# Patient Record
Sex: Male | Born: 1960 | Race: Black or African American | Hispanic: No | Marital: Single | Smoking: Never smoker
Health system: Southern US, Community
[De-identification: ages and names within clinical notes are randomized; demographics above are authoritative.]

## PROBLEM LIST (undated history)

## (undated) DIAGNOSIS — D649 Anemia, unspecified: Secondary | ICD-10-CM

## (undated) DIAGNOSIS — N1831 Chronic kidney disease, stage 3a: Secondary | ICD-10-CM

## (undated) DIAGNOSIS — I6930 Unspecified sequelae of cerebral infarction: Secondary | ICD-10-CM

## (undated) DIAGNOSIS — F1410 Cocaine abuse, uncomplicated: Secondary | ICD-10-CM

---
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
## 2006-07-17 ENCOUNTER — Emergency Department (HOSPITAL_COMMUNITY): Admission: EM | Admit: 2006-07-17 | Discharge: 2006-07-17 | Payer: Self-pay | Admitting: Emergency Medicine

## 2006-07-17 ENCOUNTER — Inpatient Hospital Stay (HOSPITAL_COMMUNITY): Admission: AD | Admit: 2006-07-17 | Discharge: 2006-07-21 | Payer: Self-pay | Admitting: Orthopedic Surgery

## 2009-10-20 ENCOUNTER — Emergency Department (HOSPITAL_COMMUNITY): Admission: EM | Admit: 2009-10-20 | Discharge: 2009-10-20 | Payer: Self-pay | Admitting: Emergency Medicine

## 2010-08-24 NOTE — Op Note (Signed)
NAMEHANSFORD, HIRT NO.:  [id number]   MEDICAL RECORD NO.:  [id number]          PATIENT TYPE:  INP   LOCATION:  3031                         FACILITY:  [facility]   PHYSICIAN:  Lenard Galloway. Mortenson, M.D.DATE OF BIRTH:  1960-05-02   DATE OF PROCEDURE:  07/17/2006  DATE OF DISCHARGE:                               OPERATIVE REPORT   PREOPERATIVE DIAGNOSIS:  Fracture left patella.   POSTOPERATIVE DIAGNOSIS:  Fracture left patella.   OPERATION:  Open induction and internal fixation fracture left patella  using Kirschner wires and figure-of-eight tension band, reduction and  stabilization of fracture.   SURGEON:  Lenard Galloway. Chaney Malling, M.D.   ASSISTANT:  Arlys John D. Petrarca, P.A.-C.   ANESTHESIA:  General.   PROCEDURE:  The patient was placed on the operating table in the supine  position with a pneumatic tourniquet on the left upper thigh.  The  entire left lower extremity was prepped with DuraPrep and draped out in  the usual manner.  The leg was wrapped out in Esmarch and the tourniquet  was elevated.  An incision was started above the patella and carried  down to the tibial tubercle.  The skin edges were retracted.  The fascia  was opened.  The patella was clearly seen.  The patella had two major  fragments and a smaller fragment on the medial side.  The wound was  irrigated and blood clots were removed.  The joint, itself, was  examined.  No loose bodies were seen.  The small fragments on the medial  side were removed as they were too small to stabilize with fixation.  An  anatomic reduction could be done under direct visualization.  Two  Kirschner wires were then passed through the proximal patellar portion  retrograde out the top of the patella.  The patella was then reduced and  pins were passed distally to capture the distal patellar component.  Once this was accomplished, the surface of the posterior aspect of the  patella were smoothed and an anatomic  duction was achieved.  A 20 gauge  wire was then passed in figure-of-eight fashion around the Kirschner  wires and with a traction bow assembly, the figure-of-eight tension band  was tightened and knots were put in the suture.  An excellent reduction  with good stabilization and excellent fixation of the fracture was  achieved.  The Kirschner wires were then cut to about 8 mm in length and  then bent packed on themselves and captured with the figure-of-eight  tension band and these were buried into the soft tissue.  Excellent  stabilization of the fracture was achieved.  The wounds were irrigated  again.  The fascia was closed with 0 Vicryl and 0 Vicryl was used to  close the subcutaneous tissue and stainless steel staples were used to  close the skin.  Sterile dressings were applied and the patient returned  to the recovery room in excellent condition.  A knee immobilizer was  applied.  Technically, this procedure went extremely well.  Drains none.  Complications none.  I was very pleased with the  final outcome.           ______________________________  Lenard Galloway. Chaney Malling, M.D.    RAM/MEDQ  D:  07/19/2006  T:  07/19/2006  Job:  [id number]
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
## 2018-12-19 ENCOUNTER — Inpatient Hospital Stay (HOSPITAL_COMMUNITY): Payer: Medicaid Other

## 2018-12-19 ENCOUNTER — Other Ambulatory Visit: Payer: Self-pay

## 2018-12-19 ENCOUNTER — Emergency Department (HOSPITAL_COMMUNITY): Payer: Medicaid Other

## 2018-12-19 ENCOUNTER — Encounter (HOSPITAL_COMMUNITY): Payer: Self-pay

## 2018-12-19 ENCOUNTER — Inpatient Hospital Stay (HOSPITAL_COMMUNITY)
Admission: EM | Admit: 2018-12-19 | Discharge: 2019-06-14 | DRG: 917 | Disposition: A | Discharge status: Skilled Nursing Facility | Payer: Medicaid Other | Attending: Internal Medicine | Admitting: Internal Medicine

## 2018-12-19 DIAGNOSIS — N3091 Cystitis, unspecified with hematuria: Secondary | ICD-10-CM | POA: Diagnosis not present

## 2018-12-19 DIAGNOSIS — Z66 Do not resuscitate: Secondary | ICD-10-CM

## 2018-12-19 DIAGNOSIS — K921 Melena: Secondary | ICD-10-CM | POA: Diagnosis not present

## 2018-12-19 DIAGNOSIS — E1122 Type 2 diabetes mellitus with diabetic chronic kidney disease: Secondary | ICD-10-CM | POA: Diagnosis present

## 2018-12-19 DIAGNOSIS — R29703 NIHSS score 3: Secondary | ICD-10-CM | POA: Diagnosis present

## 2018-12-19 DIAGNOSIS — Z7189 Other specified counseling: Secondary | ICD-10-CM | POA: Diagnosis not present

## 2018-12-19 DIAGNOSIS — G8191 Hemiplegia, unspecified affecting right dominant side: Secondary | ICD-10-CM | POA: Diagnosis present

## 2018-12-19 DIAGNOSIS — Z515 Encounter for palliative care: Secondary | ICD-10-CM

## 2018-12-19 DIAGNOSIS — I739 Peripheral vascular disease, unspecified: Secondary | ICD-10-CM | POA: Diagnosis present

## 2018-12-19 DIAGNOSIS — R4702 Dysphasia: Secondary | ICD-10-CM | POA: Diagnosis present

## 2018-12-19 DIAGNOSIS — R531 Weakness: Secondary | ICD-10-CM

## 2018-12-19 DIAGNOSIS — F1490 Cocaine use, unspecified, uncomplicated: Secondary | ICD-10-CM

## 2018-12-19 DIAGNOSIS — E872 Acidosis: Secondary | ICD-10-CM | POA: Diagnosis not present

## 2018-12-19 DIAGNOSIS — Z590 Homelessness: Secondary | ICD-10-CM

## 2018-12-19 DIAGNOSIS — F339 Major depressive disorder, recurrent, unspecified: Secondary | ICD-10-CM

## 2018-12-19 DIAGNOSIS — N485 Ulcer of penis: Secondary | ICD-10-CM | POA: Diagnosis not present

## 2018-12-19 DIAGNOSIS — F919 Conduct disorder, unspecified: Secondary | ICD-10-CM | POA: Diagnosis not present

## 2018-12-19 DIAGNOSIS — F1410 Cocaine abuse, uncomplicated: Secondary | ICD-10-CM | POA: Diagnosis present

## 2018-12-19 DIAGNOSIS — R130 Aphagia: Secondary | ICD-10-CM | POA: Diagnosis present

## 2018-12-19 DIAGNOSIS — I63322 Cerebral infarction due to thrombosis of left anterior cerebral artery: Secondary | ICD-10-CM | POA: Diagnosis present

## 2018-12-19 DIAGNOSIS — Z20822 Contact with and (suspected) exposure to covid-19: Secondary | ICD-10-CM | POA: Diagnosis present

## 2018-12-19 DIAGNOSIS — G252 Other specified forms of tremor: Secondary | ICD-10-CM | POA: Diagnosis present

## 2018-12-19 DIAGNOSIS — I452 Bifascicular block: Secondary | ICD-10-CM | POA: Diagnosis present

## 2018-12-19 DIAGNOSIS — I129 Hypertensive chronic kidney disease with stage 1 through stage 4 chronic kidney disease, or unspecified chronic kidney disease: Secondary | ICD-10-CM | POA: Diagnosis present

## 2018-12-19 DIAGNOSIS — Z659 Problem related to unspecified psychosocial circumstances: Secondary | ICD-10-CM

## 2018-12-19 DIAGNOSIS — R109 Unspecified abdominal pain: Secondary | ICD-10-CM

## 2018-12-19 DIAGNOSIS — N1831 Chronic kidney disease, stage 3a: Secondary | ICD-10-CM | POA: Diagnosis present

## 2018-12-19 DIAGNOSIS — Z751 Person awaiting admission to adequate facility elsewhere: Secondary | ICD-10-CM

## 2018-12-19 DIAGNOSIS — D631 Anemia in chronic kidney disease: Secondary | ICD-10-CM | POA: Diagnosis present

## 2018-12-19 DIAGNOSIS — I63312 Cerebral infarction due to thrombosis of left middle cerebral artery: Secondary | ICD-10-CM | POA: Diagnosis present

## 2018-12-19 DIAGNOSIS — R9431 Abnormal electrocardiogram [ECG] [EKG]: Secondary | ICD-10-CM | POA: Diagnosis present

## 2018-12-19 DIAGNOSIS — L89890 Pressure ulcer of other site, unstageable: Secondary | ICD-10-CM | POA: Diagnosis not present

## 2018-12-19 DIAGNOSIS — I63522 Cerebral infarction due to unspecified occlusion or stenosis of left anterior cerebral artery: Secondary | ICD-10-CM

## 2018-12-19 DIAGNOSIS — Y92239 Unspecified place in hospital as the place of occurrence of the external cause: Secondary | ICD-10-CM | POA: Diagnosis not present

## 2018-12-19 DIAGNOSIS — D509 Iron deficiency anemia, unspecified: Secondary | ICD-10-CM

## 2018-12-19 DIAGNOSIS — N179 Acute kidney failure, unspecified: Secondary | ICD-10-CM | POA: Diagnosis present

## 2018-12-19 DIAGNOSIS — R471 Dysarthria and anarthria: Secondary | ICD-10-CM | POA: Diagnosis present

## 2018-12-19 DIAGNOSIS — T8383XA Hemorrhage of genitourinary prosthetic devices, implants and grafts, initial encounter: Secondary | ICD-10-CM | POA: Diagnosis not present

## 2018-12-19 DIAGNOSIS — E441 Mild protein-calorie malnutrition: Secondary | ICD-10-CM | POA: Diagnosis not present

## 2018-12-19 DIAGNOSIS — T405X1A Poisoning by cocaine, accidental (unintentional), initial encounter: Secondary | ICD-10-CM | POA: Diagnosis present

## 2018-12-19 DIAGNOSIS — I6523 Occlusion and stenosis of bilateral carotid arteries: Secondary | ICD-10-CM | POA: Diagnosis present

## 2018-12-19 DIAGNOSIS — E785 Hyperlipidemia, unspecified: Secondary | ICD-10-CM | POA: Diagnosis present

## 2018-12-19 DIAGNOSIS — L309 Dermatitis, unspecified: Secondary | ICD-10-CM | POA: Diagnosis not present

## 2018-12-19 DIAGNOSIS — M6281 Muscle weakness (generalized): Secondary | ICD-10-CM | POA: Diagnosis not present

## 2018-12-19 DIAGNOSIS — R339 Retention of urine, unspecified: Secondary | ICD-10-CM | POA: Clinically undetermined

## 2018-12-19 DIAGNOSIS — Z5320 Procedure and treatment not carried out because of patient's decision for unspecified reasons: Secondary | ICD-10-CM | POA: Diagnosis not present

## 2018-12-19 DIAGNOSIS — R140 Abdominal distension (gaseous): Secondary | ICD-10-CM | POA: Diagnosis present

## 2018-12-19 DIAGNOSIS — R509 Fever, unspecified: Secondary | ICD-10-CM

## 2018-12-19 DIAGNOSIS — D62 Acute posthemorrhagic anemia: Secondary | ICD-10-CM | POA: Diagnosis not present

## 2018-12-19 DIAGNOSIS — R319 Hematuria, unspecified: Secondary | ICD-10-CM

## 2018-12-19 DIAGNOSIS — I10 Essential (primary) hypertension: Secondary | ICD-10-CM

## 2018-12-19 DIAGNOSIS — M62838 Other muscle spasm: Secondary | ICD-10-CM | POA: Diagnosis not present

## 2018-12-19 DIAGNOSIS — Z6841 Body Mass Index (BMI) 40.0 and over, adult: Secondary | ICD-10-CM | POA: Diagnosis not present

## 2018-12-19 DIAGNOSIS — Y846 Urinary catheterization as the cause of abnormal reaction of the patient, or of later complication, without mention of misadventure at the time of the procedure: Secondary | ICD-10-CM | POA: Diagnosis not present

## 2018-12-19 DIAGNOSIS — F329 Major depressive disorder, single episode, unspecified: Secondary | ICD-10-CM | POA: Diagnosis not present

## 2018-12-19 DIAGNOSIS — Z7401 Bed confinement status: Secondary | ICD-10-CM

## 2018-12-19 DIAGNOSIS — E7800 Pure hypercholesterolemia, unspecified: Secondary | ICD-10-CM | POA: Diagnosis present

## 2018-12-19 DIAGNOSIS — K5900 Constipation, unspecified: Secondary | ICD-10-CM | POA: Diagnosis not present

## 2018-12-19 DIAGNOSIS — K429 Umbilical hernia without obstruction or gangrene: Secondary | ICD-10-CM | POA: Diagnosis present

## 2018-12-19 DIAGNOSIS — R4781 Slurred speech: Secondary | ICD-10-CM | POA: Diagnosis present

## 2018-12-19 DIAGNOSIS — D638 Anemia in other chronic diseases classified elsewhere: Secondary | ICD-10-CM | POA: Diagnosis present

## 2018-12-19 DIAGNOSIS — W1830XA Fall on same level, unspecified, initial encounter: Secondary | ICD-10-CM | POA: Diagnosis present

## 2018-12-19 DIAGNOSIS — T83518A Infection and inflammatory reaction due to other urinary catheter, initial encounter: Secondary | ICD-10-CM | POA: Diagnosis not present

## 2018-12-19 DIAGNOSIS — Z7289 Other problems related to lifestyle: Secondary | ICD-10-CM

## 2018-12-19 DIAGNOSIS — T45515A Adverse effect of anticoagulants, initial encounter: Secondary | ICD-10-CM | POA: Diagnosis not present

## 2018-12-19 DIAGNOSIS — R1084 Generalized abdominal pain: Secondary | ICD-10-CM

## 2018-12-19 DIAGNOSIS — I6330 Cerebral infarction due to thrombosis of unspecified cerebral artery: Secondary | ICD-10-CM | POA: Diagnosis not present

## 2018-12-19 DIAGNOSIS — R7989 Other specified abnormal findings of blood chemistry: Secondary | ICD-10-CM

## 2018-12-19 DIAGNOSIS — N390 Urinary tract infection, site not specified: Secondary | ICD-10-CM

## 2018-12-19 DIAGNOSIS — I959 Hypotension, unspecified: Secondary | ICD-10-CM | POA: Diagnosis not present

## 2018-12-19 DIAGNOSIS — I639 Cerebral infarction, unspecified: Secondary | ICD-10-CM

## 2018-12-19 DIAGNOSIS — R3911 Hesitancy of micturition: Secondary | ICD-10-CM

## 2018-12-19 LAB — DIFFERENTIAL
Abs Immature Granulocytes: 0.05 10*3/uL (ref 0.00–0.07)
Basophils Absolute: 0 10*3/uL (ref 0.0–0.1)
Basophils Relative: 0 %
Eosinophils Absolute: 0 10*3/uL (ref 0.0–0.5)
Eosinophils Relative: 0 %
Immature Granulocytes: 1 %
Lymphocytes Relative: 21 %
Lymphs Abs: 1.8 10*3/uL (ref 0.7–4.0)
Monocytes Absolute: 0.5 10*3/uL (ref 0.1–1.0)
Monocytes Relative: 6 %
Neutro Abs: 6.3 10*3/uL (ref 1.7–7.7)
Neutrophils Relative %: 72 %

## 2018-12-19 LAB — CBC
HCT: 43.3 % (ref 39.0–52.0)
Hemoglobin: 14.7 g/dL (ref 13.0–17.0)
MCH: 30.1 pg (ref 26.0–34.0)
MCHC: 33.9 g/dL (ref 30.0–36.0)
MCV: 88.5 fL (ref 80.0–100.0)
Platelets: 228 10*3/uL (ref 150–400)
RBC: 4.89 MIL/uL (ref 4.22–5.81)
RDW: 13 % (ref 11.5–15.5)
WBC: 8.7 10*3/uL (ref 4.0–10.5)
nRBC: 0 % (ref 0.0–0.2)

## 2018-12-19 LAB — ETHANOL: Alcohol, Ethyl (B): <10 mg/dL (ref <10)

## 2018-12-19 LAB — I-STAT CHEM 8, ED
BUN: 20 mg/dL (ref 6–20)
Calcium, Ion: 1.16 mmol/L (ref 1.15–1.40)
Chloride: 113 mmol/L — ABNORMAL HIGH (ref 98–111)
Creatinine, Ser: 1.9 mg/dL — ABNORMAL HIGH (ref 0.61–1.24)
Glucose, Bld: 102 mg/dL — ABNORMAL HIGH (ref 70–99)
HCT: 42 % (ref 39.0–52.0)
Hemoglobin: 14.3 g/dL (ref 13.0–17.0)
Potassium: 4 mmol/L (ref 3.5–5.1)
Sodium: 143 mmol/L (ref 135–145)
TCO2: 21 mmol/L — ABNORMAL LOW (ref 22–32)

## 2018-12-19 LAB — PROTIME-INR
INR: 1 (ref 0.8–1.2)
Prothrombin Time: 12.7 seconds (ref 11.4–15.2)

## 2018-12-19 LAB — URINALYSIS, ROUTINE W REFLEX MICROSCOPIC
Bilirubin Urine: NEGATIVE
Glucose, UA: NEGATIVE mg/dL
Hgb urine dipstick: NEGATIVE
Ketones, ur: NEGATIVE mg/dL
Leukocytes,Ua: NEGATIVE
Nitrite: NEGATIVE
Protein, ur: NEGATIVE mg/dL
Specific Gravity, Urine: 1.014 (ref 1.005–1.030)
pH: 5 (ref 5.0–8.0)

## 2018-12-19 LAB — COMPREHENSIVE METABOLIC PANEL
ALT: 35 U/L (ref 0–44)
AST: 34 U/L (ref 15–41)
Albumin: 4 g/dL (ref 3.5–5.0)
Alkaline Phosphatase: 68 U/L (ref 38–126)
Anion gap: 9 (ref 5–15)
BUN: 17 mg/dL (ref 6–20)
CO2: 21 mmol/L — ABNORMAL LOW (ref 22–32)
Calcium: 9.3 mg/dL (ref 8.9–10.3)
Chloride: 112 mmol/L — ABNORMAL HIGH (ref 98–111)
Creatinine, Ser: 1.84 mg/dL — ABNORMAL HIGH (ref 0.61–1.24)
GFR calc Af Amer: 46 mL/min — ABNORMAL LOW (ref >60)
GFR calc non Af Amer: 40 mL/min — ABNORMAL LOW (ref >60)
Glucose, Bld: 105 mg/dL — ABNORMAL HIGH (ref 70–99)
Potassium: 4 mmol/L (ref 3.5–5.1)
Sodium: 142 mmol/L (ref 135–145)
Total Bilirubin: 0.6 mg/dL (ref 0.3–1.2)
Total Protein: 7.2 g/dL (ref 6.5–8.1)

## 2018-12-19 LAB — APTT: aPTT: 24 seconds (ref 24–36)

## 2018-12-19 LAB — SARS CORONAVIRUS 2 BY RT PCR (HOSPITAL ORDER, PERFORMED IN ~~LOC~~ HOSPITAL LAB): SARS Coronavirus 2: NEGATIVE

## 2018-12-19 LAB — RAPID URINE DRUG SCREEN, HOSP PERFORMED
Amphetamines: NOT DETECTED
Barbiturates: NOT DETECTED
Benzodiazepines: NOT DETECTED
Cocaine: POSITIVE — AB
Opiates: NOT DETECTED
Tetrahydrocannabinol: NOT DETECTED

## 2018-12-19 LAB — LIPID PANEL
Cholesterol: 273 mg/dL — ABNORMAL HIGH (ref 0–200)
HDL: 57 mg/dL (ref >40)
LDL Cholesterol: 199 mg/dL — ABNORMAL HIGH (ref 0–99)
Total CHOL/HDL Ratio: 4.8 RATIO
Triglycerides: 84 mg/dL (ref <150)
VLDL: 17 mg/dL (ref 0–40)

## 2018-12-19 LAB — TROPONIN I (HIGH SENSITIVITY)
Troponin I (High Sensitivity): 45 ng/L — ABNORMAL HIGH (ref <18)
Troponin I (High Sensitivity): 35 ng/L — ABNORMAL HIGH (ref <18)

## 2018-12-19 LAB — CBG MONITORING, ED: Glucose-Capillary: 92 mg/dL (ref 70–99)

## 2018-12-19 MED ORDER — ENOXAPARIN SODIUM 40 MG/0.4ML ~~LOC~~ SOLN
40.0000 mg | SUBCUTANEOUS | Status: DC
  Administered 2018-12-20: 40 mg via SUBCUTANEOUS
  Filled 2018-12-19: qty 0.4

## 2018-12-19 MED ORDER — ROSUVASTATIN CALCIUM 20 MG PO TABS: 20.0000 mg | ORAL_TABLET | Freq: Every day | ORAL | Status: DC

## 2018-12-19 MED ORDER — LACTATED RINGERS IV BOLUS
1000.0000 mL | Freq: Once | INTRAVENOUS | Status: AC
  Administered 2018-12-19: 1000 mL via INTRAVENOUS

## 2018-12-19 MED ORDER — ACETAMINOPHEN 325 MG PO TABS
650.0000 mg | ORAL_TABLET | Freq: Four times a day (QID) | ORAL | Status: DC | PRN
  Administered 2018-12-20 – 2019-04-12 (×20): 650 mg via ORAL
  Filled 2018-12-19 (×21): qty 2

## 2018-12-19 MED ORDER — LACTATED RINGERS IV SOLN
INTRAVENOUS | Status: DC
  Administered 2018-12-19 – 2018-12-20 (×3): via INTRAVENOUS

## 2018-12-19 MED ORDER — ASPIRIN EC 81 MG PO TBEC
81.0000 mg | DELAYED_RELEASE_TABLET | Freq: Every day | ORAL | Status: DC
  Administered 2018-12-20 – 2019-01-05 (×17): 81 mg via ORAL
  Filled 2018-12-19 (×17): qty 1

## 2018-12-19 MED ORDER — ASPIRIN 325 MG PO TABS
325.0000 mg | ORAL_TABLET | Freq: Once | ORAL | Status: AC
  Administered 2018-12-19: 325 mg via ORAL
  Filled 2018-12-19: qty 1

## 2018-12-19 MED ORDER — ACETAMINOPHEN 650 MG RE SUPP: 650.0000 mg | Freq: Four times a day (QID) | RECTAL | Status: DC | PRN

## 2018-12-19 NOTE — ED Notes (Signed)
Patient transported to X-ray 

## 2018-12-19 NOTE — ED Triage Notes (Signed)
Pt from home via ems; found on floor of home by fmaily member ; claims symptom onset 3 am, LSN 12 am; c/o L sided weakness, difficulty standing, difficulty holding left arm steady; minor intemittent aphasia; denies medical hx; pt states he stumbled to floor, denies head, neck back pain; denies hitting head, no loc; LVO 3 with EMS; not on thinners; a and o x 4  CBG 104 197/121 02 95 RA HR 85 97.5 F

## 2018-12-19 NOTE — ED Notes (Signed)
ED TO INPATIENT HANDOFF REPORT  ED Nurse Name and Phone #:   S Name/Age/Gender Iona CoachLeroy Brannigan 58 y.o. male Room/Bed: 013C/013C  Code Status   Code Status: Full Code  Home/SNF/Other Home Patient oriented to: self, place, time and situation Is this baseline? No   Triage Complete: Triage complete  Chief Complaint weakness  Triage Note Pt from home via ems; found on floor of home by fmaily member ; claims symptom onset 3 am, LSN 12 am; c/o L sided weakness, difficulty standing, difficulty holding left arm steady; minor intemittent aphasia; denies medical hx; pt states he stumbled to floor, denies head, neck back pain; denies hitting head, no loc; LVO 3 with EMS; not on thinners; a and o x 4  CBG 104 197/121 02 95 RA HR 85 97.5 F   Allergies No Known Allergies  Level of Care/Admitting Diagnosis ED Disposition    ED Disposition Condition Comment   Admit  [facility] [[id number]]  Level of Care: Telemetry Medical [104]  Covid Evaluation: Asymptomatic Screening Protocol (No Symptoms)  Diagnosis: Acute right-sided weakness [191478][[id number]]  Admitting Physician: Nena PolioMULLEN, EMILY B [[id number]]  Attending Physician: Nena PolioMULLEN, EMILY B [[id number]]  Estimated length of stay: past midnight tomorrow  Certification:: I certify this patient will need inpatient services for at least 2 midnights  PT Class (Do Not Modify): Inpatient [101]  PT Acc Code (Do Not Modify): Private [1]       B Medical/Surgery History History reviewed. No pertinent past medical history. History reviewed. No pertinent surgical history.   A IV Location/Drains/Wounds Patient Lines/Drains/Airways Status   Active Line/Drains/Airways    Name:   Placement date:   Placement time:   Site:   Days:   Peripheral IV 12/19/18 Left Antecubital   12/19/18    -    Antecubital   less than 1          Intake/Output Last 24 hours  Intake/Output Summary (Last 24 hours) at 12/19/2018 1814 Last data filed at  12/19/2018 1518 Gross per 24 hour  Intake 1000 ml  Output -  Net 1000 ml    Labs/Imaging Results for orders placed or performed during the hospital encounter of 12/19/18 (from the past 48 hour(s))  Protime-INR     Status: None   Collection Time: 12/19/18  8:07 AM  Result Value Ref Range   Prothrombin Time 12.7 11.4 - 15.2 seconds   INR 1.0 0.8 - 1.2    Comment: (NOTE) INR goal varies based on device and disease states. Performed at [facility], [address] [place], [place] [address]   APTT     Status: None   Collection Time: 12/19/18  8:07 AM  Result Value Ref Range   aPTT 24 24 - 36 seconds    Comment: Performed at [facility], [address] [place], [place] [address]  CBC     Status: None   Collection Time: 12/19/18  8:07 AM  Result Value Ref Range   WBC 8.7 4.0 - 10.5 K/uL   RBC 4.89 4.22 - 5.81 MIL/uL   Hemoglobin 14.7 13.0 - 17.0 g/dL   HCT 57.843.3 46.939.0 - 62.952.0 %   MCV 88.5 80.0 - 100.0 fL   MCH 30.1 26.0 - 34.0 pg   MCHC 33.9 30.0 - 36.0 g/dL   RDW 52.813.0 41.311.5 - 24.415.5 %   Platelets 228 150 - 400 K/uL   nRBC 0.0 0.0 - 0.2 %    Comment:  Performed at [facility], [address] [place], [place] [address]  Differential     Status: None   Collection Time: 12/19/18  8:07 AM  Result Value Ref Range   Neutrophils Relative % 72 %   Neutro Abs 6.3 1.7 - 7.7 K/uL   Lymphocytes Relative 21 %   Lymphs Abs 1.8 0.7 - 4.0 K/uL   Monocytes Relative 6 %   Monocytes Absolute 0.5 0.1 - 1.0 K/uL   Eosinophils Relative 0 %   Eosinophils Absolute 0.0 0.0 - 0.5 K/uL   Basophils Relative 0 %   Basophils Absolute 0.0 0.0 - 0.1 K/uL   Immature Granulocytes 1 %   Abs Immature Granulocytes 0.05 0.00 - 0.07 K/uL    Comment: Performed at [facility], [address], [place], [place] [address]  Comprehensive metabolic panel     Status: Abnormal   Collection Time: 12/19/18  8:07 AM  Result Value Ref Range   Sodium 142 135 - 145 mmol/L   Potassium 4.0 3.5 -  5.1 mmol/L   Chloride 112 (H) 98 - 111 mmol/L   CO2 21 (L) 22 - 32 mmol/L   Glucose, Bld 105 (H) 70 - 99 mg/dL   BUN 17 6 - 20 mg/dL   Creatinine, Ser 0.63 (H) 0.61 - 1.24 mg/dL   Calcium 9.3 8.9 - 01.6 mg/dL   Total Protein 7.2 6.5 - 8.1 g/dL   Albumin 4.0 3.5 - 5.0 g/dL   AST 34 15 - 41 U/L   ALT 35 0 - 44 U/L   Alkaline Phosphatase 68 38 - 126 U/L   Total Bilirubin 0.6 0.3 - 1.2 mg/dL   GFR calc non Af Amer 40 (L) >60 mL/min   GFR calc Af Amer 46 (L) >60 mL/min   Anion gap 9 5 - 15    Comment: Performed at [facility], [address], [place], [place] [address]  CBG monitoring, ED     Status: None   Collection Time: 12/19/18  8:08 AM  Result Value Ref Range   Glucose-Capillary 92 70 - 99 mg/dL  I-stat chem 8, ED     Status: Abnormal   Collection Time: 12/19/18  8:15 AM  Result Value Ref Range   Sodium 143 135 - 145 mmol/L   Potassium 4.0 3.5 - 5.1 mmol/L   Chloride 113 (H) 98 - 111 mmol/L   BUN 20 6 - 20 mg/dL   Creatinine, Ser 2.35 (H) 0.61 - 1.24 mg/dL   Glucose, Bld 573 (H) 70 - 99 mg/dL   Calcium, Ion 2.20 2.54 - 1.40 mmol/L   TCO2 21 (L) 22 - 32 mmol/L   Hemoglobin 14.3 13.0 - 17.0 g/dL   HCT 27.0 62.3 - 76.2 %  SARS Coronavirus 2 [facility] order, Performed in [facility] hospital lab) Nasopharyngeal Nasopharyngeal Swab     Status: None   Collection Time: 12/19/18 10:25 AM   Specimen: Nasopharyngeal Swab  Result Value Ref Range   SARS Coronavirus 2 NEGATIVE NEGATIVE    Comment: (NOTE) If result is NEGATIVE SARS-CoV-2 target nucleic acids are NOT DETECTED. The SARS-CoV-2 RNA is generally detectable in upper and lower  respiratory specimens during the acute phase of infection. The lowest  concentration of SARS-CoV-2 viral copies this assay can detect is 250  copies / mL. A negative result does not preclude SARS-CoV-2 infection  and should not be used as the sole basis for treatment or other  patient management decisions.  A negative result may occur with   improper specimen collection / handling, submission of specimen other  than nasopharyngeal swab, presence of viral mutation(s) within the  areas targeted by this assay, and inadequate number of viral copies  (<250 copies / mL). A negative result must be combined with clinical  observations, patient history, and epidemiological information. If result is POSITIVE SARS-CoV-2 target nucleic acids are DETECTED. The SARS-CoV-2 RNA is generally detectable in upper and lower  respiratory specimens dur ing the acute phase of infection.  Positive  results are indicative of active infection with SARS-CoV-2.  Clinical  correlation with patient history and other diagnostic information is  necessary to determine patient infection status.  Positive results do  not rule out bacterial infection or co-infection with other viruses. If result is PRESUMPTIVE POSTIVE SARS-CoV-2 nucleic acids MAY BE PRESENT.   A presumptive positive result was obtained on the submitted specimen  and confirmed on repeat testing.  While 2019 novel coronavirus  (SARS-CoV-2) nucleic acids may be present in the submitted sample  additional confirmatory testing may be necessary for epidemiological  and / or clinical management purposes  to differentiate between  SARS-CoV-2 and other Sarbecovirus currently known to infect humans.  If clinically indicated additional testing with an alternate test  methodology [phone]) is advised. The SARS-CoV-2 RNA is generally  detectable in upper and lower respiratory sp ecimens during the acute  phase of infection. The expected result is Negative. Fact Sheet for Patients:  BoilerBrush.com.cy Fact Sheet for Healthcare Providers: https://pope.com/ This test is not yet approved or cleared by the [place] FDA and has been authorized for detection and/or diagnosis of SARS-CoV-2 by FDA under an Emergency Use Authorization (EUA).  This EUA will  remain in effect (meaning this test can be used) for the duration of the COVID-19 declaration under Section 564(b)(1) of the Act, 21 U.S.C. section 360bbb-3(b)(1), unless the authorization is terminated or revoked sooner. Performed at [facility], [address] [place], [place] [address]   Urine rapid drug screen (hosp performed)     Status: Abnormal   Collection Time: 12/19/18 10:42 AM  Result Value Ref Range   Opiates NONE DETECTED NONE DETECTED   Cocaine POSITIVE (A) NONE DETECTED   Benzodiazepines NONE DETECTED NONE DETECTED   Amphetamines NONE DETECTED NONE DETECTED   Tetrahydrocannabinol NONE DETECTED NONE DETECTED   Barbiturates NONE DETECTED NONE DETECTED    Comment: (NOTE) DRUG SCREEN FOR MEDICAL PURPOSES ONLY.  IF CONFIRMATION IS NEEDED FOR ANY PURPOSE, NOTIFY LAB WITHIN 5 DAYS. LOWEST DETECTABLE LIMITS FOR URINE DRUG SCREEN Drug Class                     Cutoff (ng/mL) Amphetamine and metabolites    1000 Barbiturate and metabolites    200 Benzodiazepine                 200 Tricyclics and metabolites     300 Opiates and metabolites        300 Cocaine and metabolites        300 THC                            50 Performed at [facility], [address] [place], [place] [address]   Urinalysis, Routine w reflex microscopic     Status: None   Collection Time: 12/19/18 10:43 AM  Result Value Ref Range   Color,  Urine YELLOW YELLOW   APPearance CLEAR CLEAR   Specific Gravity, Urine 1.014 1.005 - 1.030   pH 5.0 5.0 - 8.0   Glucose, UA NEGATIVE NEGATIVE mg/dL   Hgb urine dipstick NEGATIVE NEGATIVE   Bilirubin Urine NEGATIVE NEGATIVE   Ketones, ur NEGATIVE NEGATIVE mg/dL   Protein, ur NEGATIVE NEGATIVE mg/dL   Nitrite NEGATIVE NEGATIVE   Leukocytes,Ua NEGATIVE NEGATIVE    Comment: Performed at [facility], [address] [place], [place] [address]   Dg Abd 1 View  Result Date: 12/19/2018 CLINICAL DATA:  Acute abdominal pain and distension.  EXAM: ABDOMEN - 1 VIEW COMPARISON:  None. FINDINGS: The bowel gas pattern is unremarkable. No dilated bowel loops are present. No suspicious calcifications are noted. Calcifications in the anatomic pelvis likely represent phleboliths. IMPRESSION: Unremarkable bowel gas pattern.  No acute abnormality. Electronically Signed   By: Harmon PierJeffrey  Hu M.D.   On: 12/19/2018 15:08   Ct Head Wo Contrast  Result Date: 12/19/2018 CLINICAL DATA:  58 year old male with right-sided weakness. Found down on the floor by family member. EXAM: CT HEAD WITHOUT CONTRAST TECHNIQUE: Contiguous axial images were obtained from the base of the skull through the vertex without intravenous contrast. COMPARISON:  None. FINDINGS: Brain: Patchy and confluent areas of decreased attenuation are noted throughout the deep and periventricular white matter of the cerebral hemispheres bilaterally, compatible with chronic microvascular ischemic disease. No evidence of acute infarction, hemorrhage, hydrocephalus, extra-axial collection or mass lesion/mass effect. Vascular: No hyperdense vessel or unexpected calcification. Skull: Normal. Negative for fracture or focal lesion. Sinuses/Orbits: No acute finding. Small mucosal retention cyst or polyp in the posterior aspect of the left maxillary sinus. Other: None. IMPRESSION: 1. No acute intracranial abnormalities. 2. Chronic microvascular ischemic changes in the cerebral white matter, as above. Electronically Signed   By: Trudie Reedaniel  Entrikin M.D.   On: 12/19/2018 09:41    Pending Labs Unresulted Labs (From admission, onward)    Start     Ordered   12/20/18 0500  Basic metabolic panel  Tomorrow morning,   R     12/19/18 1140   12/20/18 0500  CBC  Tomorrow morning,   R     12/19/18 1140   12/19/18 1421  Lipid panel  Once,   STAT     12/19/18 1421   12/19/18 1421  Hemoglobin A1c  Once,   STAT     12/19/18 1421   12/19/18 1139  HIV antibody (Routine Testing)  Once,   STAT     12/19/18 1140   12/19/18  0756  Ethanol  ONCE - STAT,   STAT     12/19/18 0755          Vitals/Pain Today's Vitals   12/19/18 1100 12/19/18 1145 12/19/18 1400 12/19/18 1500  BP: (!) 177/107 (!) 147/115 (!) 152/130 (!) 184/117  Pulse: 78 73 71 73  Resp: 19 (!) 23 12 (!) 21  Temp:      TempSrc:      SpO2: 98% 96% 92% 99%  Weight:      Height:      PainSc:        Isolation Precautions No active isolations  Medications Medications  enoxaparin (LOVENOX) injection 40 mg (has no administration in time range)  acetaminophen (TYLENOL) tablet 650 mg (has no administration in time range)    Or  acetaminophen (TYLENOL) suppository 650 mg (has no administration in time range)  aspirin tablet 325 mg (325 mg Oral Given 12/19/18 1209)  Followed by  aspirin EC tablet 81 mg (has no administration in time range)  lactated ringers bolus 1,000 mL (0 mLs Intravenous Stopped 12/19/18 1518)    Followed by  lactated ringers infusion ( Intravenous New Bag/Given 12/19/18 1519)  rosuvastatin (CRESTOR) tablet 20 mg (has no administration in time range)    Mobility walks Low fall risk   Focused Assessments     R Recommendations: See Admitting Provider Note  Report given to:   Additional Notes:

## 2018-12-19 NOTE — ED Notes (Signed)
Patient transported to CT 

## 2018-12-19 NOTE — Consult Note (Addendum)
NEURO HOSPITALIST  CONSULT   Requesting Physician: Dr. Criselda PeachesMullen    Chief Complaint: left side weakness  History obtained from:  Patient    HPI:                                                                                                                                         Iona CoachLeroy Eveland is an 58 y.o. male with no PMH who presented to [facility] ED via EMS for  left side weakness and numbness.  Patient was in his usual state of health at midnight when he went to bed.  Admits to doing cocaine last night.  About 0300 he woke up and got out of bed, at this time he noted some weakness and fell to the floor. He stayed on the floor for about 2 hours before he could get assistance to get up. EMS helped him up. Per patient he could not get off the floor, but somehow when his mom called ( he lives with his mother), he was able to get to the phone. He told her that he was on the floor and she called EMS.  Per EMS patient had difficulty standing and some speech problems with left sided weakness. Patient denies hitting head, loss of consciousness anticoagulation use, dizziness, room spinning, tingling, vision changes. Patient endorses smoking 3 crack rocks per day, and drinking 3 beers per day. Denies tobacco abuse and no other drug use. No prior stroke history. Denies family history of stroke. Denies DM, HTN, cardiac problems, CP, SOB.   ED course:  CTH: no hemorrhage UDS; + cocaine BG: 102 BP: 147/115 UA: neagative Creatinine: 1.9     Date last known well: 12/19/2018 Time last known well: 0000 tPA Given: {no; outside of window Modified Rankin: Rankin Score=0 NIHSS:3, right leg   History reviewed. No pertinent past medical history.  History reviewed. No pertinent surgical history.  No family history on file.       Social History:  reports that he has never smoked. He has never used smokeless tobacco. He reports current alcohol use. He reports that  he does not use drugs.  Allergies: No Known Allergies  Medications:  Scheduled: . aspirin  325 mg Oral Once   Followed by  . [START ON 12/20/2018] aspirin EC  81 mg Oral Daily  . [START ON 12/20/2018] enoxaparin (LOVENOX) injection  40 mg Subcutaneous Q24H   Continuous: . lactated ringers     Followed by  . lactated ringers     [id number] **OR** acetaminophen  ROS:                                                                                                                                       ROS was performed and is negative except as noted in HPI  General Examination:                                                                                                      Blood pressure (!) 147/115, pulse 73, temperature 99 F (37.2 C), temperature source Oral, resp. rate (!) 23, height 5\' 9"  (1.753 m), weight 102.1 kg, SpO2 96 %.  HEENT-  Normocephalic, no lesions, without obvious abnormality.  Normal external eye and conjunctiva. Cardiovascular- S1-S2 audible, pulses palpable throughout  Lungs-no rhonchi or wheezing noted, no excessive working breathing.  Saturations within normal limits on RA Abdomen- All 4 quadrants palpated and non tender Extremities- Warm, dry and intact Musculoskeletal-no joint tenderness, deformity or swelling Skin-warm and dry, no hyperpigmentation, vitiligo, or suspicious lesions  Neurological Examination Mental Status: drowsy, oriented, thought content appropriate. Naming intact Speech: dysarthria without evidence of aphasia.  Able to follow  commands without difficulty. Cranial Nerves: II: Visual fields grossly normal,  III,IV, VI: ptosis not present, extra-ocular motions intact bilaterally, pupils equal, round, reactive to light and accommodation V,VII: smile symmetric, facial light touch sensation normal  bilaterally VIII: hearing normal bilaterally IX,X: uvula rises midline XI: bilateral shoulder shrug XII: midline tongue extension Motor: Right : Upper extremity   5/5  Left:     Upper extremity   5/5  Lower extremity   2/5   Lower extremity   5/5 Right side is weaker than left. Tone and bulk:normal tone throughout; no atrophy noted Sensory:  light touch intact throughout, bilaterally Deep Tendon Reflexes: 2+ and symmetric biceps and patella Cerebellar: normal finger-to-nose,HTS on left leg non ataxic. Unable to perform with right leg Gait: deferred   Lab Results: Basic Metabolic Panel: Recent Labs  Lab 12/19/18 0807 12/19/18 0815  NA 142 143  K 4.0 4.0  CL 112* 113*  CO2 21*  --   GLUCOSE 105* 102*  BUN  17 20  CREATININE 1.84* 1.90*  CALCIUM 9.3  --     CBC: Recent Labs  Lab 12/19/18 0807 12/19/18 0815  WBC 8.7  --   NEUTROABS 6.3  --   HGB 14.7 14.3  HCT 43.3 42.0  MCV 88.5  --   PLT 228  --     CBG: Recent Labs  Lab 12/19/18 0808  GLUCAP 92    Imaging: Ct Head Wo Contrast  Result Date: 12/19/2018 CLINICAL DATA:  58 year old male with right-sided weakness. Found down on the floor by family member. EXAM: CT HEAD WITHOUT CONTRAST TECHNIQUE: Contiguous axial images were obtained from the base of the skull through the vertex without intravenous contrast. COMPARISON:  None. FINDINGS: Brain: Patchy and confluent areas of decreased attenuation are noted throughout the deep and periventricular white matter of the cerebral hemispheres bilaterally, compatible with chronic microvascular ischemic disease. No evidence of acute infarction, hemorrhage, hydrocephalus, extra-axial collection or mass lesion/mass effect. Vascular: No hyperdense vessel or unexpected calcification. Skull: Normal. Negative for fracture or focal lesion. Sinuses/Orbits: No acute finding. Small mucosal retention cyst or polyp in the posterior aspect of the left maxillary sinus. Other: None. IMPRESSION:  1. No acute intracranial abnormalities. 2. Chronic microvascular ischemic changes in the cerebral white matter, as above. Electronically Signed   By: Vinnie Langton M.D.   On: 12/19/2018 09:41     Laurey Morale, MSN, NP-C [facility] [phone]  12/19/2018, 12:03 PM   Attending physician note to follow with Assessment and plan .   Attending addendum Patient seen and examined Concern for right leg weakness is what brought him into the ER. Positive for cocaine on the urinary toxicology screen Small vessel stroke versus toxic metabolic encephalopathy from drug use Has many risk factors for stroke-needs work-up. CT head with no acute changes.  I have personally reviewed the CT scan.  Assessment: 58 y.o. male with no PMH who presented to [facility] ED via EMS for  left side weakness and numbness. CTH no hemorrhage. On exam patient unable to  Move right leg off the bed, but states weakness and numbness is on the left side. TPA: not given presented outside the window.NIHSS 3, admit for complete stroke work up.  Stroke Risk Factors - none       Recommendations: -- BP goal : Permissive HTN upto 220/110 mmHg (for 24-48 post admission )  --MRI Brain  --CTA  --Echocardiogram -- Prophylactic therapy-Antiplatelet med -- High intensity Statin if LDL > 70 -- HgbA1c, fasting lipid panel -- PT consult, OT consult, Speech consult --Telemetry monitoring --Frequent neuro checks --Stroke swallow screen   --please page stroke NP  Or  PA  Or MD from 8am -4 pm  as this patient from this time will be  followed by the stroke.   You can look them up on [email or web address]  Password [email or web address]  -- Amie Portland, MD [facility] Pager: [phone] If 7pm to 7am, please call on call as listed on AMION.

## 2018-12-19 NOTE — ED Provider Notes (Signed)
[facility] EMERGENCY DEPARTMENT Provider Note   CSN: [id number] Arrival date & time: 12/19/18  0745     History   Chief Complaint No chief complaint on file.   HPI Philip Vasquez is a 58 y.o. male.     58 year old male who presents with weakness.  Patient was in his usual state of health when he went to sleep around midnight.  This morning at 3 AM he woke up and got out of bed, noted weakness and fell to the ground.  He laid there for a while before he was able to get assistance.  He reportedly had difficulty standing and EMS reported left-sided weakness and some intermittent speech problems.  He denied any head injury or loss of consciousness.  He denies any anticoagulant use.  Patient denies any drug or alcohol problems and denies any recent illness.  The history is provided by the patient.    History reviewed. No pertinent past medical history.  There are no active problems to display for this patient.   History reviewed. No pertinent surgical history.      Home Medications    Prior to Admission medications   Not on File    Family History No family history on file.  Social History Social History   Tobacco Use  . Smoking status: Never Smoker  . Smokeless tobacco: Never Used  Substance Use Topics  . Alcohol use: Yes    Comment: 2 cans of beer (natural lite)/day  . Drug use: Never     Allergies   Patient has no known allergies.   Review of Systems Review of Systems All other systems reviewed and are negative except that which was mentioned in HPI   Physical Exam Updated Vital Signs BP (!) 177/107   Pulse 78   Temp 99 F (37.2 C) (Oral)   Resp 19   Ht 5\' 9"  (1.753 m)   Wt 102.1 kg   SpO2 98%   BMI 33.23 kg/m   Physical Exam Vitals signs and nursing note reviewed.  Constitutional:      General: He is not in acute distress.    Appearance: He is well-developed.     Comments: Sleepy but arousable  HENT:     Head:  Normocephalic and atraumatic.  Eyes:     Extraocular Movements: Extraocular movements intact.     Conjunctiva/sclera: Conjunctivae normal.     Pupils: Pupils are equal, round, and reactive to light.  Neck:     Musculoskeletal: Neck supple.  Cardiovascular:     Rate and Rhythm: Normal rate and regular rhythm.     Heart sounds: Normal heart sounds. No murmur.  Pulmonary:     Effort: Pulmonary effort is normal. No respiratory distress.     Breath sounds: Normal breath sounds.  Abdominal:     General: Bowel sounds are normal. There is no distension.     Palpations: Abdomen is soft.     Tenderness: There is no abdominal tenderness.  Skin:    General: Skin is warm and dry.  Neurological:     Mental Status: He is alert and oriented to person, place, and time.     Cranial Nerves: No cranial nerve deficit.     Motor: No abnormal muscle tone.     Deep Tendon Reflexes: Reflexes are normal and symmetric.     Comments: No obvious facial asymmetry and fluent speech, proper naming of objects and no obvious aphasia, normal finger-to-nose testing, negative pronator drift, no clonus  4/5 strength RUE, RLE 5/5 strength LUE, LLE  Psychiatric:        Thought Content: Thought content normal.        Judgment: Judgment normal.      ED Treatments / Results  Labs (all labs ordered are listed, but only abnormal results are displayed) Labs Reviewed  COMPREHENSIVE METABOLIC PANEL - Abnormal; Notable for the following components:      Result Value   Chloride 112 (*)    CO2 21 (*)    Glucose, Bld 105 (*)    Creatinine, Ser 1.84 (*)    GFR calc non Af Amer 40 (*)    GFR calc Af Amer 46 (*)    All other components within normal limits  I-STAT CHEM 8, ED - Abnormal; Notable for the following components:   Chloride 113 (*)    Creatinine, Ser 1.90 (*)    Glucose, Bld 102 (*)    TCO2 21 (*)    All other components within normal limits  SARS CORONAVIRUS 2 (HOSPITAL ORDER, PERFORMED IN [place]  [facility])  PROTIME-INR  APTT  CBC  DIFFERENTIAL  URINALYSIS, ROUTINE W REFLEX MICROSCOPIC  ETHANOL  RAPID URINE DRUG SCREEN, HOSP PERFORMED  CBG MONITORING, ED    EKG EKG Interpretation  Date/Time:  Saturday December 19 2018 07:51:42 EDT Ventricular Rate:  82 PR Interval:    QRS Duration: 137 QT Interval:  429 QTC Calculation: 502 R Axis:   -89 Text Interpretation:  Sinus rhythm LAE, consider biatrial enlargement RBBB and LAFB ST elevation suggests acute pericarditis similar to previous Confirmed by Frederick PeersLittle, [place] [phone]([id number]) on 12/19/2018 8:45:59 AM   Radiology Ct Head Wo Contrast  Result Date: 12/19/2018 CLINICAL DATA:  [age] year old male with right-sided weakness. Found down on the floor by family member. EXAM: CT HEAD WITHOUT CONTRAST TECHNIQUE: Contiguous axial images were obtained from the base of the skull through the vertex without intravenous contrast. COMPARISON:  None. FINDINGS: Brain: Patchy and confluent areas of decreased attenuation are noted throughout the deep and periventricular white matter of the cerebral hemispheres bilaterally, compatible with chronic microvascular ischemic disease. No evidence of acute infarction, hemorrhage, hydrocephalus, extra-axial collection or mass lesion/mass effect. Vascular: No hyperdense vessel or unexpected calcification. Skull: Normal. Negative for fracture or focal lesion. Sinuses/Orbits: No acute finding. Small mucosal retention cyst or polyp in the posterior aspect of the left maxillary sinus. Other: None. IMPRESSION: 1. No acute intracranial abnormalities. 2. Chronic microvascular ischemic changes in the cerebral white matter, as above. Electronically Signed   By: Trudie Reedaniel  Entrikin M.D.   On: 12/19/2018 09:41    Procedures Procedures (including critical care time)  Medications Ordered in ED Medications - No data to display   Initial Impression / Assessment and Plan / ED Course  I have reviewed the triage vital signs and the  nursing notes.  Pertinent labs & imaging results that were available during my care of the patient were reviewed by me and considered in my medical decision making (see chart for details).       Pt comfortable on exam, hypertensive.  I appreciated right-sided weakness, not left despite EMSs report of left weakness.  Last seen normal was midnight therefore outside window for TPA.  His lab work shows creatinine of 1.9, unclear whether this is acute or chronic especially in the setting of elevated blood pressure here.  Although he denied substance use, UDS is positive for cocaine.  Head CT negative acute but does have chronic microvascular ischemic changes.  Discussed with neurologist Dr. Malen Gauze who will see the patient in consultation and agreed with plan to admit for stroke work-up.  Discussed with internal medicine teaching service and patient admitted for further care.  Final Clinical Impressions(s) / ED Diagnoses   Final diagnoses:  Right sided weakness  Hypertension, unspecified type    ED Discharge Orders    None       Amberrose Friebel, Wenda Overland, MD 12/19/18 1126

## 2018-12-19 NOTE — ED Notes (Signed)
Pt returns from ct scan, remains on monitor. No needs from pt at this time.

## 2018-12-19 NOTE — H&P (Addendum)
Date: 12/19/2018               Patient Name:  Philip Vasquez MRN: [id number]  DOB: 04/29/1960 Age / Sex: 58 y.o., male   PCP: Patient, No Pcp Per         Medical Service: Internal Medicine Teaching Service         Attending Physician: Dr. Inez CatalinaMullen, Emily B, MD    First Contact: Dr. Lyn HollingsheadAlexander Pager: [phone]  Second Contact: Dr. Crista ElliotHarbrecht Pager: [phone]       After Hours (After 5p/  First Contact Pager: [phone]  weekends / holidays): Second Contact Pager: [phone]   Chief Complaint: AMS   History of Present Illness: [name] is a 58 year old gentleman with no past medical history who presented with AMS and R sided weakness. The patient says he woke up at 3 AM today and tried to get up from his bed to use the bathroom and noticed he could not stand. He fell on the floor and his mother called [phone] when he could not stand up. His weakness has not improved or worsened since that time. Patient also notes that he is slurring his speech. Nothing like this is ever happened before. He denies hitting his head or losing consciousness when he fell this morning. Patient did admit that he used cocaine last night and uses it on a daily basis. Denies having fevers, visual changes, headache, numbness or tingling, chest pain, shortness of breath, changes in his bowel or bladder habits.  In the ED, the patient underwent a head CT which did not show any acute intracranial abnormalities but was notable for chronic microvascular ischemic changes.  CBC, BMP, urinalysis, and UDS were obtained. All of are unremarkable for the exception of an elevated creatinine to 1.84 (BL unknown) and UDS which was positive for cocaine.  Meds: No outpatient medications have been marked as taking for the 12/19/18 encounter [facility](Hospital Encounter).   Allergies: Allergies as of 12/19/2018  . (No Known Allergies)   Surgical History: - L patellar fracture s/p fixation 6 years ago  Family History:  Denies family history of stroke  or heart attacks  Social History:  - Patient currently lives with his mother at home. - Does not currently work. - Smokes 3 crack rocks a day. - Drinks 2-3 beers a day.  Denies tobacco use  Review of Systems: A complete ROS was negative except as per HPI.   Physical Exam: Blood pressure (!) 147/115, pulse 73, temperature 99 F (37.2 C), temperature source Oral, resp. rate (!) 23, height 5\' 9"  (1.753 m), weight 102.1 kg, SpO2 96 %.  Physical Exam Vitals signs reviewed.  Constitutional:      General: He is not in acute distress.    Appearance: He is diaphoretic.  HENT:     Head: Normocephalic and atraumatic.     Nose: No rhinorrhea.     Mouth/Throat:     Mouth: Mucous membranes are moist.     Pharynx: No oropharyngeal exudate or posterior oropharyngeal erythema.  Eyes:     General:        Right eye: No discharge.        Left eye: No discharge.     Extraocular Movements: Extraocular movements intact.     Pupils: Pupils are equal, round, and reactive to light.     Comments: Bilateral scleral injection  Cardiovascular:     Rate and Rhythm: Normal rate and regular rhythm.     Pulses: Normal  pulses.     Heart sounds: Normal heart sounds. No murmur. No friction rub. No gallop.   Pulmonary:     Effort: Pulmonary effort is normal. No respiratory distress.     Breath sounds: Normal breath sounds. No wheezing or rales.  Abdominal:     General: Bowel sounds are normal. There is distension.     Tenderness: There is no abdominal tenderness. There is no guarding or rebound.     Hernia: A hernia (small reducible periumbilical hernia) is present.  Musculoskeletal:     Right lower leg: No edema.     Left lower leg: No edema.     Comments: RUE: 3/5 strength RLE: 2/5 strength LUE: 5/5 strength LLE: 5/5 strength   Skin:    General: Skin is warm.  Neurological:     Mental Status: He is alert. He is disoriented.     Cranial Nerves: Cranial nerve deficit (R sided cranial nerve XI.  All  other cranial nerves intact) present.     Sensory: No sensory deficit.     Comments: Somnolent, and intermittently responds to questions with one word answers.  Follows commands when awake    CT Head: IMPRESSION: 1. No acute intracranial abnormalities. 2. Chronic microvascular ischemic changes in the cerebral white matter, as above.  EKG:  Sinus rhythm LAE, consider biatrial enlargement RBBB and LAFB Probable anterolateral infarct, acute ST elevation, consider inferior injury >>> Acute MI <<<  Assessment & Plan by Problem: Active Problems:   Acute right-sided weakness  In summary [name] is a 58 year old gentleman who uses cocaine daily with no recorded past medical history who presents today after experiencing right sided weakness, speech abnormalities and somnolence, concerning for acute stroke.  Additionally, the patient's work-up was notable for abnormal EKG changes concerning for MI. This is all in the context of consistent and heavy cocaine use.  #R sided weakness #Stroke?:  Patient's presentation of R sided weakness, speech abnormalities and somnolence is concerning for an acute stroke. Neurology is on board and recommend secondary stroke work-up. - Permissive hypertension upto 220/110 mmHg up to 48hrs post initial admission (9/14) - Start rosuvastatin 20 mg daily and aspirin 81 mg daily - Order Lipid profile, hemoglobin A1c - MRI brain - CT angiogram brain - Echocardiogram - Telemetry - PT/OT/SLP consult  #ST elevations: Patient has ST elevations in multiple leads on EKG.  No prior EKGs to compare.  Concerning for MI but may be due to cocaine use. -  Stat troponins ordered, will trend   #AKI #CKD: Cr on admission 1.84. No baseline available. Received 1L LR bolus initially. GFR is diminished at 40, so this may represent CKD as opposed to an AKI. - LR 100 cc/hr infusion - Daily BMP  #DVT prophylaxis - Lovenox 40 mg subq injections  #Dispo: Admit patient to  Inpatient with expected length of stay greater than 2 midnights.  Signed: [name], MD Internal Medicine, PGY1 Pager: [phone]  12/19/2018,2:10 PM

## 2018-12-20 ENCOUNTER — Other Ambulatory Visit: Payer: Self-pay

## 2018-12-20 ENCOUNTER — Inpatient Hospital Stay (HOSPITAL_COMMUNITY): Payer: Medicaid Other

## 2018-12-20 DIAGNOSIS — I340 Nonrheumatic mitral (valve) insufficiency: Secondary | ICD-10-CM

## 2018-12-20 DIAGNOSIS — I6330 Cerebral infarction due to thrombosis of unspecified cerebral artery: Secondary | ICD-10-CM | POA: Diagnosis present

## 2018-12-20 LAB — BASIC METABOLIC PANEL
Anion gap: 9 (ref 5–15)
BUN: 15 mg/dL (ref 6–20)
CO2: 22 mmol/L (ref 22–32)
Calcium: 8.8 mg/dL — ABNORMAL LOW (ref 8.9–10.3)
Chloride: 109 mmol/L (ref 98–111)
Creatinine, Ser: 1.6 mg/dL — ABNORMAL HIGH (ref 0.61–1.24)
GFR calc Af Amer: 54 mL/min — ABNORMAL LOW (ref >60)
GFR calc non Af Amer: 47 mL/min — ABNORMAL LOW (ref >60)
Glucose, Bld: 109 mg/dL — ABNORMAL HIGH (ref 70–99)
Potassium: 3.6 mmol/L (ref 3.5–5.1)
Sodium: 140 mmol/L (ref 135–145)

## 2018-12-20 LAB — ECHOCARDIOGRAM COMPLETE
Height: 69 in
Weight: 3600 oz

## 2018-12-20 LAB — PHOSPHORUS: Phosphorus: 3.3 mg/dL (ref 2.5–4.6)

## 2018-12-20 LAB — CBC
HCT: 42.2 % (ref 39.0–52.0)
Hemoglobin: 14 g/dL (ref 13.0–17.0)
MCH: 29.6 pg (ref 26.0–34.0)
MCHC: 33.2 g/dL (ref 30.0–36.0)
MCV: 89.2 fL (ref 80.0–100.0)
Platelets: 222 10*3/uL (ref 150–400)
RBC: 4.73 MIL/uL (ref 4.22–5.81)
RDW: 13 % (ref 11.5–15.5)
WBC: 7.2 10*3/uL (ref 4.0–10.5)
nRBC: 0 % (ref 0.0–0.2)

## 2018-12-20 LAB — MAGNESIUM: Magnesium: 2.1 mg/dL (ref 1.7–2.4)

## 2018-12-20 LAB — HIV ANTIBODY (ROUTINE TESTING W REFLEX): HIV Screen 4th Generation wRfx: NONREACTIVE

## 2018-12-20 LAB — TROPONIN I (HIGH SENSITIVITY): Troponin I (High Sensitivity): 39 ng/L — ABNORMAL HIGH (ref <18)

## 2018-12-20 MED ORDER — IOHEXOL 350 MG/ML SOLN
75.0000 mL | Freq: Once | INTRAVENOUS | Status: AC | PRN
  Administered 2018-12-20: 100 mL via INTRAVENOUS

## 2018-12-20 MED ORDER — THIAMINE HCL 100 MG/ML IJ SOLN
100.0000 mg | Freq: Every day | INTRAMUSCULAR | Status: DC
  Administered 2018-12-21 – 2019-01-11 (×4): 100 mg via INTRAVENOUS
  Filled 2018-12-20 (×10): qty 2

## 2018-12-20 MED ORDER — VITAMIN B-1 100 MG PO TABS
100.0000 mg | ORAL_TABLET | Freq: Every day | ORAL | Status: DC
  Administered 2018-12-20 – 2019-06-14 (×173): 100 mg via ORAL
  Filled 2018-12-20 (×172): qty 1

## 2018-12-20 MED ORDER — ADULT MULTIVITAMIN W/MINERALS CH
1.0000 | ORAL_TABLET | Freq: Every day | ORAL | Status: DC
  Administered 2018-12-20 – 2019-06-14 (×177): 1 via ORAL
  Filled 2018-12-20 (×178): qty 1

## 2018-12-20 MED ORDER — LACTATED RINGERS IV BOLUS
1000.0000 mL | Freq: Once | INTRAVENOUS | Status: AC
  Administered 2018-12-20: 1000 mL via INTRAVENOUS

## 2018-12-20 MED ORDER — FOLIC ACID 1 MG PO TABS
1.0000 mg | ORAL_TABLET | Freq: Every day | ORAL | Status: DC
  Administered 2018-12-20 – 2019-06-14 (×177): 1 mg via ORAL
  Filled 2018-12-20 (×177): qty 1

## 2018-12-20 MED ORDER — ROSUVASTATIN CALCIUM 20 MG PO TABS
40.0000 mg | ORAL_TABLET | Freq: Every day | ORAL | Status: DC
  Administered 2018-12-20 – 2019-06-13 (×172): 40 mg via ORAL
  Filled 2018-12-20 (×181): qty 2

## 2018-12-20 NOTE — Plan of Care (Signed)
Patient stable, brother notified via telephone, agreeable with plan, denies question/concerns at this time.

## 2018-12-20 NOTE — Progress Notes (Signed)
Subjective: Pt seen at the bedside on rounds this AM.  Patient has some difficulty speaking today.  Says he has chest pain today.  Weakness on his right side is still present.  The team explained the MRI findings are consistent with with a stroke and is what is causing his speech abnormalities and weakness on the right side of his body. patient voiced understanding.  Objective:  MRI head: IMPRESSION: 1. Widely scattered small acute infarcts in the medial left hemisphere involving left ACA and left ACA/MCA watershed area. No associated hemorrhage or mass effect. 2. 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  Vital signs in last 24 hours: Vitals:   12/20/18 0250 12/20/18 0418 12/20/18 0450 12/20/18 0755  BP: (!) 197/107 (!) 190/107 (!) 183/115 (!) 160/128  Pulse: 82 79 79   Resp: 16 20 11    Temp: 98.2 F (36.8 C) 98.2 F (36.8 C) 98.2 F (36.8 C) 98 F (36.7 C)  TempSrc: Oral Oral Oral Oral  SpO2:  92%  99%  Weight:      Height:       Physical Exam: General: Lying in bed, NAD HEENT: NCAT, poor dentition CV: RRR, normal S1-S2, no murmurs rubs or gallops appreciated PULM: Clear to auscultation bilaterally. Transmitted upper airway sounds present ABD: Distended, small periumbilical hernia present.  NEURO:  Mental Status: Patient is awake, alert. Aphagia present Cranial Nerves: II: Pupils equal, round, and reactive to light.   III,IV, VI: EOMI without ptosis or diploplia.  V: Facial sensation is symmetric to gross touch VII: Facial movement is symmetric.  VIII: hearing is intact to voice X: Uvula elevates symmetrically XI: Shoulder shrug is asymmetric, L>>R XII: tongue is midline without atrophy or fasciculations.  Motor:  3/5 RUE, 5/5 LUE, 3/5 RLE, 5/5 LLE Sensory: Sensation is grossly intact in bilateral UEs & LEs Deep Tendon Reflexes: intact Plantars: Toes are  downgoing bilaterally. No clonus Cerebellar: Finger-Nose intact bilaterally, R is worse than L likely secondary to weakness.  Assessment/Plan:  Active Problems:   Acute right-sided weakness  In summary Philip Vasquez is a 58 year old gentleman who uses cocaine daily with no recorded past medical history who presents today after experiencing right sided weakness, speech abnormalities and somnolence, and MRI head findings that showed widely scattered small acute infarcts in the medial left hemisphere involving left ACA and left ACA/MCA watershed area and chronic small vessel disease. This is all in the context of daily heavy cocaine use.  #R sided weakness #L ACA/MCA  Infarcts:  MRI of the head showed widely scattered small acute infarcts in the medial left hemisphere involving left ACA and left ACA/MCA watershed area in addition to chronic small vessel disease. Neurology is on board and recommend secondary stroke work-up.  - Permissive hypertension upto 220/110 mmHg up to 48hrs post initial admission (9/14) - Rosuvastatin 40 mg & aspirin 81 mg daily - Hemoglobin A1c currently pending - CT angiogram brain pending - Echocardiogram pending - Carotid Dopplers pending - Continue telemetry - PT/OT/SLP consult, appreciate recommendations  #ST elevations: Patient has ST elevations in multiple leads on EKG during admission.  No prior EKGs to compare. Troponins were 45, 35, 39.  The patient is complaining of chest pain today. -  Reorder EKG today   #HLD: Lipid panel significant for elevated cholesterol up to 273 and LDL cholesterol to 199.  Needs high intensity statin therapy. - Rosuvastatin 40 mg daily  #  AKI #CKD?: Cr on admission 1.90, GFR 40. No baseline available. Received 1L LR bolus in the ED.  CR has down trended to 1.60 so far.  The elevated creatinine could suggest presence of CKD as opposed to AKI. - Continue LR 100 cc/hr infusion - Daily BMP  #DVT prophylaxis - Lovenox 40 mg subq  injections  #Dispo: Pending medical course.  [name], MD Internal Medicine, PGY1 Pager: [phone]  12/20/2018,11:12 AM

## 2018-12-20 NOTE — Evaluation (Signed)
Occupational Therapy Evaluation Patient Details Name: Philip Vasquez MRN: [id number] DOB: Nov 27, 1960 Today's Date: 12/20/2018    History of Present Illness Philip Vasquez is a 58 year old gentleman with no past medical history who presented with AMS and right-sided weakness. MRI: Widely scattered small acute infarcts in the medial lefthemisphere involving left ACA and left ACA/MCA watershed area.   Clinical Impression   This 58 yo male admitted with above presents to acute OT with expressive and receptive difficulties, decreased AROM of right side, decreased mobility, and decreased balance, as well as right inattention all affecting his safety and independence with basic ADLs. He will benefit from acute OT with follow up on CIR as long as he has A post D/C.     Follow Up Recommendations  CIR;Supervision/Assistance - 24 hour    Equipment Recommendations  Other (comment)(TBD at next venue)    Recommendations for Other Services Rehab consult     Precautions / Restrictions Precautions Precautions: Fall Precaution Comments: Noted 15 sec periods of apnea during eval with HOB ~35 degrees as well as more labored breathing when he was flat in bed to roll. Montior BP--as high as 190/118 prior to rolling in bed to get cleaned up. In chart-- (Permissive hypertension upto 220/110 mmHg up to 48hrs post initial admission (9/14) Restrictions Weight Bearing Restrictions: No      Mobility Bed Mobility Overal bed mobility: Needs Assistance Bed Mobility: Rolling Rolling: Max assist         General bed mobility comments: Step by step cues and A both directions     Balance                                           ADL either performed or assessed with clinical judgement   ADL                                         General ADL Comments: total A     Vision   Vision Assessment?: Vision impaired- to be further tested in functional context Additional  Comments: Pt would have moments he would "zone out" and not follow my commands with attempts to check vision. He was able to reach with LUE in all directions while supine in bed to touch my pen.     Perception     Praxis      Pertinent Vitals/Pain Pain Assessment: Faces Faces Pain Scale: No hurt     Hand Dominance Right   Extremity/Trunk Assessment Upper Extremity Assessment Upper Extremity Assessment: RUE deficits/detail RUE Deficits / Details: Showed some movement on command elbow and distally but not at shoulder. While supine with HOB up he was able to touch his nose if I supported weight of arm and he was able to grip and release my hand as well as give me a thumbs up RUE Coordination: decreased gross motor;decreased fine motor   Lower Extremity Assessment Lower Extremity Assessment: (increased extensor tone noted in RLE when he was asked to try and move it)       Communication Communication Communication: No difficulties(now with expressive and receptive difficulties)   Cognition Arousal/Alertness: Awake/alert Behavior During Therapy: Flat affect Overall Cognitive Status: Difficult to assess  Home Living Family/patient expects to be discharged to:: Inpatient rehab                                        Prior Functioning/Environment          Comments: Unable to get information from pt due to expressive difficulties        OT Problem List: Decreased strength;Decreased range of motion;Decreased activity tolerance;Impaired balance (sitting and/or standing);Impaired vision/perception;Decreased coordination;Decreased cognition;Decreased safety awareness;Decreased knowledge of use of DME or AE;Cardiopulmonary status limiting activity;Impaired tone;Obesity;Impaired UE functional use      OT Treatment/Interventions: Self-care/ADL training;Neuromuscular education;Therapeutic  activities;Therapeutic exercise;DME and/or AE instruction;Patient/family education;Balance training;Visual/perceptual remediation/compensation    OT Goals(Current goals can be found in the care plan section) Acute Rehab OT Goals Patient Stated Goal: pt unable to state due to expressive difficulties OT Goal Formulation: Patient unable to participate in goal setting Time For Goal Achievement: 01/03/19 Potential to Achieve Goals: Fair  OT Frequency: Min 2X/week              AM-PAC OT "6 Clicks" Daily Activity     Outcome Measure Help from another person eating meals?: Total Help from another person taking care of personal grooming?: Total Help from another person toileting, which includes using toliet, bedpan, or urinal?: Total Help from another person bathing (including washing, rinsing, drying)?: Total Help from another person to put on and taking off regular upper body clothing?: Total Help from another person to put on and taking off regular lower body clothing?: Total 6 Click Score: 6   End of Session Nurse Communication: (pt with 2 episodes of 15 sec apnea (each followed by "catch up breathing") right before I left the room)  Activity Tolerance: Other (comment)(limited by increase in BP and needing +2 to mobilize pt safely up to EOB) Patient left:    OT Visit Diagnosis: Other abnormalities of gait and mobility (R26.89);Muscle weakness (generalized) (M62.81);Low vision, both eyes (H54.2);Other symptoms and signs involving cognitive function;Cognitive communication deficit (R41.841);Hemiplegia and hemiparesis Symptoms and signs involving cognitive functions: Cerebral infarction Hemiplegia - Right/Left: Right Hemiplegia - dominant/non-dominant: Dominant Hemiplegia - caused by: Cerebral infarction                Time: [id number] OT Time Calculation (min): 36 min Charges:  OT General Charges $OT Visit: 1 Visit OT Evaluation $OT Eval Moderate Complexity: 1 Mod OT Treatments $Self  Care/Home Management : 8-22 mins  Ignacia Palmaathy Tiger Spieker, OTR/L Acute Rehab Services Pager [phone] Office [phone]     Evette GeorgesLeonard, Liora Myles Eva 12/20/2018, 4:46 PM

## 2018-12-20 NOTE — Progress Notes (Signed)
Rehab Admissions Coordinator Note:  Per OT recommendation, this patient was screened by Jhonnie Garner for appropriateness for an Inpatient Acute Rehab Consult.  At this time, we will follow for progress with therapy and tolerance prior to possibly requesting consult order. Will need to see evidence of greater activity tolerance and attempts at OOB activity to see if pt able to tolerate the intensity of CIR. Will follow.   Jhonnie Garner 12/20/2018, 4:51 PM  I can be reached at [phone].

## 2018-12-20 NOTE — Progress Notes (Signed)
STROKE TEAM PROGRESS NOTE   HISTORY OF PRESENT ILLNESS (per record) Philip Vasquez is an 58 y.o. male with no PMH who [facility] for  left side weakness and numbness. Patient was in his usual state of health at midnight when he went to bed.  Admits to doing cocaine last night.  About 0300 he woke up and got out of bed, at this time he noted some weakness and fell to the floor. He stayed on the floor for about 2 hours before he could get assistance to get up. EMS helped him up. Per patient he could not get off the floor, but somehow when his mom called ( he lives with his mother), he was able to get to the phone. He told her that he was on the floor and she called EMS.  Per EMS patient had difficulty standing and some speech problems with left sided weakness. Patient denies hitting head, loss of consciousness anticoagulation use, dizziness, room spinning, tingling, vision changes. Patient endorses smoking 3 crack rocks per day, and drinking 3 beers per day. Denies tobacco abuse and no other drug use. No prior stroke history. Denies family history of stroke. Denies DM, HTN, cardiac problems, CP, SOB. ED course:  CTH: no hemorrhage UDS; + cocaine BG: 102 BP: 147/115 UA: neagative Creatinine: 1.9  Date last known well: 12/19/2018 Time last known well: 0000 tPA Given: {no; outside of window Modified Rankin: Rankin Score=0 NIHSS:3, right leg   INTERVAL HISTORY Patient is lying comfortably in bed.  I personally reviewed history of presenting illness as well as electronic medical records and imaging films in PACS.  He continues to have significant right leg weakness.  No other changes.    OBJECTIVE Vitals:   12/20/18 0250 12/20/18 0418 12/20/18 0450 12/20/18 0755  BP: (!) 197/107 (!) 190/107 (!) 183/115 (!) 160/128  Pulse: 82 79 79   Resp: 16 20 11    Temp: 98.2 F (36.8 C) 98.2 F (36.8 C) 98.2 F (36.8 C) 98 F (36.7 C)  TempSrc: Oral Oral Oral Oral  SpO2:  92%  99%   Weight:      Height:        CBC:  Recent Labs  Lab 12/19/18 0807 12/19/18 0815 12/20/18 0112  WBC 8.7  --  7.2  NEUTROABS 6.3  --   --   HGB 14.7 14.3 14.0  HCT 43.3 42.0 42.2  MCV 88.5  --  89.2  PLT 228  --  222    Basic Metabolic Panel:  Recent Labs  Lab 12/19/18 0807 12/19/18 0815 12/20/18 0112  NA 142 143 140  K 4.0 4.0 3.6  CL 112* 113* 109  CO2 21*  --  22  GLUCOSE 105* 102* 109*  BUN 17 20 15   CREATININE 1.84* 1.90* 1.60*  CALCIUM 9.3  --  8.8*    Lipid Panel:     Component Value Date/Time   CHOL 273 (H) 12/19/2018 1918   TRIG 84 12/19/2018 1918   HDL 57 12/19/2018 1918   CHOLHDL 4.8 12/19/2018 1918   VLDL 17 12/19/2018 1918   LDLCALC 199 (H) 12/19/2018 1918   HgbA1c: No results found for: HGBA1C Urine Drug Screen:     Component Value Date/Time   LABOPIA NONE DETECTED 12/19/2018 1042   COCAINSCRNUR POSITIVE (A) 12/19/2018 1042   LABBENZ NONE DETECTED 12/19/2018 1042   AMPHETMU NONE DETECTED 12/19/2018 1042   THCU NONE DETECTED 12/19/2018 1042   LABBARB NONE DETECTED 12/19/2018  1042    Alcohol Level     Component Value Date/Time   ETH <10 12/19/2018 1918    IMAGING Dg Abd 1 View 12/19/2018 IMPRESSION:  Unremarkable bowel gas pattern.  No acute abnormality.   Ct Head Wo Contrast 12/19/2018 IMPRESSION:  1. No acute intracranial abnormalities.  2. Chronic microvascular ischemic changes in the cerebral white matter, as above.   Mr Brain Wo Contrast 12/19/2018 IMPRESSION:  1. Widely scattered small acute infarcts in the medial left hemisphere involving left ACA and left ACA/MCA watershed area. No associated hemorrhage or mass effect.  2. 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    CTA Head and Neck - pending    Transthoracic Echocardiogram  Reduced systolic function with ejection fraction 45 to 50%.  No wall motion  abnormalities.  Moderate thickening of the mitral valve leaflet.    ECG - SR rate 83 BPM. Bifascicular block (See cardiology reading for complete details)     PHYSICAL EXAM Blood pressure (!) 160/128, pulse 79, temperature 98 F (36.7 C), temperature source Oral, resp. rate 11, height 5\' 9"  (1.753 m), weight 102.1 kg, SpO2 99 %. Obese middle-aged African-American male not in distress. . Afebrile. Head is nontraumatic. Neck is supple without bruit.    Cardiac exam no murmur or gallop. Lungs are clear to auscultation. Distal pulses are well felt. Neurological Exam :  Drowsy but awakens easily t oriented to time place and person.  Moderate dysarthria can be understood with some difficulty.  Follows commands well.  Extraocular movements are full range without nystagmus.  Face is symmetric without weakness.  Tongue is midline.  Motor system exam shows mild right upper extremity drift with weakness of right grip and intrinsic hand muscles and orbits left over right upper extremity.  Significant right lower extremity weakness with 1/5 strength with diminished tone.  Reflexes are depressed on the right compared to the left.  Right plantar upgoing left downgoing.  Gait not tested.  Sensation is intact bilaterally.       ASSESSMENT/PLAN [name] is a 58 y.o. male with history of substance abuse presenting with left sided weakness / numbness. He did not receive IV t-PA due to late presentation (>4.5 hours from time of onset).   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    Resultant right leg greater than hand weakness  Code Stroke CT Head - not ordered  CT head - No acute intracranial abnormalities. Chronic microvascular ischemic changes in the cerebral white matter,   MRI head - Widely scattered  small acute infarcts in the medial left hemisphere involving left ACA and left ACA/MCA watershed area.  MRA head - not ordered  CTA H&N -left anterior cerebral artery occlusion.  Mild atherosclerotic changes at carotid bifurcation bilaterally.  CT Perfusion - not ordered  Carotid Doppler - pending - CTA neck pending - carotid dopplers not indicated.  2D Echo -diminished ejection fraction 45 to 50%.  No clot [employer] Virus 2  - negative  Transcranial dopplers -  See CTA  LDL - 199  HgbA1c - pending  UDS - positive for cocaine  VTE prophylaxis - Lovenox Diet  Diet Order            Diet regular Room service appropriate? Yes;  Fluid consistency: Thin  Diet effective now              No antithrombotic prior to admission, now on aspirin 81 mg daily  Patient counseled to be compliant with his antithrombotic medications  Ongoing aggressive stroke risk factor management  Therapy recommendations:  pending  Disposition:  Pending  Hypertension  Home BP meds: none  Current BP meds: none  Blood pressure somewhat high at times but within post stroke/TIA parameters . Permissive hypertension (OK if < 220/120) but gradually normalize in 5-7 days  . Long-term BP goal normotensive  Hyperlipidemia  Home Lipid lowering medication:  none  LDL 199, goal < 70  Current lipid lowering medication: Crestor 40 mg daily  Continue statin at discharge  Diabetes  Home diabetic meds:  Current diabetic meds:  HgbA1c - pending, goal < 7.0 Recent Labs    12/19/18 0808  GLUCAP 92    Other Stroke Risk Factors  ETOH use, advised to drink no more than 1 alcoholic beverage per day.  Obesity, Body mass index is 33.23 kg/m., recommend weight loss, diet and exercise as appropriate   Family hx stroke - not on file  Substance Abuse  Other Active Problems  Elevated creatinine - 1.84->1.90->1.60   Hospital day # 1  I have personally obtained history,examined this patient,  reviewed notes, independently viewed imaging studies, participated in medical decision making and plan of care.ROS completed by me personally and pertinent positives fully documented  I have made any additions or clarifications directly to the above note.  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  Patient is not a good long-term candidate for anticoagulation given his drug abuse and noncompliance with medical treatment hence we will not do TEE or prolonged cardiac monitoring.  Recommend aspirin and Plavix for 3 weeks followed by aspirin.  Patient counseled to quit cocaine.  Aggressive risk factor modification.  Greater than 50% time during this 25-minute visit was spent on counseling and coordination of care about his stroke and answering questions.  Follow-up as an outpatient stroke clinic in 6 weeks.  Stroke team will sign off.  Kindly call for questions.  Discussed with Dr. Kirt Boys. Delia Heady, MD Medical Director [facility] Pager: [phone] 12/20/2018 2:57 PM   To contact Stroke Continuity provider, please refer to [email or web address]. After hours, contact General Neurology

## 2018-12-20 NOTE — Progress Notes (Signed)
  Echocardiogram 2D Echocardiogram has been performed.  Johny Chess 12/20/2018, 2:00 PM

## 2018-12-20 NOTE — Evaluation (Signed)
Physical Therapy Evaluation Patient Details Name: Philip Vasquez MRN: [id number] DOB: 07-05-1960 Today's Date: 12/20/2018   History of Present Illness  Philip Vasquez is a 58 year old gentleman with no past medical history who presented with AMS and right-sided weakness. MRI: Widely scattered small acute infarcts in the medial lefthemisphere involving left ACA and left ACA/MCA watershed area.    Clinical Impression  Pt difficult to assess today. He only spoke one time - it was clear - 'it feels good to sit up'.  Pt had drool in his mouth - I cleaned his mouth out to see if he would talk more but he didn't.  The rest of the session he grunted to my questions - inconsistently - and I couldn't tell if it was yes or no grunt.  Pt with apnea while awake - irregular breathing pattern - seemed labored - but his O2 sats remained >96%.  I sat pt up on side of the bed. He didn't participate much at all to get up or position but then sat 10 minutes with min assist.  I then needed +2 .help to lower him down and reposition him.  I dont know his previous mobilty status or his DC possibilities.  He will need therapy to help get his mobilty back - unsure if he can tolerate CIR level care.  Will see as he progresses.    Follow Up Recommendations SNF;CIR;Supervision/Assistance - 24 hour    Equipment Recommendations  Other (comment)(TBD)    Recommendations for Other Services Rehab consult     Precautions / Restrictions Precautions Precautions: Fall Precaution Comments: Pt still with apnea(supine and sitting - 5-10 second periods - when awake.  pts breathing was labored - he grunted yes that this is normal.  O2 sats stayed 96% and higher Restrictions Weight Bearing Restrictions: No      Mobility  Bed Mobility Overal bed mobility: Needs Assistance Bed Mobility: Supine to Sit;Rolling Rolling: Max assist   Supine to sit: Max assist;+2 for physical assistance     General bed mobility comments: Pt assisted into  sitting with max assist.  I had to give verbal cues and use chuck to help pt get into sitting.  I had pt square pt up and get both feet on the floor - pt didnt assist in positioning.  pt sat EOB for 14 minutes.  First 10 minutes pt sat and held onto the end of bed with left hand and CGA.  He verbalized that it felt good to sit.  Then pt tucked the left foot under him and leaned anterior - needing mod assist to remain sitting - not sure why - he wouldnt say.  no change in vitals.  I then had +2 total assist to help pt lay down and scoot up in bed and get positioned.  Transfers                 General transfer comment: Will need +2 assist -he will be a difficult transfer  Ambulation/Gait                Stairs            Wheelchair Mobility    Modified Rankin (Stroke Patients Only)       Balance Overall balance assessment: Needs assistance Sitting-balance support: Single extremity supported Sitting balance-Leahy Scale: Fair Sitting balance - Comments: see under transfers - pt sat up 14 minutes - assist needed  Pertinent Vitals/Pain Pain Assessment: Faces Faces Pain Scale: Hurts little more Pain Location: pointed to left hip/back in sitting - when i asked if he hurt.  it could have been catheter or towel betwwn his legs wasnt comfortable too Pain Intervention(s): Monitored during session;Repositioned    Home Living Family/patient expects to be discharged to:: Inpatient rehab                      Prior Function           Comments: Unable to get information from pt due to expressive difficulties     Hand Dominance   Dominant Hand: Right    Extremity/Trunk Assessment   Upper Extremity Assessment Upper Extremity Assessment: Defer to OT evaluation RUE Deficits / Details: Showed some movement on command elbow and distally but not at shoulder. While supine with HOB up he was able to touch his nose if  I supported weight of arm and he was able to grip and release my hand as well as give me a thumbs up RUE Coordination: decreased gross motor;decreased fine motor    Lower Extremity Assessment Lower Extremity Assessment: RLE deficits/detail;LLE deficits/detail RLE Deficits / Details: I did PROM - pt did not help move his leg.  no movements seen throughout PT eval of right leg.  pt with no increased tone LLE Deficits / Details: pt did AROM heel slides in bed on my command.  Pt h as a clonic jerking movement when laying in bed - unsure if this is intential or premorbid    Cervical / Trunk Assessment Cervical / Trunk Assessment: Normal  Communication   Communication: (pt grunted answers to me - I couldnt tell if yes or no.  Pt did talk one time - said it felt good to sit up.  when he did - he drooled.  i cleaned out mouth - hoping he would talk more but he didnt)  Cognition Arousal/Alertness: Lethargic Behavior During Therapy: Flat affect Overall Cognitive Status: Difficult to assess                                 General Comments: pt followed half of my commands.  unable to tell if he was answering me yes or no      General Comments      Exercises     Assessment/Plan    PT Assessment Patient needs continued PT services  PT Problem List Decreased strength;Decreased mobility;Decreased safety awareness;Impaired tone;Decreased range of motion;Decreased knowledge of precautions;Obesity;Decreased activity tolerance;Decreased cognition;Cardiopulmonary status limiting activity;Decreased balance;Decreased knowledge of use of DME;Impaired sensation       PT Treatment Interventions DME instruction;Therapeutic activities;Cognitive remediation;Gait training;Therapeutic exercise;Patient/family education;Balance training;Functional mobility training;Neuromuscular re-education    PT Goals (Current goals can be found in the Care Plan section)  Acute Rehab PT Goals Patient Stated  Goal: pt unable to state due to expressive difficulties PT Goal Formulation: Patient unable to participate in goal setting Time For Goal Achievement: 01/03/19 Potential to Achieve Goals: Fair    Frequency Min 3X/week   Barriers to discharge   DC plans unknown    Co-evaluation               AM-PAC PT "6 Clicks" Mobility  Outcome Measure Help needed turning from your back to your side while in a flat bed without using bedrails?: Total Help needed moving from lying on your back to sitting on the  side of a flat bed without using bedrails?: Total Help needed moving to and from a bed to a chair (including a wheelchair)?: Total Help needed standing up from a chair using your arms (e.g., wheelchair or bedside chair)?: Total Help needed to walk in hospital room?: Total Help needed climbing 3-5 steps with a railing? : Total 6 Click Score: 6    End of Session   Activity Tolerance: Patient tolerated treatment well;Patient limited by lethargy Patient left: in bed;with bed alarm set;with call bell/phone within reach Nurse Communication: Mobility status PT Visit Diagnosis: Other abnormalities of gait and mobility (R26.89);Hemiplegia and hemiparesis;Muscle weakness (generalized) (M62.81) Hemiplegia - Right/Left: Right    Time: 1725-1750 PT Time Calculation (min) (ACUTE ONLY): 25 min   Charges:   PT Evaluation $PT Eval Moderate Complexity: 1 Mod PT Treatments $Therapeutic Activity: 8-22 mins        12/20/2018   Ranae PalmsElizabeth Cristian Davitt, PT   Judson RochHildreth, Renley Gutman Gardner 12/20/2018, 6:10 PM

## 2018-12-20 NOTE — Evaluation (Signed)
Clinical/Bedside Swallow Evaluation Patient Details  Name: Philip Vasquez MRN: [id number] Date of Birth: 08/24/1960  Today's Date: 12/20/2018 Time: SLP Start Time (ACUTE ONLY): 0845 SLP Stop Time (ACUTE ONLY): 0904 SLP Time Calculation (min) (ACUTE ONLY): 19 min  Past Medical History: History reviewed. No pertinent past medical history. Past Surgical History: History reviewed. No pertinent surgical history. HPI:  Philip Vasquez is a 58 year old gentleman with no past medical history who presented with AMS and right-sided weakness. The patient says he woke up at 3 AM today and tried to get up from his bed to use the bathroom and noticed he could not stand. He fell on the floor and his mother called 53 when he could not stand up. His weakness has not improved or worsened since that time. Patient also notes that he is slurring his speech. Nothing like this is ever happened before. He denies hitting his head or losing consciousness when he fell this morning. Patient did admit that he used cocaine last night and uses it on a daily basis. Denies having fevers, visual changes, headache, numbness or tingling, chest pain, shortness of breath, changes in his bowel or bladder habits.  In the ED, the patient underwent a head CT which did not show any acute intracranial abnormalities but was notable for chronic microvascular ischemic changes.  MRI of the head was completed and showing widely scattered small acute infarcts in the medial left hemisphere involving left ACA and left ACA/MCA watershed area.  No associated hemorrhage or mass effect.  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 CBC, BMP, urinalysis, and UDS were obtained.  All are unremarkable for the exception of an elevated creatinine to 1.84 (BL unknown) and UDS which was positive for cocaine.     Assessment / Plan / Recommendation Clinical Impression  Clinical swallowing evaluation was completed using thin liquids via spoon, cup and straw, pureed material and dry solids.  Cranial nerve exam was completed and remarkable for right side facial weakness seen at rest.  In addition, lingual deviation to the left was seen with movement and weakness was noted on the right.  Decreased movement of his left forehead was also seen.  The patient did not endorse any differences in sensation but it was unable to be fully assessed due to his difficulty following commands.  He did not present with overt s/s of oropharyngeal dysphagia.  Mastication of solids appeared to be adequate with no oral residue seen.  Swallow trigger was appreciated to palpation and initially overt s/s of aspiration were not seen.  In addition, he passed the [employer].  Very delayed cough was seen after presentation of all PO trials.  Recommend begin a regular diet with thin liquids.  ST will follow up for therapeutic diet tolerance and determine if instrumental exam will be needed.  If he has any issues with his food trays nursing was encouraged to make the patient NPO and contact speech therapy.  The patient noted to have dysarthric speech.  MD please consider ordering cognitive/language evaluation.  Thank you.   SLP Visit Diagnosis: Dysphagia, unspecified (R13.10)    Aspiration Risk  Mild aspiration risk    Diet Recommendation   Regular with thin liquids  Medication Administration: Whole meds with liquid    Other  Recommendations Oral Care Recommendations: Oral care BID   Follow up Recommendations Other (comment)(TBD)      Frequency and Duration min 2x/week  2 weeks           Swallow Study   General Date of Onset: 12/19/18 HPI: Philip Vasquez is a 58 year old gentleman with no past medical history who presented with AMS and right-sided weakness. The patient says he woke up at 3 AM today and tried to get up from his bed to use the bathroom and noticed he could not stand.  He fell on the floor and his mother called [phone] when he could not stand up. His weakness has not improved or worsened since that time. Patient also notes that he is slurring his speech. Nothing like this is ever happened before. He denies hitting his head or losing consciousness when he fell this morning. Patient did admit that he used cocaine last night and uses it on a daily basis. Denies having fevers, visual changes, headache, numbness or tingling, chest pain, shortness of breath, changes in his bowel or bladder habits.  In the ED, the patient underwent a head CT which did not show any acute intracranial abnormalities but was notable for chronic microvascular ischemic changes.  MRI of the head was completed and showing widely scattered small acute infarcts in the medial left hemisphere involving left ACA and left ACA/MCA watershed area.  No associated hemorrhage or mass effect.  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 CBC, BMP, urinalysis, and UDS were obtained.  All are unremarkable for the exception of an elevated creatinine to 1.84 (BL unknown) and UDS which was positive for cocaine.   Type of Study: Bedside Swallow Evaluation [facility] Diet Prior to this Study: NPO Temperature Spikes Noted: No History of Recent Intubation: No Behavior/Cognition: Alert;Cooperative Oral Cavity Assessment: Within Functional Limits Oral Care Completed by SLP: No Oral Cavity - Dentition: Missing dentition Vision: Functional for self-feeding Self-Feeding Abilities: Able to feed self Patient Positioning: Upright in bed Baseline Vocal Quality: Hoarse Volitional Swallow: Able to elicit    Oral/Motor/Sensory Function Overall Oral Motor/Sensory Function: Mild impairment Facial ROM: Reduced right Facial Symmetry: Abnormal symmetry right Facial Strength: Reduced right Facial  Sensation: Other (Comment)(unable to assess) Lingual ROM: Within Functional Limits Lingual Symmetry: Abnormal symmetry right Lingual Strength: Reduced   Ice Chips Ice chips: Not tested   Thin Liquid Thin Liquid: Within functional limits Presentation: Cup;Spoon;Straw;Self Fed    Nectar Thick Nectar Thick Liquid: Not tested   Honey Thick Honey Thick Liquid: Not tested   Puree Puree: Within functional limits Presentation: Spoon   Solid     Solid: Within functional limits Presentation: Self Fed      Philip AguasMelissa Harith Mccadden, MA, CCC-SLP Acute Rehab SLP [phone]  Philip Vasquez 12/20/2018,9:13 AM

## 2018-12-21 DIAGNOSIS — E785 Hyperlipidemia, unspecified: Secondary | ICD-10-CM | POA: Insufficient documentation

## 2018-12-21 DIAGNOSIS — I10 Essential (primary) hypertension: Secondary | ICD-10-CM | POA: Insufficient documentation

## 2018-12-21 DIAGNOSIS — F1410 Cocaine abuse, uncomplicated: Secondary | ICD-10-CM

## 2018-12-21 HISTORY — DX: Cocaine abuse, uncomplicated: F14.10

## 2018-12-21 LAB — BASIC METABOLIC PANEL
Anion gap: 9 (ref 5–15)
BUN: 15 mg/dL (ref 6–20)
CO2: 22 mmol/L (ref 22–32)
Calcium: 8.6 mg/dL — ABNORMAL LOW (ref 8.9–10.3)
Chloride: 107 mmol/L (ref 98–111)
Creatinine, Ser: 1.69 mg/dL — ABNORMAL HIGH (ref 0.61–1.24)
GFR calc Af Amer: 51 mL/min — ABNORMAL LOW (ref >60)
GFR calc non Af Amer: 44 mL/min — ABNORMAL LOW (ref >60)
Glucose, Bld: 98 mg/dL (ref 70–99)
Potassium: 3.7 mmol/L (ref 3.5–5.1)
Sodium: 138 mmol/L (ref 135–145)

## 2018-12-21 LAB — CBC
HCT: 41.1 % (ref 39.0–52.0)
Hemoglobin: 13.7 g/dL (ref 13.0–17.0)
MCH: 29.8 pg (ref 26.0–34.0)
MCHC: 33.3 g/dL (ref 30.0–36.0)
MCV: 89.3 fL (ref 80.0–100.0)
Platelets: 210 10*3/uL (ref 150–400)
RBC: 4.6 MIL/uL (ref 4.22–5.81)
RDW: 12.9 % (ref 11.5–15.5)
WBC: 6.7 10*3/uL (ref 4.0–10.5)
nRBC: 0 % (ref 0.0–0.2)

## 2018-12-21 LAB — HEMOGLOBIN A1C
Hgb A1c MFr Bld: 6.2 % — ABNORMAL HIGH (ref 4.8–5.6)
Mean Plasma Glucose: 131 mg/dL

## 2018-12-21 MED ORDER — AMLODIPINE BESYLATE 5 MG PO TABS
5.0000 mg | ORAL_TABLET | Freq: Every day | ORAL | Status: DC
  Administered 2018-12-21: 5 mg via ORAL
  Filled 2018-12-21: qty 1

## 2018-12-21 MED ORDER — ENSURE ENLIVE PO LIQD
237.0000 mL | Freq: Two times a day (BID) | ORAL | Status: DC
  Administered 2018-12-21 – 2019-03-02 (×77): 237 mL via ORAL
  Filled 2018-12-21 (×2): qty 237

## 2018-12-21 MED ORDER — HYDRALAZINE HCL 20 MG/ML IJ SOLN
5.0000 mg | INTRAMUSCULAR | Status: DC | PRN
  Administered 2018-12-21 – 2018-12-22 (×3): 5 mg via INTRAVENOUS
  Filled 2018-12-21 (×3): qty 1

## 2018-12-21 MED ORDER — CLOPIDOGREL BISULFATE 75 MG PO TABS
75.0000 mg | ORAL_TABLET | Freq: Every day | ORAL | Status: DC
  Administered 2018-12-21 – 2019-01-05 (×16): 75 mg via ORAL
  Filled 2018-12-21 (×16): qty 1

## 2018-12-21 NOTE — Progress Notes (Signed)
CSW alerted by RN that brother asking about Medicaid. CSW sent email to financial counselor Shelby that patient's brother is at the bedside and will be the patient's contact to assist with Medicaid application. Awaiting response.  Laveda Abbe, [place] Clinical Social Worker [phone]

## 2018-12-21 NOTE — Progress Notes (Addendum)
Subjective: Pt seen at the bedside on rounds this AM.  Patient continues to have  difficulty speaking today.  It is very hard to understand what he is trying to say.  Tried having the patient do thumbs up and thumbs down for yes and no questions but the patient did not follow this command.  Denies chest pain or shortness of breath or abdominal pain today.  Objective: MRI Brain: IMPRESSION: 1. Widely scattered small acute infarcts in the medial left hemisphere involving left ACA and left ACA/MCA watershed area. No associated hemorrhage or mass effect. 2. 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  CT Angio Head: IMPRESSION: Mild atherosclerotic disease at the carotid bifurcation regions but no stenosis or pronounced irregularity.   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  Echocardiogram IMPRESSION:  1. The left ventricle has mildly reduced systolic function, with an ejection fraction of 45-50%. The cavity size was normal. There is mild concentric left ventricular hypertrophy. Left ventricular diastolic Doppler parameters are consistent with  impaired relaxation.  2. The right ventricle has normal systolic function. The cavity was normal. There is no increase in right ventricular wall thickness.  3. The pericardial effusion is circumferential.  4. Trivial pericardial effusion is present.  5. The mitral valve is myxomatous. Moderate thickening of the mitral valve leaflet.  6. The tricuspid valve is grossly normal.  7. The aortic valve is tricuspid. Mild thickening of the aortic valve. Moderate calcification of the aortic valve. No stenosis of the aortic valve.  8. The aorta is normal unless otherwise noted.  9. The aortic root is normal in size and structure. 10. There is evidence of plaque in the aortic root. 11.  Aortic root plaque/sclerosis noted. There is focal calcification noted in the aortic root. 12. The inferior vena cava was normal in size with <50% respiratory variability. 13. When compared to the prior study: No comparison.FINDINGS:  Vital signs in last 24 hours: Vitals:   12/21/18 0400 12/21/18 0414 12/21/18 0600 12/21/18 0857  BP: (!) 166/124 (!) 157/109 (!) 155/117 (!) 176/93  Pulse:  73    Resp: 14 20 19 13   Temp:  99.3 F (37.4 C)    TempSrc:  Oral    SpO2: 95% 98% 99% 99%  Weight:      Height:       Physical Exam: General: Lying in bed, NAD HEENT: NCAT, poor dentition CV: RRR, normal S1-S2, no murmurs rubs or gallops appreciated PULM: Clear to auscultation bilaterally. Transmitted upper airway sounds present ABD: Distended, small periumbilical hernia present.  NEURO:  Mental Status: Patient is awake, alert. Aphagia present Cranial Nerves: II: Pupils equal, round, and reactive to light.   III,IV, VI: EOMI without ptosis or diploplia.  V: Facial sensation is symmetric to gross touch VII: Facial movement is symmetric.  VIII: hearing is intact to voice X: Uvula elevates symmetrically XI: Shoulder shrug is asymmetric, right side diminished XII: tongue is midline without atrophy or fasciculations.  Motor:  3/5 RUE, 5/5 LUE, 0/5 RLE, 5/5 LLE Sensory: Sensation is grossly intact in bilateral UEs & LEs Plantars:  No clonus noted   Assessment/Plan:  Principal Problem:   Cerebral thrombosis with cerebral infarction Active Problems:   Cocaine use disorder (HCC)   Essential hypertension   Hyperlipidemia  In summary Philip Vasquez is a 58 year old gentleman who uses cocaine daily with no  recorded past medical history who presents today after experiencing right sided weakness, speech abnormalities and somnolence, and MRI head findings that showed widely scattered small acute infarcts in the medial left hemisphere involving left ACA and left ACA/MCA watershed area and chronic  small vessel disease.     #R sided weakness #L ACA/MCA  Infarcts:  MRI of the head showed widely scattered small acute infarcts in the medial left hemisphere involving left ACA and left ACA/MCA watershed area in addition to chronic small vessel disease. Neurology is on board and recommend secondary stroke work-up.  - Permissive hypertension upto 220/110 mmHg up to 48 hrs post initial admission (9/14) - Rosuvastatin 40 mg daily  - Aspirin 81 mg daily and Plavix 75 mg daily for 3 weeks (stop date October 5) followed by aspirin 81 mg alone - Hemoglobin A1c 6.2%, elevated but no need for metformin - Continue telemetry - PT/OT/SLP consult, appreciate recommendations.  Recommend CIR but patient is uninsured, so disposition will be a challenge - Follow-up with neurology in 6 weeks in the outpatient setting   #HLD: Lipid panel significant for elevated cholesterol up to 273 and LDL cholesterol to 199.  Needs high intensity statin therapy. - Rosuvastatin 40 mg daily   #AKI #Possible CKD: Cr on admission 1.90, GFR 40. No baseline available. Received 1L LR bolus in the ED.  CR has down trended to 1.60 so far.  The elevated creatinine could suggest presence of CKD as opposed to AKI. - Continue LR 100 cc/hr infusion - Daily BMP   #FEN/GI - Diet: Regular with thin liquids - IVF: None  #DVT prophylaxis - Lovenox 40 mg subq injections   #Dispo: Pending medical course.  PT/OT recommend CIR but patient is uninsured.  Patient's brother will visit today to discuss plan.  [name], MD Internal Medicine, PGY1 Pager: [phone]  12/21/2018,9:03 AM

## 2018-12-21 NOTE — Progress Notes (Signed)
Initial Nutrition Assessment   RD working remotely.  DOCUMENTATION CODES:   Obesity unspecified  INTERVENTION:  Provide Ensure Enlive po BID, each supplement provides 350 kcal and 20 grams of protein.  Encourage adequate PO intake.   NUTRITION DIAGNOSIS:   Inadequate oral intake related to lethargy/confusion as evidenced by estimated needs(reported meal completion).  GOAL:   Patient will meet greater than or equal to 90% of their needs  MONITOR:   PO intake, Supplement acceptance, Skin, Weight trends, Labs, I & O's  REASON FOR ASSESSMENT:   Malnutrition Screening Tool    ASSESSMENT:   58 year old man with hyperlipidemia, hypertension, and cocaine use disorder admitted with right upper and lower extremity weakness admitted with scattered acute infarcts in the medial left ACA and MCA watershed distribution.  Pt with difficulty speaking and ongoing wax and wan per MD. RD unable to obtain pt nutrition history at this time. RD to order nutritional supplements to aid in caloric and protein needs. Unable to complete Nutrition-Focused physical exam at this time.   Labs and medications reviewed.   Diet Order:   Diet Order            Diet regular Room service appropriate? Yes; Fluid consistency: Thin  Diet effective now              EDUCATION NEEDS:   Not appropriate for education at this time  Skin:  Skin Assessment: Reviewed RN Assessment  Last BM:  Unknown  Height:   Ht Readings from Last 1 Encounters:  12/19/18 5\' 9"  (1.753 m)    Weight:   Wt Readings from Last 1 Encounters:  12/19/18 102.1 kg    Ideal Body Weight:  72.7 kg  BMI:  Body mass index is 33.23 kg/m.  Estimated Nutritional Needs:   Kcal:  2000-2200  Protein:  100-115 grams  Fluid:  >/= 2 L/day    Corrin Parker, MS, RD, LDN Pager # [phone] After hours/ weekend pager # [phone]

## 2018-12-21 NOTE — Evaluation (Signed)
Speech Language Pathology Evaluation Patient Details Name: Philip Vasquez MRN: [id number] DOB: [birth year] Today's Date: 12/21/2018 Time: 4580-9983 SLP Time Calculation (min) (ACUTE ONLY): 25 min  Problem List:  Patient Active Problem List   Diagnosis Date Noted  . Cocaine use disorder ([place]) 12/21/2018  . Essential hypertension 12/21/2018  . Hyperlipidemia 12/21/2018  . Cerebral thrombosis with cerebral infarction 12/20/2018   Past Medical History: History reviewed. No pertinent past medical history. Past Surgical History: History reviewed. No pertinent surgical history. HPI:  58 year old gentleman with no past medical history who presented with AMS and right-sided weakness. MRI: Widely scattered small acute infarcts in the medial lefthemisphere involving left ACA and left ACA/MCA watershed area. UA positive for cocaine use.    Assessment / Plan / Recommendation Clinical Impression  Pt appears to exhibit novel deficits in cognitive linguistics. Pt lethargic upon SLP entrance, though aroused with multimodal cues. Pt with inconsistent verbal response to open ended questions and commands. Pt with frequent mumbling, reduced articulation of speech, and appeared to have labored breathing though O2 and RR were within functional limits. Entire PLOF difficult to obtain, pt does report living alone in a multistory home, states he currently does not work. He reports being independent with all ADLs previously and that his mom is nearest contact. Deficits in cognitive abililties included executive function, mildly decreased temporal orientation, reduced initiation/planning, slowed processing, decreased safety awareness, and inattention.  Expressive and receptive language was inconsistent throughout interaction with SLP. Continued intervention indicated to assist cognitive linguistic recovery.    SLP Assessment  SLP Recommendation/Assessment: Patient needs continued Speech Lanaguage Pathology Services SLP  Visit Diagnosis: Cognitive communication deficit (R41.841)    Follow Up Recommendations  24 hour supervision/assistance(pending progress/ PT & OT recs)    Frequency and Duration min 2x/week  2 weeks      SLP Evaluation Cognition  Overall Cognitive Status: Impaired/Different from baseline Arousal/Alertness: Lethargic Orientation Level: Disoriented to time Attention: Focused;Sustained Focused Attention: Impaired Sustained Attention: Impaired Memory: Impaired Awareness: Impaired Problem Solving: Impaired Executive Function: Organizing;Self Monitoring;Initiating Organizing: Impaired Self Monitoring: Impaired Safety/Judgment: Impaired       Comprehension  Auditory Comprehension Overall Auditory Comprehension: Other (comment)(difficult to assess; exhibited delayed processing )    Expression Expression Primary Mode of Expression: Verbal Verbal Expression Overall Verbal Expression: Impaired Initiation: Impaired Interfering Components: Attention;Speech intelligibility Written Expression Dominant Hand: Right   Oral / Motor  Oral Motor/Sensory Function Overall Oral Motor/Sensory Function: Mild impairment Facial ROM: Reduced right Facial Symmetry: Abnormal symmetry right Facial Strength: Reduced right Lingual ROM: Within Functional Limits Lingual Symmetry: Abnormal symmetry right Lingual Strength: Reduced Motor Speech Overall Motor Speech: Impaired Respiration: Impaired Resonance: Within functional limits Articulation: Impaired Intelligibility: Intelligibility reduced Effective Techniques: Slow rate;Over-articulate   GO                    Lyndy Russman E Larene Ascencio MA., CCC-SLP Acute Rehabilitation Services 12/21/2018, 3:02 PM

## 2018-12-21 NOTE — Progress Notes (Signed)
  Speech Language Pathology Treatment: Dysphagia  Patient Details Name: Philip Vasquez MRN: [id number] DOB: 05/22/1960 Today's Date: 12/21/2018 Time: 4098-1191 SLP Time Calculation (min) (ACUTE ONLY): 15 min  Assessment / Plan / Recommendation Clinical Impression  SLP followed up for diet tolerance. RN reports pt with waxing and waning mentation and pt with some difficulty with PO lunch meal this date. SLP assessed with thin liquids, mechanical soft, and regular POs. Pt initially lethargic though awakened with cues. Pt required additional time for mastication and exhibited delayed oral transit. Mild right sided anterior spillage noted with thins via cup. Vocal quality remained clear. No overt s/sx of aspiration. SLP to follow up for PO observation, objective swallow study may be indicated if any further difficulty exhibited.     HPI HPI: 58 year old gentleman with no past medical history who presented with AMS and right-sided weakness. MRI: Widely scattered small acute infarcts in the medial lefthemisphere involving left ACA and left ACA/MCA watershed area. UA positive for cocaine use.       SLP Plan  Continue with current plan of care  Patient needs continued Speech Lanaguage Pathology Services    Recommendations  Diet recommendations: Regular;Thin liquid Medication Administration: Whole meds with puree Supervision: Full supervision/cueing for compensatory strategies;Staff to assist with self feeding Compensations: Slow rate;Small sips/bites;Minimize environmental distractions;Monitor for anterior loss Postural Changes and/or Swallow Maneuvers: Seated upright 90 degrees;Upright 30-60 min after meal                Oral Care Recommendations: Oral care BID Follow up Recommendations: 24 hour supervision/assistance;Other (comment)(pending progress, PT & OT recs) SLP Visit Diagnosis: Dysphagia, oral phase (R13.11) Plan: Continue with current plan of care       [place] MA, CCC-SLP Acute Rehabilitation Services 12/21/2018, 3:06 PM

## 2018-12-21 NOTE — Plan of Care (Signed)
Progressing towards goals

## 2018-12-22 LAB — BASIC METABOLIC PANEL
Anion gap: 11 (ref 5–15)
BUN: 16 mg/dL (ref 6–20)
CO2: 21 mmol/L — ABNORMAL LOW (ref 22–32)
Calcium: 9.3 mg/dL (ref 8.9–10.3)
Chloride: 105 mmol/L (ref 98–111)
Creatinine, Ser: 1.64 mg/dL — ABNORMAL HIGH (ref 0.61–1.24)
GFR calc Af Amer: 53 mL/min — ABNORMAL LOW (ref >60)
GFR calc non Af Amer: 45 mL/min — ABNORMAL LOW (ref >60)
Glucose, Bld: 119 mg/dL — ABNORMAL HIGH (ref 70–99)
Potassium: 3.8 mmol/L (ref 3.5–5.1)
Sodium: 137 mmol/L (ref 135–145)

## 2018-12-22 MED ORDER — HYDRALAZINE HCL 10 MG PO TABS
10.0000 mg | ORAL_TABLET | Freq: Three times a day (TID) | ORAL | Status: DC
  Administered 2018-12-22: 10 mg via ORAL
  Filled 2018-12-22: qty 1

## 2018-12-22 MED ORDER — LABETALOL HCL 100 MG PO TABS
100.0000 mg | ORAL_TABLET | Freq: Three times a day (TID) | ORAL | Status: DC | PRN
  Administered 2018-12-22: 100 mg via ORAL
  Filled 2018-12-22 (×3): qty 1

## 2018-12-22 MED ORDER — HYDRALAZINE HCL 25 MG PO TABS
25.0000 mg | ORAL_TABLET | Freq: Three times a day (TID) | ORAL | Status: DC
  Administered 2018-12-22 – 2018-12-23 (×3): 25 mg via ORAL
  Filled 2018-12-22 (×3): qty 1

## 2018-12-22 MED ORDER — AMLODIPINE BESYLATE 10 MG PO TABS
10.0000 mg | ORAL_TABLET | Freq: Every day | ORAL | Status: DC
  Administered 2018-12-22 – 2019-05-04 (×132): 10 mg via ORAL
  Filled 2018-12-22 (×131): qty 1
  Filled 2018-12-22: qty 2
  Filled 2018-12-22 (×2): qty 1

## 2018-12-22 MED ORDER — HYDRALAZINE HCL 10 MG PO TABS: 10.0000 mg | ORAL_TABLET | ORAL | Status: DC | PRN

## 2018-12-22 MED ORDER — LABETALOL HCL 5 MG/ML IV SOLN: 10.0000 mg | INTRAVENOUS | Status: DC | PRN

## 2018-12-22 MED ORDER — HYDRALAZINE HCL 25 MG PO TABS: 25.0000 mg | ORAL_TABLET | Freq: Three times a day (TID) | ORAL | Status: DC

## 2018-12-22 MED ORDER — HYDRALAZINE HCL 20 MG/ML IJ SOLN: 10.0000 mg | INTRAMUSCULAR | Status: DC | PRN

## 2018-12-22 NOTE — Progress Notes (Signed)
Subjective: Pt seen at the bedside on rounds this AM.  Intermittently responds to questions and commands. It is difficult to communicate weather or not the patient is having any issues today, but does appear oriented. The bedding are soaked in urine.   Objective: MRI Brain: IMPRESSION: 1. Widely scattered small acute infarcts in the medial left hemisphere involving left ACA and left ACA/MCA watershed area. No associated hemorrhage or mass effect. 2. 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  CT Angio Head: IMPRESSION: Mild atherosclerotic disease at the carotid bifurcation regions but no stenosis or pronounced irregularity.   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  Echocardiogram IMPRESSION:  1. The left ventricle has mildly reduced systolic function, with an ejection fraction of 45-50%. The cavity size was normal. There is mild concentric left ventricular hypertrophy. Left ventricular diastolic Doppler parameters are consistent with  impaired relaxation.  2. The right ventricle has normal systolic function. The cavity was normal. There is no increase in right ventricular wall thickness.  3. The pericardial effusion is circumferential.  4. Trivial pericardial effusion is present.  5. The mitral valve is myxomatous. Moderate thickening of the mitral valve leaflet.  6. The tricuspid valve is grossly normal.  7. The aortic valve is tricuspid. Mild thickening of the aortic valve. Moderate calcification of the aortic valve. No stenosis of the aortic valve.  8. The aorta is normal unless otherwise noted.  9. The aortic root is normal in size and structure. 10. There is evidence of plaque in the aortic root. 11. Aortic root plaque/sclerosis noted. There is focal calcification noted in the aortic root. 12. The inferior  vena cava was normal in size with <50% respiratory variability. 13. When compared to the prior study: No comparison.FINDINGS:  Vital signs in last 24 hours: Vitals:   12/21/18 2200 12/22/18 0100 12/22/18 0300 12/22/18 0400  BP: (!) 149/116 (!) 155/120 (!) 176/104 (!) 155/85  Pulse: 76 76 89 81  Resp:   20 18  Temp:      TempSrc:      SpO2:      Weight:      Height:       Physical Exam: General: Lying in bed, NAD HEENT: NCAT, poor dentition CV: RRR, normal S1-S2, no murmurs rubs or gallops appreciated, no edema present in lower extremities PULM: Normal work of breathing  ABD: distended, no tenderness to palpation in all quadrants.  NEURO:  Mental Status: Patient is awake, alert, and oriented x 4 Aphagia present Cranial Nerves: II: Pupils equal, round, and reactive to light.   III,IV, VI: EOMI without ptosis or diploplia.  V: Facial sensation is symmetric to gross touch VII: Facial movement is symmetric.  VIII: hearing is intact to voice X: Uvula elevates symmetrically XI: Shoulder shrug is asymmetric, right side diminished XII: tongue is midline without atrophy or fasciculations.  Motor:  3/5 RUE, 5/5 LUE, 0/5 RLE, 5/5 LLE Sensory: Sensation grossly intact.  Plantars:  Clonus in the R lower extremity for 4-5 beats. Toes down going bilaterally.   Assessment/Plan:  Principal Problem:   Cerebral thrombosis with cerebral infarction Active Problems:   Cocaine use disorder ([facility])   Essential hypertension   Hyperlipidemia  In summary Mr. Rosenboom is a 58 year old gentleman who uses cocaine daily with no recorded past medical history who presents today after experiencing right sided weakness, speech abnormalities  and somnolence, and MRI head findings that showed widely scattered small acute infarcts in the medial left hemisphere involving left ACA and left ACA/MCA watershed area and chronic small vessel disease.     #R sided weakness #L ACA/MCA  Infarcts:  MRI of the head  showed widely scattered small acute infarcts in the medial left hemisphere involving left ACA and left ACA/MCA watershed area in addition to chronic small vessel disease. Neurology is on board and recommend secondary stroke work-up.  - Rosuvastatin 40 mg daily  - Aspirin 81 mg daily and Plavix 75 mg daily for 3 weeks (stop date October 5) followed by aspirin 81 mg alone - Hemoglobin A1c 6.2%, elevated but no need for metformin - Continue telemetry - PT/OT/SLP consult, appreciate recommendations. Recommend CIR but patient is uninsured, so disposition will be a challenge - Follow-up with neurology in 6 weeks in the outpatient setting   #HTN: Out of the permissive hypertension window. Will start BP medications today.  - Amlodipine 5 mg daily - Hydrlazine 10 mg TID  #HLD: Lipid panel significant for elevated cholesterol up to 273 and LDL cholesterol to 199.  Needs high intensity statin therapy. - Rosuvastatin 40 mg daily   #AKI #Possible CKD: Cr on admission 1.90, GFR 40. No baseline available. Received 1L LR bolus in the ED. CR has down trended to 1.60 so far and has plateaued. The elevated creatinine could suggest presence of CKD as opposed to AKI. - Daily BMP   #FEN/GI - Diet: Regular with thin liquids - IVF: None  #DVT prophylaxis - Lovenox 40 mg subq injections   #Dispo: Pending medical course. PT/OT recommend CIR but patient is uninsured.   Kirt BoysAndrew Emmali Karow, MD Internal Medicine, PGY1 Pager: [phone]  12/22/2018,10:11 AM

## 2018-12-22 NOTE — Progress Notes (Addendum)
Physical Therapy Treatment Patient Details Name: Philip Vasquez MRN: [id number] DOB: 05/02/1960 Today's Date: 12/22/2018    History of Present Illness Philip Vasquez is a 58 year old gentleman with no past medical history who presented with AMS and right-sided weakness. MRI: Widely scattered small acute infarcts in the medial lefthemisphere involving left ACA and left ACA/MCA watershed area.    PT Comments    Patient seen for mobility progression. Upon arrival pt sitting up in bed and awake/alert and also diaphoretic. Pt reports feeling hot and talking to therapist at beginning of session. Vitals signs in general comments below. Pt requires mod/max A +2 for bed mobility and able to progress to min guard/min A for sitting balance EOB. Pt presents with R side weakness. After sitting EOB pt became lethargic and not verbally responding to therapist. Transfer training deferred given pt's decreased arousal. Continue to progress as tolerated.    Follow Up Recommendations  CIR;Supervision/Assistance - 24 hour     Equipment Recommendations  Other (comment)(TBD)    Recommendations for Other Services       Precautions / Restrictions Precautions Precautions: Fall Precaution Comments: apneic Restrictions Weight Bearing Restrictions: No    Mobility  Bed Mobility Overal bed mobility: Needs Assistance Bed Mobility: Rolling;Sidelying to Sit;Sit to Supine Rolling: Max assist Sidelying to sit: Mod assist;+2 for physical assistance;HOB elevated   Sit to supine: Max assist;+2 for physical assistance   General bed mobility comments: cues for sequencing and assist at bilat LE and trunk for all bed mobility; use of rail rolling toward R side  Transfers                 General transfer comment: defered given pt became lethargic and not following cues while sitting EOB   Ambulation/Gait                 Stairs             Wheelchair Mobility    Modified Rankin (Stroke Patients  Only)       Balance Overall balance assessment: Needs assistance Sitting-balance support: Single extremity supported;Bilateral upper extremity supported;Feet supported Sitting balance-Leahy Scale: Fair Sitting balance - Comments: pt progressed to sitting EOB with min guard assist                                    Cognition Arousal/Alertness: Lethargic(alert/alert beginning of session) Behavior During Therapy: Flat affect Overall Cognitive Status: Impaired/Different from baseline                                 General Comments: initially pt answering questions however pt became more lethargic throughout session and did not speak again       Exercises      General Comments General comments (skin integrity, edema, etc.): pt with labored breathing throughout; SpO2 remained 95% or > on RA , HR 70s-80s bpm, RR ranging from 24-3, and BP 178/115 (135)      Pertinent Vitals/Pain Pain Assessment: No/denies pain    Home Living                      Prior Function            PT Goals (current goals can now be found in the care plan section) Progress towards PT goals: Progressing toward goals    Frequency  Min 4X/week      PT Plan Current plan remains appropriate    Co-evaluation              AM-PAC PT "6 Clicks" Mobility   Outcome Measure  Help needed turning from your back to your side while in a flat bed without using bedrails?: A Lot Help needed moving from lying on your back to sitting on the side of a flat bed without using bedrails?: A Lot Help needed moving to and from a bed to a chair (including a wheelchair)?: Total Help needed standing up from a chair using your arms (e.g., wheelchair or bedside chair)?: Total Help needed to walk in hospital room?: Total Help needed climbing 3-5 steps with a railing? : Total 6 Click Score: 8    End of Session   Activity Tolerance: Patient tolerated treatment well;Patient  limited by lethargy Patient left: in bed;with bed alarm set;with call bell/phone within reach Nurse Communication: Mobility status PT Visit Diagnosis: Other abnormalities of gait and mobility (R26.89);Hemiplegia and hemiparesis;Muscle weakness (generalized) (M62.81) Hemiplegia - Right/Left: Right     Time: 6808-8110 PT Time Calculation (min) (ACUTE ONLY): 26 min  Charges:  $Therapeutic Activity: 23-37 mins                     Earney Navy, PTA Acute Rehabilitation Services Pager: [phone] Office: [phone]     Darliss Cheney 12/22/2018, 4:34 PM

## 2018-12-23 LAB — BASIC METABOLIC PANEL
Anion gap: 11 (ref 5–15)
BUN: 22 mg/dL — ABNORMAL HIGH (ref 6–20)
CO2: 20 mmol/L — ABNORMAL LOW (ref 22–32)
Calcium: 9 mg/dL (ref 8.9–10.3)
Chloride: 107 mmol/L (ref 98–111)
Creatinine, Ser: 1.74 mg/dL — ABNORMAL HIGH (ref 0.61–1.24)
GFR calc Af Amer: 49 mL/min — ABNORMAL LOW (ref >60)
GFR calc non Af Amer: 42 mL/min — ABNORMAL LOW (ref >60)
Glucose, Bld: 120 mg/dL — ABNORMAL HIGH (ref 70–99)
Potassium: 3.9 mmol/L (ref 3.5–5.1)
Sodium: 138 mmol/L (ref 135–145)

## 2018-12-23 MED ORDER — POLYETHYLENE GLYCOL 3350 17 G PO PACK
17.0000 g | PACK | Freq: Every day | ORAL | Status: DC
  Administered 2018-12-23 – 2018-12-31 (×6): 17 g via ORAL
  Filled 2018-12-23 (×10): qty 1

## 2018-12-23 MED ORDER — ENOXAPARIN SODIUM 40 MG/0.4ML ~~LOC~~ SOLN
40.0000 mg | SUBCUTANEOUS | Status: DC
  Administered 2018-12-23 – 2019-01-05 (×14): 40 mg via SUBCUTANEOUS
  Filled 2018-12-23 (×15): qty 0.4

## 2018-12-23 MED ORDER — LACTATED RINGERS IV SOLN
INTRAVENOUS | Status: DC
  Administered 2018-12-23 – 2018-12-24 (×2): via INTRAVENOUS

## 2018-12-23 MED ORDER — HYDRALAZINE HCL 50 MG PO TABS
50.0000 mg | ORAL_TABLET | Freq: Three times a day (TID) | ORAL | Status: DC
  Administered 2018-12-23 – 2019-01-01 (×29): 50 mg via ORAL
  Filled 2018-12-23 (×29): qty 1

## 2018-12-23 MED ORDER — METOPROLOL TARTRATE 12.5 MG HALF TABLET
12.5000 mg | ORAL_TABLET | Freq: Two times a day (BID) | ORAL | Status: DC
  Administered 2018-12-23 – 2018-12-24 (×4): 12.5 mg via ORAL
  Filled 2018-12-23 (×4): qty 1

## 2018-12-23 MED ORDER — DOCUSATE SODIUM 100 MG PO CAPS
100.0000 mg | ORAL_CAPSULE | Freq: Once | ORAL | Status: AC
  Administered 2018-12-23: 100 mg via ORAL
  Filled 2018-12-23: qty 1

## 2018-12-23 NOTE — Progress Notes (Signed)
Subjective: Pt seen at the bedside on rounds this AM.  Patient sitting on top of bedside commode.Patient does not endorse any pain but it is difficult to get him to respond to questions consistently.  Frequently groaning throughout the visit.  Last bowel movement was yesterday.  Objective:  MRI Brain: IMPRESSION: 1. Widely scattered small acute infarcts in the medial left hemisphere involving left ACA and left ACA/MCA watershed area. No associated hemorrhage or mass effect. 2. 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  CT Angio Head: IMPRESSION: Mild atherosclerotic disease at the carotid bifurcation regions but no stenosis or pronounced irregularity.   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  Echocardiogram IMPRESSION:  1. The left ventricle has mildly reduced systolic function, with an ejection fraction of 45-50%. The cavity size was normal. There is mild concentric left ventricular hypertrophy. Left ventricular diastolic Doppler parameters are consistent with  impaired relaxation.  2. The right ventricle has normal systolic function. The cavity was normal. There is no increase in right ventricular wall thickness.  3. The pericardial effusion is circumferential.  4. Trivial pericardial effusion is present.  5. The mitral valve is myxomatous. Moderate thickening of the mitral valve leaflet.  6. The tricuspid valve is grossly normal.  7. The aortic valve is tricuspid. Mild thickening of the aortic valve. Moderate calcification of the aortic valve. No stenosis of the aortic valve.  8. The aorta is normal unless otherwise noted.  9. The aortic root is normal in size and structure. 10. There is evidence of plaque in the aortic root. 11. Aortic root plaque/sclerosis noted. There is focal calcification noted in the  aortic root. 12. The inferior vena cava was normal in size with <50% respiratory variability. 13. When compared to the prior study: No comparison.FINDINGS:  Vital signs in last 24 hours: Vitals:   12/22/18 2145 12/22/18 2345 12/23/18 0342 12/23/18 0400  BP: (!) 161/104 (!) 162/92 (!) 159/117   Pulse:  79 77   Resp: 12 17 18 19   Temp:  98.7 F (37.1 C) 98.2 F (36.8 C)   TempSrc:  Oral Oral   SpO2: 95% 95% 90% 91%  Weight:      Height:       Physical Exam: General: Lying in bed, NAD HEENT: NCAT, poor dentition CV: RRR, normal S1-S2, no murmurs rubs or gallops appreciated, no edema present in lower extremities PULM: Clear to auscultation bilaterally ABD: distended, no tenderness to palpation in all quadrants.  NEURO:  Mental Status: Patient is awake, difficult to assess orientation due to patient's inconsistent responses. Aphagia present Cranial Nerves: II: Pupils equal, round, and reactive to light.   III,IV, VI: EOMI without ptosis or diploplia.  V: Facial sensation is symmetric to gross touch VII: Facial movement is symmetric.  VIII: hearing is intact to voice X: Uvula elevates symmetrically XI: Shoulder shrug is asymmetric, right side diminished XII: tongue is midline without atrophy or fasciculations.  Motor:  0/5 RUE, 5/5 LUE, 0/5 RLE, 5/5 LLE Sensory: Difficult to assess sensation gross touch  Assessment/Plan:  Principal Problem:   Cerebral thrombosis with cerebral infarction Active Problems:   Cocaine use disorder ([facility])   Essential hypertension   Hyperlipidemia  In summary Philip Vasquez is a 58 year old gentleman who uses cocaine daily with no recorded past medical history who presents today after experiencing right sided weakness, speech abnormalities and  somnolence, and MRI head findings that showed widely scattered small acute infarcts in the medial left hemisphere involving left ACA and left ACA/MCA watershed area and chronic small vessel disease.   #R sided  weakness #L ACA/MCA  Infarcts:  MRI of the head showed widely scattered small acute infarcts in the medial left hemisphere involving left ACA and left ACA/MCA watershed area in addition to chronic small vessel disease. Neurology is on board and recommend secondary stroke work-up.  - Rosuvastatin 40 mg daily  - Aspirin 81 mg daily and Plavix 75 mg daily for 3 weeks (stop date October 5) followed by aspirin 81 mg alone - Hemoglobin A1c 6.2%, elevated but no need for metformin - Continue telemetry - PT/OT/SLP consult, appreciate recommendations. Recommend CIR but patient is uninsured, so disposition will be a challenge - Follow-up with neurology in 6 weeks in the outpatient setting  #HTN: Out of the permissive hypertension window. BP has been significantly elevated at 159/117 this AM.  - Metoprolol 12.5 mg BID today - Amlodipine 10 mg daily - Hydrlazine 50 mg TID - Labetalol 100 mg TID PRN  #HLD: Lipid panel significant for elevated cholesterol up to 273 and LDL cholesterol to 199.  Needs high intensity statin therapy. - Rosuvastatin 40 mg daily  #AKI #Possible CKD: Cr on admission 1.90, GFR 40. No baseline available. Received 1L LR bolus in the ED. CR has down trended to 1.60 so far and has plateaued. The elevated creatinine could suggest presence of CKD as opposed to AKI. - Daily BMP  #FEN/GI - Diet: Regular with thin liquids, ensure feeding supplement - IVF: LR 75 cc/hr continuous   #DVT prophylaxis - Lovenox 40 mg subq injections  #Dispo: Pending medical course. PT/OT recommend CIR but patient is uninsured.   Philip BoysAndrew Sharissa Brierley, MD Internal Medicine, PGY1 Pager: [phone]  12/23/2018,10:04 AM

## 2018-12-23 NOTE — Progress Notes (Signed)
Inpatient Rehabilitation-Admissions Coordinator   [facility] continues to follow pt progress with therapies. Will need to see greater participation and tolerance. If there is evidence pt is able to tolerate up to three hours of therapy a day, I will request consult order.   Jhonnie Garner, OTR/L  Rehab Admissions Coordinator  [phone] 12/23/2018 12:43 PM

## 2018-12-23 NOTE — TOC Initial Note (Signed)
Transition of Care [facility]) - Initial/Assessment Note    Patient Details  Name: Philip Vasquez MRN: [id number] Date of Birth: Jun 30, 1960  Transition of Care [facility]) CM/SW Contact:    Pollie Friar, RN Phone Number: 12/23/2018, 11:56 AM  Clinical Narrative:                 CM met with patient and brother at the bedside. Brother concerned about getting medicaid started. CM emailed Crystal with financial counseling with brothers name, number and times.  Brother Legrand Como) states there is not family available to help him in the current condition he is in. Legrand Como states that if he receives some rehab and is stronger he may be able to come together with family to have the support the patient would need.  CM discussed with Legrand Como that Pt most likely would not get into SNF for rehab d/t lack of insurance and being positive for cocaine on admission. He voiced understanding. Pt is without insurance, PCP. CM will follow for assistance with PCP and meds.    Expected Discharge Plan: IP Rehab Facility Barriers to Discharge: Continued Medical Work up, Inadequate or no insurance   Patient Goals and CMS Choice        Expected Discharge Plan and Services Expected Discharge Plan: IP Rehab Facility In-house Referral: Clinical Social Work Discharge Planning Services: CM Consult                                          Prior Living Arrangements/Services   Lives with:: Parents Patient language and need for interpreter reviewed:: Yes(no needs) Do you feel safe going back to the place where you live?: Yes      Need for Family Participation in Patient Care: Yes (Comment)(24 hour supervision and support) Care giver support system in place?: No (comment)(pt's brother states noone in family can provide the assistance he currently needs)   Criminal Activity/Legal Involvement Pertinent to Current Situation/Hospitalization: No - Comment as needed  Activities of Daily Living Home Assistive  Devices/Equipment: None ADL Screening (condition at time of admission) Patient's cognitive ability adequate to safely complete daily activities?: Yes Is the patient deaf or have difficulty hearing?: No Does the patient have difficulty seeing, even when wearing glasses/contacts?: No Does the patient have difficulty concentrating, remembering, or making decisions?: Yes Patient able to express need for assistance with ADLs?: Yes Does the patient have difficulty dressing or bathing?: No Independently performs ADLs?: Yes (appropriate for developmental age) Does the patient have difficulty walking or climbing stairs?: No Weakness of Legs: Right Weakness of Arms/Hands: Right  Permission Sought/Granted                  Emotional Assessment Appearance:: Appears stated age Attitude/Demeanor/Rapport: Lethargic Affect (typically observed): Quiet Orientation: : Oriented to Self Alcohol / Substance Use: Illicit Drugs, Alcohol Use Psych Involvement: No (comment)  Admission diagnosis:  Abdominal distension [R14.0] Right sided weakness [R53.1] Hypertension, unspecified type [I10] Patient Active Problem List   Diagnosis Date Noted  . Cocaine use disorder ([place]) 12/21/2018  . Essential hypertension 12/21/2018  . Hyperlipidemia 12/21/2018  . Cerebral thrombosis with cerebral infarction 12/20/2018   PCP:  Patient, No Pcp Per Pharmacy:   [facility], [place] [address] Phone: [phone] Fax: [phone]     Social Determinants of Health (SDOH) Interventions  Readmission Risk Interventions No flowsheet data found.

## 2018-12-23 NOTE — Progress Notes (Signed)
  Speech Language Pathology Treatment: Dysphagia;Cognitive-Linquistic  Patient Details Name: Philip Vasquez MRN: [id number] DOB: [birth year] Today's Date: 12/23/2018 Time: 0174-9449 SLP Time Calculation (min) (ACUTE ONLY): 9 min  Assessment / Plan / Recommendation Clinical Impression  SWALLOWING Pt consumed regular solids and thin liquids independently.  Pt took large bites of regular solid, but achieved adequate oral clearance given extra time.  Pt exhibited reflexive cough x1 following serial sips (6+) of thin liquid.  Recommend continuing regular diet with thin liquid with small, single sips.  Of note, pt c/o bad throat pain (unable to assign number, bu appeared in distress, moaning, jiggling foot), which improved with sips of liquid.  COGNITION Pt answered 5 of 5 orientation questions correctly.  Pt was unable to verbalize call bell use.  With visual presentation of remote, selected correct button to page nurse. Pt was unable to state reasons call nurse.  Pt correctly answered 2 of 3 yes/no questions about reasons to call nurse.  After short (~3-5 minute) delay pt could not recall how to use call bell. Pt's attention was impacted in part by pain (see above), and pt then fell asleep during session.  Pt appeared to be resting free from pain so SLP terminated session.   HPI HPI: 58 year old gentleman with no past medical history who presented with AMS and right-sided weakness. MRI: Widely scattered small acute infarcts in the medial lefthemisphere involving left ACA and left ACA/MCA watershed area. UA positive for cocaine use.       SLP Plan  Continue with current plan of care       Recommendations  Diet recommendations: Regular;Thin liquid Liquids provided via: Straw;Cup Medication Administration: Whole meds with puree Supervision: Full supervision/cueing for compensatory strategies;Staff to assist with self feeding Compensations: Slow rate;Small sips/bites;Minimize environmental  distractions;Monitor for anterior loss Postural Changes and/or Swallow Maneuvers: Seated upright 90 degrees;Upright 30-60 min after meal                Oral Care Recommendations: Oral care BID Follow up Recommendations: 24 hour supervision/assistance;Other (comment) SLP Visit Diagnosis: Dysphagia, oral phase (R13.11) Plan: Continue with current plan of care       [place], [place], [place] Office: [phone] 12/23/2018, 12:28 PM

## 2018-12-23 NOTE — Progress Notes (Signed)
Occupational Therapy Treatment Patient Details Name: Philip Vasquez MRN: [id number] DOB: 1960-05-19 Today's Date: 12/23/2018    History of present illness [name] is a 58 year old gentleman with no past medical history who presented with AMS and right-sided weakness. MRI: Widely scattered small acute infarcts in the medial lefthemisphere involving left ACA and left ACA/MCA watershed area.   OT comments  Pt progressing. Pt total A +2 for all bed mobility this session; pt did reach and hold onto rail with cues, but did not assist to mobilize. Pt with labored breathing requiring frequent reminders to attend to task, breathe easier and to reduce holding breath. Pt >94% on RA with exertion; O2 placed back on as pt desats to 91% resting. Pt attempting sit to stand x2 times maxA +2 as stedy not available and pt unsteady due to R lateral lean, but L side very strong and assisting with movement.  Pt did not assist with ADL task today with RUE or LUE to wash face. Pt fatigued- must be from medications. Pt would benefit from continued OT skilled services for ADL,mobility and safety. OT following acutely. Expecting pt to increase alertness and assist with ADL and mobility to tolerate CIR therapies.   Follow Up Recommendations  CIR;Supervision/Assistance - 24 hour    Equipment Recommendations  Other (comment);Wheelchair (measurements OT);Wheelchair cushion (measurements OT)(to be determined)    Recommendations for Other Services      Precautions / Restrictions Precautions Precautions: Fall Precaution Comments: apneic Restrictions Weight Bearing Restrictions: No       Mobility Bed Mobility Overal bed mobility: Needs Assistance Bed Mobility: Rolling;Sidelying to Sit;Sit to Supine           General bed mobility comments: total A +2 for all bed mobility this session; pt did reach and hold onto rail with cues but did not assist to mobilize  Transfers Overall transfer level: Needs  assistance Equipment used: Rolling walker (2 wheeled) Transfers: Sit to/from Stand Sit to Stand: Max assist;+2 physical assistance;From elevated surface         General transfer comment: defered given pt became lethargic and not following cues while sitting EOB     Balance Overall balance assessment: Needs assistance Sitting-balance support: Single extremity supported;Bilateral upper extremity supported;Feet supported Sitting balance-Leahy Scale: Fair Sitting balance - Comments: pt progressed to sitting EOB with min guard assist Postural control: Right lateral lean Standing balance support: Bilateral upper extremity supported;During functional activity Standing balance-Leahy Scale: Zero                             ADL either performed or assessed with clinical judgement   ADL Overall ADL's : Needs assistance/impaired Eating/Feeding: Total assistance   Grooming: Total assistance   Upper Body Bathing: Total assistance   Lower Body Bathing: Total assistance   Upper Body Dressing : Total assistance   Lower Body Dressing: Total assistance   Toilet Transfer: Total assistance;+2 for physical assistance;+2 for safety/equipment Toilet Transfer Details (indicate cue type and reason): attempting sit to stand- stedy not available and pt unsteady due to R lateral lean Toileting- Clothing Manipulation and Hygiene: Total assistance       Functional mobility during ADLs: Maximal assistance;+2 for physical assistance;+2 for safety/equipment;Rolling walker;Cueing for safety;Cueing for sequencing General ADL Comments: total A; pt unable to arouse for more than a few seconds     Vision   Vision Assessment?: Vision impaired- to be further tested in functional context Additional Comments: unable  to stay awake for more than a few seconds   Perception     Praxis      Cognition Arousal/Alertness: Lethargic Behavior During Therapy: Flat affect Overall Cognitive Status:  Impaired/Different from baseline                                 General Comments: pt did not try to speak this session despite cues and answering questions with grunts        Exercises     Shoulder Instructions       General Comments Pt with labored breathing requiring frequent reminders to attend to task, breathe easier and to reduce holding breath. Pt >94% on RA with exertion; O2 placed back on as pt desats to 91% resting.    Pertinent Vitals/ Pain       Pain Assessment: Faces Faces Pain Scale: No hurt  Home Living                                          Prior Functioning/Environment              Frequency  Min 2X/week        Progress Toward Goals  OT Goals(current goals can now be found in the care plan section)  Progress towards OT goals: Progressing toward goals  Acute Rehab OT Goals Patient Stated Goal: pt unable to state due to expressive difficulties and lethargy OT Goal Formulation: Patient unable to participate in goal setting Time For Goal Achievement: 01/03/19 Potential to Achieve Goals: Fair ADL Goals Pt Will Perform Grooming: with min assist;sitting Pt Will Perform Upper Body Bathing: with min assist;sitting Pt Will Transfer to Toilet: with min assist;with +2 assist;stand pivot transfer;bedside commode Pt/caregiver will Perform Home Exercise Program: Increased ROM;Right Upper extremity;With written HEP provided Additional ADL Goal #1: Pt will be able to roll left and right with min A to help with basic ADLs Additional ADL Goal #2: Pt will be able to come up to sit EOB with min A in prep for transfers and EOB ADLs Additional ADL Goal #3: Pt will be able to locate items asked if him without cues  Plan Discharge plan remains appropriate    Co-evaluation    PT/OT/SLP Co-Evaluation/Treatment: Yes Reason for Co-Treatment: Complexity of the patient's impairments (multi-system involvement) PT goals addressed during  session: Mobility/safety with mobility;Balance OT goals addressed during session: ADL's and self-care      AM-PAC OT "6 Clicks" Daily Activity     Outcome Measure   Help from another person eating meals?: Total Help from another person taking care of personal grooming?: Total Help from another person toileting, which includes using toliet, bedpan, or urinal?: Total Help from another person bathing (including washing, rinsing, drying)?: Total Help from another person to put on and taking off regular upper body clothing?: Total Help from another person to put on and taking off regular lower body clothing?: Total 6 Click Score: 6    End of Session Equipment Utilized During Treatment: Gait belt;Rolling walker  OT Visit Diagnosis: Other abnormalities of gait and mobility (R26.89);Muscle weakness (generalized) (M62.81);Low vision, both eyes (H54.2);Other symptoms and signs involving cognitive function;Cognitive communication deficit (R41.841);Hemiplegia and hemiparesis Symptoms and signs involving cognitive functions: Cerebral infarction Hemiplegia - Right/Left: Right Hemiplegia - dominant/non-dominant: Dominant Hemiplegia - caused by: Cerebral infarction  Activity Tolerance Patient limited by fatigue;Treatment limited secondary to medical complications (Comment)   Patient Left in bed;with call bell/phone within reach;with bed alarm set   Nurse Communication Mobility status        Time: 6144-3154 OT Time Calculation (min): 29 min  Charges: OT General Charges $OT Visit: 1 Visit OT Treatments $Neuromuscular Re-education: 8-22 mins  Cristi Loron) Glendell Docker OTR/L Acute Rehabilitation Services Pager: [phone] Office: [phone]    Lonzo Cloud 12/23/2018, 4:38 PM

## 2018-12-23 NOTE — Progress Notes (Signed)
Physical Therapy Treatment Patient Details Name: Philip Vasquez MRN: [id number] DOB: 02/17/1961 Today's Date: 12/23/2018    History of Present Illness (P) [name] is a 58 year old gentleman with no past medical history who presented with AMS and right-sided weakness. MRI: Widely scattered small acute infarcts in the medial lefthemisphere involving left ACA and left ACA/MCA watershed area.    PT Comments    Patient seen for mobility progression. Pt requires +2 assist for all mobility this session. Pt continues to be lethargic but wanting to try to stand. Pt stood briefly X 2 trials with max A +2 due to heavy R lateral bias. Attempt functional transfer training with Stedy next session. Bariatric Stedy not available at time of session.  Continue to progress as tolerated.     Follow Up Recommendations  CIR;Supervision/Assistance - 24 hour     Equipment Recommendations  Other (comment)(TBD)    Recommendations for Other Services       Precautions / Restrictions Precautions Precautions: (P) Fall Precaution Comments: (P) apneic Restrictions Weight Bearing Restrictions: (P) No    Mobility  Bed Mobility Overal bed mobility: (P) Needs Assistance Bed Mobility: (P) Rolling;Sidelying to Sit;Sit to Supine           General bed mobility comments: (P) total A +2 for all bed mobility this session; pt did reach and hold onto rail with cues but did not assist to mobilize  Transfers Overall transfer level: (P) Needs assistance Equipment used: (P) Rolling walker (2 wheeled) Transfers: (P) Sit to/from Stand Sit to Stand: Max assist;+2 physical assistance;From elevated surface         General transfer comment: (P) defered given pt became lethargic and not following cues while sitting EOB   Ambulation/Gait                 Stairs             Wheelchair Mobility    Modified Rankin (Stroke Patients Only)       Balance Overall balance assessment: (P) Needs  assistance Sitting-balance support: (P) Single extremity supported;Bilateral upper extremity supported;Feet supported Sitting balance-Leahy Scale: (P) Fair Sitting balance - Comments: (P) pt progressed to sitting EOB with min guard assist Postural control: Right lateral lean Standing balance support: Bilateral upper extremity supported;During functional activity Standing balance-Leahy Scale: Zero                              Cognition Arousal/Alertness: (P) Lethargic Behavior During Therapy: (P) Flat affect Overall Cognitive Status: (P) Impaired/Different from baseline                                 General Comments: (P) pt did not try to speak this session despite cues and answering questions with grunts      Exercises      General Comments        Pertinent Vitals/Pain Pain Assessment: Faces Faces Pain Scale: (P) No hurt    Home Living                      Prior Function            PT Goals (current goals can now be found in the care plan section) Acute Rehab PT Goals Patient Stated Goal: (P) pt unable to state due to expressive difficulties Progress towards PT goals: Progressing toward goals  Frequency    Min 4X/week      PT Plan Current plan remains appropriate    Co-evaluation PT/OT/SLP Co-Evaluation/Treatment: Yes Reason for Co-Treatment: Complexity of the patient's impairments (multi-system involvement);Necessary to address cognition/behavior during functional activity;For patient/therapist safety;To address functional/ADL transfers PT goals addressed during session: Mobility/safety with mobility;Balance        AM-PAC PT "6 Clicks" Mobility   Outcome Measure  Help needed turning from your back to your side while in a flat bed without using bedrails?: A Lot Help needed moving from lying on your back to sitting on the side of a flat bed without using bedrails?: A Lot Help needed moving to and from a bed to a  chair (including a wheelchair)?: Total Help needed standing up from a chair using your arms (e.g., wheelchair or bedside chair)?: A Lot Help needed to walk in hospital room?: Total Help needed climbing 3-5 steps with a railing? : Total 6 Click Score: 9    End of Session Equipment Utilized During Treatment: Gait belt Activity Tolerance: Patient tolerated treatment well;Patient limited by lethargy Patient left: in bed;with bed alarm set;with call bell/phone within reach Nurse Communication: Mobility status PT Visit Diagnosis: Other abnormalities of gait and mobility (R26.89);Hemiplegia and hemiparesis;Muscle weakness (generalized) (M62.81) Hemiplegia - Right/Left: Right     Time: 5277-8242 PT Time Calculation (min) (ACUTE ONLY): 29 min  Charges:  $Gait Training: 8-22 mins                     Earney Navy, PTA Acute Rehabilitation Services Pager: [phone] Office: [phone]     Darliss Cheney 12/23/2018, 4:31 PM

## 2018-12-24 LAB — BASIC METABOLIC PANEL
Anion gap: 10 (ref 5–15)
BUN: 26 mg/dL — ABNORMAL HIGH (ref 6–20)
CO2: 20 mmol/L — ABNORMAL LOW (ref 22–32)
Calcium: 8.7 mg/dL — ABNORMAL LOW (ref 8.9–10.3)
Chloride: 108 mmol/L (ref 98–111)
Creatinine, Ser: 1.69 mg/dL — ABNORMAL HIGH (ref 0.61–1.24)
GFR calc Af Amer: 51 mL/min — ABNORMAL LOW (ref >60)
GFR calc non Af Amer: 44 mL/min — ABNORMAL LOW (ref >60)
Glucose, Bld: 111 mg/dL — ABNORMAL HIGH (ref 70–99)
Potassium: 4.2 mmol/L (ref 3.5–5.1)
Sodium: 138 mmol/L (ref 135–145)

## 2018-12-24 NOTE — Plan of Care (Signed)
Patient stable, discussed POC with patient, agreeable with plan, denies question/concerns at this time. Pts mother updated on POC, spoke w/pt via telephone.

## 2018-12-24 NOTE — Progress Notes (Signed)
Subjective: Pt seen at the bedside on rounds this AM.  Brother was at the bedside as well.  We asked the patient how he is doing and he says okay.  When we asked the patient if he is in pain he appears to say yes, however it is unclear what he is trying to say.  Objective:  MRI Brain: IMPRESSION: 1. Widely scattered small acute infarcts in the medial left hemisphere involving left ACA and left ACA/MCA watershed area. No associated hemorrhage or mass effect. 2. 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  CT Angio Head: IMPRESSION: Mild atherosclerotic disease at the carotid bifurcation regions but no stenosis or pronounced irregularity.   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  Echocardiogram IMPRESSION:  1. The left ventricle has mildly reduced systolic function, with an ejection fraction of 45-50%. The cavity size was normal. There is mild concentric left ventricular hypertrophy. Left ventricular diastolic Doppler parameters are consistent with  impaired relaxation.  2. The right ventricle has normal systolic function. The cavity was normal. There is no increase in right ventricular wall thickness.  3. The pericardial effusion is circumferential.  4. Trivial pericardial effusion is present.  5. The mitral valve is myxomatous. Moderate thickening of the mitral valve leaflet.  6. The tricuspid valve is grossly normal.  7. The aortic valve is tricuspid. Mild thickening of the aortic valve. Moderate calcification of the aortic valve. No stenosis of the aortic valve.  8. The aorta is normal unless otherwise noted.  9. The aortic root is normal in size and structure. 10. There is evidence of plaque in the aortic root. 11. Aortic root plaque/sclerosis noted. There is focal calcification noted in the aortic root. 12.  The inferior vena cava was normal in size with <50% respiratory variability. 13. When compared to the prior study: No comparison.FINDINGS:  Vital signs in last 24 hours: Vitals:   12/23/18 2000 12/23/18 2103 12/23/18 2307 12/24/18 0303  BP: (!) 144/88  124/80 (!) 128/99  Pulse:   65 69  Resp: (!) 22 16  13   Temp: 98.6 F (37 C)  98 F (36.7 C) 99.1 F (37.3 C)  TempSrc: Oral  Oral Oral  SpO2: 92% 93%  90%  Weight:      Height:       Physical Exam: General: Lying in bed, NAD HEENT: NCAT, poor dentition CV: RRR, normal S1-S2, no murmurs rubs or gallops appreciated, no edema present in lower extremities PULM: Clear to auscultation bilaterally ABD: distended, no tenderness to palpation in all quadrants. MSK: Left lower extremity hamstring tightness, decreased range of motion  NEURO:  Mental Status: Patient is awake Aphagia present Cranial Nerves:   Motor: 1/5 RUE, 5/5 LUE, 0/5 RLE, 5/5 LLE Sensory: Intact to gross touch in bilateral upper and lower extremities  Assessment/Plan:  Principal Problem:   Cerebral thrombosis with cerebral infarction Active Problems:   Cocaine use disorder ([facility])   Essential hypertension   Hyperlipidemia  In summary [name] is a 58 year old gentleman who uses cocaine daily with no recorded past medical history who presents today after experiencing right sided weakness, speech abnormalities and somnolence, and MRI head findings that showed widely scattered small acute infarcts in the medial left hemisphere involving left ACA and left ACA/MCA watershed area and chronic small vessel disease.   His deficits are right-sided weakness and dysarthria.  #  Dysarthria #R sided weakness #L ACA/MCA  Infarcts:  MRI of the head showed widely scattered small acute infarcts in the medial left hemisphere involving left ACA and left ACA/MCA watershed area in addition to chronic small vessel disease. Neurology is on board and recommend secondary stroke work-up.  -  Rosuvastatin 40 mg daily  - Aspirin 81 mg daily and Plavix 75 mg daily for 3 weeks (stop date October 5) followed by aspirin 81 mg alone - Hemoglobin A1c 6.2%, elevated but no need for metformin - PT/OT/SLP consult, appreciate recommendations. Recommend CIR but patient is uninsured, so disposition will be a challenge - Follow-up with neurology in 6 weeks in the outpatient setting  #HTN: Out of the permissive hypertension window. BP has been significantly elevated at 159/117 this AM.  - Metoprolol 12.5 mg BID today - Amlodipine 10 mg daily - Hydrlazine 50 mg TID - Labetalol 100 mg TID PRN  #HLD: Lipid panel significant for elevated cholesterol up to 273 and LDL cholesterol to 199.  Needs high intensity statin therapy. - Rosuvastatin 40 mg daily  #AKI #Possible CKD: Cr on admission 1.90, GFR 40. No baseline available. Received 1L LR bolus in the ED. CR has down trended to 1.69 so far and has plateaued. The elevated creatinine could suggest presence of CKD as opposed to AKI. - Daily BMP  #FEN/GI - Diet: Regular with thin liquids, ensure feeding supplement - IVF: d/c LR 75 cc/hr today  #DVT prophylaxis - Lovenox 40 mg subq injections  #Dispo: Pending medical course. PT/OT recommend CIR but patient is uninsured. Working with CSW for placement.   [name], MD Internal Medicine, PGY1 Pager: [phone]  12/24/2018,2:00 PM

## 2018-12-24 NOTE — Progress Notes (Signed)
Physical Therapy Treatment Patient Details Name: Philip Vasquez MRN: [id number] DOB: 04/16/1960 Today's Date: 12/24/2018    History of Present Illness [name] is a 58 year old gentleman with no past medical history who presented with AMS and right-sided weakness. MRI: Widely scattered small acute infarcts in the medial lefthemisphere involving left ACA and left ACA/MCA watershed area.    PT Comments    Patient seen for mobility progression. Pt stood X 2 with Stedy standing frame with mod/max A +2. Continue to progress as tolerated.    Follow Up Recommendations  CIR;Supervision/Assistance - 24 hour     Equipment Recommendations  Other (comment)(TBD)    Recommendations for Other Services       Precautions / Restrictions Precautions Precautions: Fall Precaution Comments: apneic Restrictions Weight Bearing Restrictions: No    Mobility  Bed Mobility Overal bed mobility: Needs Assistance Bed Mobility: Rolling;Sidelying to Sit;Sit to Supine Rolling: Mod assist;+2 for physical assistance;+2 for safety/equipment Sidelying to sit: Mod assist;+2 for physical assistance;HOB elevated   Sit to supine: Max assist;+2 for physical assistance   General bed mobility comments: cues for sequencing and use of rail on R side; assist to bring R LE/hips to EOB and to elevate trunk into sitting   Transfers Overall transfer level: Needs assistance Equipment used: Rolling walker (2 wheeled) Transfers: Sit to/from Stand Sit to Stand: Max assist;+2 physical assistance;From elevated surface;Mod assist         General transfer comment: mod A +2 for initial stand and max A +2 for second stand from Stedy standing frame due to heavy anterior and R lateral bias  Ambulation/Gait                 Stairs             Wheelchair Mobility    Modified Rankin (Stroke Patients Only)       Balance Overall balance assessment: Needs assistance Sitting-balance support: Single extremity  supported;Bilateral upper extremity supported;Feet supported Sitting balance-Leahy Scale: Poor   Postural control: Right lateral lean Standing balance support: Bilateral upper extremity supported;During functional activity Standing balance-Leahy Scale: Zero                              Cognition Arousal/Alertness: Lethargic;Awake/alert(awake/alert beginning of session) Behavior During Therapy: Flat affect;Impulsive Overall Cognitive Status: Impaired/Different from baseline Area of Impairment: Attention;Following commands;Awareness;Safety/judgement;Problem solving                   Current Attention Level: Sustained   Following Commands: Follows one step commands inconsistently Safety/Judgement: Decreased awareness of safety;Decreased awareness of deficits   Problem Solving: Slow processing;Difficulty sequencing;Requires verbal cues;Requires tactile cues        Exercises      General Comments        Pertinent Vitals/Pain Pain Assessment: Faces Faces Pain Scale: Hurts a little bit Pain Location: "all over" Pain Descriptors / Indicators: Sore Pain Intervention(s): Limited activity within patient's tolerance;Monitored during session;Repositioned    Home Living                      Prior Function            PT Goals (current goals can now be found in the care plan section) Progress towards PT goals: Progressing toward goals    Frequency    Min 4X/week      PT Plan Current plan remains appropriate    Co-evaluation  AM-PAC PT "6 Clicks" Mobility   Outcome Measure  Help needed turning from your back to your side while in a flat bed without using bedrails?: A Lot Help needed moving from lying on your back to sitting on the side of a flat bed without using bedrails?: A Lot Help needed moving to and from a bed to a chair (including a wheelchair)?: Total Help needed standing up from a chair using your arms (e.g.,  wheelchair or bedside chair)?: A Lot Help needed to walk in hospital room?: Total Help needed climbing 3-5 steps with a railing? : Total 6 Click Score: 9    End of Session Equipment Utilized During Treatment: Gait belt Activity Tolerance: Patient tolerated treatment well;Patient limited by lethargy Patient left: in bed;with bed alarm set;with call bell/phone within reach Nurse Communication: Mobility status PT Visit Diagnosis: Other abnormalities of gait and mobility (R26.89);Hemiplegia and hemiparesis;Muscle weakness (generalized) (M62.81) Hemiplegia - Right/Left: Right     Time: 7544-9201 PT Time Calculation (min) (ACUTE ONLY): 26 min  Charges:  $Gait Training: 8-22 mins $Therapeutic Activity: 8-22 mins                     Earney Navy, PTA Acute Rehabilitation Services Pager: [phone] Office: [phone]     Darliss Cheney 12/24/2018, 2:30 PM

## 2018-12-25 LAB — BASIC METABOLIC PANEL
Anion gap: 12 (ref 5–15)
BUN: 22 mg/dL — ABNORMAL HIGH (ref 6–20)
CO2: 21 mmol/L — ABNORMAL LOW (ref 22–32)
Calcium: 9.1 mg/dL (ref 8.9–10.3)
Chloride: 105 mmol/L (ref 98–111)
Creatinine, Ser: 1.56 mg/dL — ABNORMAL HIGH (ref 0.61–1.24)
GFR calc Af Amer: 56 mL/min — ABNORMAL LOW (ref >60)
GFR calc non Af Amer: 48 mL/min — ABNORMAL LOW (ref >60)
Glucose, Bld: 107 mg/dL — ABNORMAL HIGH (ref 70–99)
Potassium: 4.2 mmol/L (ref 3.5–5.1)
Sodium: 138 mmol/L (ref 135–145)

## 2018-12-25 LAB — CBC
HCT: 44.9 % (ref 39.0–52.0)
Hemoglobin: 14.7 g/dL (ref 13.0–17.0)
MCH: 30.2 pg (ref 26.0–34.0)
MCHC: 32.7 g/dL (ref 30.0–36.0)
MCV: 92.2 fL (ref 80.0–100.0)
Platelets: 239 10*3/uL (ref 150–400)
RBC: 4.87 MIL/uL (ref 4.22–5.81)
RDW: 13 % (ref 11.5–15.5)
WBC: 6.9 10*3/uL (ref 4.0–10.5)
nRBC: 0 % (ref 0.0–0.2)

## 2018-12-25 MED ORDER — METOPROLOL TARTRATE 25 MG PO TABS
25.0000 mg | ORAL_TABLET | Freq: Two times a day (BID) | ORAL | Status: DC
  Administered 2018-12-25 – 2019-06-14 (×343): 25 mg via ORAL
  Filled 2018-12-25 (×345): qty 1

## 2018-12-25 NOTE — Progress Notes (Signed)
  Speech Language Pathology Treatment: Cognitive-Linquistic  Patient Details Name: Philip Vasquez MRN: [id number] DOB: [birth year] Today's Date: 12/25/2018 Time: 8295-6213 SLP Time Calculation (min) (ACUTE ONLY): 23 min  Assessment / Plan / Recommendation Clinical Impression  Pt was seen for cognitive-linguistic treatment and was cooperative during the session with a flat affect. He provided 3-6 items per concrete category when mod cues were given. He completed responsive naming with 40% accuracy increasing to 80% with verbal and phonemic cues. He demonstrated sentence completion with 100% accuracy. He required mod-max cues for completion of concrete and abstract reasoning tasks. He achieved 25% accuracy with problem solving related to safety increasing to 50% with mod-max cues. Pt's solution to numerous problems/situations was to call the police. SLP will continue to follow pt.    HPI HPI: 58 year old gentleman with no past medical history who presented with AMS and right-sided weakness. MRI: Widely scattered small acute infarcts in the medial lefthemisphere involving left ACA and left ACA/MCA watershed area. UA positive for cocaine use.       SLP Plan  Continue with current plan of care       Recommendations                   Follow up Recommendations: 24 hour supervision/assistance;Inpatient Rehab SLP Visit Diagnosis: Cognitive communication deficit (Y86.578) Plan: Continue with current plan of care       Breigh Annett I. Hardin Negus, [place], [place] Office number [phone] Pager [place] 12/25/2018, 4:22 PM

## 2018-12-25 NOTE — Progress Notes (Signed)
Subjective: Pt seen at the bedside on rounds this AM. Patient evaluated at bedside, PT with patient. Per PT, patient is not able to support himself on his right side. Patient groans, does not respond to questions.  The bed is soaked in urine, will order condom cath.  Objective:  MRI Brain: IMPRESSION: 1. Widely scattered small acute infarcts in the medial left hemisphere involving left ACA and left ACA/MCA watershed area. No associated hemorrhage or mass effect. 2. 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  CT Angio Head: IMPRESSION: Mild atherosclerotic disease at the carotid bifurcation regions but no stenosis or pronounced irregularity.   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  Echocardiogram IMPRESSION:  1. The left ventricle has mildly reduced systolic function, with an ejection fraction of 45-50%. The cavity size was normal. There is mild concentric left ventricular hypertrophy. Left ventricular diastolic Doppler parameters are consistent with  impaired relaxation.  2. The right ventricle has normal systolic function. The cavity was normal. There is no increase in right ventricular wall thickness.  3. The pericardial effusion is circumferential.  4. Trivial pericardial effusion is present.  5. The mitral valve is myxomatous. Moderate thickening of the mitral valve leaflet.  6. The tricuspid valve is grossly normal.  7. The aortic valve is tricuspid. Mild thickening of the aortic valve. Moderate calcification of the aortic valve. No stenosis of the aortic valve.  8. The aorta is normal unless otherwise noted.  9. The aortic root is normal in size and structure. 10. There is evidence of plaque in the aortic root. 11. Aortic root plaque/sclerosis noted. There is focal calcification noted in the aortic root.  12. The inferior vena cava was normal in size with <50% respiratory variability. 13. When compared to the prior study: No comparison.FINDINGS:  Vital signs in last 24 hours: Vitals:   12/24/18 1534 12/24/18 1900 12/24/18 2340 12/25/18 0321  BP: (!) 144/80 (!) 158/97 (!) 129/98 (!) 151/94  Pulse: 72 75 64 66  Resp: 15 18 18 18   Temp: 98.1 F (36.7 C) 98.8 F (37.1 C) 98.7 F (37.1 C) 98.4 F (36.9 C)  TempSrc: Oral Oral Oral Oral  SpO2: 93% 94% 95% 96%  Weight:      Height:       Physical Exam: General: Lying in bed, NAD HEENT: NCAT, poor dentition CV: RRR, normal S1-S2, no murmurs rubs or gallops appreciated, no edema present in lower extremities PULM: Clear to auscultation bilaterally ABD: distended, no tenderness to palpation in all quadrants. NEURO: Alert but unable to assess orientation because patient does not respond. Diffuse left-sided weakness.  Assessment/Plan:  Principal Problem:   Cerebral thrombosis with cerebral infarction Active Problems:   Cocaine use disorder (HCC)   Essential hypertension   Hyperlipidemia  In summary Philip Vasquez is a 58 year old gentleman who uses cocaine daily with no recorded past medical history who presents today after experiencing right sided weakness, speech abnormalities and somnolence, and MRI head findings that showed widely scattered small acute infarcts in the medial left hemisphere involving left ACA and left ACA/MCA watershed area and chronic small vessel disease.   His deficits are right-sided weakness and dysarthria.  #Dysarthria #R sided weakness #L ACA/MCA  Infarcts:  MRI of the head showed widely scattered small acute infarcts in the medial left hemisphere involving left ACA and left ACA/MCA watershed area in addition  to chronic small vessel disease. Neurology is on board and recommend secondary stroke work-up.  - Rosuvastatin 40 mg daily  - Aspirin 81 mg daily and Plavix 75 mg daily for 3 weeks (stop date October 5)  followed by aspirin 81 mg alone - Hemoglobin A1c 6.2%, elevated but no need for metformin - PT/OT/SLP consult, appreciate recommendations. Recommend CIR but patient is uninsured, so disposition will be a challenge - Follow-up with neurology in 6 weeks in the outpatient setting  #HTN: Out of the permissive hypertension window. BP has been significantly elevated at 159/117 this AM.  - Increase Metoprolol 25 mg BID today - Amlodipine 10 mg daily - Hydrlazine 50 mg TID - Labetalol 100 mg TID PRN  #HLD: Lipid panel significant for elevated cholesterol up to 273 and LDL cholesterol to 199. Needs high intensity statin therapy. - Rosuvastatin 40 mg daily  #AKI #CKD?: Cr on admission 1.90, GFR 40. No baseline available. Received 1L LR bolus in the ED. CR has down trended to 1.56 so far and has plateaued. The elevated creatinine could suggest presence of CKD as opposed to AKI. - Daily BMP  #FEN/GI - Diet: Regular with thin liquids, ensure feeding supplement - IVF: none, will monitor I/O   #DVT prophylaxis - Lovenox 40 mg subq injections  #Dispo: Pending medical course. PT/OT recommend CIR but patient is uninsured. Working with CSW for placement.  Philip BoysAndrew Shamela Haydon, MD Internal Medicine, PGY1 Pager: [phone]  12/25/2018,9:59 AM

## 2018-12-25 NOTE — Progress Notes (Signed)
Physical Therapy Treatment Patient Details Name: Philip Vasquez MRN: [id number] DOB: December 18, 1960 Today's Date: 12/25/2018    History of Present Illness Mr. Hansell is a 58 year old gentleman with no past medical history who presented with AMS and right-sided weakness. MRI: Widely scattered small acute infarcts in the medial lefthemisphere involving left ACA and left ACA/MCA watershed area.    PT Comments    Patient seen for mobility progression. Pt is much more awake/alert throughout session today. Denna Haggard lift used for functional transfer training and pt transferred OOB to recliner. Pt is able to maintain standing with Denna Haggard support and mod A for 5-10 mins total. Continue to progress as tolerated.     Follow Up Recommendations  CIR;Supervision/Assistance - 24 hour     Equipment Recommendations  Other (comment)(TBD)    Recommendations for Other Services       Precautions / Restrictions Precautions Precautions: Fall Precaution Comments: apneic Restrictions Weight Bearing Restrictions: No    Mobility  Bed Mobility Overal bed mobility: Needs Assistance Bed Mobility: Rolling;Sidelying to Sit Rolling: Mod assist;+2 for physical assistance;+2 for safety/equipment Sidelying to sit: Mod assist;+2 for physical assistance;HOB elevated       General bed mobility comments: cues for sequencing and for use of rail; assist to reach toward R side and to bring LE off EOB and elevate trunk into sitting   Transfers Overall transfer level: Needs assistance   Transfers: Sit to/from Stand Sit to Stand: Mod assist         General transfer comment: Denna Haggard used for sit to stand transfer; pt able to maintain standing with assistance to maintain R UE placement and for R hip extension  Ambulation/Gait             General Gait Details: unable at this time   Stairs             Wheelchair Mobility    Modified Rankin (Stroke Patients Only)       Balance Overall  balance assessment: Needs assistance Sitting-balance support: Single extremity supported;Bilateral upper extremity supported;Feet supported Sitting balance-Leahy Scale: Poor Sitting balance - Comments: varying levels of assist needed; pt able to maintain sitting balance for short periods with min guard/min A Postural control: Right lateral lean Standing balance support: Bilateral upper extremity supported;During functional activity Standing balance-Leahy Scale: Zero                              Cognition Arousal/Alertness: Awake/alert Behavior During Therapy: Flat affect Overall Cognitive Status: Impaired/Different from baseline Area of Impairment: Attention;Following commands;Safety/judgement;Problem solving                   Current Attention Level: Sustained   Following Commands: Follows one step commands with increased time Safety/Judgement: Decreased awareness of safety;Decreased awareness of deficits   Problem Solving: Difficulty sequencing;Requires verbal cues;Requires tactile cues;Slow processing General Comments: pt much more alert throughout session today; pt answering questions with grunts until end of session and pt replied " I like them like this" in reference to having legs elevated in recliner and then stated "I just want to go home"      Exercises      General Comments        Pertinent Vitals/Pain Pain Assessment: Faces Faces Pain Scale: Hurts a little bit Pain Location: unspecified(some grimacing with bed mobility) Pain Descriptors / Indicators: Discomfort;Grimacing Pain Intervention(s): Monitored during session;Repositioned    Home Living  Prior Function            PT Goals (current goals can now be found in the care plan section) Progress towards PT goals: Progressing toward goals    Frequency    Min 4X/week      PT Plan Current plan remains appropriate    Co-evaluation               AM-PAC PT "6 Clicks" Mobility   Outcome Measure  Help needed turning from your back to your side while in a flat bed without using bedrails?: A Lot Help needed moving from lying on your back to sitting on the side of a flat bed without using bedrails?: A Lot Help needed moving to and from a bed to a chair (including a wheelchair)?: Total Help needed standing up from a chair using your arms (e.g., wheelchair or bedside chair)?: A Lot Help needed to walk in hospital room?: Total Help needed climbing 3-5 steps with a railing? : Total 6 Click Score: 9    End of Session Equipment Utilized During Treatment: Gait belt Activity Tolerance: Patient tolerated treatment well Patient left: with call bell/phone within reach;in chair;with chair alarm set Nurse Communication: Mobility status;Need for lift equipment PT Visit Diagnosis: Other abnormalities of gait and mobility (R26.89);Hemiplegia and hemiparesis;Muscle weakness (generalized) (M62.81) Hemiplegia - Right/Left: Right     Time: 0910-1000 PT Time Calculation (min) (ACUTE ONLY): 50 min  Charges:  $Gait Training: 23-37 mins $Therapeutic Activity: 8-22 mins                     Erline LevineKellyn Addalynn Kumari, PTA Acute Rehabilitation Services Pager: [phone] Office: [phone]     Carolynne EdouardKellyn R Felicha Frayne 12/25/2018, 12:10 PM

## 2018-12-26 DIAGNOSIS — R4702 Dysphasia: Secondary | ICD-10-CM

## 2018-12-26 NOTE — Progress Notes (Signed)
   Subjective: The patient was lying in his bed today (in the room asleep.  He denies pain, states that he does need assistance with and understands his current condition.  We explained patient's disposition and prognosis at bedside.  Objective:  Vital signs in last 24 hours: Vitals:   12/25/18 1634 12/25/18 2012 12/25/18 2352 12/26/18 0407  BP: (!) 153/88 (!) 153/84 (!) 149/89 (!) 145/95  Pulse: 66  62 71  Resp: (!) 21 14 14 14   Temp: 98.7 F (37.1 C) 98.8 F (37.1 C) 98.2 F (36.8 C) 98.7 F (37.1 C)  TempSrc: Oral Oral Oral Oral  SpO2: 93% 95% 99% 99%  Weight:      Height:       General: Alert, in no acute distress, afebrile, nondiaphoretic HEENT: PEERL, EMO intact Cardio: RRR, no mrg's  Pulmonary: CTA bilaterally, no wheezing or crackles  Abdomen: Bowel sounds normal, soft, nontender  MSK: BLE nontender, nonedematous Neuro: Alert, strength intact on left upper/lower, 0/5 in the right upper and lower extremities Psych: Flat affect, attempts to engage  Assessment/Plan:  Principal Problem:   Cerebral thrombosis with cerebral infarction Active Problems:   Cocaine use disorder [facility])   Essential hypertension   Hyperlipidemia  Mr. Madole is a 58 year old male with a past medical history significant for a person who uses cocaine.  Patient initially presented with acute onset right-sided weakness, dysarthria and somnolence.  He was found to have watershed infarcts on MRI involving the left ACA and ACA/MCA areas.  This is most likely secondary to cocaine induced vasospasm and explains his right-sided deficits.  Unfortunately, due to being uninsured his disposition has been a challenge as he will need aggressive rehabilitation.  He will need 24/7 assistance and rehabilitation moving forward.  A/P: CV: Patient has residual 0/5 strength in the right, persistent dysarthria and mild dysphasia.  Interaction is limited by the patient's dysarthria.  He is unable to easily feed himself  and needs assistance with this.  Recommend he work on disposition for rehabilitation as he does not have sufficient family support to qualify for CIR. -Continue rosuvastatin 40 mg daily -Continue ASA 81 mg daily, clopidogrel 75 mg daily (stop date of birth fifth) - Continue PT/OT/SLP rehabilitation, appreciate their continued assistance and recommendations - He will need to follow-up with neurology in 4 to 6 weeks  HTN: We will continue to adjust patient's antihypertensives to improve his hypertension.  However, given this that this a chronic process, slow titration of medications to achieve normal range within 2 to 6 weeks is preferred over aggressive titration in the hospital setting. -Continue with metoprolol 25 mg twice daily -Continue loaded pain 10 mg daily -Continue hydralazine 50 mg 3 times daily -Continue labetalol 100 mg 3 times daily as needed   AKI: Creatinine 1.9 on admission.  This trend down to 1.56 prior day.  We will repeat the renal function panel every 2 to 3 days as indicated. -Repeat BMP on 9/20 2020  DVT prophylaxis: Lovenox Diet: Regular with thin liquids Code: Full Fluids: N/A Dispo: Anticipated discharge in approximately until a rehab bed is available.   Kathi Ludwig, MD 12/26/2018, 6:45 AM Pager: # [phone]

## 2018-12-27 DIAGNOSIS — I63512 Cerebral infarction due to unspecified occlusion or stenosis of left middle cerebral artery: Secondary | ICD-10-CM

## 2018-12-27 DIAGNOSIS — I10 Essential (primary) hypertension: Secondary | ICD-10-CM

## 2018-12-27 DIAGNOSIS — R471 Dysarthria and anarthria: Secondary | ICD-10-CM

## 2018-12-27 LAB — BASIC METABOLIC PANEL
Anion gap: 12 (ref 5–15)
BUN: 32 mg/dL — ABNORMAL HIGH (ref 6–20)
CO2: 22 mmol/L (ref 22–32)
Calcium: 9.5 mg/dL (ref 8.9–10.3)
Chloride: 104 mmol/L (ref 98–111)
Creatinine, Ser: 1.75 mg/dL — ABNORMAL HIGH (ref 0.61–1.24)
GFR calc Af Amer: 49 mL/min — ABNORMAL LOW (ref >60)
GFR calc non Af Amer: 42 mL/min — ABNORMAL LOW (ref >60)
Glucose, Bld: 112 mg/dL — ABNORMAL HIGH (ref 70–99)
Potassium: 4.4 mmol/L (ref 3.5–5.1)
Sodium: 138 mmol/L (ref 135–145)

## 2018-12-27 MED ORDER — ORAL CARE MOUTH RINSE
15.0000 mL | Freq: Two times a day (BID) | OROMUCOSAL | Status: DC
  Administered 2018-12-27 – 2019-06-13 (×279): 15 mL via OROMUCOSAL

## 2018-12-27 NOTE — Progress Notes (Signed)
Subjective: Pt seen at the bedside on rounds this AM.  With patient says "he hurts all over" but is unable to provide further description.  Denies chest pain or shortness of breath.  Appears to be comfortable.  Objective:  MRI Brain: IMPRESSION: 1. Widely scattered small acute infarcts in the medial left hemisphere involving left ACA and left ACA/MCA watershed area. No associated hemorrhage or mass effect. 2. 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  CT Angio Head: IMPRESSION: Mild atherosclerotic disease at the carotid bifurcation regions but no stenosis or pronounced irregularity.   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  Echocardiogram IMPRESSION:  1. The left ventricle has mildly reduced systolic function, with an ejection fraction of 45-50%. The cavity size was normal. There is mild concentric left ventricular hypertrophy. Left ventricular diastolic Doppler parameters are consistent with  impaired relaxation.  2. The right ventricle has normal systolic function. The cavity was normal. There is no increase in right ventricular wall thickness.  3. The pericardial effusion is circumferential.  4. Trivial pericardial effusion is present.  5. The mitral valve is myxomatous. Moderate thickening of the mitral valve leaflet.  6. The tricuspid valve is grossly normal.  7. The aortic valve is tricuspid. Mild thickening of the aortic valve. Moderate calcification of the aortic valve. No stenosis of the aortic valve.  8. The aorta is normal unless otherwise noted.  9. The aortic root is normal in size and structure. 10. There is evidence of plaque in the aortic root. 11. Aortic root plaque/sclerosis noted. There is focal calcification noted in the aortic root. 12. The inferior vena cava was normal in size with <50%  respiratory variability. 13. When compared to the prior study: No comparison.FINDINGS:  Vital signs in last 24 hours: Vitals:   12/26/18 2018 12/27/18 0035 12/27/18 0431 12/27/18 0750  BP: (!) 146/94 (!) 156/101 (!) 148/102 (!) 156/98  Pulse: 76 67 82 78  Resp: 18 18 19 18   Temp: 98.8 F (37.1 C) 98.8 F (37.1 C) 98.5 F (36.9 C) (!) 97.4 F (36.3 C)  TempSrc: Oral Oral Oral Oral  SpO2: 100% 96% 94% 96%  Weight:      Height:       Physical Exam: General: Laying in bed watching TV, NAD HEENT: NCAT CV: RRR, normal S1-S2, no murmurs rubs or gallops appreciated, no edema present in lower extremities PULM: Clear to auscultation bilaterally ABD: Nontender to palpation diffusely NEURO: Alert, intermittently appropriately responds to questions. Diffuse R-sided weakness Assessment/Plan:  Principal Problem:   Cerebral thrombosis with cerebral infarction Active Problems:   Cocaine use disorder (HCC)   Essential hypertension   Hyperlipidemia  In summary [name] is a 58 year old gentleman who uses cocaine daily with no recorded past medical history who presents today after experiencing right sided weakness, speech abnormalities and somnolence, and MRI head findings that showed widely scattered small acute infarcts in the medial left hemisphere involving left ACA and left ACA/MCA watershed area and chronic small vessel disease. His deficits are right-sided weakness and dysarthria.  #Dysarthria #R sided weakness #L ACA/MCA  Infarcts:  MRI of the head showed widely scattered small acute infarcts in the medial left hemisphere involving left ACA and left ACA/MCA watershed area in addition to chronic small vessel disease.  Neurology was consulted and is signed off of the patient at this time. - Continue  rosuvastatin 40 mg daily  - Continue aspirin 81 mg daily and Plavix 75 mg daily for 3 weeks (stop date October 5) followed by aspirin 81 mg alone - Hemoglobin A1c 6.2%, elevated but no need  for metformin at this time. - PT/OT/SLP consulted. Recommend CIR but patient is uninsured, so disposition is unknown. - Follow-up with neurology in 6 weeks in the outpatient setting  #HTN: Out of the permissive hypertension window. BP has been significantly elevated at 159/117 this AM.  - Continue metoprolol 25 mg BID - Continue amlodipine 10 mg daily - Continue Hydrlazine 50 mg TID - Continue labetalol 100 mg TID PRN  #AKI #CKD?: Cr on admission 1.90, GFR 40. No baseline available. Received 1L LR bolus in the ED. CR has down trended to 1.56 but increased to 1.75 today.  - Daily BMP  #FEN/GI - Diet: Regular with thin liquids, ensure feeding supplement - IVF: none, will monitor I/O   #DVT prophylaxis - Lovenox 40 mg subq injections  #Dispo: Pending medical course. PT/OT recommend CIR but patient is uninsured. Working with CSW for placement.  [name], MD Internal Medicine, PGY1 Pager: [phone]  12/27/2018,11:30 AM

## 2018-12-28 MED ORDER — BACLOFEN 10 MG PO TABS
5.0000 mg | ORAL_TABLET | Freq: Three times a day (TID) | ORAL | Status: DC
  Administered 2018-12-28 – 2019-02-10 (×134): 5 mg via ORAL
  Administered 2019-02-10: 19:00:00 via ORAL
  Administered 2019-02-11 – 2019-02-15 (×15): 5 mg via ORAL
  Filled 2018-12-28 (×151): qty 1

## 2018-12-28 NOTE — Progress Notes (Signed)
Physical Therapy Treatment Patient Details Name: Philip Vasquez MRN: [id number] DOB: Aug 27, 1960 Today's Date: 12/28/2018    History of Present Illness Philip Vasquez is a 58 year old gentleman with no past medical history who presented with AMS and right-sided weakness. MRI: Widely scattered small acute infarcts in the medial lefthemisphere involving left ACA and left ACA/MCA watershed area.    PT Comments    Patient seen for mobility progression. Pt tolerated increased time in standing this session using Dora and able to work on weight shifting and upright posture while in standing. Pt transferred EOB to [facility] and then to recliner.  Pt talking a little more during session compared to previous session but often needs cues for attempting to use words instead of grunting when asked questions. Continue to progress as tolerated.    Follow Up Recommendations  CIR;Supervision/Assistance - 24 hour     Equipment Recommendations  Other (comment)(TBD)    Recommendations for Other Services       Precautions / Restrictions Precautions Precautions: Fall Restrictions Weight Bearing Restrictions: No    Mobility  Bed Mobility Overal bed mobility: Needs Assistance Bed Mobility: Rolling;Sidelying to Sit Rolling: Mod assist Sidelying to sit: Mod assist;+2 for physical assistance;HOB elevated       General bed mobility comments: cues for sequencing; assist to bring hips to EOB and to elevate trunk into sitting; use of rail   Transfers Overall transfer level: Needs assistance   Transfers: Sit to/from Stand           General transfer comment: sara lift utilized for sit to stand transfers from EOB and BSC; assist needed to maintain upright posture and R UE positioning for safe use of lift  Ambulation/Gait             General Gait Details: unable at this time   Stairs             Wheelchair Mobility    Modified Rankin (Stroke Patients Only) Modified Rankin (Stroke  Patients Only) Modified Rankin: Severe disability     Balance Overall balance assessment: Needs assistance Sitting-balance support: Single extremity supported;Bilateral upper extremity supported;Feet supported Sitting balance-Leahy Scale: Poor Sitting balance - Comments: assist for positioning of R UE and approximation through R UE in sitting EOB  Postural control: Right lateral lean Standing balance support: Bilateral upper extremity supported;During functional activity Standing balance-Leahy Scale: Zero Standing balance comment: assist for hip extension and maintaining R UE placement on Sara Lift while in standing; worked on lateral weight shifting                             Cognition Arousal/Alertness: Awake/alert Behavior During Therapy: Flat affect;Impulsive Overall Cognitive Status: Impaired/Different from baseline Area of Impairment: Attention;Following commands;Safety/judgement;Problem solving                   Current Attention Level: Sustained   Following Commands: Follows one step commands with increased time Safety/Judgement: Decreased awareness of safety;Decreased awareness of deficits   Problem Solving: Difficulty sequencing;Requires verbal cues;Requires tactile cues;Slow processing        Exercises      General Comments        Pertinent Vitals/Pain Pain Assessment: No/denies pain    Home Living                      Prior Function            PT Goals (current  goals can now be found in the care plan section) Progress towards PT goals: Progressing toward goals    Frequency    Min 4X/week      PT Plan Current plan remains appropriate    Co-evaluation              AM-PAC PT "6 Clicks" Mobility   Outcome Measure  Help needed turning from your back to your side while in a flat bed without using bedrails?: A Lot Help needed moving from lying on your back to sitting on the side of a flat bed without using  bedrails?: A Lot Help needed moving to and from a bed to a chair (including a wheelchair)?: Total Help needed standing up from a chair using your arms (e.g., wheelchair or bedside chair)?: A Lot Help needed to walk in hospital room?: Total Help needed climbing 3-5 steps with a railing? : Total 6 Click Score: 9    End of Session Equipment Utilized During Treatment: Gait belt Activity Tolerance: Patient tolerated treatment well Patient left: with call bell/phone within reach;in chair;with chair alarm set Nurse Communication: Mobility status;Need for lift equipment PT Visit Diagnosis: Other abnormalities of gait and mobility (R26.89);Hemiplegia and hemiparesis;Muscle weakness (generalized) (M62.81) Hemiplegia - Right/Left: Right     Time: 1100-1130 PT Time Calculation (min) (ACUTE ONLY): 30 min  Charges:  $Gait Training: 8-22 mins $Therapeutic Activity: 8-22 mins                     Erline LevineKellyn Lashawnna Lambrecht, PTA Acute Rehabilitation Services Pager: [phone] Office: [phone]     Carolynne EdouardKellyn R Mende [place] 12/28/2018, 3:31 PM

## 2018-12-28 NOTE — TOC Progression Note (Signed)
Transition of Care [facility] [place]) - Progression Note    Patient Details  Name: [name] MRN: [id number] Date of Birth: June 05, 1960  Transition of Care [facility]) CM/SW [place], [place] Phone Number: 12/28/2018, 4:25 PM  Clinical Narrative:   CSW contacted financial counselor Crystal to discuss patient's Medicaid workup. Per Crystal, brother completed the Medicaid screening on Friday and Crystal placed Medicaid application forms in the mail to the brother on Friday. Brother will receive them this week, complete and mail back to [employer] for Chief Operating Officer. Crystal also sent a referral to the [facility] for disability on Friday.  CSW asked by brother to call him. CSW spoke with brother Legrand Como, who wanted to make sure CSW was aware that his brother hasn't been fed by the nursing staff. Legrand Como said he spoke with Unit AD while he was here earlier and addressed it. CSW indicated to Legrand Como that Unit AD would be who to address that concern, not CSW.     Expected Discharge Plan: IP Rehab Facility Barriers to Discharge: Active Substance Use - Placement, Inadequate or no insurance, SNF Pending payor source - LOG, SNF Pending bed offer, No SNF bed, SNF Pending Medicaid  Expected Discharge Plan and Services Expected Discharge Plan: [place] In-house Referral: Clinical Social Work Discharge Planning Services: CM Consult                                           Social Determinants of Health (SDOH) Interventions    Readmission Risk Interventions No flowsheet data found.

## 2018-12-28 NOTE — Progress Notes (Signed)
Subjective: Pt seen at the bedside on rounds this AM. Patient says he is feeling ok but has some muscle tightness. Discussed starting muscle relaxant to help with symptoms. Patient states he ate breakfast, breakfast at bedside and not eaten. Bed is wet with urine.   Objective:  MRI Brain: IMPRESSION: 1. Widely scattered small acute infarcts in the medial left hemisphere involving left ACA and left ACA/MCA watershed area. No associated hemorrhage or mass effect. 2. 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  CT Angio Head: IMPRESSION: Mild atherosclerotic disease at the carotid bifurcation regions but no stenosis or pronounced irregularity.   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  Echocardiogram IMPRESSION:  1. The left ventricle has mildly reduced systolic function, with an ejection fraction of 45-50%. The cavity size was normal. There is mild concentric left ventricular hypertrophy. Left ventricular diastolic Doppler parameters are consistent with  impaired relaxation.  2. The right ventricle has normal systolic function. The cavity was normal. There is no increase in right ventricular wall thickness.  3. The pericardial effusion is circumferential.  4. Trivial pericardial effusion is present.  5. The mitral valve is myxomatous. Moderate thickening of the mitral valve leaflet.  6. The tricuspid valve is grossly normal.  7. The aortic valve is tricuspid. Mild thickening of the aortic valve. Moderate calcification of the aortic valve. No stenosis of the aortic valve.  8. The aorta is normal unless otherwise noted.  9. The aortic root is normal in size and structure. 10. There is evidence of plaque in the aortic root. 11. Aortic root plaque/sclerosis noted. There is focal calcification noted in the aortic root.  12. The inferior vena cava was normal in size with <50% respiratory variability. 13. When compared to the prior study: No comparison.  Vital signs in last 24 hours: Vitals:   12/27/18 2005 12/28/18 0004 12/28/18 0403 12/28/18 0932  BP: (!) 163/91 (!) 126/94 (!) 143/94 (!) 136/101  Pulse: 81 70 75 76  Resp: 19 18 17  (!) 24  Temp: 98.4 F (36.9 C) 98 F (36.7 C) 97.9 F (36.6 C) 97.6 F (36.4 C)  TempSrc: Oral Oral Oral Oral  SpO2: 98% 95% 98% 96%  Weight:      Height:       Physical Exam: General: Laying in bed watching TV, NAD HEENT: NCAT CV: RRR, normal S1-S2, no murmurs rubs or gallops appreciated, no edema present in lower extremities PULM: Clear to auscultation bilaterally ABD: Nontender to palpation diffusely NEURO: Alert, intermittently appropriately responds to questions. Diffuse R-sided weakness Assessment/Plan:  Principal Problem:   Cerebral thrombosis with cerebral infarction Active Problems:   Cocaine use disorder (HCC)   Essential hypertension   Hyperlipidemia  In summary Philip Vasquez is a 58 year old gentleman who uses cocaine daily with no recorded past medical history who presents after experiencing right sided weakness, speech abnormalities and somnolence, and MRI head findings that showed widely scattered small acute infarcts in the medial left hemisphere involving left ACA and left ACA/MCA watershed area and chronic small vessel disease. His current deficits are right-sided weakness and dysarthria.  #Dysarthria #R sided weakness #L ACA/MCA  Infarcts:  MRI of the head showed widely scattered small acute infarcts in the medial left hemisphere involving left ACA and left ACA/MCA watershed area in addition to chronic small vessel disease. Neurology was consulted and is signed off of  the patient at this time. - Continue rosuvastatin 40 mg daily  - Continue aspirin 81 mg daily and Plavix 75 mg daily for 3 weeks (stop date October 5) followed by aspirin 81 mg alone  - Hemoglobin A1c 6.2%, elevated but no need for metformin at this time. - PT/OT/SLP consulted. Recommend CIR but patient is uninsured, so disposition is unknown.  - Follow-up with neurology in 6 weeks in the outpatient setting  #HTN: Out of the permissive hypertension window. BP has been significantly elevated at 136/101 this AM.  - Continue metoprolol 25 mg BID - Continue amlodipine 10 mg daily - Continue Hydrlazine 50 mg TID - Continue labetalol 100 mg TID PRN  #AKI #CKD?: Cr on admission 1.90, GFR 40. No baseline available. Received 1L LR bolus in the ED. CR has down trended to 1.56 but increased to 1.75 today.  - Every other day BMP  #FEN/GI - Diet: Regular with thin liquids, Ensure feeding supplement - IVF: none, will monitor I/O   #DVT prophylaxis - Lovenox 40 mg subq injections  #Dispo: Pending medical course. PT/OT recommend CIR but patient is uninsured. Working with CSW for placement.  [name], MD Internal Medicine, PGY1 Pager: [phone]  12/28/2018,9:55 AM

## 2018-12-28 NOTE — Progress Notes (Signed)
Nutrition Follow-up  DOCUMENTATION CODES:   Obesity unspecified  INTERVENTION:   -Continue MVI with minerals daily -Continue Ensure Enlive po BID, each supplement provides 350 kcal and 20 grams of protein -30 ml Prostat BID, each supplement provides 100 kcals and 15 grams protein -Magic cup TID with meals, each supplement provides 290 kcal and 9 grams of protein -Last BM documented 12/22/18; consider initiation of aggressive bowel regimen  NUTRITION DIAGNOSIS:   Inadequate oral intake related to lethargy/confusion as evidenced by estimated needs(reported meal completion).  Ongoing  GOAL:   Patient will meet greater than or equal to 90% of their needs  Unmet  MONITOR:   PO intake, Supplement acceptance, Skin, Weight trends, Labs, I & O's  REASON FOR ASSESSMENT:   Malnutrition Screening Tool    ASSESSMENT:   58 year old man with hyperlipidemia, hypertension, and cocaine use disorder admitted with right upper and lower extremity weakness admitted with scattered acute infarcts in the medial left ACA and MCA watershed distribution.  Reviewed I/O's: -880 ml x 24 hours and -3 L since admission  UOP: 1 L x 24 hours  Pt sitting up in recliner chair, receiving nursing care at time of visit.  Pt with variable oral intake; noted meal completion 5-100% (averaging around 25%). Pt has refused the last two doses of Ensure per MAR.   Last documented BM on 12/22/18; miralax added on 12/23/18.   Medications reviewed and include MVI, folvite, and vitamin B-1.   Pt is a difficult placement; recommendation for SNF vs possible CIR.   Labs reviewed.   Diet Order:   Diet Order            Diet regular Room service appropriate? Yes; Fluid consistency: Thin  Diet effective now              EDUCATION NEEDS:   Not appropriate for education at this time  Skin:  Skin Assessment: Reviewed RN Assessment  Last BM:  12/22/18  Height:   Ht Readings from Last 1 Encounters:  12/19/18  5\' 9"  (1.753 m)    Weight:   Wt Readings from Last 1 Encounters:  12/19/18 102.1 kg    Ideal Body Weight:  72.7 kg  BMI:  Body mass index is 33.23 kg/m.  Estimated Nutritional Needs:   Kcal:  2000-2200  Protein:  100-115 grams  Fluid:  >/= 2 L/day    Thersa Mohiuddin A. Jimmye Norman, RD, LDN, [place] Registered Dietitian II Certified Diabetes Care and Education Specialist Pager: [phone] After hours Pager: [phone]

## 2018-12-28 NOTE — Progress Notes (Signed)
Family Update:  Patient brother Philip Vasquez at bedside spoke with SW about POC/placement. Primary RN spoke with brother Philip Vasquez via phone concerning POC/status, made several attempts to return patients mothers call but the phone was busy each time. Brother was aware and will update mother with information.

## 2018-12-28 NOTE — Progress Notes (Signed)
  Speech Language Pathology Treatment: Cognitive-Linquistic  Patient Details Name: Philip Vasquez MRN: [id number] DOB: 27-Nov-1960 Today's Date: 12/28/2018 Time: 5284-1324 SLP Time Calculation (min) (ACUTE ONLY): 20 min  Assessment / Plan / Recommendation Clinical Impression  Pt was seen for treatment and was cooperative throughout the session. He was oriented x4 during the session. Perseveration was noted during the session and he continues to demonstrate reduced awareness of his deficits. He frequently repeated, "I want to go home", "I want to go home now", "send me home", "send me home now" or some variation thereof and required mod-max cues to problem solve through what could potentially occur if he were to go home at this time without support. He achieved 20% accuracy with responsive naming increasing to 80% with mod cues. He completed simple reasoning tasks with 80% accuracy increasing to 100% with min cues. He responded to complex yes/no questions with 40% accuracy. SLP will continue to follow pt.    HPI HPI: 58 year old gentleman with no past medical history who presented with AMS and right-sided weakness. MRI: Widely scattered small acute infarcts in the medial lefthemisphere involving left ACA and left ACA/MCA watershed area. UA positive for cocaine use.       SLP Plan  Continue with current plan of care       Recommendations                   Follow up Recommendations: 24 hour supervision/assistance;Inpatient Rehab SLP Visit Diagnosis: Aphasia (R47.01) Plan: Continue with current plan of care       Constantino Starace I. Hardin Negus, [place], [place] Office number [phone] Pager [place] 12/28/2018, 5:28 PM

## 2018-12-29 LAB — BASIC METABOLIC PANEL
Anion gap: 9 (ref 5–15)
BUN: 44 mg/dL — ABNORMAL HIGH (ref 6–20)
CO2: 22 mmol/L (ref 22–32)
Calcium: 9.4 mg/dL (ref 8.9–10.3)
Chloride: 108 mmol/L (ref 98–111)
Creatinine, Ser: 2 mg/dL — ABNORMAL HIGH (ref 0.61–1.24)
GFR calc Af Amer: 41 mL/min — ABNORMAL LOW (ref >60)
GFR calc non Af Amer: 36 mL/min — ABNORMAL LOW (ref >60)
Glucose, Bld: 115 mg/dL — ABNORMAL HIGH (ref 70–99)
Potassium: 4.3 mmol/L (ref 3.5–5.1)
Sodium: 139 mmol/L (ref 135–145)

## 2018-12-29 MED ORDER — SODIUM CHLORIDE 0.9 % IV SOLN
INTRAVENOUS | Status: DC
  Administered 2018-12-29 – 2019-01-23 (×18): via INTRAVENOUS

## 2018-12-29 NOTE — [place] FL2 (Signed)
[place] MEDICAID FL2 LEVEL OF CARE SCREENING TOOL     IDENTIFICATION  Patient Name: Philip Vasquez Birthdate: Feb 28, 1961 Sex: male Admission Date (Current Location): 12/19/2018  [facility] and [place] Number:  Producer, television/film/video and Address:  [facility], [address], [place], [place] [address]      Provider Number: [id number]  Attending Physician Name and Address:  Earl Lagos, MD  Relative Name and Phone Number:       Current Level of Care: Hospital Recommended Level of Care: Skilled Nursing Facility Prior Approval Number:    Date Approved/Denied:   PASRR Number: [id number]  Discharge Plan: SNF    Current Diagnoses: Patient Active Problem List   Diagnosis Date Noted  . Cocaine use disorder (HCC) 12/21/2018  . Essential hypertension 12/21/2018  . Hyperlipidemia 12/21/2018  . Cerebral thrombosis with cerebral infarction 12/20/2018    Orientation RESPIRATION BLADDER Height & Weight     Self, Time, Situation, Place  Normal Incontinent Weight: 225 lb (102.1 kg) Height:  5\' 9"  (175.3 cm)  BEHAVIORAL SYMPTOMS/MOOD NEUROLOGICAL BOWEL NUTRITION STATUS      Incontinent Diet(see DC summary)  AMBULATORY STATUS COMMUNICATION OF NEEDS Skin   Extensive Assist Verbally Normal                       Personal Care Assistance Level of Assistance  Bathing, Feeding, Dressing Bathing Assistance: Maximum assistance Feeding assistance: Maximum assistance Dressing Assistance: Maximum assistance     Functional Limitations Info  Sight, Hearing, Speech Sight Info: Adequate Hearing Info: Adequate Speech Info: Impaired(dysarthria)    SPECIAL CARE FACTORS FREQUENCY  PT (By licensed PT), OT (By licensed OT), Speech therapy     PT Frequency: 5x/wk OT Frequency: 5x/wk     Speech Therapy Frequency: 5x/wk      Contractures Contractures Info: Not present    Additional Factors Info  Code Status, Allergies Code Status Info: Full Allergies  Info: NKA           Current Medications (12/29/2018):  This is the current hospital active medication list Current Facility-Administered Medications  Medication Dose Route Frequency Provider Last Rate Last Dose  . 0.9 %  sodium chloride infusion   Intravenous Continuous 12/31/2018, MD 50 mL/hr at 12/29/18 [phone]    . acetaminophen (TYLENOL) tablet 650 mg  650 mg Oral Q6H PRN 5621, MD   650 mg at 12/23/18 2104   Or  . acetaminophen (TYLENOL) suppository 650 mg  650 mg Rectal Q6H PRN 2105, MD      . amLODipine (NORVASC) tablet 10 mg  10 mg Oral Daily Lanelle Bal, MD   10 mg at 12/28/18 1740  . aspirin EC tablet 81 mg  81 mg Oral Daily 12/30/18, MD   81 mg at 12/29/18 1019  . baclofen (LIORESAL) tablet 5 mg  5 mg Oral TID 12/31/18, MD   5 mg at 12/29/18 1019  . clopidogrel (PLAVIX) tablet 75 mg  75 mg Oral Daily 12/31/18, MD   75 mg at 12/29/18 1019  . enoxaparin (LOVENOX) injection 40 mg  40 mg Subcutaneous Q24H 12/31/18, MD   40 mg at 12/29/18 1019  . feeding supplement (ENSURE ENLIVE) (ENSURE ENLIVE) liquid 237 mL  237 mL Oral BID BM 12/31/18, MD   237 mL at 12/29/18 1020  . folic acid (FOLVITE) tablet 1 mg  1 mg Oral Daily 12/31/18, MD   1 mg  at 12/29/18 1019  . hydrALAZINE (APRESOLINE) tablet 50 mg  50 mg Oral TID Kathi Ludwig, MD   50 mg at 12/29/18 1018  . labetalol (NORMODYNE) tablet 100 mg  100 mg Oral TID PRN [name], MD   100 mg at 12/22/18 1414  . MEDLINE mouth rinse  15 mL Mouth Rinse BID Aldine Contes, MD   15 mL at 12/29/18 1138  . metoprolol tartrate (LOPRESSOR) tablet 25 mg  25 mg Oral BID [name], MD   25 mg at 12/29/18 1018  . multivitamin with minerals tablet 1 tablet  1 tablet Oral Daily Madalyn Rob, MD   1 tablet at 12/29/18 1019  . polyethylene glycol (MIRALAX / GLYCOLAX) packet 17 g  17 g Oral Daily Kathi Ludwig, MD   17 g at 12/28/18 1008   . rosuvastatin (CRESTOR) tablet 40 mg  40 mg Oral q1800 [name], MD   40 mg at 12/28/18 1741  . thiamine (VITAMIN B-1) tablet 100 mg  100 mg Oral Daily Madalyn Rob, MD   100 mg at 12/29/18 1018   Or  . thiamine (B-1) injection 100 mg  100 mg Intravenous Daily Madalyn Rob, MD   100 mg at 12/21/18 1052     Discharge Medications: Please see discharge summary for a list of discharge medications.  Relevant Imaging Results:  Relevant Lab Results:   Additional Information SS#: [id number]  Geralynn Ochs, LCSW

## 2018-12-29 NOTE — Progress Notes (Signed)
Inpatient Rehabilitation Admissions Coordinator  Inpatient rehab consult received. I met with patient at bedside. He was non verbal. I contacted his brother, Legrand Como, by phone to assess caregiver support which will be needed after any rehab stay. Brother reports patient is homeless. He just happened to visit his Mom the day of admission. Mom is unable to provide any assistance nor is brother. I am recommending SNF at this time. Brother is aware and in agreement. I have updated RN CM. Kelli. We will sign off at this time.  Danne Baxter, RN, MSN Rehab Admissions Coordinator [phone] 12/29/2018 10:51 AM

## 2018-12-29 NOTE — Progress Notes (Signed)
   Subjective: Pt seen at the bedside on rounds this AM on rounds. Patient was sleeping upon entering the room. He was sleepy and intermittently woke up when we called his name. Asked if anything was bothering him and patient did not respond.  Objective:  Vital signs in last 24 hours: Vitals:   12/28/18 2325 12/29/18 0352 12/29/18 0857 12/29/18 1305  BP: (!) 148/96 (!) 141/88 (!) 155/98 (!) 138/104  Pulse: 80 77 81 66  Resp: 18 17 20 18   Temp: 98.4 F (36.9 C) 98.2 F (36.8 C) 98.3 F (36.8 C) 98.4 F (36.9 C)  TempSrc: Oral Oral Oral Oral  SpO2: 95% 97% 99% 100%  Weight:      Height:       Physical Exam: General: Laying in bed on right side, NAD HEENT: NCAT CV: RRR, normal S1-S2, no murmurs rubs or gallops appreciated, no edema present in lower extremities PULM: Clear to auscultation bilaterally ABD: Nontender NEURO: Somnolent, does not respond to questions but will intermittently wake up to the sound of his name. Diffuse R-sided weakness persists  Assessment/Plan:  Principal Problem:   Cerebral thrombosis with cerebral infarction Active Problems:   Cocaine use disorder (HCC)   Essential hypertension   Hyperlipidemia  In summary Philip Vasquez is a 58 year old gentleman who uses cocaine daily with no recorded past medical history who presents after experiencing right sided weakness, speech abnormalities and somnolence, and MRI head findings that showed widely scattered small acute infarcts in the medial left hemisphere involving left ACA and left ACA/MCA watershed area and chronic small vessel disease. His current deficits are right-sided weakness and dysarthria.  The patient is currently medically stable and has been optimized for discharge.  However, his disposition is unknown because he is not a candidate for CIR or SNF placement due to lack of insurance.  He is homeless so home health is not an option.  #Dysarthria #R sided weakness #L ACA/MCA  Infarcts:  MRI of the head  showed widely scattered small acute infarcts in the medial left hemisphere involving left ACA and left ACA/MCA watershed area in addition to chronic small vessel disease. Neurology was consulted and is signed off. - Continue rosuvastatin 40 mg daily  - Continue aspirin 81 mg daily and Plavix 75 mg daily for 3 weeks (stop date October 5) followed by aspirin 81 mg alone - Hemoglobin A1c 6.2%, elevated but no need for metformin at this time. - PT/OT/SLP consulted. Recommend CIR or SNF - Follow-up with neurology in 6 weeks in the outpatient setting  #HTN: Out of the permissive hypertension window. BP has been significantly elevated at 136/101 this AM.  - Continue metoprolol 25 mg BID - Continue amlodipine 10 mg daily - Continue Hydrlazine 50 mg TID - Continue labetalol 100 mg TID PRN  #AKI #CKD?: Cr on admission 1.90, GFR 40. No baseline available.  Creatinine is increased to 2.0 from 1.75 and BUN is 44.  This is in the context of low p.o. fluid intake.  - Every other day BMP - start NS 50 cc/hr   #FEN/GI - Diet: Regular with thin liquids, Ensure feeding supplement - IVF: NS 50 cc/hr   #DVT prophylaxis - Lovenox 40 mg subq injections  #Dispo: Unknown.  Patient not a candidate for CIR or SNF due to uninsured status. Working with [facility].  [name], MD Internal Medicine, PGY1 Pager: [phone]  12/29/2018,1:17 PM

## 2018-12-29 NOTE — Progress Notes (Signed)
  Speech Language Pathology Treatment: Cognitive-Linquistic;Dysphagia  Patient Details Name: Philip Vasquez MRN: [id number] DOB: Jan 27, 1961 Today's Date: 12/29/2018 Time: 7169-6789 SLP Time Calculation (min) (ACUTE ONLY): 16 min  Assessment / Plan / Recommendation Clinical Impression  Pt seen for am meal, repositioned, distractions eliminated in left visual field. Pt did not demonstrate any signs of aspriation or difficulty masticating, no oral residue, but he was extrememly impulsive. Eating behaviors at times dangerous (almost poking himself in the eye with a fork, trying to eat a banana by gnawing on the stem, devoring bacon and 8 oz of liquid in seconds). Pt needed tactile tues for safety, also verbal cues to locate items and use them (utensils, napkin, drink). He was able to name 5/5 items on tray in mid-left visual field, directed gaze right but ignored items despite cueing. Overall, pt does not need f/u for swallowing as cognitive linguistic needs are the primary reason for impaired safety with meals. Will continue to follow to address these goals.    HPI HPI: 58 year old gentleman with no past medical history who presented with AMS and right-sided weakness. MRI: Widely scattered small acute infarcts in the medial lefthemisphere involving left ACA and left ACA/MCA watershed area. UA positive for cocaine use.       SLP Plan   F/u for cognitive linguistic interventions 2x a week for 2 weeks.        Recommendations  Diet recommendations: Regular;Thin liquid                Follow up Recommendations: 24 hour supervision/assistance;Inpatient Rehab SLP Visit Diagnosis: Aphasia (R47.01)       GO               [name], MA CCC-SLP  Acute Rehabilitation Services Pager [phone] Office [phone]  [name] 12/29/2018, 10:20 AM

## 2018-12-30 LAB — BASIC METABOLIC PANEL
Anion gap: 10 (ref 5–15)
BUN: 44 mg/dL — ABNORMAL HIGH (ref 6–20)
CO2: 20 mmol/L — ABNORMAL LOW (ref 22–32)
Calcium: 9.2 mg/dL (ref 8.9–10.3)
Chloride: 112 mmol/L — ABNORMAL HIGH (ref 98–111)
Creatinine, Ser: 1.85 mg/dL — ABNORMAL HIGH (ref 0.61–1.24)
GFR calc Af Amer: 46 mL/min — ABNORMAL LOW (ref >60)
GFR calc non Af Amer: 39 mL/min — ABNORMAL LOW (ref >60)
Glucose, Bld: 113 mg/dL — ABNORMAL HIGH (ref 70–99)
Potassium: 4.6 mmol/L (ref 3.5–5.1)
Sodium: 142 mmol/L (ref 135–145)

## 2018-12-30 NOTE — Progress Notes (Addendum)
Occupational Therapy Treatment Patient Details Name: Philip Vasquez MRN: [id number] DOB: 1960/05/24 Today's Date: 12/30/2018    History of present illness Philip Vasquez is a 58 year old gentleman with no past medical history who presented with AMS and right-sided weakness. MRI: Widely scattered small acute infarcts in the medial lefthemisphere involving left ACA and left ACA/MCA watershed area.   OT comments  Pt did not verbally communicate with therapists during session today but said "hello" and tried to say "how are you" with prompts on telephone when mother called. Pt difficult to motivate to perform ADL, pt putting washcloth down at waist x2 times even with hand over hand technique near face- pt requiring maximal verbal and tactile cues to perform task. Pt tolerating [place] Dec lift for sit to stand transfers from EOB to recliner; second sit to stand from recliner at sink; max A +2 to power up into standing from sink. Pt tolerating standing <1 minute. R side continues to be 1+ to 2-/5 MM grade in RUE. Pt continues to progress. OT following acutely.    Follow Up Recommendations  SNF;Supervision/Assistance - 24 hour    Equipment Recommendations  Wheelchair (measurements OT);Wheelchair cushion (measurements OT)    Recommendations for Other Services      Precautions / Restrictions Precautions Precautions: Fall Restrictions Weight Bearing Restrictions: No       Mobility Bed Mobility Overal bed mobility: Needs Assistance Bed Mobility: Supine to Sit     Supine to sit: Mod assist;+2 for safety/equipment;HOB elevated     General bed mobility comments: assist to bring R LE to EOB, scoot L hip to EOB, and then steady when elevating trunk into sitting   Transfers Overall transfer level: Needs assistance   Transfers: Sit to/from Stand Sit to Stand: Max assist;+2 physical assistance;+2 safety/equipment         General transfer comment: sara lift utilized for sit to stand transfers from EOB  to recliner; second sit to stand from recliner at sink; max A +2 to power up into standing and to maintain standing balance given heavy R lateral lean    Balance Overall balance assessment: Needs assistance Sitting-balance support: Single extremity supported;Bilateral upper extremity supported;Feet supported Sitting balance-Leahy Scale: Poor Sitting balance - Comments: assist for positioning of R UE and approximation through R UE in sitting EOB  Postural control: Right lateral lean Standing balance support: Bilateral upper extremity supported;During functional activity Standing balance-Leahy Scale: Zero Standing balance comment: assistance required for weight shifting to L side; heavy R lateral bias                            ADL either performed or assessed with clinical judgement   ADL Overall ADL's : Needs assistance/impaired Eating/Feeding: Supervision/ safety;Minimal assistance Eating/Feeding Details (indicate cue type and reason): Using LUE Grooming: Maximal assistance;Bed level Grooming Details (indicate cue type and reason): Unable to really follow commands or motivate pt. Upper Body Bathing: Maximal assistance   Lower Body Bathing: Total assistance   Upper Body Dressing : Total assistance   Lower Body Dressing: Total assistance   Toilet Transfer: Maximal assistance;+2 for physical assistance;+2 for safety/equipment;Stand-pivot Toilet Transfer Details (indicate cue type and reason): sara lift +2 for transfers Toileting- Clothing Manipulation and Hygiene: Total assistance       Functional mobility during ADLs: Maximal assistance;+2 for physical assistance;+2 for safety/equipment;Cueing for safety General ADL Comments: Pt difficult to motivate to perform ADL, pt putting washcloth down at waist x2 times even  with hand over hand technique- pt reuquiring maximal verbal and tactile cues to perform task.     Vision   Vision Assessment?: Vision impaired- to be  further tested in functional context Additional Comments: Pt localizing sounds and turning head more- pt does not appear to have a gaze preference.   Perception     Praxis      Cognition Arousal/Alertness: Awake/alert Behavior During Therapy: Flat affect;Impulsive Overall Cognitive Status: Impaired/Different from baseline Area of Impairment: Attention;Following commands;Safety/judgement;Problem solving                   Current Attention Level: Sustained   Following Commands: Follows one step commands inconsistently Safety/Judgement: Decreased awareness of safety;Decreased awareness of deficits   Problem Solving: Difficulty sequencing;Requires verbal cues;Requires tactile cues;Slow processing General Comments: pt did not verbally communicate with therapists during session today but said "hello" and tried to say "how are you" with prompts on telephone when mother called        Exercises     Shoulder Instructions       General Comments      Pertinent Vitals/ Pain       Pain Assessment: Faces Faces Pain Scale: No hurt  Home Living                                          Prior Functioning/Environment              Frequency  Min 2X/week        Progress Toward Goals  OT Goals(current goals can now be found in the care plan section)  Progress towards OT goals: Progressing toward goals  Acute Rehab OT Goals Patient Stated Goal: pt unable to state due to expressive difficulties and lethargy OT Goal Formulation: Patient unable to participate in goal setting Time For Goal Achievement: 01/13/19 Potential to Achieve Goals: Fair ADL Goals Pt Will Perform Grooming: with min assist;sitting Pt Will Perform Upper Body Bathing: with min assist;sitting Pt Will Transfer to Toilet: with min assist;with +2 assist;stand pivot transfer;bedside commode Pt/caregiver will Perform Home Exercise Program: Increased ROM;Right Upper extremity;With written  HEP provided Additional ADL Goal #1: Pt will be able to roll left and right with min A to help with basic ADLs Additional ADL Goal #2: Pt will be able to come up to sit EOB with min A in prep for transfers and EOB ADLs Additional ADL Goal #3: Pt will be able to locate items asked if him without cues  Plan Discharge plan remains appropriate    Co-evaluation    PT/OT/SLP Co-Evaluation/Treatment: Yes Reason for Co-Treatment: Complexity of the patient's impairments (multi-system involvement);For patient/therapist safety PT goals addressed during session: Mobility/safety with mobility;Balance OT goals addressed during session: ADL's and self-care      AM-PAC OT "6 Clicks" Daily Activity     Outcome Measure   Help from another person eating meals?: A Lot Help from another person taking care of personal grooming?: A Lot Help from another person toileting, which includes using toliet, bedpan, or urinal?: Total Help from another person bathing (including washing, rinsing, drying)?: Total Help from another person to put on and taking off regular upper body clothing?: A Lot Help from another person to put on and taking off regular lower body clothing?: Total 6 Click Score: 9    End of Session Equipment Utilized During Treatment: Gait belt;Rolling walker  OT Visit Diagnosis: Other abnormalities of gait and mobility (R26.89);Muscle weakness (generalized) (M62.81);Low vision, both eyes (H54.2);Other symptoms and signs involving cognitive function;Cognitive communication deficit (R41.841);Hemiplegia and hemiparesis Symptoms and signs involving cognitive functions: Cerebral infarction Hemiplegia - Right/Left: Right Hemiplegia - dominant/non-dominant: Dominant Hemiplegia - caused by: Cerebral infarction   Activity Tolerance Patient limited by fatigue;Treatment limited secondary to medical complications (Comment)   Patient Left in chair;with call bell/phone within reach;with chair alarm set    Nurse Communication Mobility status        Time: 7829-5621 OT Time Calculation (min): 40 min  Charges: OT General Charges $OT Visit: 1 Visit OT Treatments $Self Care/Home Management : 8-22 mins  Cristi Loron) Glendell Docker OTR/L Acute Rehabilitation Services Pager: [phone] Office: [phone]    Gunnar Fusi Leah Thornberry 12/30/2018, 4:06 PM

## 2018-12-30 NOTE — Progress Notes (Signed)
  Speech Language Pathology Treatment: Cognitive-Linquistic  Patient Details Name: Philip Vasquez MRN: [id number] DOB: 1960/06/25 Today's Date: 12/30/2018 Time: 0947-0962 SLP Time Calculation (min) (ACUTE ONLY): 35 min  Assessment / Plan / Recommendation Clinical Impression  Great session with pts brother present for education. Pt up in chair, He was able to name 5 foods on his tray with min verbal cues. Consistently followed commands. Named his two brothers with moderate verbal cues. Language is impaired but cognition is the primary concern as pt is impulsive, perseverative and also has difficulty initiating language and tasks. 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 For example "do you want water?" - "No" but then he takes the water and cant stop drinking it. He was able to correct several unsafe behaviors with moderate verbal and visual cues today. He also ordered dinner and lunch from the menu with choice cues x6. Provided instruction to brother on how to cue him and how to encourage language and attention. Will continue efforts.    HPI HPI: 58 year old gentleman with no past medical history who presented with AMS and right-sided weakness. MRI: Widely scattered small acute infarcts in the medial lefthemisphere involving left ACA and left ACA/MCA watershed area. UA positive for cocaine use.       SLP Plan  Continue with current plan of care       Recommendations                   Follow up Recommendations: Inpatient Rehab SLP Visit Diagnosis: Aphasia (R47.01) Plan: Continue with current plan of care       GO              [name], MA [place] Pager [phone] Office [phone]   [name] 12/30/2018, 3:17 PM

## 2018-12-30 NOTE — Progress Notes (Signed)
Physical Therapy Treatment Patient Details Name: Philip Vasquez MRN: [id number] DOB: January 02, 1961 Today's Date: 12/30/2018    History of Present Illness Philip Vasquez is a 58 year old gentleman with no past medical history who presented with AMS and right-sided weakness. MRI: Widely scattered small acute infarcts in the medial lefthemisphere involving left ACA and left ACA/MCA watershed area.    PT Comments    Patient seen for mobility progression. Clarise Cruz Lift utilized for sit to stand transfer and transfer EOB to recliner. Pt then stood again at sink from recliner with max A +2. Pt continues to present with R side weakness and R lateral bias in sitting/standing. Continue to progress as tolerated.     Follow Up Recommendations  CIR;Supervision/Assistance - 24 hour     Equipment Recommendations  Other (comment)(TBD)    Recommendations for Other Services       Precautions / Restrictions Precautions Precautions: Fall Restrictions Weight Bearing Restrictions: No    Mobility  Bed Mobility Overal bed mobility: Needs Assistance Bed Mobility: Supine to Sit     Supine to sit: Mod assist;+2 for safety/equipment;HOB elevated     General bed mobility comments: assist to bring R LE to EOB, scoot L hip to EOB, and then steady when elevating trunk into sitting   Transfers Overall transfer level: Needs assistance   Transfers: Sit to/from Stand Sit to Stand: Max assist;+2 physical assistance;+2 safety/equipment         General transfer comment: sara lift utilized for sit to stand transfers from EOB to recliner; second sit to stand from recliner at sink; max A +2 to power up into standing and to maintain standing balance given heavy R lateral lean  Ambulation/Gait             General Gait Details: unable at this time   Stairs             Wheelchair Mobility    Modified Rankin (Stroke Patients Only) Modified Rankin (Stroke Patients Only) Modified Rankin: Severe  disability     Balance Overall balance assessment: Needs assistance Sitting-balance support: Single extremity supported;Bilateral upper extremity supported;Feet supported Sitting balance-Leahy Scale: Poor Sitting balance - Comments: assist for positioning of R UE and approximation through R UE in sitting EOB  Postural control: Right lateral lean Standing balance support: Bilateral upper extremity supported;During functional activity Standing balance-Leahy Scale: Zero Standing balance comment: assistance required for weight shifting to L side; heavy R lateral bias                             Cognition Arousal/Alertness: Awake/alert Behavior During Therapy: Flat affect;Impulsive Overall Cognitive Status: Impaired/Different from baseline Area of Impairment: Attention;Following commands;Safety/judgement;Problem solving                   Current Attention Level: Sustained   Following Commands: Follows one step commands inconsistently Safety/Judgement: Decreased awareness of safety;Decreased awareness of deficits   Problem Solving: Difficulty sequencing;Requires verbal cues;Requires tactile cues;Slow processing General Comments: pt did not verbally communicate with therapists during session today but answered telephone when mother called      Exercises      General Comments        Pertinent Vitals/Pain Pain Assessment: Faces Faces Pain Scale: No hurt    Home Living                      Prior Function  PT Goals (current goals can now be found in the care plan section) Progress towards PT goals: Progressing toward goals    Frequency    Min 4X/week      PT Plan Current plan remains appropriate    Co-evaluation PT/OT/SLP Co-Evaluation/Treatment: Yes Reason for Co-Treatment: For patient/therapist safety;To address functional/ADL transfers;Necessary to address cognition/behavior during functional activity PT goals addressed  during session: Mobility/safety with mobility;Balance        AM-PAC PT "6 Clicks" Mobility   Outcome Measure  Help needed turning from your back to your side while in a flat bed without using bedrails?: A Lot Help needed moving from lying on your back to sitting on the side of a flat bed without using bedrails?: A Lot Help needed moving to and from a bed to a chair (including a wheelchair)?: Total Help needed standing up from a chair using your arms (e.g., wheelchair or bedside chair)?: A Lot Help needed to walk in hospital room?: Total Help needed climbing 3-5 steps with a railing? : Total 6 Click Score: 9    End of Session Equipment Utilized During Treatment: Gait belt Activity Tolerance: Patient tolerated treatment well Patient left: with call bell/phone within reach;in chair;with chair alarm set Nurse Communication: Mobility status;Need for lift equipment PT Visit Diagnosis: Other abnormalities of gait and mobility (R26.89);Hemiplegia and hemiparesis;Muscle weakness (generalized) (M62.81) Hemiplegia - Right/Left: Right     Time: 2119-4174 PT Time Calculation (min) (ACUTE ONLY): 41 min  Charges:  $Gait Training: 23-37 mins                     Erline Levine, PTA Acute Rehabilitation Services Pager: [phone] Office: [phone]     Carolynne Edouard 12/30/2018, 3:44 PM

## 2018-12-30 NOTE — Progress Notes (Signed)
   Subjective: Pt seen at the bedside on rounds this AM on rounds.  The patient was sleeping and is difficult to arouse but does follow commands consistently.  Does not respond to questions.  Does not appear to be in pain or discomfort at this time.  Objective:   Vital signs in last 24 hours: Vitals:   12/29/18 2146 12/29/18 2356 12/30/18 0402 12/30/18 0748  BP: (!) 137/101 137/75 (!) 154/86 (!) 155/91  Pulse: 74 66 74 79  Resp:  19 18 20   Temp:  98.3 F (36.8 C) 99.1 F (37.3 C) 97.9 F (36.6 C)  TempSrc:   Oral Oral  SpO2:  90% 97% 95%  Weight:      Height:       Physical Exam: General: Resting in bed, NAD HEENT: NCAT CV: RRR, normal S1-S2, no murmurs rubs or gallops appreciated, no edema present in lower extremities PULM: Clear to auscultation bilaterally, no wheezes or crackles appreciated ABD: Nontender in all quadrants, bowel sounds present NEURO: Alert but sleepy, follows commands consistently.  Diffuse R-sided weakness persists  Assessment/Plan:  Principal Problem:   Cerebral thrombosis with cerebral infarction Active Problems:   Cocaine use disorder (HCC)   Essential hypertension   Hyperlipidemia  In summary Mr. Cerro is a 58 year old gentleman who uses cocaine daily with no recorded past medical history who presents after experiencing right sided weakness, speech abnormalities and somnolence, and MRI head findings that showed widely scattered small acute infarcts in the medial left hemisphere involving left ACA and left ACA/MCA watershed area and chronic small vessel disease. His current deficits are right-sided weakness and dysarthria. The patient is currently medically stable and has been optimized for discharge.  However, his disposition is unknown because he is not a candidate for CIR or SNF placement due to lack of insurance.  He is homeless so home health is not an option.  #Dysarthria #R sided weakness #L ACA/MCA  Infarcts:  MRI of the head showed widely  scattered small acute infarcts in the medial left hemisphere involving left ACA and left ACA/MCA watershed area in addition to chronic small vessel disease. Neurology was consulted and is signed off. - Continue rosuvastatin 40 mg daily  - Continue aspirin 81 mg daily and Plavix 75 mg daily for 3 weeks (stop date October 5) followed by aspirin 81 mg alone - Hemoglobin A1c 6.2%, elevated but no need for metformin at this time. - PT/OT/SLP consulted. Recommend CIR or SNF - Follow-up with neurology in 6 weeks in the outpatient setting  #HTN: Out of the permissive hypertension window. BP has been significantly elevated at 136/101 this AM.  - Continue metoprolol 25 mg BID - Continue amlodipine 10 mg daily - Continue Hydrlazine 50 mg TID - Continue labetalol 100 mg TID PRN  #AKI #CKD?: Cr on admission 1.90, GFR 40. No baseline available.  Creatinine decreased to 1.85 from 2.0 yesterday.   - Every other day BMP -Continue NS 50 cc/hr  #FEN/GI - Diet: Regular with thin liquids, Ensure feeding supplement - IVF: NS 50 cc/hr   #DVT prophylaxis - Lovenox 40 mg subq injections  #Dispo: Unknown.  Patient not a candidate for CIR or SNF due to uninsured status. Working with [place].  [name], MD Internal Medicine, PGY1 Pager: [phone]  12/30/2018,11:52 AM

## 2018-12-30 NOTE — Plan of Care (Signed)
  Problem: Education: Goal: Knowledge of General Education information will improve Description Including pain rating scale, medication(s)/side effects and non-pharmacologic comfort measures Outcome: Progressing   Problem: Health Behavior/Discharge Planning: Goal: Ability to manage health-related needs will improve Outcome: Progressing   Problem: Clinical Measurements: Goal: Ability to maintain clinical measurements within normal limits will improve Outcome: Progressing Goal: Will remain free from infection Outcome: Progressing Goal: Diagnostic test results will improve Outcome: Progressing Goal: Respiratory complications will improve Outcome: Progressing Goal: Cardiovascular complication will be avoided Outcome: Progressing   Problem: Activity: Goal: Risk for activity intolerance will decrease Outcome: Progressing   Problem: Nutrition: Goal: Adequate nutrition will be maintained Outcome: Progressing   Problem: Coping: Goal: Level of anxiety will decrease Outcome: Progressing   Problem: Elimination: Goal: Will not experience complications related to bowel motility Outcome: Progressing Goal: Will not experience complications related to urinary retention Outcome: Progressing   Problem: Pain Managment: Goal: General experience of comfort will improve Outcome: Progressing   Problem: Safety: Goal: Ability to remain free from injury will improve Outcome: Progressing   Problem: Skin Integrity: Goal: Risk for impaired skin integrity will decrease Outcome: Progressing   Problem: Education: Goal: Knowledge of disease or condition will improve Outcome: Progressing Goal: Knowledge of secondary prevention will improve Outcome: Progressing Goal: Knowledge of patient specific risk factors addressed and post discharge goals established will improve Outcome: Progressing Goal: Individualized Educational Video(s) Outcome: Progressing   Problem: Coping: Goal: Will verbalize  positive feelings about self Outcome: Progressing Goal: Will identify appropriate support needs Outcome: Progressing   Problem: Health Behavior/Discharge Planning: Goal: Ability to manage health-related needs will improve Outcome: Progressing   Problem: Self-Care: Goal: Ability to participate in self-care as condition permits will improve Outcome: Progressing Goal: Verbalization of feelings and concerns over difficulty with self-care will improve Outcome: Progressing Goal: Ability to communicate needs accurately will improve Outcome: Progressing   Problem: Nutrition: Goal: Risk of aspiration will decrease Outcome: Progressing Goal: Dietary intake will improve Outcome: Progressing   Problem: Ischemic Stroke/TIA Tissue Perfusion: Goal: Complications of ischemic stroke/TIA will be minimized Outcome: Progressing  Lovette Merta, BSN, RN 

## 2018-12-31 NOTE — Progress Notes (Signed)
   Subjective: Patient was seen at the bedside on morning rounds.  Has no complaints at this time.   Objective:  Vital signs in last 24 hours: Vitals:   12/31/18 0017 12/31/18 0442 12/31/18 0758 12/31/18 1143  BP: 140/89 (!) 154/93 (!) 156/100 (!) 131/98  Pulse: 70 70 67 67  Resp: 18 19 15 20   Temp: 98.6 F (37 C) 98.3 F (36.8 C) 98.4 F (36.9 C) 98.3 F (36.8 C)  TempSrc:   Oral Oral  SpO2: 97% 100% 98% 99%  Weight:      Height:       Physical Exam: General: Laying in bed watching TV, NAD HEENT: NCAT CV: RRR, normal S1-S2, no murmurs rubs or gallops appreciated, no edema present in lower extremities PULM: Clear to auscultation bilaterally ABD: Nontender, normal bowel sounds  NEURO: Alert, diffuse R-sided weakness  Assessment/Plan:  Principal Problem:   Cerebral thrombosis with cerebral infarction Active Problems:   Cocaine use disorder (HCC)   Essential hypertension   Hyperlipidemia  In summary Philip Vasquez is a 58 year old gentleman who uses cocaine daily with no recorded past medical history who presents after experiencing right sided weakness, speech abnormalities and somnolence, and MRI head findings that showed widely scattered small acute infarcts in the medial left hemisphere involving left ACA and left ACA/MCA watershed area and chronic small vessel disease. His current deficits are right-sided weakness and dysarthria.  The patient is currently medically stable and has been optimized for discharge.  However, his disposition is unknown because he is not a candidate for CIR or SNF placement due to lack of insurance.  He is homeless so home health is not an option.  #Dysarthria #R sided weakness #L ACA/MCA  Infarcts:  MRI of the head showed widely scattered small acute infarcts in the medial left hemisphere involving left ACA and left ACA/MCA watershed area in addition to chronic small vessel disease. Neurology was consulted and has signed off. - Continue rosuvastatin  40 mg daily  - Continue aspirin 81 mg daily and Plavix 75 mg daily for 3 weeks (stop date October 5) followed by aspirin 81 mg alone - Hemoglobin A1c 6.2%, elevated but no need for metformin at this time. - PT/OT/SLP consulted. Recommend CIR or SNF - Follow-up with neurology in 6 weeks in the outpatient setting  #HTN: Out of the permissive hypertension window. BP has been significantly elevated at 154/93 this AM.  - Continue metoprolol 25 mg BID - Continue amlodipine 10 mg daily - Continue Hydrlazine 50 mg TID - Continue labetalol 100 mg TID PRN  #AKI #CKD?: Cr on admission 1.90, GFR 40. No baseline available. Creatinine 1.85 yesterday.   - Every other day BMP - NS 50 cc/hr given poor p.o. intake  #FEN/GI - Diet: Regular with thin liquids, Ensure feeding supplement - IVF: NS 50 cc/hr as noted above  #DVT prophylaxis - Lovenox 40 mg subq injections  #Dispo: Unknown. Patient not a candidate for CIR or SNF due to uninsured status. Working with [place].  [name], MD Internal Medicine, PGY1 Pager: [phone]  12/31/2018,11:51 AM

## 2018-12-31 NOTE — Progress Notes (Signed)
  Speech Language Pathology Treatment: Cognitive-Linquistic  Patient Details Name: Philip Vasquez MRN: [id number] DOB: December 31, 1960 Today's Date: 12/31/2018 Time: 0071-2197 SLP Time Calculation (min) (ACUTE ONLY): 15 min  Assessment / Plan / Recommendation Clinical Impression  Pt was seen for cognitive-linguistic and aphasia treatment. He was moderately cooperative during the session. He demonstrated increased perseveration and reduced verbal output compared to when he was last seen by this SLP on 12/28/18. He was independently oriented to place and was oriented to time with mod cues for reasoning. Full supervision has been recommended for meals and he continues to demonstrate impulsive tendencies during meals. He was noted to attempt to consume the entire piece of roast beef from his tray at once. Pt required mod-max cues for problem solving related to safety within a home environment. The session was ultimately terminated per the pt's request when he stopped responded to the SLP's questions and cited fatigue as the reason. SLP will continue to follow pt.    HPI HPI: 58 year old gentleman with no past medical history who presented with AMS and right-sided weakness. MRI: Widely scattered small acute infarcts in the medial lefthemisphere involving left ACA and left ACA/MCA watershed area. UA positive for cocaine use.       SLP Plan  Continue with current plan of care       Recommendations                   Follow up Recommendations: 24 hour supervision/assistance;Inpatient Rehab SLP Visit Diagnosis: Aphasia (R47.01);Cognitive communication deficit (R41.841) Plan: Continue with current plan of care       Kenneth Lax I. Hardin Negus, [place], [place] Office number [phone] Pager [place] 12/31/2018, 4:21 PM

## 2019-01-01 LAB — BASIC METABOLIC PANEL
Anion gap: 7 (ref 5–15)
BUN: 29 mg/dL — ABNORMAL HIGH (ref 6–20)
CO2: 23 mmol/L (ref 22–32)
Calcium: 9.2 mg/dL (ref 8.9–10.3)
Chloride: 113 mmol/L — ABNORMAL HIGH (ref 98–111)
Creatinine, Ser: 1.56 mg/dL — ABNORMAL HIGH (ref 0.61–1.24)
GFR calc Af Amer: 56 mL/min — ABNORMAL LOW (ref >60)
GFR calc non Af Amer: 48 mL/min — ABNORMAL LOW (ref >60)
Glucose, Bld: 104 mg/dL — ABNORMAL HIGH (ref 70–99)
Potassium: 4.4 mmol/L (ref 3.5–5.1)
Sodium: 143 mmol/L (ref 135–145)

## 2019-01-01 MED ORDER — POLYETHYLENE GLYCOL 3350 17 G PO PACK
17.0000 g | PACK | Freq: Two times a day (BID) | ORAL | Status: DC
  Administered 2019-01-01 – 2019-03-26 (×112): 17 g via ORAL
  Filled 2019-01-01 (×127): qty 1

## 2019-01-01 NOTE — Progress Notes (Signed)
   Subjective: Patient was seen at the bedside on morning rounds today. The patient appears to be more conversive today.  When asked if the patient is in pain today he says yes.  When we asked where he says "all over" appears comfortable.  On chart review the patient has not had a bowel movement in the last 24 hours.  Objective:  Vital signs in last 24 hours: Vitals:   12/31/18 1815 12/31/18 2024 12/31/18 2357 01/01/19 0404  BP: (!) 150/101 (!) 147/96 (!) 159/95 (!) 139/98  Pulse: 79 76 65 66  Resp: 18 18 18 18   Temp: 98.7 F (37.1 C) 99.3 F (37.4 C) 98.6 F (37 C) 98.6 F (37 C)  TempSrc: Oral Oral Oral Oral  SpO2: 98% 98% 98% 99%  Weight:      Height:       Physical Exam: General: Laying in bed watching TV, NAD HEENT: NCAT CV: RRR, normal S1-S2, no murmurs rubs or gallops appreciated, no edema present in lower extremities PULM: Clear to auscultation bilaterally ABD: Nontender, normal bowel sounds  NEURO: Alert, able to wiggle right fingers otherwise diffuse R-sided weakness  Assessment/Plan:  Principal Problem:   Cerebral thrombosis with cerebral infarction Active Problems:   Cocaine use disorder (HCC)   Essential hypertension   Hyperlipidemia  In summary Philip Vasquez is a 58 year old gentleman who uses cocaine daily with no recorded past medical history who presents after experiencing right sided weakness, speech abnormalities and somnolence, and MRI head findings that showed widely scattered small acute infarcts in the medial left hemisphere involving left ACA and left ACA/MCA watershed area and chronic small vessel disease. His current deficits are right-sided weakness and dysarthria.  The patient is currently medically stable and has been optimized for discharge.  However, his disposition is unknown because he is not a candidate for CIR or SNF placement due to lack of insurance.  He is homeless so home health is not an option.  #Dysarthria #R sided weakness #L ACA/MCA   Infarcts:  MRI of the head showed widely scattered small acute infarcts in the medial left hemisphere involving left ACA and left ACA/MCA watershed area in addition to chronic small vessel disease. Neurology was consulted and has signed off. - Continue rosuvastatin 40 mg daily  - Continue aspirin 81 mg daily and Plavix 75 mg daily for 3 weeks (stop date October 5) followed by aspirin 81 mg alone - Hemoglobin A1c 6.2%, elevated but no need for metformin at this time. - PT/OT/SLP consulted. Recommend CIR or SNF - Follow-up with neurology in 6 weeks in the outpatient setting  #HTN: Out of the permissive hypertension window. BP 139/98 this AM.  - Continue metoprolol 25 mg BID - Continue amlodipine 10 mg daily - Continue Hydrlazine 50 mg TID - Continue labetalol 100 mg TID PRN   #AKI #CKD?: Cr on admission 1.90, GFR 40. No baseline available. Creatinine 1.56 yesterday.   - Every other day BMP  #FEN/GI - Diet: Regular with thin liquids, Ensure feeding supplement - IVF: none  #DVT prophylaxis - Lovenox 40 mg subq injections  #Dispo: Unknown. Patient not a candidate for CIR or SNF due to uninsured status. Working with [place].  [name], MD Internal Medicine, PGY1 Pager: [phone]  01/01/2019,1:57 PM

## 2019-01-01 NOTE — Progress Notes (Signed)
Physical Therapy Treatment Patient Details Name: Philip Vasquez MRN: [id number] DOB: 16-Sep-1960 Today's Date: 01/01/2019    History of Present Illness Philip Vasquez is a 58 year old gentleman with no past medical history who presented with AMS and right-sided weakness. MRI: Widely scattered small acute infarcts in the medial lefthemisphere involving left ACA and left ACA/MCA watershed area.    PT Comments    Patient received in bed, pleasant and willing to participate in PT but with ongoing expressive difficulties. Required MaxAx2 for functional bed mobility, from there Min-ModA to maintain upright sitting balance at EOB due to strong R Lateral lean. Performed 2 sit to stands in [place] with patient able to maintain upright sitting 1-2 minutes each time before fatiguing. He was returned to bed with maxAx2 and positioned to comfort with MaxAx2, left in bed with all needs met and bed alarm active this afternoon. Per rehab notes he was denied for CIR, now recommend SNF with 24/7 care moving forward.     Follow Up Recommendations  Supervision/Assistance - 24 hour;SNF     Equipment Recommendations  Other (comment)(defer)    Recommendations for Other Services       Precautions / Restrictions Precautions Precaution Comments: apneic, R UE/LE weakness Restrictions Weight Bearing Restrictions: No    Mobility  Bed Mobility Overal bed mobility: Needs Assistance Bed Mobility: Supine to Sit;Sit to Supine     Supine to sit: Max assist;+2 for physical assistance;HOB elevated Sit to supine: Max assist;+2 for physical assistance;HOB elevated   General bed mobility comments: patient helped using LEs, but did require MaxAx2 to come around to full sitting at EOB; Min-ModA to maintain upright sitting due to strong R lean  Transfers Overall transfer level: Needs assistance   Transfers: Sit to/from Stand Sit to Stand: Total assist;+2 safety/equipment         General transfer comment: stood twice  in sara lift with totalA from lift; able to maintain standing in lift for 1-2 minutes each time before fatiguing  Ambulation/Gait             General Gait Details: unable at this time   Stairs             Wheelchair Mobility    Modified Rankin (Stroke Patients Only)       Balance Overall balance assessment: Needs assistance Sitting-balance support: Single extremity supported;Bilateral upper extremity supported;Feet supported Sitting balance-Leahy Scale: Poor Sitting balance - Comments: Min-modA for sitting balance Postural control: Right lateral lean Standing balance support: Bilateral upper extremity supported;During functional activity Standing balance-Leahy Scale: Zero                              Cognition Arousal/Alertness: Awake/alert Behavior During Therapy: Flat affect Overall Cognitive Status: Impaired/Different from baseline Area of Impairment: Attention;Following commands;Safety/judgement;Problem solving;Orientation;Awareness;Memory                   Current Attention Level: Sustained   Following Commands: Follows one step commands consistently Safety/Judgement: Decreased awareness of safety;Decreased awareness of deficits Awareness: Intellectual Problem Solving: Difficulty sequencing;Requires verbal cues;Requires tactile cues;Slow processing        Exercises      General Comments        Pertinent Vitals/Pain Pain Assessment: Faces Pain Score: 0-No pain Faces Pain Scale: No hurt Pain Intervention(s): Limited activity within patient's tolerance;Monitored during session    Home Living  Prior Function            PT Goals (current goals can now be found in the care plan section) Acute Rehab PT Goals Patient Stated Goal: pt unable to state due to expressive difficulties and lethargy PT Goal Formulation: Patient unable to participate in goal setting Time For Goal Achievement:  01/03/19 Potential to Achieve Goals: Fair Progress towards PT goals: Progressing toward goals    Frequency    Min 4X/week      PT Plan Discharge plan needs to be updated    Co-evaluation              AM-PAC PT "6 Clicks" Mobility   Outcome Measure  Help needed turning from your back to your side while in a flat bed without using bedrails?: A Lot Help needed moving from lying on your back to sitting on the side of a flat bed without using bedrails?: A Lot Help needed moving to and from a bed to a chair (including a wheelchair)?: Total Help needed standing up from a chair using your arms (e.g., wheelchair or bedside chair)?: Total Help needed to walk in hospital room?: Total Help needed climbing 3-5 steps with a railing? : Total 6 Click Score: 8    End of Session Equipment Utilized During Treatment: Gait belt Activity Tolerance: Patient tolerated treatment well Patient left: with call bell/phone within reach;with bed alarm set;in bed   PT Visit Diagnosis: Other abnormalities of gait and mobility (R26.89);Hemiplegia and hemiparesis;Muscle weakness (generalized) (M62.81) Hemiplegia - Right/Left: Right     Time: 1464-3142 PT Time Calculation (min) (ACUTE ONLY): 23 min  Charges:  $Therapeutic Activity: 23-37 mins                     Philip Vasquez PT, DPT, CBIS  Supplemental Physical Therapist Philip Vasquez    Pager [phone] Acute Rehab Office [phone]

## 2019-01-01 NOTE — Plan of Care (Signed)
  Problem: Education: Goal: Knowledge of General Education information will improve Description Including pain rating scale, medication(s)/side effects and non-pharmacologic comfort measures Outcome: Progressing   Problem: Health Behavior/Discharge Planning: Goal: Ability to manage health-related needs will improve Outcome: Progressing   Problem: Clinical Measurements: Goal: Ability to maintain clinical measurements within normal limits will improve Outcome: Progressing Goal: Will remain free from infection Outcome: Progressing Goal: Diagnostic test results will improve Outcome: Progressing Goal: Respiratory complications will improve Outcome: Progressing Goal: Cardiovascular complication will be avoided Outcome: Progressing   Problem: Activity: Goal: Risk for activity intolerance will decrease Outcome: Progressing   Problem: Nutrition: Goal: Adequate nutrition will be maintained Outcome: Progressing   Problem: Coping: Goal: Level of anxiety will decrease Outcome: Progressing   Problem: Elimination: Goal: Will not experience complications related to bowel motility Outcome: Progressing Goal: Will not experience complications related to urinary retention Outcome: Progressing   Problem: Pain Managment: Goal: General experience of comfort will improve Outcome: Progressing   Problem: Safety: Goal: Ability to remain free from injury will improve Outcome: Progressing   Problem: Skin Integrity: Goal: Risk for impaired skin integrity will decrease Outcome: Progressing   Problem: Education: Goal: Knowledge of disease or condition will improve Outcome: Progressing Goal: Knowledge of secondary prevention will improve Outcome: Progressing Goal: Knowledge of patient specific risk factors addressed and post discharge goals established will improve Outcome: Progressing Goal: Individualized Educational Video(s) Outcome: Progressing   Problem: Coping: Goal: Will verbalize  positive feelings about self Outcome: Progressing Goal: Will identify appropriate support needs Outcome: Progressing   Problem: Health Behavior/Discharge Planning: Goal: Ability to manage health-related needs will improve Outcome: Progressing   Problem: Self-Care: Goal: Ability to participate in self-care as condition permits will improve Outcome: Progressing Goal: Verbalization of feelings and concerns over difficulty with self-care will improve Outcome: Progressing Goal: Ability to communicate needs accurately will improve Outcome: Progressing   Problem: Nutrition: Goal: Risk of aspiration will decrease Outcome: Progressing Goal: Dietary intake will improve Outcome: Progressing   Problem: Ischemic Stroke/TIA Tissue Perfusion: Goal: Complications of ischemic stroke/TIA will be minimized Outcome: Progressing  Philip Vasquez, BSN, RN 

## 2019-01-02 LAB — BASIC METABOLIC PANEL
Anion gap: 8 (ref 5–15)
BUN: 28 mg/dL — ABNORMAL HIGH (ref 6–20)
CO2: 19 mmol/L — ABNORMAL LOW (ref 22–32)
Calcium: 9.1 mg/dL (ref 8.9–10.3)
Chloride: 113 mmol/L — ABNORMAL HIGH (ref 98–111)
Creatinine, Ser: 1.48 mg/dL — ABNORMAL HIGH (ref 0.61–1.24)
GFR calc Af Amer: 60 mL/min — ABNORMAL LOW (ref >60)
GFR calc non Af Amer: 51 mL/min — ABNORMAL LOW (ref >60)
Glucose, Bld: 110 mg/dL — ABNORMAL HIGH (ref 70–99)
Potassium: 4.2 mmol/L (ref 3.5–5.1)
Sodium: 140 mmol/L (ref 135–145)

## 2019-01-02 MED ORDER — HYDRALAZINE HCL 50 MG PO TABS
75.0000 mg | ORAL_TABLET | Freq: Three times a day (TID) | ORAL | Status: DC
  Administered 2019-01-02 – 2019-05-04 (×367): 75 mg via ORAL
  Filled 2019-01-02 (×371): qty 1

## 2019-01-02 NOTE — Progress Notes (Signed)
   Subjective: The patient remains interactive but withdrawn. He nodded that he was going to watch football today but did not say which team. He did not endorse pain or discomfort.   Objective:  Vital signs in last 24 hours: Vitals:   01/01/19 1807 01/01/19 1939 01/01/19 2351 01/02/19 0359  BP: (!) 141/97 (!) 142/100 (!) 128/105 (!) 126/98  Pulse: 83 90 72 77  Resp: (!) 21 18 17 17   Temp: 99.1 F (37.3 C) 98.7 F (37.1 C) 98.6 F (37 C) 98.4 F (36.9 C)  TempSrc: Oral Oral Oral Oral  SpO2: 96% 98% 94% 98%  Weight:      Height:       General: In no acute distress, afebrile, nondiaphoretic HEENT: PEERL, EMO intact Cardio: RRR, no mrg's  Pulmonary: CTA bilaterally, no wheezing or crackles  Abdomen: Bowel sounds normal, soft, nontender  MSK: BLE nontender, nonedematous Neuro: Alert, strength 1/5 in the upper right extremity, 0/5 right lower.  5/5 upper/lower left extremities  Principal Problem:   Cerebral thrombosis with cerebral infarction Active Problems:   Cocaine use disorder (HCC)   Essential hypertension   Hyperlipidemia  In summary Mr. Genet is a 58 year old male w/ a PMHx of daily cocaine use disorder who presented with acute onset right-sided weakness, speech abnormalities and somnolence and was subsequently found to have widely scattered small acute infarcts in the medial left hemisphere involving left ACA and left ACA/MCA watershed area and chronic small vessel disease.  The patient symptoms are yet to improve since admission.  Unfortunately disposition has been a challenge given patient's lack of insurance and/or home or home support.  He will need aggressive rehabilitation moving forward.  The patient is not clear for discharge.  Assessment/Plan: CVA: Previously stated.  Currently unimproved with persistent loss of strength on the right upper and lower extremities and dysarthria. - Continue rosuvastatin 40 mg daily -Continue ASA 81 mg daily -Continue Plavix 75 mg  daily, stop date October 5 - Continue PT/OT/SLP - Appreciate social work's assistance with placement  Hypertension: Patient remains hypertensive.  Given his longstanding cocaine use disorder it will be challenging to maintain adequate control in the acute setting.  However, we are still uptitrating his medications to get better control of this.  We will start an ACE/ARB follow improvement and stability of the patient's renal function. - Continue metoprolol 20 mg twice daily -Continue amlodipine 10 mg daily -Continue hydralazine but increase to 75 mg 3 times daily -Continue labetalol 100 mg 3 times daily as needed  Dispo: Anticipated discharge in approximately an indefinite number of day(s).   Kathi Ludwig, MD 01/02/2019, 6:52 AM Pager: # [phone]

## 2019-01-03 MED ORDER — BISACODYL 10 MG RE SUPP
10.0000 mg | Freq: Every day | RECTAL | Status: DC | PRN
  Administered 2019-01-03: 10 mg via RECTAL
  Filled 2019-01-03: qty 1

## 2019-01-03 MED ORDER — SENNOSIDES-DOCUSATE SODIUM 8.6-50 MG PO TABS
2.0000 | ORAL_TABLET | Freq: Every day | ORAL | Status: DC
  Administered 2019-01-03 – 2019-06-13 (×129): 2 via ORAL
  Filled 2019-01-03 (×138): qty 2

## 2019-01-03 NOTE — Progress Notes (Addendum)
Subjective: The patient stated that he had enjoyed watching football the prior day and that his team was the [employer].  He again asked if he could go home today.  He understands he will need rehabilitation prior to this and that we were working on disposition at this time.  He denied pain or other complaints today.  Objective:  Vital signs in last 24 hours: Vitals:   01/02/19 2006 01/03/19 0014 01/03/19 0333 01/03/19 0821  BP: (!) 128/95 (!) 141/100 (!) 158/98 (!) 144/99  Pulse: 77 65 72 66  Resp: 17 17 17 18   Temp: 98.6 F (37 C) 98.6 F (37 C) 98.1 F (36.7 C) 97.9 F (36.6 C)  TempSrc: Oral Oral Oral Oral  SpO2: 99% 97% 100% 99%  Weight:      Height:       General: A/O x4, in no acute distress, afebrile, nondiaphoretic HEENT: PEERL, EMO intact Abdomen: Bowel sounds normal, soft, nontender  MSK: BLE nontender, nonedematous Neuro: Alert, strength 1/5 in the upper and 0/5 lower right extremities, patient is dysarthria unchanged Psych: Appropriate affect, not depressed in appearance, engages well  CMP Latest Ref Rng & Units 01/02/2019 01/01/2019 12/30/2018  Glucose 70 - 99 mg/dL 110(H) 104(H) 113(H)  BUN 6 - 20 mg/dL 28(H) 29(H) 44(H)  Creatinine 0.61 - 1.24 mg/dL 1.48(H) 1.56(H) 1.85(H)  Sodium 135 - 145 mmol/L 140 143 142  Potassium 3.5 - 5.1 mmol/L 4.2 4.4 4.6  Chloride 98 - 111 mmol/L 113(H) 113(H) 112(H)  CO2 22 - 32 mmol/L 19(L) 23 20(L)  Calcium 8.9 - 10.3 mg/dL 9.1 9.2 9.2  Total Protein 6.5 - 8.1 g/dL - - -  Total Bilirubin 0.3 - 1.2 mg/dL - - -  Alkaline Phos 38 - 126 U/L - - -  AST 15 - 41 U/L - - -  ALT 0 - 44 U/L - - -   CBC Latest Ref Rng & Units 12/25/2018 12/21/2018 12/20/2018  WBC 4.0 - 10.5 K/uL 6.9 6.7 7.2  Hemoglobin 13.0 - 17.0 g/dL 14.7 13.7 14.0  Hematocrit 39.0 - 52.0 % 44.9 41.1 42.2  Platelets 150 - 400 K/uL 239 210 222    Patient Encounter Summary: In summary Philip Vasquez is a 58 year old male w/ a PMHx of daily cocaine use disorder who presented  with acute onset right-sided weakness, speech abnormalities and somnolence and was subsequently found to have widely scattered small acute infarcts in the medial left hemisphere involving left ACA and left ACA/MCA watershed area and chronic small vessel disease.  The patient symptoms are yet to improve since admission.  Unfortunately, his disposition has been a challenge given patient's lack of insurance and/or home or home support.  He will need aggressive rehabilitation moving forward.  The patient is clear for discharge.  Assessment/Plan:  Principal Problem:   Cerebral thrombosis with cerebral infarction Active Problems:   Cocaine use disorder (HCC)   Essential hypertension   Hyperlipidemia  CVA: Currently unimproved with persistent loss of strength on the right upper and lower extremities and dysarthria.  Appears to be in better mood today. -Continue with simvastatin 40 mg daily -Continue ASA 81 mg daily -Continue Plavix 75 mg daily stop date October 5 - Continue PT/OT/SLP - Appreciate social work's assistance with placement  Hypertension: Patient remains hypertensive despite aggressive treatment and dispenses medications.  Given his longstanding cocaine use disorder it will be challenging to maintain adequate control in the acute setting.  However, we are still uptitrating his medications to get  better control of this.  We will start an ACE/ARB following improvement and stability of the patient's renal function. - Continue metoprolol 25 mg twice daily -Continue amlodipine 10 mg daily -Continue hydralazine but increased to 75 mg 3 times daily -Continue labetalol 100 mg 3 times daily as needed -BMP in a.m.  Dispo: Anticipated discharge in approximately an indefinite number of day(s).   Lanelle Bal, MD 01/03/2019, 10:19 AM Pager: # [phone]

## 2019-01-04 LAB — BASIC METABOLIC PANEL
Anion gap: 7 (ref 5–15)
BUN: 33 mg/dL — ABNORMAL HIGH (ref 6–20)
CO2: 20 mmol/L — ABNORMAL LOW (ref 22–32)
Calcium: 8.9 mg/dL (ref 8.9–10.3)
Chloride: 111 mmol/L (ref 98–111)
Creatinine, Ser: 1.31 mg/dL — ABNORMAL HIGH (ref 0.61–1.24)
GFR calc Af Amer: >60 mL/min (ref >60)
GFR calc non Af Amer: 60 mL/min — ABNORMAL LOW (ref >60)
Glucose, Bld: 122 mg/dL — ABNORMAL HIGH (ref 70–99)
Potassium: 4.2 mmol/L (ref 3.5–5.1)
Sodium: 138 mmol/L (ref 135–145)

## 2019-01-04 MED ORDER — LOSARTAN POTASSIUM 25 MG PO TABS
25.0000 mg | ORAL_TABLET | Freq: Every day | ORAL | Status: DC
  Administered 2019-01-04 – 2019-01-14 (×11): 25 mg via ORAL
  Filled 2019-01-04 (×11): qty 1

## 2019-01-04 NOTE — Progress Notes (Signed)
Pt had a large BM and it was black, soft and smelled like a GI bleed but no bright red blood noted. MD paged and notified; will continue to closely monitor. Delia Heady RN

## 2019-01-04 NOTE — Progress Notes (Signed)
   Subjective: Patient was seen at the bedside on morning rounds today. The patient says he is hungry. Has no other complaints today.  Objective:  Vital signs in last 24 hours: Vitals:   01/03/19 1650 01/03/19 2056 01/04/19 0430 01/04/19 0812  BP: (!) 151/93 (!) 152/85 (!) 141/96 (!) 135/97  Pulse: 88 100 78 86  Resp: 16 18 18 17   Temp: 98.7 F (37.1 C) 98.2 F (36.8 C) 98.2 F (36.8 C) 98.1 F (36.7 C)  TempSrc:  Oral Oral Oral  SpO2: 97% 98% 100% 97%  Weight:      Height:       Physical Exam: General: Laying in bed, NAD HEENT: NCAT CV: RRR, normal S1-S2, no murmurs rubs or gallops appreciated, no edema present in lower extremities PULM: Clear to auscultation bilaterally ABD: Nontender, normal bowel sounds  NEURO: Alert, R upper extremity grip strength 3/5 but otherwise 0/5.  Are lower extremities 0/5.  Left upper and lower extremity 5/5  Assessment/Plan:  Principal Problem:   Cerebral thrombosis with cerebral infarction Active Problems:   Cocaine use disorder (HCC)   Essential hypertension   Hyperlipidemia  In summary Philip Vasquez is a 58 year old gentleman who uses cocaine daily with no recorded past medical history who presented  left ACA/MCA watershed infarcts. His current deficits are right-sided weakness and dysarthria. The patient is currently medically stable and has been optimized for discharge. However, his disposition is unknown because he is not a candidate for CIR or SNF placement due to lack of insurance. He is homeless so home health is not an option.  #Dysarthria #R sided weakness #L ACA/MCA Infarcts: MRI of the head showed widely scattered small acute infarcts in the medial left hemisphere involving left ACA and left ACA/MCA watershed area in addition to chronic small vessel disease. Neurology was consulted and has signed off. - Continue rosuvastatin 40 mg daily  - Continue aspirin 81 mg daily and Plavix 75 mg daily for 3 weeks (stop date October 5)  followed by aspirin 81 mg alone - Hemoglobin A1c 6.2%, elevated but no need for metformin at this time. - PT/OT/SLP consulted. Recommend CIR or SNF - Follow-up with neurology in 6 weeks in the outpatient setting  #HTN: Out of the permissive hypertension window. BP 135/97 this AM. - Start losartan 25 mg daily today  - Continue metoprolol 25 mg BID - Continue amlodipine 10 mg daily - Continue Hydrlazine 50 mg TID - Continue labetalol 100 mg TID PRN   #AKI #CKD?: Cr on admission 1.90, GFR 40. No baseline available. Creatinine 1.31 today - Every other day BMP  #FEN/GI - Diet: Regular with thin liquids, Ensure feeding supplement - IVF: none  #DVT prophylaxis - Lovenox 40 mg subq injections  #Dispo: Unknown. Patient not a candidate for CIR or SNF due to uninsured status. Working with [facility].  [name], MD Internal Medicine, PGY1 Pager: [phone]  01/04/2019,11:47 AM

## 2019-01-04 NOTE — Progress Notes (Signed)
Nutrition Follow-up  DOCUMENTATION CODES:   Obesity unspecified  INTERVENTION:  Continue Ensure Enlive po BID, each supplement provides 350 kcal and 20 grams of protein  Continue Magic cup TID with meals, each supplement provides 290 kcal and 9 grams of protein  Encourage adequate PO intake.   NUTRITION DIAGNOSIS:   Inadequate oral intake related to lethargy/confusion as evidenced by estimated needs(reported meal completion); improving  GOAL:   Patient will meet greater than or equal to 90% of their needs; progressing  MONITOR:   PO intake, Supplement acceptance, Skin, Weight trends, Labs, I & O's  REASON FOR ASSESSMENT:   Malnutrition Screening Tool    ASSESSMENT:   58 year old man with hyperlipidemia, hypertension, and cocaine use disorder admitted with right upper and lower extremity weakness admitted with scattered acute infarcts in the medial left ACA and MCA watershed distribution.  Meal completion has been varied from 5-100%, however pt reports hunger today and able to 100% complete this lunch tray today. Pt currently has Ensure and Magic cup ordered and has been consuming them. RD to continue with current orders to aid in caloric and protein needs. Labs and medications reviewed.   Diet Order:   Diet Order            Diet regular Room service appropriate? Yes; Fluid consistency: Thin  Diet effective now              EDUCATION NEEDS:   Not appropriate for education at this time  Skin:  Skin Assessment: Reviewed RN Assessment  Last BM:  9/28  Height:   Ht Readings from Last 1 Encounters:  12/19/18 5\' 9"  (1.753 m)    Weight:   Wt Readings from Last 1 Encounters:  12/19/18 102.1 kg    Ideal Body Weight:  72.7 kg  BMI:  Body mass index is 33.23 kg/m.  Estimated Nutritional Needs:   Kcal:  2000-2200  Protein:  100-115 grams  Fluid:  >/= 2 L/day    Corrin Parker, MS, RD, LDN Pager # [phone] After hours/ weekend pager # [phone]

## 2019-01-05 DIAGNOSIS — K921 Melena: Secondary | ICD-10-CM

## 2019-01-05 LAB — CBC
HCT: 35.3 % — ABNORMAL LOW (ref 39.0–52.0)
HCT: 34.3 % — ABNORMAL LOW (ref 39.0–52.0)
Hemoglobin: 11.9 g/dL — ABNORMAL LOW (ref 13.0–17.0)
Hemoglobin: 11.1 g/dL — ABNORMAL LOW (ref 13.0–17.0)
MCH: 30.4 pg (ref 26.0–34.0)
MCH: 29.4 pg (ref 26.0–34.0)
MCHC: 33.7 g/dL (ref 30.0–36.0)
MCHC: 32.4 g/dL (ref 30.0–36.0)
MCV: 90.3 fL (ref 80.0–100.0)
MCV: 91 fL (ref 80.0–100.0)
Platelets: 243 10*3/uL (ref 150–400)
Platelets: 263 10*3/uL (ref 150–400)
RBC: 3.91 MIL/uL — ABNORMAL LOW (ref 4.22–5.81)
RBC: 3.77 MIL/uL — ABNORMAL LOW (ref 4.22–5.81)
RDW: 12.3 % (ref 11.5–15.5)
RDW: 12.3 % (ref 11.5–15.5)
WBC: 8 10*3/uL (ref 4.0–10.5)
WBC: 7.2 10*3/uL (ref 4.0–10.5)
nRBC: 0 % (ref 0.0–0.2)
nRBC: 0 % (ref 0.0–0.2)

## 2019-01-05 LAB — OCCULT BLOOD X 1 CARD TO LAB, STOOL: Fecal Occult Bld: POSITIVE — AB

## 2019-01-05 MED ORDER — PANTOPRAZOLE SODIUM 40 MG PO TBEC
40.0000 mg | DELAYED_RELEASE_TABLET | Freq: Two times a day (BID) | ORAL | Status: DC
  Administered 2019-01-05 – 2019-02-15 (×82): 40 mg via ORAL
  Filled 2019-01-05 (×82): qty 1

## 2019-01-05 NOTE — Progress Notes (Signed)
  Speech Language Pathology Treatment: Cognitive-Linquistic  Patient Details Name: Philip Vasquez MRN: [id number] DOB: [birth year] Today's Date: 01/05/2019 Time: 6503-5465 SLP Time Calculation (min) (ACUTE ONLY): 17 min  Assessment / Plan / Recommendation Clinical Impression  Pt was seen for cognitive-linguistic treatment and was cooperative throughout the session. His level of participation and engagement were improved compared to during the last session. He was able to provide information regarding his previous job pushing buggies at [employer] and indicate that he found working with the customers to be the most enjoyable. After approximately ten minutes pt requested that the TV be turned back on but was amenable to completion of one more task prior to completing the session and turning the TV back on. He required mod-max cues for verbal sequencing of tasks and mod cues for identification of items needed for completion of various tasks. He demonstrated 50% accuracy with 4-item immediate recall increasing to 75% accuracy with mod cues. He was able to recall 5 items with mod cues. He achieved 70% accuracy with money management (money calculation of dollar and coin values) increasing to 90% with mod cues. SLP will continue to follow pt.    HPI HPI: 58 year old gentleman with no past medical history who presented with AMS and right-sided weakness. MRI: Widely scattered small acute infarcts in the medial lefthemisphere involving left ACA and left ACA/MCA watershed area. UA positive for cocaine use.       SLP Plan  Continue with current plan of care       Recommendations                   Follow up Recommendations: 24 hour supervision/assistance;Inpatient Rehab SLP Visit Diagnosis: Aphasia (R47.01);Cognitive communication deficit (R41.841) Plan: Continue with current plan of care       Ravon Mcilhenny I. Hardin Negus, [place], [place] Office number [phone] Pager  [facility] 01/05/2019, 10:45 AM

## 2019-01-05 NOTE — Progress Notes (Signed)
Physical Therapy Treatment Patient Details Name: Philip Vasquez MRN: [id number] DOB: [birth year] Today's Date: 01/05/2019    History of Present Illness Philip Vasquez is a 58 year old gentleman with no past medical history who presented with AMS and right-sided weakness. MRI: Widely scattered small acute infarcts in the medial lefthemisphere involving left ACA and left ACA/MCA watershed area.    PT Comments    Patient is making gradual progress toward PT goals. [place] Dec Lift utilized for functional transfers. Continue to progress as tolerated with anticipated d/c to SNF for further skilled PT services.     Follow Up Recommendations  Supervision/Assistance - 24 hour;SNF     Equipment Recommendations  Other (comment)(defer)    Recommendations for Other Services       Precautions / Restrictions Precautions Precautions: Fall Precaution Comments: apneic, R UE/LE weakness Restrictions Weight Bearing Restrictions: No    Mobility  Bed Mobility Overal bed mobility: Needs Assistance Bed Mobility: Supine to Sit     Supine to sit: Max assist;HOB elevated     General bed mobility comments: assist to bring R LE and hip to EOB and to elevate trunk into sitting   Transfers Overall transfer level: Needs assistance   Transfers: Sit to/from Stand           General transfer comment: pt stood with use of [employer]; tactile cues for hip extension, weight shifting to midling, and for R knee extension in standing  Ambulation/Gait                 Stairs             Wheelchair Mobility    Modified Rankin (Stroke Patients Only) Modified Rankin (Stroke Patients Only) Modified Rankin: Severe disability     Balance Overall balance assessment: Needs assistance Sitting-balance support: Single extremity supported;Bilateral upper extremity supported;Feet supported Sitting balance-Leahy Scale: Poor   Postural control: Right lateral lean Standing balance support: Bilateral upper  extremity supported;During functional activity Standing balance-Leahy Scale: Zero                              Cognition Arousal/Alertness: Awake/alert Behavior During Therapy: Flat affect Overall Cognitive Status: Impaired/Different from baseline                                 General Comments: pt mostly non verbal during session and following single step cues inconsistently       Exercises      General Comments        Pertinent Vitals/Pain Pain Assessment: No/denies pain    Home Living                      Prior Function            PT Goals (current goals can now be found in the care plan section) Progress towards PT goals: Progressing toward goals    Frequency    Min 4X/week      PT Plan Current plan remains appropriate    Co-evaluation              AM-PAC PT "6 Clicks" Mobility   Outcome Measure  Help needed turning from your back to your side while in a flat bed without using bedrails?: A Lot Help needed moving from lying on your back to sitting on the side of a flat bed without using  bedrails?: A Lot Help needed moving to and from a bed to a chair (including a wheelchair)?: Total Help needed standing up from a chair using your arms (e.g., wheelchair or bedside chair)?: Total Help needed to walk in hospital room?: Total Help needed climbing 3-5 steps with a railing? : Total 6 Click Score: 8    End of Session Equipment Utilized During Treatment: Gait belt Activity Tolerance: Patient tolerated treatment well Patient left: with call bell/phone within reach;in chair;with chair alarm set Nurse Communication: Mobility status;Need for lift equipment PT Visit Diagnosis: Other abnormalities of gait and mobility (R26.89);Hemiplegia and hemiparesis;Muscle weakness (generalized) (M62.81) Hemiplegia - Right/Left: Right     Time: 4650-3546 PT Time Calculation (min) (ACUTE ONLY): 33 min  Charges:  $Gait Training: 8-22  mins $Therapeutic Activity: 8-22 mins                     Earney Navy, PTA Acute Rehabilitation Services Pager: [phone] Office: [phone]     Darliss Cheney 01/05/2019, 4:41 PM

## 2019-01-05 NOTE — Progress Notes (Signed)
   Subjective: Patient was seen at the bedside on morning rounds today. Yesterday, the nursing staff reported a large, black and tarry bowel movement yesterday. The patient confirmed that today too. Says he had breakfast this morning. No complaints at this time.   Objective:  Vital signs in last 24 hours: Vitals:   01/04/19 1644 01/04/19 2034 01/05/19 0014 01/05/19 0351  BP: (!) 126/92 (!) 140/97 128/90 (!) 121/97  Pulse: 100 98 76 76  Resp: 19 16 18 16   Temp: 98.2 F (36.8 C) 98.5 F (36.9 C) 98.4 F (36.9 C) 99.1 F (37.3 C)  TempSrc: Oral Oral Oral Oral  SpO2: 96% 98% 98% 100%  Weight:      Height:       Physical Exam: General: Resting in bed, NAD HEENT: NCAT CV: RRR, normal S1 & S2, no murmurs rubs or gallops appreciated PULM: CTAB ABD: soft, nontender NEURO: diffuse R sided weakness   Assessment/Plan:  Principal Problem:   Cerebral thrombosis with cerebral infarction Active Problems:   Cocaine use disorder (HCC)   Essential hypertension   Hyperlipidemia  In summary Mr. Leser is a 58 year old gentleman who uses cocaine daily with no recorded past medical history who presented  left ACA/MCA watershed infarcts. His current deficits are right-sided weakness and dysarthria. The patient is currently medically stable and has been optimized for discharge. However, his disposition is unknown because he is not a candidate for CIR or SNF placement due to lack of insurance. He is homeless so home health is not an option.  #Melena: Reportedly had large black bowel moment yesterday. Hgb was rechecked this AM and was 11.9 from 14.7 (11 days ago). FOBT positive  - Recheck CBC this afternoon.  If hemoglobin is decreased again, this may suggest an active bleed.  Will need to reach out to GI. - Consider discontinuing aspirin and Plavix if patient is actively bleeding.  We will continue for now.  #Dysarthria #R sided weakness #L ACA/MCA Infarcts: MRI of the head showed widely  scattered small acute infarcts in the medial left hemisphere involving left ACA and left ACA/MCA watershed area in addition to chronic small vessel disease. Neurology was consulted and has signed off. - Continue rosuvastatin 40 mg daily  - Continue aspirin 81 mg daily and Plavix 75 mg daily for 3 weeks (stop date October 5) followed by aspirin 81 mg alone - Hemoglobin A1c 6.2%, elevated but no need for metformin at this time. - PT/OT/SLP consulted. Recommend CIR or SNF - Follow-up with neurology in 6 weeks in the outpatient setting  #HTN: Out of the permissive hypertension window. BP 135/97 this AM. - Losartan 25 mg daily today  - Continue metoprolol 25 mg BID - Continue amlodipine 10 mg daily - Continue Hydrlazine 50 mg TID - Continue labetalol 100 mg TID PRN   #AKI #CKD?: Cr on admission 1.90, GFR 40. No baseline available. Creatinine 1.31 today - Every other day BMP  #FEN/GI - Diet: Regular with thin liquids, Ensure feeding supplement - IVF: none  #DVT prophylaxis - Lovenox 40 mg subq injections  #Dispo: Unknown. Patient not a candidate for CIR or SNF due to uninsured status. Working with [place].  [name], MD Internal Medicine, PGY1 Pager: [phone]  01/05/2019,1:47 PM

## 2019-01-06 DIAGNOSIS — D62 Acute posthemorrhagic anemia: Secondary | ICD-10-CM

## 2019-01-06 LAB — CBC
HCT: 33.2 % — ABNORMAL LOW (ref 39.0–52.0)
Hemoglobin: 10.8 g/dL — ABNORMAL LOW (ref 13.0–17.0)
MCH: 29.8 pg (ref 26.0–34.0)
MCHC: 32.5 g/dL (ref 30.0–36.0)
MCV: 91.7 fL (ref 80.0–100.0)
Platelets: 242 10*3/uL (ref 150–400)
RBC: 3.62 MIL/uL — ABNORMAL LOW (ref 4.22–5.81)
RDW: 12.4 % (ref 11.5–15.5)
WBC: 8.2 10*3/uL (ref 4.0–10.5)
nRBC: 0 % (ref 0.0–0.2)

## 2019-01-06 LAB — BASIC METABOLIC PANEL
Anion gap: 7 (ref 5–15)
BUN: 27 mg/dL — ABNORMAL HIGH (ref 6–20)
CO2: 20 mmol/L — ABNORMAL LOW (ref 22–32)
Calcium: 8.4 mg/dL — ABNORMAL LOW (ref 8.9–10.3)
Chloride: 110 mmol/L (ref 98–111)
Creatinine, Ser: 1.36 mg/dL — ABNORMAL HIGH (ref 0.61–1.24)
GFR calc Af Amer: >60 mL/min (ref >60)
GFR calc non Af Amer: 57 mL/min — ABNORMAL LOW (ref >60)
Glucose, Bld: 109 mg/dL — ABNORMAL HIGH (ref 70–99)
Potassium: 3.9 mmol/L (ref 3.5–5.1)
Sodium: 137 mmol/L (ref 135–145)

## 2019-01-06 NOTE — Discharge Summary (Addendum)
Name: Philip Vasquez MRN: [id number] DOB: 04/06/1961 58 y.o. PCP: Patient, No Pcp Per  Date of Admission: 12/19/2018  7:45 AM Date of Discharge:  Attending Physician: Gust RungHoffman, Erik C, DO  Discharge Diagnosis: 1. L ACA/MCA watershed infarct 2. HTN 3. Melena  4. Hematuria. Urinary retention 5. Depression  Discharge Medications: Allergies as of 06/14/2019   No Known Allergies      Medication List     TAKE these medications    aspirin 81 MG EC tablet Take 1 tablet (81 mg total) by mouth daily. Start taking on: June 15, 2019   Baclofen 5 MG Tabs Take 5 mg by mouth 2 (two) times daily as needed for muscle spasms.   feeding supplement (ENSURE ENLIVE) Liqd Take 237 mLs by mouth daily in the afternoon.   feeding supplement (PRO-STAT SUGAR FREE 64) Liqd Take 30 mLs by mouth 2 (two) times daily.   FLUoxetine 40 MG capsule Commonly known as: PROZAC Take 2 capsules (80 mg total) by mouth daily. Start taking on: June 15, 2019   folic acid 1 MG tablet Commonly known as: FOLVITE Take 1 tablet (1 mg total) by mouth daily. Start taking on: June 15, 2019   Gerhardt's butt cream Crea Apply 1 application topically 2 (two) times daily.   hydrALAZINE 25 MG tablet Commonly known as: APRESOLINE Take 1 tablet (25 mg total) by mouth 3 (three) times daily.   hydrocerin Crea Apply 1 application topically 2 (two) times daily.   loratadine 10 MG tablet Commonly known as: CLARITIN Take 1 tablet (10 mg total) by mouth daily. Start taking on: June 15, 2019   metoprolol tartrate 25 MG tablet Commonly known as: LOPRESSOR Take 1 tablet (25 mg total) by mouth 2 (two) times daily.   mirtazapine 15 MG tablet Commonly known as: REMERON Take 1 tablet (15 mg total) by mouth at bedtime.   multivitamin with minerals Tabs tablet Take 1 tablet by mouth daily. Start taking on: June 15, 2019   pantoprazole 40 MG tablet Commonly known as: PROTONIX Take 1 tablet (40 mg total) by mouth daily.  Start taking on: June 15, 2019   polyethylene glycol 17 g packet Commonly known as: MIRALAX / GLYCOLAX Take 17 g by mouth daily. Start taking on: June 15, 2019   rosuvastatin 40 MG tablet Commonly known as: CRESTOR Take 1 tablet (40 mg total) by mouth daily at 6 PM.   senna-docusate 8.6-50 MG tablet Commonly known as: Senokot-S Take 2 tablets by mouth at bedtime.   thiamine 100 MG tablet Take 1 tablet (100 mg total) by mouth daily. Start taking on: June 15, 2019        Disposition and follow-up:   Philip Vasquez [facility] Stable condition.  At the hospital follow up visit please address:  1.  L ACA/MCA watershed infarct: Likely secondary to daily cocaine use. Deficits are R sided weakness and dysarthria. Secondary stroke w/u was unremarkable. Hgb a1c was 6.2%. - Rosuvastatin 40 mg daily -Aspirin 81mg  daily  2. HTN: Pt was not on any medications when he initially presented. Allowed permissive hypertension for the first 48hrs then slowly added on antihypertensive medications. Discharged on the following - Metoprolol 25 mg BID - Hydralazine 25 mg TID  3. Melena: Pt started having large, black bowel movements during his course. FOBT +. GI was consulted. Aspirin and plavix were held with resolution in bleeding. He completed course of plavix and will be continued on asa  81 mg qd.   4. Depression: Patients condition and disposition caused significant depression with decreased po intake. He was started on remeron 15 mg qhs and prozac 80 mg qd.   5. Urethral Bleed/urinary retention: unclear etiology. Failed several voiding trials. Seen by urology who recommended continued foley catheterization and for him to follow up with them outpatient. Hematuria resolved prior to discharge.  4.  Labs / imaging needed at time of follow-up: cbc, bmp  Follow-up Appointments: Follow-up Information     [facility] Follow up.    Contact information: [address] [place] [address] [place] [address] [phone]         [facility] Follow up.   Contact information: [address] [place], [address] [place] [address] [phone]        [facility] Follow up.   Contact information: [address] [place] [address] [phone]            Hospital Course by problem list: 1. L ACA/MCA watershed infarct: In summary Philip Vasquez is a 58 year old gentleman who used cocaine daily with no recorded past medical history who presented with a left ACA/MCA watershed infarcts in the setting of cocaine use. Secondary stroke work up was initiated under the guidance of Neurology. Started on high intensity statin. Hgb A1c 6.2%. - Rosuvastatin 40 mg daily  2. HTN: Permissive HTN was allowed for the first 48 hours post stroke. We added on antihypertensive medications slowly to get the patient normotensive.  - Metoprolol 25 mg BID - Hydralazine 25 mg TID  3. Melena: Course was complicated by new onset melena. ASA, plavix, and lovenox was discontinued due to bleed with resolution of the bleed. At time of discharge, he is able to tolerate aspirin without overt bleeding. He will require outpatient follow up for routine screening and melena follow up.  4. Hematuria and urinary retention: hospital course was also complicated by urinary retention for which a foley catheter was placed. Due to numerous failed voiding trials and new onset hematuria (believed to be trauma related), urology was consulted and recommended leaving urinary catheter in place until he follows up outpatient. Hematuria resolved prior to discharge and will need a urology appointment for further evaluation and management.  5. Depression: Over the course of his hospitalization, Philip Vasquez continued to struggle with depression which was impacting his ability to work with PT or be conversational. This was most likely  situational and there is a possibility that his stroke also contributed.  He responded well to prozac and remeron and became more active with therapy by time of discharge.  Discharge Vitals:   BP (!) 133/92 (BP Location: Left Arm)   Pulse 66   Temp 99.1 F (37.3 C) (Oral)   Resp 18   Ht 5\' 9"  (1.753 m)   Wt 123 kg   SpO2 97%   BMI 40.04 kg/m   Pertinent Labs, Studies, and Procedures:   Echocardiogram: IMPRESSIONS:  1. The left ventricle has mildly reduced systolic function, with an ejection fraction of 45-50%. The cavity size was normal. There is mild concentric left ventricular hypertrophy. Left ventricular diastolic Doppler parameters are consistent with  impaired relaxation.  2. The right ventricle has normal systolic function. The cavity was normal. There is no increase in right ventricular wall thickness.  3. The pericardial effusion is circumferential.  4. Trivial pericardial effusion is present.  5. The mitral valve is myxomatous. Moderate thickening of the mitral  valve leaflet.  6. The tricuspid valve is grossly normal.  7. The aortic valve is tricuspid. Mild thickening of the aortic valve. Moderate calcification of the aortic valve. No stenosis of the aortic valve.  8. The aorta is normal unless otherwise noted.  9. The aortic root is normal in size and structure. 10. There is evidence of plaque in the aortic root. 11. Aortic root plaque/sclerosis noted. There is focal calcification noted in the aortic root. 12. The inferior vena cava was normal in size with <50% respiratory variability. 13. When compared to the prior study: No comparison.  CT Angio Head & Neck: IMPRESSION: 1. Mild atherosclerotic disease at the carotid bifurcation regions but no stenosis or pronounced irregularity.   2. 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  MRI Brain: IMPRESSION: 1. Widely scattered small acute infarcts in the medial  left hemisphere involving left ACA and left ACA/MCA watershed area. No associated hemorrhage or mass effect. 2. 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  CBC Latest Ref Rng & Units 05/21/2019 05/06/2019 04/23/2019  WBC 4.0 - 10.5 K/uL 5.7 5.0 4.9  Hemoglobin 13.0 - 17.0 g/dL 12.6(L) 11.5(L) 11.1(L)  Hematocrit 39.0 - 52.0 % 40.2 36.9(L) 35.5(L)  Platelets 150 - 400 K/uL 242 248 208   BMP Latest Ref Rng & Units 06/08/2019 06/06/2019 05/28/2019  Glucose 70 - 99 mg/dL 104(H) 101(H) 132(H)  BUN 6 - 20 mg/dL 25(H) 32(H) 24(H)  Creatinine 0.61 - 1.24 mg/dL 1.54(H) 1.71(H) 1.37(H)  Sodium 135 - 145 mmol/L 139 138 138  Potassium 3.5 - 5.1 mmol/L 4.1 4.7 4.3  Chloride 98 - 111 mmol/L 104 104 105  CO2 22 - 32 mmol/L 26 24 24   Calcium 8.9 - 10.3 mg/dL 9.8 9.6 9.3     Discharge Instructions: Discharge Instructions     Discharge instructions   Complete by: As directed    Follow up with neurology. Follow up with urology. Follow up with podiatry.       Signed: Mitzi Hansen, MD 06/14/2019, 12:29 PM   Pager: [phone]

## 2019-01-06 NOTE — Consult Note (Signed)
[facility] Consultation Note  Referring Provider: Internal Medicine Teaching Service Primary Care Physician:  Patient, No Pcp Per  Reason for Consultation:  Anemia, hemoccult positive stool  HPI: Philip Vasquez is a 58 y.o. male admitted for acute stroke in setting of cocaine use.  He has right hemiparesis.  Has had some drop in hemoglobin, hemoccult-positive, possible solitary episode of melena couple days ago.  Denies abdominal pain, hematochezia, hematemesis.  Denies prior endoscopy or colonoscopy.   History reviewed. No pertinent past medical history.  History reviewed. No pertinent surgical history.  Prior to Admission medications   Not on File    Current Facility-Administered Medications  Medication Dose Route Frequency Provider Last Rate Last Dose  . 0.9 %  sodium chloride infusion   Intravenous Continuous Kirt Boys, MD 50 mL/hr at 01/05/19 2138    . acetaminophen (TYLENOL) tablet 650 mg  650 mg Oral Q6H PRN Lanelle Bal, MD   650 mg at 12/23/18 2104   Or  . acetaminophen (TYLENOL) suppository 650 mg  650 mg Rectal Q6H PRN Lanelle Bal, MD      . amLODipine (NORVASC) tablet 10 mg  10 mg Oral Daily Kirt Boys, MD   10 mg at 01/05/19 1613  . baclofen (LIORESAL) tablet 5 mg  5 mg Oral TID Katherine Roan, MD   5 mg at 01/06/19 6295  . bisacodyl (DULCOLAX) suppository 10 mg  10 mg Rectal Daily PRN Earl Lagos, MD   10 mg at 01/03/19 1707  . feeding supplement (ENSURE ENLIVE) (ENSURE ENLIVE) liquid 237 mL  237 mL Oral BID BM Tyson Alias, MD   237 mL at 01/05/19 1353  . folic acid (FOLVITE) tablet 1 mg  1 mg Oral Daily Albertha Ghee, MD   1 mg at 01/06/19 0941  . hydrALAZINE (APRESOLINE) tablet 75 mg  75 mg Oral TID Lanelle Bal, MD   75 mg at 01/06/19 0958  . labetalol (NORMODYNE) tablet 100 mg  100 mg Oral TID PRN Kirt Boys, MD   100 mg at 12/22/18 1414  . losartan (COZAAR) tablet 25 mg  25 mg Oral Daily  Kirt Boys, MD   25 mg at 01/06/19 0941  . MEDLINE mouth rinse  15 mL Mouth Rinse BID Earl Lagos, MD   15 mL at 01/06/19 0959  . metoprolol tartrate (LOPRESSOR) tablet 25 mg  25 mg Oral BID Kirt Boys, MD   25 mg at 01/06/19 0941  . multivitamin with minerals tablet 1 tablet  1 tablet Oral Daily Albertha Ghee, MD   1 tablet at 01/06/19 0941  . pantoprazole (PROTONIX) EC tablet 40 mg  40 mg Oral BID Lanelle Bal, MD   40 mg at 01/06/19 0941  . polyethylene glycol (MIRALAX / GLYCOLAX) packet 17 g  17 g Oral BID Katherine Roan, MD   17 g at 01/06/19 0959  . rosuvastatin (CRESTOR) tablet 40 mg  40 mg Oral q1800 Kirt Boys, MD   40 mg at 01/05/19 1743  . senna-docusate (Senokot-S) tablet 2 tablet  2 tablet Oral QHS Earl Lagos, MD   2 tablet at 01/05/19 2134  . thiamine (VITAMIN B-1) tablet 100 mg  100 mg Oral Daily Albertha Ghee, MD   100 mg at 01/06/19 0941   Or  . thiamine (B-1) injection 100 mg  100 mg Intravenous Daily Albertha Ghee, MD   100 mg at 01/03/19 1013    Allergies as of 12/19/2018  . (No Known Allergies)    No family history  on file.  Social History   Socioeconomic History  . Marital status: Single    Spouse name: Not on file  . Number of children: Not on file  . Years of education: Not on file  . Highest education level: Not on file  Occupational History  . Not on file  Social Needs  . Financial resource strain: Not on file  . Food insecurity    Worry: Not on file    Inability: Not on file  . Transportation needs    Medical: Not on file    Non-medical: Not on file  Tobacco Use  . Smoking status: Never Smoker  . Smokeless tobacco: Never Used  Substance and Sexual Activity  . Alcohol use: Yes    Comment: 2 cans of beer (natural lite)/day  . Drug use: Never  . Sexual activity: Not on file  Lifestyle  . Physical activity    Days per week: Not on file    Minutes per session: Not on file  . Stress: Not on file   Relationships  . Social Musicianconnections    Talks on phone: Not on file    Gets together: Not on file    Attends religious service: Not on file    Active member of club or organization: Not on file    Attends meetings of clubs or organizations: Not on file    Relationship status: Not on file  . Intimate partner violence    Fear of current or ex partner: Not on file    Emotionally abused: Not on file    Physically abused: Not on file    Forced sexual activity: Not on file  Other Topics Concern  . Not on file  Social History Narrative  . Not on file    Review of Systems: As per HPI, all others negative  Physical Exam: Vital signs in last 24 hours: Temp:  [98.3 F (36.8 C)-99.2 F (37.3 C)] 98.8 F (37.1 C) (09/30 1201) Pulse Rate:  [70-92] 70 (09/30 1201) Resp:  [16-18] 16 (09/30 1201) BP: (116-143)/(69-85) 132/70 (09/30 1201) SpO2:  [98 %-100 %] 98 % (09/30 1201) Weight:  [101.3 kg] 101.3 kg (09/30 0323) Last BM Date: 01/04/19 General:   Alert, no acute distress Head:  Normocephalic and atraumatic. Eyes:  Sclera clear, no icterus.   Conjunctiva pink. Ears:  Normal auditory acuity. Nose:  No deformity, discharge,  or lesions. Mouth:  No deformity or lesions.  Oropharynx pink & moist. Neck:  Supple; no masses or thyromegaly. Lungs:  Clear throughout to auscultation.   No wheezes, crackles, or rhonchi. No acute distress. Heart:  Regular rate and rhythm; no murmurs, clicks, rubs,  or gallops. Abdomen:  Soft, non-tender, mild protuberant. No masses, hepatosplenomegaly or hernias noted. Normal bowel sounds, without guarding, and without rebound.     Msk:  Symmetrical without gross deformities. Normal posture. Pulses:  Normal pulses noted. Extremities:  Without clubbing or edema. Neurologic:  Alert and  Oriented; right hemiparesis Skin:  Intact without significant lesions or rashes. Psych:  Alert and cooperative. Normal mood and affect.   Lab Results: Recent Labs     01/05/19 0410 01/05/19 1334 01/06/19 0418  WBC 8.0 7.2 8.2  HGB 11.9* 11.1* 10.8*  HCT 35.3* 34.3* 33.2*  PLT 243 263 242   BMET Recent Labs    01/04/19 0417 01/06/19 0418  NA 138 137  K 4.2 3.9  CL 111 110  CO2 20* 20*  GLUCOSE 122* 109*  BUN 33* 27*  CREATININE 1.31* 1.36*  CALCIUM 8.9 8.4*   LFT No results for input(s): PROT, ALBUMIN, AST, ALT, ALKPHOS, BILITOT, BILIDIR, IBILI in the last 72 hours. PT/INR No results for input(s): LABPROT, INR in the last 72 hours.  Studies/Results: No results found.  Impression:  1.  Hemoccult positive stool. 2.  Report of melena x 1 couple days ago per patient. 3.  Acute stroke with right-hemiparesis. 4.  Cocaine abuse.  Plan:  1.  Would not pursue endoscopic evaluation of purported melena, as well as his documented heme-positive stool and mild interval drop in Hgb, in absence of overt exsanguinating destabilizing bleeding, in light of very recent acute stroke and anticoagulant medication. 2.  PPI bid (e.g., pantoprazole 40 mg po bid) x 6 weeks, then 40 mg po qd thereafter indefinitely. 3.  Upon hospital discharge, patient can follow-up with me (Dr. Paulita Fujita, [facility], [phone]) in the next couple months (sooner if Hgb continues to drop). 4.  [facility] will sign-off; please call with questions; thank you for the consultation.   LOS: 18 days   Ruthie Berch M  01/06/2019, 1:21 PM  Cell [phone] If no answer or after 5 PM call [phone]

## 2019-01-06 NOTE — Progress Notes (Signed)
   Subjective: Patient was seen at the bedside on morning rounds today.  The patient says he had ice cream and a protein shake for breakfast.  Says he did not order this but this is what he gets every morning.  Patient states he wants to go home.  We discussed how his blood counts are decreasing and we want to get GI to come take a look at him.  Patient voices understanding.  All questions were answered.  Of note, I had a discussion with the nurse about helping Mr. Schwarz order his food and make sure someone is there to help him feed because he will not be able to do it himself.  Voiced understanding.  Objective:  Vital signs in last 24 hours: Vitals:   01/05/19 1944 01/05/19 2300 01/06/19 0323 01/06/19 0700  BP: 116/70 120/85 122/81 120/80  Pulse: 79 70 77 74  Resp: 18 18 18 18   Temp: 99.2 F (37.3 C) 98.9 F (37.2 C) 98.5 F (36.9 C) 98.4 F (36.9 C)  TempSrc: Oral Oral Oral Oral  SpO2: 98% 99% 98% 100%  Weight:   101.3 kg   Height:       Physical Exam: General: Resting in bed, NAD HEENT: NCAT CV: RRR, normal S1 & S2, no murmurs rubs or gallops appreciated PULM: CTAB ABD: soft, nontender NEURO: diffuse R sided weakness  Assessment/Plan:  Principal Problem:   Cerebral thrombosis with cerebral infarction Active Problems:   Cocaine use disorder (HCC)   Essential hypertension   Hyperlipidemia  In summary Mr. Dente is a 58 year old gentleman who uses cocaine daily with no recorded past medical history who presented  left ACA/MCA watershed infarcts in the setting of cocaine use. His current deficits are right-sided weakness and dysarthria.  Course was complicated by new onset melena and positive FOBT.  Patient is hemodynamically stable but hemoglobin is steadily trending downward.  #Melena: Reportedly had large black bowel movement 2 days ago. FOBT positive. Hgb 10.8 today from 14.7 (12 days ago).  - Consulted GI, will appreciate recommendations - D/c aspirin and Plavix  (original stop date October 5) - Protonix 40 mg twice daily  #Dysarthria #R sided weakness #L ACA/MCA Infarcts: MRI of the head showed widely scattered small acute infarcts in the medial left hemisphere involving left ACA and left ACA/MCA watershed area in addition to chronic small vessel disease. Neurology was consulted and has signed off. - Continue rosuvastatin 40 mg daily  - Hemoglobin A1c 6.2%, elevated but no need for metformin at this time. - PT/OT/SLP consulted. Recommend CIR or SNF - Follow-up with neurology in 6 weeks in the outpatient setting  #HTN: Out of the permissive hypertension window. BP 120/80 this AM. - Continue losartan 25 mg daily  - Continue metoprolol 25 mg BID - Continue amlodipine 10 mg daily - Continue Hydrlazine 50 mg TID - Continue labetalol 100 mg TID PRN   #AKI #CKD?: Cr on admission 1.90, GFR 40. No baseline available. Creatinine 1.36 today - Daily BMP  #FEN/GI - Diet: Regular with thin liquids, Ensure feeding supplement. - IVF: none  #DVT prophylaxis - Lovenox 40 mg subq injections  #Dispo: Unknown. Patient not a candidate for CIR or SNF due to uninsured status. Working with [place].  [name], MD Internal Medicine, PGY1 Pager: [phone]  01/06/2019,11:54 AM

## 2019-01-07 LAB — CBC WITH DIFFERENTIAL/PLATELET
Abs Immature Granulocytes: 0.04 10*3/uL (ref 0.00–0.07)
Basophils Absolute: 0 10*3/uL (ref 0.0–0.1)
Basophils Relative: 0 %
Eosinophils Absolute: 0.2 10*3/uL (ref 0.0–0.5)
Eosinophils Relative: 2 %
HCT: 31.6 % — ABNORMAL LOW (ref 39.0–52.0)
Hemoglobin: 10.6 g/dL — ABNORMAL LOW (ref 13.0–17.0)
Immature Granulocytes: 0 %
Lymphocytes Relative: 16 %
Lymphs Abs: 1.4 10*3/uL (ref 0.7–4.0)
MCH: 30.2 pg (ref 26.0–34.0)
MCHC: 33.5 g/dL (ref 30.0–36.0)
MCV: 90 fL (ref 80.0–100.0)
Monocytes Absolute: 0.6 10*3/uL (ref 0.1–1.0)
Monocytes Relative: 7 %
Neutro Abs: 6.6 10*3/uL (ref 1.7–7.7)
Neutrophils Relative %: 75 %
Platelets: 227 10*3/uL (ref 150–400)
RBC: 3.51 MIL/uL — ABNORMAL LOW (ref 4.22–5.81)
RDW: 12.4 % (ref 11.5–15.5)
WBC: 8.9 10*3/uL (ref 4.0–10.5)
nRBC: 0 % (ref 0.0–0.2)

## 2019-01-07 LAB — CBC
HCT: 31.7 % — ABNORMAL LOW (ref 39.0–52.0)
Hemoglobin: 10.9 g/dL — ABNORMAL LOW (ref 13.0–17.0)
MCH: 31.2 pg (ref 26.0–34.0)
MCHC: 34.4 g/dL (ref 30.0–36.0)
MCV: 90.8 fL (ref 80.0–100.0)
Platelets: 254 10*3/uL (ref 150–400)
RBC: 3.49 MIL/uL — ABNORMAL LOW (ref 4.22–5.81)
RDW: 12.3 % (ref 11.5–15.5)
WBC: 10.7 10*3/uL — ABNORMAL HIGH (ref 4.0–10.5)
nRBC: 0 % (ref 0.0–0.2)

## 2019-01-07 LAB — LACTATE DEHYDROGENASE: LDH: 153 U/L (ref 98–192)

## 2019-01-07 NOTE — Progress Notes (Signed)
  Speech Language Pathology Treatment: Cognitive-Linquistic  Patient Details Name: Philip Vasquez MRN: [id number] DOB: 23-Sep-1960 Today's Date: 01/07/2019 Time: 6222-9798 SLP Time Calculation (min) (ACUTE ONLY): 20 min  Assessment / Plan / Recommendation Clinical Impression  Pt was seen for cognitive-linguistic treatment and was cooperative throughout the session. He participated in a slightly longer session as he agreed to when he was last seen but it was terminated to facilitate MD's assessment of the pt. He required mod-max cues to provide items needed to make pancakes (which he stated he made frequently). He was able to provide two ingredients but then continued to state items which he would eat with pancakes such as orange juice. He provided 8 items during divergent naming when mod cues were given. He completed responsive naming with 80% accuracy increasing to 100% with min cues. He achieved 20% accuracy with recall of concrete information from voicemail recordings increasing to 80% with choice cues. He completed money calculations with 60% accuracy increasing to 80% with min cues. SLP will continue to follow pt.    HPI HPI: Pt is a 58 year old gentleman with no past medical history who presented with AMS and right-sided weakness. MRI: Widely scattered small acute infarcts in the medial lefthemisphere involving left ACA and left ACA/MCA watershed area. UA positive for cocaine use.       SLP Plan  Continue with current plan of care       Recommendations                   Oral Care Recommendations: Oral care BID Follow up Recommendations: 24 hour supervision/assistance;Inpatient Rehab SLP Visit Diagnosis: Aphasia (R47.01);Cognitive communication deficit (R41.841) Plan: Continue with current plan of care       Philip Vasquez, [place], [place] Office number [phone] Pager [place] 01/07/2019, 11:21  AM

## 2019-01-07 NOTE — Progress Notes (Signed)
Occupational Therapy Treatment Patient Details Name: Philip Vasquez MRN: [id number] DOB: [birth year] Today's Date: 01/07/2019    History of present illness Philip Vasquez is a 58 year old gentleman with no past medical history who presented with AMS and right-sided weakness. MRI: Widely scattered small acute infarcts in the medial lefthemisphere involving left ACA and left ACA/MCA watershed area.   OT comments  Pt seen for facilitation of Rt UE movement - he is able to activate isolated elbow flexion/extension with max encouragement.  He refused all other activity.   Follow Up Recommendations  SNF;Supervision/Assistance - 24 hour    Equipment Recommendations  Wheelchair (measurements OT);Wheelchair cushion (measurements OT)    Recommendations for Other Services      Precautions / Restrictions Precautions Precautions: Fall       Mobility Bed Mobility                  Transfers                 General transfer comment: Pt adamantly refused OOB activity today     Balance                                           ADL either performed or assessed with clinical judgement   ADL                                               Vision       Perception     Praxis      Cognition Arousal/Alertness: Awake/alert Behavior During Therapy: Flat affect Overall Cognitive Status: Difficult to assess                     Current Attention Level: Sustained   Following Commands: Follows one step commands consistently                Exercises Exercises: Other exercises Other Exercises Other Exercises: Pt performed 10 reps AAROM/facilitation of elbow flex/ext.  He refused further activity after that    Shoulder Instructions       General Comments      Pertinent Vitals/ Pain       Pain Assessment: No/denies pain  Home Living                                          Prior Functioning/Environment               Frequency  Min 2X/week        Progress Toward Goals  OT Goals(current goals can now be found in the care plan section)  Progress towards OT goals: Not progressing toward goals - comment     Plan Discharge plan remains appropriate    Co-evaluation                 AM-PAC OT "6 Clicks" Daily Activity     Outcome Measure   Help from another person eating meals?: A Lot Help from another person taking care of personal grooming?: A Lot Help from another person toileting, which includes using toliet, bedpan, or urinal?: Total Help from another person bathing (including washing, rinsing,  drying)?: Total Help from another person to put on and taking off regular upper body clothing?: A Lot Help from another person to put on and taking off regular lower body clothing?: Total 6 Click Score: 9    End of Session    OT Visit Diagnosis: Other abnormalities of gait and mobility (R26.89);Muscle weakness (generalized) (M62.81);Low vision, both eyes (H54.2);Other symptoms and signs involving cognitive function;Cognitive communication deficit (R41.841);Hemiplegia and hemiparesis Symptoms and signs involving cognitive functions: Cerebral infarction Hemiplegia - Right/Left: Right Hemiplegia - dominant/non-dominant: Dominant Hemiplegia - caused by: Cerebral infarction   Activity Tolerance Other (comment)(Pt refused OOB )   Patient Left in bed;with call bell/phone within reach;with bed alarm set   Nurse Communication          Time: 8119-1478 OT Time Calculation (min): 13 min  Charges: OT General Charges $OT Visit: 1 Visit OT Treatments $Neuromuscular Re-education: 8-22 mins  Lucille Passy, OTR/L [place] Pager [phone] Office [phone]    Lucille Passy M 01/07/2019, 6:41 PM

## 2019-01-07 NOTE — Progress Notes (Signed)
PT Cancellation Note  Patient Details Name: Philip Vasquez MRN: [id number] DOB: 04-30-1960   Cancelled Treatment:    Reason Eval/Treat Not Completed: Patient declined, no reason specified Attempted to see pt for PT treatment. Pt initially agreeable to participate in therapy and then rolled onto R side and then refused to participate. Unable to determine the reason pt does not want to participate. Pt reports he will participate in therapy tomorrow. PT will continue to follow acutely.    Earney Navy, PTA Acute Rehabilitation Services Pager: [phone] Office: [phone]   01/07/2019, 3:18 PM

## 2019-01-07 NOTE — Progress Notes (Signed)
Chaplain visited patient after referral from the Nurse during progression rounds.  The patient was polite and respectful but indicated they did not want a visit from the chaplain.  The chaplain will not follow-up with this patient.  Brion Aliment Chaplain Resident For questions concerning this note please contact me by pager [phone] Resident For questions concerning this note please contact me by pager [phone]

## 2019-01-07 NOTE — Progress Notes (Signed)
   Subjective: patient confused this morning. Had large dark stool while in room.   Objective:  Vital signs in last 24 hours: Vitals:   01/06/19 1609 01/06/19 1945 01/06/19 2324 01/07/19 0343  BP:  129/79 132/71 132/70  Pulse: 87 90 75 88  Resp:  18 18 18   Temp: 99.7 F (37.6 C) 99.4 F (37.4 C) 99 F (37.2 C) 98.7 F (37.1 C)  TempSrc: Oral Oral Oral Oral  SpO2: 96% 100% 100% 100%  Weight:    101.6 kg  Height:       Physical Exam: General: Resting in bed, NAD HEENT: NCAT CV: RRR PULM: non tachypnic. ABD: soft, nontender NEURO: diffuse R sided weakness. Clearly confused this morning. Mumbling a repeated phrase which I could not understand.  Assessment/Plan:  Principal Problem:   Cerebral thrombosis with cerebral infarction Active Problems:   Cocaine use disorder (HCC)   Essential hypertension   Hyperlipidemia  In summary Philip Vasquez is a 58 year old gentleman who uses cocaine daily with no recorded past medical history who presented  left ACA/MCA watershed infarcts. His current deficits are right-sided weakness and dysarthria. The patient is currently medically stable and has been optimized for discharge. However, his disposition is unknown because he is not a candidate for CIR or SNF placement due to lack of insurance. He is homeless so home health is not an option.  #Melena: etiology unclear. Anticoagulant vs ischemic bowel vs ulcer.  hgb trending down without significant drop. Pressures stable. FOBT positive. Another large dark stool evident on PE.  -GI consulted. Suggested that bleed likely from anticoag and no need for colonoscopy at this time. Of note, pt has never had a colonoscopy. -hold plavix and Asprin -PPI BID -will need to reach out to GI if hgb continues to drop -will need to be set up for colonoscopy and GI appt upon d/c   #Dysarthria #R sided weakness #L ACA/MCA Infarcts: MRI of the head showed widely scattered small acute infarcts in the medial left  hemisphere involving left ACA and left ACA/MCA watershed area in addition to chronic small vessel disease. Neurology was consulted and has signed off. - Continue rosuvastatin 40 mg daily  - Continue aspirin 81 mg daily and Plavix 75 mg daily for 3 weeks (stop date October 5) followed by aspirin 81 mg alone - Hemoglobin A1c 6.2%, elevated but no need for metformin at this time. - PT/OT/SLP consulted. Recommend CIR or SNF - Follow-up with neurology in 6 weeks in the outpatient setting  #HTN: Out of the permissive hypertension window. BP 135/97 this AM. - Losartan 25 mg daily today  - Continue metoprolol 25 mg BID - Continue amlodipine 10 mg daily - Continue Hydrlazine 50 mg TID - Continue labetalol 100 mg TID PRN   #AKI #CKD?: Cr on admission 1.90, GFR 40. No baseline available. Creatinine 1.31 today - Every other day BMP  #FEN/GI - Diet: Regular with thin liquids, Ensure feeding supplement - IVF: none  #DVT prophylaxis - hold for active bleeding  #Dispo: Unknown. Patient not a candidate for CIR or SNF due to uninsured status. Working with [place].  Mitzi Hansen, MD 01/07/19 12:34 PM

## 2019-01-08 LAB — CBC
HCT: 32.4 % — ABNORMAL LOW (ref 39.0–52.0)
Hemoglobin: 10.6 g/dL — ABNORMAL LOW (ref 13.0–17.0)
MCH: 29.6 pg (ref 26.0–34.0)
MCHC: 32.7 g/dL (ref 30.0–36.0)
MCV: 90.5 fL (ref 80.0–100.0)
Platelets: 249 10*3/uL (ref 150–400)
RBC: 3.58 MIL/uL — ABNORMAL LOW (ref 4.22–5.81)
RDW: 12.3 % (ref 11.5–15.5)
WBC: 8.3 10*3/uL (ref 4.0–10.5)
nRBC: 0 % (ref 0.0–0.2)

## 2019-01-08 NOTE — Plan of Care (Signed)
Patient stable, discussed POC with patient's brother, agreeable with plan, denies question/concerns at this time.

## 2019-01-08 NOTE — Progress Notes (Signed)
The chaplain attempted to make contact with the patient to assess if there are any spiritual needs per the request of the nursing staff.  The patient was still unreceptive to a chaplain visit.  The chaplain will not follow-up unless specifically requested by the patient.  Brion Aliment Chaplain Resident For questions concerning this note please contact me by pager [phone]

## 2019-01-08 NOTE — Progress Notes (Signed)
Subjective: pt awake but non talking this morning.   Objective:  Vital signs in last 24 hours: Vitals:   01/07/19 2023 01/08/19 0022 01/08/19 0108 01/08/19 0349  BP: (!) 133/95 139/83  140/77  Pulse: 97 73  78  Resp: 19 17  18   Temp: 98.7 F (37.1 C) 98.5 F (36.9 C)  98.4 F (36.9 C)  TempSrc: Oral Oral  Oral  SpO2: 98% 100%  96%  Weight:   101.4 kg   Height:       Physical Exam: General: Resting in bed, NAD HEENT: NCAT CV: RRR PULM: non tachypnic. ABD: soft, nontender NEURO: diffuse R sided weakness. Less confused this morning but did not seem to want to talk.  Assessment/Plan:  Principal Problem:   Cerebral thrombosis with cerebral infarction Active Problems:   Cocaine use disorder (HCC)   Essential hypertension   Hyperlipidemia  In summary Mr. Schubring is a 58 year old gentleman who uses cocaine daily with no recorded past medical history who presented  left ACA/MCA watershed infarcts. His current deficits are right-sided weakness and dysarthria. The patient is currently medically stable and has been optimized for discharge. However, his disposition is unknown because he is not a candidate for CIR or SNF placement due to lack of insurance. He is homeless so home health is not an option.  #Melena: etiology unclear. Anticoagulant vs ischemic bowel vs ulcer.  hgb trending down without significant drop. Pressures stable. FOBT positive. Another large dark stool evident on PE.  -GI consulted 9/30. Suggested that bleed likely from anticoag and no need for colonoscopy at this time.Signed off. Of note, pt has never had a colonoscopy. Attempted to contact GI again 10/1 for concerns regarding continued melena however was unable to reach them. Will continue to try to contact. -discussed with brother yesterday. He confirms no prior issues with GI bleeding.  -Hgb stable Plan: -hold plavix and Asprin -PPI BID -will need to be set up for colonoscopy and GI appt upon d/c    #Dysarthria #R sided weakness #L ACA/MCA Infarcts: MRI of the head showed widely scattered small acute infarcts in the medial left hemisphere involving left ACA and left ACA/MCA watershed area in addition to chronic small vessel disease. Neurology was consulted and has signed off. -Discussed with nursing 10/1. Patient has been  Having some behavior issues including tossing his food and drinks across the room. When asked why he is doing this, RN notes that he just shrugs. Possible relation to stroke? Plan - Continue rosuvastatin 40 mg daily  - asprin/plavix held due to melena/active bleeding - PT/OT/SLP consulted. Recommend CIR or SNF - Follow-up with neurology in 6 weeks in the outpatient setting  #HTN: Out of the permissive hypertension window. BP 135/97 this AM. - Losartan 25 mg daily today  - Continue metoprolol 25 mg BID - Continue amlodipine 10 mg daily - Continue Hydrlazine 50 mg TID - Continue labetalol 100 mg TID PRN   #AKIvs CKD?: Cr on admission 1.90, GFR 40. No baseline available. Renal function stable  #DVT prophylaxis - hold for active bleeding  #Dispo: Unknown. Patient not a candidate for CIR or SNF due to uninsured status. Discussed plan with pt's brother. Discussed how he will likely need placement for rehabilitation. Brother notes that he would not want pt to go to SNF as his family has had issues with getting therapy there. Would be ok with rehab facility. Discussed insurance barriers. Brother notes that he sent in the medicaid forms about a week  ago. He had spoke to [place] from [facility] yesterday and she had not received the forms yet. Encouraged him to reach out to her again on Monday if he has not heard back yet. Brother was also concerned that patient has not been receiving enough therapy here. Discussed previous notes that have implied that pt has refused therapy.  Other possible barrier to receiving bed placement will be +UDS for cocaine on admission.  Elige Radon, MD  01/08/19 5:42 AM

## 2019-01-08 NOTE — TOC Progression Note (Signed)
Transition of Care [facility]) - Progression Note    Patient Details  Name: Philip Vasquez MRN: [id number] Date of Birth: [birth year]  [facility] CM/SW [place], [place] Phone Number: 01/08/2019, 5:03 PM  Clinical Narrative:  CSW spoke with MD about barriers to discharge. Patient continues to have no bed offers due to no payer source and his extensive drug history, there are no SNFs that have offered a bed for him at this time. CSW to continue to follow.    Expected Discharge Plan: IP Rehab Facility Barriers to Discharge: SNF Pending payor source - LOG, SNF Pending bed offer, Inadequate or no insurance, Active Substance Use - Placement  Expected Discharge Plan and Services Expected Discharge Plan: [place] In-house Referral: Clinical Social Work Discharge Planning Services: CM Consult                                           Social Determinants of Health (SDOH) Interventions    Readmission Risk Interventions No flowsheet data found.

## 2019-01-08 NOTE — Progress Notes (Signed)
PT Cancellation Note  Patient Details Name: Philip Vasquez MRN: [id number] DOB: February 01, 1961   Cancelled Treatment:    Reason Eval/Treat Not Completed: Patient declined, no reason specified patient continues to refuse therapy, rolled onto right side and refuses participation, stated "I don't want to, leave me alone!". RN aware.    Deniece Ree PT, DPT, CBIS  Supplemental Physical Therapist [facility]    Pager [phone] Acute Rehab Office [phone]

## 2019-01-08 NOTE — Progress Notes (Signed)
SLP Cancellation Note  Patient Details Name: Wilfrido Luedke MRN: [id number] DOB: 07-31-1960   Cancelled treatment:       Reason Eval/Treat Not Completed: Patient declined, no reason specified;  Attempted to engage pt in skilled ST.  Pt did not respond to therapist other than to grunt unclear answers to her questions.  Pt indicated that he may be in pain but could not specify where or what type of pain.  He then proceeded to tell therapist "Leave me alone."  As a result, efforts were abandoned.  ST will continue to follow along as pt is willing to participate.     Murat Rideout, Selinda [place] 01/08/2019, 11:21 AM

## 2019-01-09 LAB — CBC
HCT: 31.1 % — ABNORMAL LOW (ref 39.0–52.0)
Hemoglobin: 10.2 g/dL — ABNORMAL LOW (ref 13.0–17.0)
MCH: 29.9 pg (ref 26.0–34.0)
MCHC: 32.8 g/dL (ref 30.0–36.0)
MCV: 91.2 fL (ref 80.0–100.0)
Platelets: 267 10*3/uL (ref 150–400)
RBC: 3.41 MIL/uL — ABNORMAL LOW (ref 4.22–5.81)
RDW: 12.4 % (ref 11.5–15.5)
WBC: 8.4 10*3/uL (ref 4.0–10.5)
nRBC: 0 % (ref 0.0–0.2)

## 2019-01-09 LAB — BASIC METABOLIC PANEL
Anion gap: 8 (ref 5–15)
BUN: 14 mg/dL (ref 6–20)
CO2: 21 mmol/L — ABNORMAL LOW (ref 22–32)
Calcium: 8.5 mg/dL — ABNORMAL LOW (ref 8.9–10.3)
Chloride: 109 mmol/L (ref 98–111)
Creatinine, Ser: 1.26 mg/dL — ABNORMAL HIGH (ref 0.61–1.24)
GFR calc Af Amer: >60 mL/min (ref >60)
GFR calc non Af Amer: >60 mL/min (ref >60)
Glucose, Bld: 106 mg/dL — ABNORMAL HIGH (ref 70–99)
Potassium: 3.7 mmol/L (ref 3.5–5.1)
Sodium: 138 mmol/L (ref 135–145)

## 2019-01-09 MED ORDER — CLOPIDOGREL BISULFATE 75 MG PO TABS
75.0000 mg | ORAL_TABLET | Freq: Every day | ORAL | Status: DC
  Administered 2019-01-09 – 2019-01-18 (×10): 75 mg via ORAL
  Filled 2019-01-09 (×9): qty 1

## 2019-01-09 MED ORDER — ASPIRIN EC 81 MG PO TBEC
81.0000 mg | DELAYED_RELEASE_TABLET | Freq: Every day | ORAL | Status: DC
  Administered 2019-01-09 – 2019-06-14 (×157): 81 mg via ORAL
  Filled 2019-01-09 (×156): qty 1

## 2019-01-09 NOTE — Progress Notes (Signed)
  Date: 01/09/2019  Patient name: Philip Vasquez  Medical record number: [id number]  Date of birth: 05-13-1960        I have seen and evaluated this patient and I have discussed the plan of care with the house staff. Please see their note for complete details. I concur with their findings with the following additions/corrections: Mr. Hayashida was seen this morning on team rounds.  I agree with Dr. Chase Picket plan to resume aspirin Plavix while following his hemoglobin and stool for recurrence of blood.  Bartholomew Crews, MD 01/09/2019, 4:22 PM

## 2019-01-09 NOTE — Progress Notes (Signed)
Orders to trim patient's nails. Nursing is not allowed to trim patient's nails, orders removed. Nurse will continue to monitor. [place]

## 2019-01-09 NOTE — Progress Notes (Signed)
Subjective: pt awake and alert this morning. Answering questions. More talkative than previous days.  Objective:  Vital signs in last 24 hours: Vitals:   01/08/19 1142 01/08/19 1631 01/08/19 2045 01/09/19 0055  BP: (!) 159/97 140/78 (!) 162/92 (!) 136/96  Pulse: 75 86 90 60  Resp: 17 17 18 18   Temp: 98.2 F (36.8 C) 98.9 F (37.2 C) 99.1 F (37.3 C) 99.1 F (37.3 C)  TempSrc: Oral Oral Oral Oral  SpO2: 100% 98% 98% 98%  Weight:      Height:       Physical Exam: General: Resting in bed, NAD HEENT: NCAT CV: RRR PULM: non tachypnic. Lung sounds clear. ABD: soft, nontender NEURO: diffuse R sided weakness. Awake and alert. Assessment/Plan:  Principal Problem:   Cerebral thrombosis with cerebral infarction Active Problems:   Cocaine use disorder (HCC)   Essential hypertension   Hyperlipidemia  In summary Philip Vasquez is a 58 year old gentleman who uses cocaine daily with no recorded past medical history who presented  left ACA/MCA watershed infarcts. His current deficits are right-sided weakness and dysarthria. The patient is currently medically stable and has been optimized for discharge. However, his disposition is unknown because he is not a candidate for CIR or SNF placement due to lack of insurance. He is homeless so home health is not an option.  #Melena: etiology unclear. Anticoagulant vs ischemic bowel vs ulcer. FOBT positive.  -GI consulted 9/30. Suggested that bleed likely from anticoag and no need for colonoscopy at this time.Signed off. Of note, pt has never had a colonoscopy.  -Hgb stable. Pressures stable Plan: -PPI BID -restarting DAPT so will need to monitor for s/s of further melena -will need to be set up for colonoscopy and GI appt upon d/c  #Dysarthria #R sided weakness #L ACA/MCA Infarcts: MRI of the head showed widely scattered small acute infarcts in the medial left hemisphere involving left ACA and left ACA/MCA watershed area in addition to chronic  small vessel disease. Neurology was consulted and has signed off. Plan - Continue rosuvastatin 40 mg daily  - will restart asprin/plavix in light of no further melena.  - PT/OT/SLP consulted. Recommend CIR or SNF - Follow-up with neurology in 6 weeks in the outpatient setting  #HTN: Out of the permissive hypertension window. BP 135/97 this AM. - Losartan 25 mg daily today  - Continue metoprolol 25 mg BID - Continue amlodipine 10 mg daily - Continue Hydrlazine 50 mg TID - Continue labetalol 100 mg TID PRN   #AKIvs CKD?: Cr on admission 1.90, GFR 40. No baseline available. Renal function stable  #DVT prophylaxis -SCDs  #Dispo: Unknown. Patient not a candidate for CIR or SNF due to uninsured status. Discussed plan with pt's brother. Discussed how he will likely need placement for rehabilitation. Brother notes that he would not want pt to go to SNF as his family has had issues with getting therapy there. Would be ok with rehab facility. Discussed insurance barriers. Brother notes that he sent in the medicaid forms about a week ago. He had spoke to [place] from [facility] yesterday and she had not received the forms yet. Encouraged him to reach out to her again on Monday if he has not heard back yet. Brother was also concerned that patient has not been receiving enough therapy here. Discussed previous notes that have implied that pt has refused therapy. Relayed above conversation to SW--will discuss with brother next week. Other possible barrier to receiving bed placement will be +UDS  for cocaine on admission.  Philip Hansen, MD 01/09/19 6:30 AM

## 2019-01-10 NOTE — Progress Notes (Signed)
   Subjective:  No acute concerns today. He is feeling well and denies pain and enjoyed half of his breakfast.   Objective:  Vital signs in last 24 hours: Vitals:   01/09/19 1608 01/09/19 2041 01/09/19 2346 01/10/19 0436  BP: 139/65 (!) 161/95 (!) 159/98 (!) 149/99  Pulse: 92 88 74 74  Resp: 18 18 18 18   Temp: 99.5 F (37.5 C) 99 F (37.2 C) 98.6 F (37 C) 98 F (36.7 C)  TempSrc: Oral Oral Oral   SpO2: 99% 98% 97% 99%  Weight:      Height:       Constitution: NAD, supine in bed  HENT: [place]/AT Respiratory: on RA, non-labored breathing Neuro: normal affect, pleasant Skin: c/d/i    Assessment/Plan:  Principal Problem:   Cerebral thrombosis with cerebral infarction Active Problems:   Cocaine use disorder (HCC)   Essential hypertension   Hyperlipidemia   58yo male presented with right sided weakness and dysarthria, found to have ACA/MCA watershed infarcts thought to be secondary to small vessel disease. Disposition is complicated by homelessness and no insurance with difficulty of SNF placement due to previous substance use. Social work consulted and appreciate assistance. Brother has Financial controller for patient and is waiting for news.   L ACA/MCA Stroke Deficits include dysarthria and right sided weakness.   - cont. Rosuvastatin 40 mg qd - cont. Asa/plavix  - PT/OT/SLP - f/u w/neurology in six weeks   HTN Cont. Losartan 25 mg qd, Metop 25mg  bid, amlodipine 10mg  qd, hydral 75mg  tid   Melena Resolved. Thought to be 2/2 anticoagulation. GI recommends ppi 40 mg bid x6wks, then 40 mg qd with outpatient follow-up at [facility].   - cont. Pantoprazole 40 mg bid  AKI vs. CKD Stage II Renal function stable.   VTE: SCDs IVF: none  Diet: regular  Code: full   Dispo: Anticipated discharge pending placement.   Marty Heck, DO 01/10/2019, 6:53 AM Pager: [phone]

## 2019-01-11 LAB — BASIC METABOLIC PANEL
Anion gap: 11 (ref 5–15)
BUN: 13 mg/dL (ref 6–20)
CO2: 22 mmol/L (ref 22–32)
Calcium: 9.4 mg/dL (ref 8.9–10.3)
Chloride: 103 mmol/L (ref 98–111)
Creatinine, Ser: 1.23 mg/dL (ref 0.61–1.24)
GFR calc Af Amer: >60 mL/min (ref >60)
GFR calc non Af Amer: >60 mL/min (ref >60)
Glucose, Bld: 108 mg/dL — ABNORMAL HIGH (ref 70–99)
Potassium: 3.8 mmol/L (ref 3.5–5.1)
Sodium: 136 mmol/L (ref 135–145)

## 2019-01-11 LAB — CBC
HCT: 54.8 % — ABNORMAL HIGH (ref 39.0–52.0)
Hemoglobin: 18.5 g/dL — ABNORMAL HIGH (ref 13.0–17.0)
MCH: 29.4 pg (ref 26.0–34.0)
MCHC: 33.8 g/dL (ref 30.0–36.0)
MCV: 87 fL (ref 80.0–100.0)
Platelets: 249 10*3/uL (ref 150–400)
RBC: 6.3 MIL/uL — ABNORMAL HIGH (ref 4.22–5.81)
RDW: 12.3 % (ref 11.5–15.5)
WBC: 6.6 10*3/uL (ref 4.0–10.5)
nRBC: 0 % (ref 0.0–0.2)

## 2019-01-11 NOTE — Plan of Care (Signed)
  Problem: Nutrition: Goal: Adequate nutrition will be maintained Outcome: Progressing   

## 2019-01-11 NOTE — Progress Notes (Addendum)
   Subjective: pt more awake this morning. Sitting in bed watching TV. Reports continuing PT.   Objective:  Vital signs in last 24 hours: Vitals:   01/10/19 2331 01/11/19 0402 01/11/19 0833 01/11/19 1102  BP: (!) 156/66 (!) 154/92 (!) 150/89 (!) 142/88  Pulse: 79 79 72 91  Resp: 18 18 20 20   Temp: 98.6 F (37 C) 99.1 F (37.3 C) 99 F (37.2 C) 99.3 F (37.4 C)  TempSrc: Oral Oral Oral Oral  SpO2: 98% 100% 98% 95%  Weight:      Height:       Physical Exam: General: resting comfortably in bed CV: RRR PULM: lung sounds clear ABD: soft, nontender NEURO: awake and alert. diffuse R sided weakness.   Summary In summary Mr. Pons is a 58 year old gentleman who uses cocaine daily with no recorded past medical history who presented  left ACA/MCA watershed infarcts. His current deficits are right-sided weakness and dysarthria. The patient is currently medically stable and has been optimized for discharge. However, his disposition is unknown because he is not a candidate for CIR or SNF placement due to lack of insurance. He is homeless so home health is not an option.  Chronic/stable/resolved hosp problems #HTN: continue losartan, metoprolol, amlodipine, hydralizine, labetalol #AKIvs CKD?: Cr on admission 1.90, GFR 40. No baseline available. Renal function stable #Melena: Resolved.thought to be 2/2 anticoagulation. GI consulted 9/30. Suggested that bleed likely from anticoag and no need for colonoscopy at this time.Signed off. Of note, pt has never had a colonoscopy.  Plan: PPI BID. will need to be set up for colonoscopy and GI appt upon d/c  Assessment/Plan:  Principal Problem:   Cerebral thrombosis with cerebral infarction Active Problems:   Cocaine use disorder [facility]   Essential hypertension   Hyperlipidemia  #Dysarthria #R sided weakness #L ACA/MCA Infarcts:  Neurology was consulted and has signed off. Plan - continue asprin, plavix, statin - continue ST,PT, OT #HTN:  continue losartan, metoprolol, amlodipine, hydralizine, labetalol. Blood pressures slowly improving. Will continue current regimen for now but can consider going up on losartan if no further improvement.  #DVT prophylaxis. Holding due to melena and risk for rebleeding  #Dispo: discharge pending CIR or SNF placement. Medicaid app pending. Appreciate SW/CM work with this Will need follow up with neuro in 6w post d/c  Mitzi Hansen, MD 01/11/19 11:27 AM

## 2019-01-11 NOTE — Progress Notes (Signed)
Physical Therapy Treatment Patient Details Name: Betzalel Umbarger MRN: [id number] DOB: [birth year] Today's Date: 01/11/2019    History of Present Illness Mr. Gauthier is a 58 year old gentleman with no past medical history who presented with AMS and right-sided weakness. MRI: Widely scattered small acute infarcts in the medial lefthemisphere involving left ACA and left ACA/MCA watershed area.    PT Comments    Pt initially agreeable and receptive to therapy he perfomed rolling with mod to max.  As he sat up he began getting agitated and pushing backwards.  Due to cognition he remains difficult to read.  Assisted patient into standing in sara stedy frame with total +2 assistance.   Pt continues to require significant assistance at this time.  Continue to recommend long term snf placement.    Follow Up Recommendations  Supervision/Assistance - 24 hour;SNF     Equipment Recommendations  Other (comment)(defer to post acute)    Recommendations for Other Services Rehab consult     Precautions / Restrictions Precautions Precautions: Fall Precaution Comments: apneic, R UE/LE weakness    Mobility  Bed Mobility Overal bed mobility: Needs Assistance Bed Mobility: Rolling Rolling: Mod assist;Max assist Sidelying to sit: Mod assist       General bed mobility comments: Pt performed rolling to the L with max assistance and rolling to the R with moderate assistance.  He was able to follow commands for L hand placement.  To move to edge of bed he required assistance to advance LEs to edge of bed and to elevate trunk into sitting .  He was able to follow commands to push with L elbow to come to sitting.  Pt initially able to support in sitting but started pushing backward despite cues.  Transfers Overall transfer level: Needs assistance Equipment used: None;Ambulation equipment used(sara stedy) Transfers: Sit to/from Stand Sit to Stand: Total assist;+2 physical assistance         General  transfer comment: Pt pushing posterior and to the L side.  Required total assistance to come to standing and shift his weight.  Utilized stedy for pivot from bed to recliner chair.  Ambulation/Gait                 Stairs             Wheelchair Mobility    Modified Rankin (Stroke Patients Only)       Balance Overall balance assessment: Needs assistance Sitting-balance support: Single extremity supported;Bilateral upper extremity supported;Feet supported Sitting balance-Leahy Scale: Poor Sitting balance - Comments: Min-modA for sitting balance once pushing required max assistance. Postural control: Right lateral lean Standing balance support: Bilateral upper extremity supported;During functional activity Standing balance-Leahy Scale: Zero Standing balance comment: Heavy R lateral lean with posterior bias.                            Cognition Arousal/Alertness: Awake/alert Behavior During Therapy: Flat affect;Agitated(initially flat but became agitated during session.) Overall Cognitive Status: Difficult to assess Area of Impairment: Following commands                       Following Commands: Follows multi-step commands inconsistently       General Comments: "Go to hell." repeatedly.      Exercises      General Comments        Pertinent Vitals/Pain Pain Assessment: No/denies pain Pain Score: 0-No pain Faces Pain Scale: No hurt  Home Living                      Prior Function            PT Goals (current goals can now be found in the care plan section) Acute Rehab PT Goals Patient Stated Goal: pt unable to state due to expressive difficulties and lethargy PT Goal Formulation: Patient unable to participate in goal setting Time For Goal Achievement: 01/25/19 Potential to Achieve Goals: Fair Progress towards PT goals: Progressing toward goals    Frequency    Min 4X/week      PT Plan Current plan remains  appropriate    Co-evaluation              AM-PAC PT "6 Clicks" Mobility   Outcome Measure  Help needed turning from your back to your side while in a flat bed without using bedrails?: A Lot Help needed moving from lying on your back to sitting on the side of a flat bed without using bedrails?: A Lot Help needed moving to and from a bed to a chair (including a wheelchair)?: Total Help needed standing up from a chair using your arms (e.g., wheelchair or bedside chair)?: Total Help needed to walk in hospital room?: Total Help needed climbing 3-5 steps with a railing? : Total 6 Click Score: 8    End of Session Equipment Utilized During Treatment: Gait belt Activity Tolerance: Patient tolerated treatment well Patient left: with call bell/phone within reach;in chair;with chair alarm set Nurse Communication: Mobility status;Need for lift equipment PT Visit Diagnosis: Other abnormalities of gait and mobility (R26.89);Hemiplegia and hemiparesis;Muscle weakness (generalized) (M62.81) Hemiplegia - Right/Left: Right     Time: 6734-1937 PT Time Calculation (min) (ACUTE ONLY): 46 min  Charges:  $Therapeutic Activity: 23-37 mins                     Governor Rooks, PTA Acute Rehabilitation Services Pager [phone] Office [phone]     Jalayla Chrismer [name] 01/11/2019, 2:52 PM

## 2019-01-11 NOTE — Progress Notes (Signed)
Nutrition Follow-up  DOCUMENTATION CODES:   Obesity unspecified  INTERVENTION:  Continue Ensure Enlive po BID, each supplement provides 350 kcal and 20 grams of protein.  Continue Magic cup TID with meals, each supplement provides 290 kcal and 9 grams of protein  Encourage adequate PO intake.   NUTRITION DIAGNOSIS:   Inadequate oral intake related to lethargy/confusion as evidenced by estimated needs(reported meal completion); improving  GOAL:   Patient will meet greater than or equal to 90% of their needs; progressing  MONITOR:   PO intake, Supplement acceptance, Skin, Weight trends, Labs, I & O's  REASON FOR ASSESSMENT:   Malnutrition Screening Tool    ASSESSMENT:   58 year old man with hyperlipidemia, hypertension, and cocaine use disorder admitted with right upper and lower extremity weakness admitted with scattered acute infarcts in the medial left ACA and MCA watershed distribution.  Meal completion has been varied from 0-100%. Pt with 50% intake at breakfast and lunch today. Pt currently has Ensure ordered and has been consuming them. Magic up continue to be ordered with meals. RD to continue with current orders to aid in caloric and protein needs.  Labs and medications reviewed.   Diet Order:   Diet Order            Diet regular Room service appropriate? Yes with Assist; Fluid consistency: Thin  Diet effective now              EDUCATION NEEDS:   Not appropriate for education at this time  Skin:  Skin Assessment: Reviewed RN Assessment  Last BM:  10/5  Height:   Ht Readings from Last 1 Encounters:  12/19/18 5\' 9"  (1.753 m)    Weight:   Wt Readings from Last 1 Encounters:  01/08/19 101.5 kg    Ideal Body Weight:  72.7 kg  BMI:  Body mass index is 33.04 kg/m.  Estimated Nutritional Needs:   Kcal:  2000-2200  Protein:  100-115 grams  Fluid:  >/= 2 L/day    Corrin Parker, MS, RD, LDN Pager # [phone] After hours/ weekend pager #  [phone]

## 2019-01-12 LAB — CBC
HCT: 30.5 % — ABNORMAL LOW (ref 39.0–52.0)
Hemoglobin: 10.2 g/dL — ABNORMAL LOW (ref 13.0–17.0)
MCH: 29.7 pg (ref 26.0–34.0)
MCHC: 33.4 g/dL (ref 30.0–36.0)
MCV: 88.9 fL (ref 80.0–100.0)
Platelets: 281 10*3/uL (ref 150–400)
RBC: 3.43 MIL/uL — ABNORMAL LOW (ref 4.22–5.81)
RDW: 12.2 % (ref 11.5–15.5)
WBC: 7.5 10*3/uL (ref 4.0–10.5)
nRBC: 0 % (ref 0.0–0.2)

## 2019-01-12 MED ORDER — GERHARDT'S BUTT CREAM
TOPICAL_CREAM | Freq: Two times a day (BID) | CUTANEOUS | Status: DC
  Administered 2019-01-12 – 2019-01-25 (×28): via TOPICAL
  Administered 2019-01-26: 1 via TOPICAL
  Administered 2019-01-27 – 2019-02-13 (×36): via TOPICAL
  Administered 2019-02-14: 1 via TOPICAL
  Administered 2019-02-14 – 2019-02-15 (×3): via TOPICAL
  Administered 2019-02-16: 1 via TOPICAL
  Administered 2019-02-16 – 2019-02-20 (×8): via TOPICAL
  Administered 2019-02-21: 1 via TOPICAL
  Administered 2019-02-21 – 2019-03-22 (×56): via TOPICAL
  Administered 2019-05-02: 1 via TOPICAL
  Filled 2019-01-12 (×4): qty 1

## 2019-01-12 MED ORDER — PRO-STAT SUGAR FREE PO LIQD
30.0000 mL | Freq: Two times a day (BID) | ORAL | Status: DC
  Administered 2019-01-12 – 2019-06-09 (×280): 30 mL via ORAL
  Filled 2019-01-12 (×289): qty 30

## 2019-01-12 NOTE — Progress Notes (Signed)
   Subjective: resting comfortably in bed. Denies complaints.  Objective:  Vital signs in last 24 hours: Vitals:   01/11/19 2047 01/11/19 2359 01/12/19 0342 01/12/19 0357  BP: (!) 154/95 (!) 137/97 (!) 146/96   Pulse: 87 80 76   Resp: 18 18 18    Temp: 99.5 F (37.5 C) 99.7 F (37.6 C) 98.7 F (37.1 C)   TempSrc: Oral Oral Oral   SpO2: 98% 99% 99%   Weight:    103 kg  Height:       Physical Exam: General: resting comfortably in bed CV: RRR PULM: lung sounds clear NEURO: resting in bed. Opens eyes to voice. Responds by shaking head appropriately to questions. Diffuse right sided weakness. SKIN: 2 ulcers on his groin. First is 1cm lesion on the dorsal aspect of the penis approx where condom cath ends. No purulent drainage or odor from this lesion. The 2nd lesion is on the posterior aspect of the scrotum. Malodorous. serosanguinous and purulent drainage evident on bed pad. Located in the crease of the perineum. Both are tender. Summary In summary Philip Vasquez is a 58 year old gentleman who uses cocaine daily with no recorded past medical history who presented  left ACA/MCA watershed infarcts. His current deficits are right-sided weakness and dysarthria. The patient is currently medically stable and has been optimized for discharge. However, his disposition is unknown because he is not a candidate for CIR or SNF placement due to lack of insurance. He is homeless so home health is not an option.  Chronic/stable/resolved hosp problems #HTN: continue losartan, metoprolol, amlodipine, hydralizine, labetalol #AKIvs CKD?: Cr on admission 1.90, GFR 40. No baseline available. Renal function stable #Melena: Resolved.thought to be 2/2 anticoagulation. GI consulted 9/30. Suggested that bleed likely from anticoag and no need for colonoscopy at this time.Signed off. Of note, pt has never had a colonoscopy.  Plan: PPI BID. will need to be set up for colonoscopy and GI appt upon d/c #HTN: continue  losartan, metoprolol, amlodipine, hydralizine, labetalol. Blood pressures slowly improving.   Assessment/Plan:  Principal Problem:   Cerebral thrombosis with cerebral infarction Active Problems:   Cocaine use disorder ([place])   Essential hypertension   Hyperlipidemia  #Dysarthria #R sided weakness #L ACA/MCA Infarcts:  Neurology was consulted and has signed off. Plan - continue asprin, statin and plavix - PT/OT  Skin ulcers. Located on the dorsal surface of the penis and posterior scrotum. Some drainage from posterior lesion. White count stable. Afebrile. High risk for decubitus ulcers: poor mobility, condom cath, poor nutrition. Plan Wound care consult placed Frequent turns Air mattress Nutrition consult Barrier cream  #DVT prophylaxis. Holding due to melena and risk for rebleeding  #Dispo: discharge pending CIR or SNF placement. Medicaid app pending. Appreciate SW/CM work with this Will need follow up with neuro in 6w post d/c  Mitzi Hansen, MD 01/12/19 5:57 AM

## 2019-01-12 NOTE — Progress Notes (Signed)
Physical Therapy Treatment Patient Details Name: Philip Vasquez MRN: [id number] DOB: 13-Feb-1961 Today's Date: 01/12/2019    History of Present Illness Philip Vasquez is a 58 year old gentleman with no past medical history who presented with AMS and right-sided weakness. MRI: Widely scattered small acute infarcts in the medial lefthemisphere involving left ACA and left ACA/MCA watershed area.    PT Comments    Pt performed pregt activities and functional mobility.  He continues to require moderate assistance to mobilize to edge of bed and total +1 with mechanical sit to stand lift to achieve standing.  Once in frame focused on standing tolerance and B weight shifting.  He responded well to this with B platforms for support in standing.     Follow Up Recommendations  Supervision/Assistance - 24 hour;SNF     Equipment Recommendations  Other (comment)(defer to post acute.)    Recommendations for Other Services       Precautions / Restrictions Precautions Precautions: Fall Precaution Comments: apneic, R UE/LE weakness Restrictions Weight Bearing Restrictions: No    Mobility  Bed Mobility Overal bed mobility: Needs Assistance Bed Mobility: Rolling Rolling: Mod assist Sidelying to sit: Mod assist       General bed mobility comments: Pt required assistance to move RLE and roll to L side.  Once LEs off edge pf bed he attempted to work trunk into a seated position, he was unable to elevate trunk due to poor core strength.  PTA assisted pt's trunk into sitting with moderate assistance.  Once sitting edge of bed he was able to maintain static stting.  He did have one instance where he started to weight shift forward to achieve standing.  Transfers Overall transfer level: Needs assistance Equipment used: Ambulation equipment used(sara + with foot plate utilized.) Transfers: Sit to/from Stand Sit to Stand: Total assist(with sara + lift.)         General transfer comment: Once in  standing, pt able to follow commands for B weight shifting.  Pt stood for around 10 min in frame to promote weight bearing.  He stood well with support of belt and B platforms.  Ambulation/Gait Ambulation/Gait assistance: (Pre gt weight shifting performed in standing.)               Stairs             Wheelchair Mobility    Modified Rankin (Stroke Patients Only)       Balance Overall balance assessment: Needs assistance   Sitting balance-Leahy Scale: Poor       Standing balance-Leahy Scale: Poor                              Cognition Arousal/Alertness: Awake/alert Behavior During Therapy: Flat affect;WFL for tasks assessed/performed Overall Cognitive Status: Difficult to assess                         Following Commands: Follows multi-step commands inconsistently Safety/Judgement: Decreased awareness of safety;Decreased awareness of deficits   Problem Solving: Difficulty sequencing;Requires verbal cues;Requires tactile cues;Slow processing General Comments: Pt more recpetive to PT today.  He responded with one word answer but was polite and compliant.      Exercises      General Comments        Pertinent Vitals/Pain Pain Assessment: No/denies pain    Home Living  Prior Function            PT Goals (current goals can now be found in the care plan section) Acute Rehab PT Goals Patient Stated Goal: pt unable to state due to expressive difficulties and lethargy PT Goal Formulation: Patient unable to participate in goal setting Potential to Achieve Goals: Fair Progress towards PT goals: Progressing toward goals    Frequency    Min 4X/week      PT Plan Current plan remains appropriate    Co-evaluation              AM-PAC PT "6 Clicks" Mobility   Outcome Measure  Help needed turning from your back to your side while in a flat bed without using bedrails?: A Lot Help needed moving  from lying on your back to sitting on the side of a flat bed without using bedrails?: A Lot Help needed moving to and from a bed to a chair (including a wheelchair)?: Total Help needed standing up from a chair using your arms (e.g., wheelchair or bedside chair)?: Total Help needed to walk in hospital room?: Total Help needed climbing 3-5 steps with a railing? : Total 6 Click Score: 8    End of Session Equipment Utilized During Treatment: Gait belt Activity Tolerance: Patient tolerated treatment well Patient left: with call bell/phone within reach;in chair;with chair alarm set(with maximove sling under patient.) Nurse Communication: Mobility status;Need for lift equipment PT Visit Diagnosis: Other abnormalities of gait and mobility (R26.89);Hemiplegia and hemiparesis;Muscle weakness (generalized) (M62.81) Hemiplegia - Right/Left: Right     Time: 1610-9604 PT Time Calculation (min) (ACUTE ONLY): 31 min  Charges:  $Therapeutic Activity: 23-37 mins                     Joycelyn Rua, PTA Acute Rehabilitation Services Pager [phone] Office [phone]     Jakai Risse Artis Delay 01/12/2019, 12:05 PM

## 2019-01-12 NOTE — TOC Progression Note (Signed)
Transition of Care [facility]) - Progression Note    Patient Details  Name: Philip Vasquez MRN: [id number] Date of Birth: Nov 03, 1960  Transition of Care [facility]) CM/SW [place], [place] Phone Number: 01/12/2019, 3:46 PM  Clinical Narrative:   CSW following for discharge plan. Patient continues to have no bed offers. CSW contacted by Development worker, community about an incompetency letter to be signed by MD. CSW reached out to MD to get signature for letter. CSW scanned and sent back to financial counselor. Financial counselor submitting with referral to disability application.     Expected Discharge Plan: IP Rehab Facility Barriers to Discharge: SNF Pending payor source - LOG, SNF Pending bed offer, Inadequate or no insurance, Active Substance Use - Placement  Expected Discharge Plan and Services Expected Discharge Plan: [place] In-house Referral: Clinical Social Work Discharge Planning Services: CM Consult                                           Social Determinants of Health (SDOH) Interventions    Readmission Risk Interventions No flowsheet data found.

## 2019-01-12 NOTE — Plan of Care (Signed)
  Problem: Pain Managment: Goal: General experience of comfort will improve Outcome: Progressing   

## 2019-01-12 NOTE — Progress Notes (Signed)
  Date: 01/12/2019  Patient name: Philip Vasquez  Medical record number: [id number]  Date of birth: Jan 12, 1961        I have seen and evaluated this patient and I have discussed the plan of care with the house staff. Please see Dr. Chase Picket note for complete details. I concur with her findings and plan.  Would make sure that SCDs are in place given his DVT PPx is on hold given history of melena.  Recheck wounds in the AM, if erythema or swelling worsens, would start Abx.    Sid Falcon, MD 01/12/2019, 2:11 PM

## 2019-01-12 NOTE — Consult Note (Signed)
[place] Nurse wound consult note Patient receiving care in [facility] 3W20.  Consult completed remotely after review of record, including photos. 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  Scrotal wound possibly related to moisture.  Dressing procedure/placement/frequency:  Discontinue the condom catheter.  Apply Gerhardt's butt cream to the penile wound and wrap in a Xeroform gauze Kellie Simmering [phone]) Monitor the wound area(s) for worsening of condition such as: Signs/symptoms of infection,  Increase in size,  Development of or worsening of odor, Development of pain, or increased pain at the affected locations.  Notify the medical team if any of these develop.  Thank you for the consult. [place] nurse will not follow at this time.  Please re-consult the [place] team if needed.  Val Riles, RN, MSN, CWOCN, CNS-BC, pager [phone]

## 2019-01-12 NOTE — Progress Notes (Signed)
Nutrition Follow-up   RD working remotely.  DOCUMENTATION CODES:   Obesity unspecified  INTERVENTION:  Continue Ensure Enlive po BID, each supplement provides 350 kcal and 20 grams of protein.  Provide 30 ml Prostat po BID, each supplement provides 100 kcal and 15 grams of protein.   Encourage adequate PO intake.   NUTRITION DIAGNOSIS:   Inadequate oral intake related to lethargy/confusion as evidenced by estimated needs(reported meal completion); ongoing  GOAL:   Patient will meet greater than or equal to 90% of their needs; progressing  MONITOR:   PO intake, Supplement acceptance, Skin, Weight trends, Labs, I & O's  REASON FOR ASSESSMENT:   Malnutrition Screening Tool    ASSESSMENT:   58 year old man with hyperlipidemia, hypertension, and cocaine use disorder admitted with right upper and lower extremity weakness admitted with scattered acute infarcts in the medial left ACA and MCA watershed distribution.  Pt with skin ulcers on the dorsal surface of the penis and posterior scrotum. Per MD, pt high risk for decubitus ulcers. Meal completion 75% today. Pt currently has Ensure ordered and has been consuming them. RD to additionally order Prostat to aid in wound healing. Pt encouraged to eat his food at meals and to drink his supplements.   Labs and medications reviewed.   Diet Order:   Diet Order            Diet regular Room service appropriate? Yes with Assist; Fluid consistency: Thin  Diet effective now              EDUCATION NEEDS:   Not appropriate for education at this time  Skin:  Skin Assessment: Skin Integrity Issues: Skin Integrity Issues:: Other (Comment) Other: skin ulcers on the dorsal surface of the penis and posterior scrotum  Last BM:  10/5  Height:   Ht Readings from Last 1 Encounters:  12/19/18 5\' 9"  (1.753 m)    Weight:   Wt Readings from Last 1 Encounters:  01/12/19 103 kg    Ideal Body Weight:  72.7 kg  BMI:  Body mass  index is 33.52 kg/m.  Estimated Nutritional Needs:   Kcal:  2000-2200  Protein:  100-115 grams  Fluid:  >/= 2 L/day    Corrin Parker, MS, RD, LDN Pager # [phone] After hours/ weekend pager # [phone]

## 2019-01-12 NOTE — Plan of Care (Signed)
  Problem: Education: Goal: Knowledge of General Education information will improve Description Including pain rating scale, medication(s)/side effects and non-pharmacologic comfort measures Outcome: Progressing   Problem: Health Behavior/Discharge Planning: Goal: Ability to manage health-related needs will improve Outcome: Progressing   Problem: Clinical Measurements: Goal: Ability to maintain clinical measurements within normal limits will improve Outcome: Progressing Goal: Will remain free from infection Outcome: Progressing Goal: Diagnostic test results will improve Outcome: Progressing Goal: Respiratory complications will improve Outcome: Progressing Goal: Cardiovascular complication will be avoided Outcome: Progressing   Problem: Activity: Goal: Risk for activity intolerance will decrease Outcome: Progressing   Problem: Nutrition: Goal: Adequate nutrition will be maintained Outcome: Progressing   Problem: Coping: Goal: Level of anxiety will decrease Outcome: Progressing   Problem: Elimination: Goal: Will not experience complications related to bowel motility Outcome: Progressing Goal: Will not experience complications related to urinary retention Outcome: Progressing   Problem: Pain Managment: Goal: General experience of comfort will improve Outcome: Progressing   Problem: Safety: Goal: Ability to remain free from injury will improve Outcome: Progressing   Problem: Skin Integrity: Goal: Risk for impaired skin integrity will decrease Outcome: Progressing   Problem: Education: Goal: Knowledge of disease or condition will improve Outcome: Progressing Goal: Knowledge of secondary prevention will improve Outcome: Progressing Goal: Knowledge of patient specific risk factors addressed and post discharge goals established will improve Outcome: Progressing Goal: Individualized Educational Video(s) Outcome: Progressing   Problem: Coping: Goal: Will verbalize  positive feelings about self Outcome: Progressing Goal: Will identify appropriate support needs Outcome: Progressing   Problem: Health Behavior/Discharge Planning: Goal: Ability to manage health-related needs will improve Outcome: Progressing   Problem: Self-Care: Goal: Ability to participate in self-care as condition permits will improve Outcome: Progressing Goal: Verbalization of feelings and concerns over difficulty with self-care will improve Outcome: Progressing Goal: Ability to communicate needs accurately will improve Outcome: Progressing   Problem: Nutrition: Goal: Risk of aspiration will decrease Outcome: Progressing Goal: Dietary intake will improve Outcome: Progressing   Problem: Ischemic Stroke/TIA Tissue Perfusion: Goal: Complications of ischemic stroke/TIA will be minimized Outcome: Progressing  Philip Vasquez, BSN, RN 

## 2019-01-12 NOTE — Plan of Care (Signed)
  Problem: Education: Goal: Knowledge of General Education information will improve Description Including pain rating scale, medication(s)/side effects and non-pharmacologic comfort measures Outcome: Progressing   Problem: Health Behavior/Discharge Planning: Goal: Ability to manage health-related needs will improve Outcome: Progressing   Problem: Clinical Measurements: Goal: Ability to maintain clinical measurements within normal limits will improve Outcome: Progressing Goal: Will remain free from infection Outcome: Progressing Goal: Diagnostic test results will improve Outcome: Progressing Goal: Respiratory complications will improve Outcome: Progressing Goal: Cardiovascular complication will be avoided Outcome: Progressing   Problem: Activity: Goal: Risk for activity intolerance will decrease Outcome: Progressing   Problem: Nutrition: Goal: Adequate nutrition will be maintained Outcome: Progressing   Problem: Coping: Goal: Level of anxiety will decrease Outcome: Progressing   Problem: Elimination: Goal: Will not experience complications related to bowel motility Outcome: Progressing Goal: Will not experience complications related to urinary retention Outcome: Progressing   Problem: Pain Managment: Goal: General experience of comfort will improve Outcome: Progressing   Problem: Safety: Goal: Ability to remain free from injury will improve Outcome: Progressing   Problem: Skin Integrity: Goal: Risk for impaired skin integrity will decrease Outcome: Progressing   Problem: Education: Goal: Knowledge of disease or condition will improve Outcome: Progressing Goal: Knowledge of secondary prevention will improve Outcome: Progressing Goal: Knowledge of patient specific risk factors addressed and post discharge goals established will improve Outcome: Progressing Goal: Individualized Educational Video(s) Outcome: Progressing   Problem: Coping: Goal: Will verbalize  positive feelings about self Outcome: Progressing Goal: Will identify appropriate support needs Outcome: Progressing   Problem: Health Behavior/Discharge Planning: Goal: Ability to manage health-related needs will improve Outcome: Progressing   Problem: Self-Care: Goal: Ability to participate in self-care as condition permits will improve Outcome: Progressing Goal: Verbalization of feelings and concerns over difficulty with self-care will improve Outcome: Progressing Goal: Ability to communicate needs accurately will improve Outcome: Progressing   Problem: Nutrition: Goal: Risk of aspiration will decrease Outcome: Progressing Goal: Dietary intake will improve Outcome: Progressing   Problem: Ischemic Stroke/TIA Tissue Perfusion: Goal: Complications of ischemic stroke/TIA will be minimized Outcome: Progressing  Marelin Tat, BSN, RN 

## 2019-01-13 DIAGNOSIS — L98499 Non-pressure chronic ulcer of skin of other sites with unspecified severity: Secondary | ICD-10-CM

## 2019-01-13 DIAGNOSIS — N485 Ulcer of penis: Secondary | ICD-10-CM | POA: Diagnosis not present

## 2019-01-13 NOTE — Progress Notes (Signed)
   Subjective: lying in bed watching sports. Denies complaints  Objective:  Vital signs in last 24 hours: Vitals:   01/13/19 0025 01/13/19 0038 01/13/19 0355 01/13/19 0434  BP: (!) 125/103 (!) 157/87 (!) 156/94   Pulse: 74 73 81   Resp: 18  18   Temp: 99.3 F (37.4 C)  98.6 F (37 C)   TempSrc: Oral  Oral   SpO2: 99% 97% 98%   Weight:    102.5 kg  Height:       Physical Exam: General: resting comfortably in bed CV: RRR PULM: lung sounds clear NEURO: awake and alert. Right sided weakness SKIN: groin lesions reexamined this morning. No significant changes. No drainage appreciated. Summary In summary Philip Vasquez is a 58 year old gentleman who uses cocaine daily with no recorded past medical history who presented  left ACA/MCA watershed infarcts. His current deficits are right-sided weakness and dysarthria. The patient is currently medically stable and has been optimized for discharge. However, his disposition is unknown because he is not a candidate for CIR or SNF placement due to lack of insurance. He is homeless so home health is not an option.  Chronic/stable/resolved hosp problems #HTN: continue losartan, metoprolol, amlodipine, hydralizine, labetalol #AKIvs CKD?: Cr on admission 1.90, GFR 40. No baseline available. Renal function stable #Melena: Resolved.thought to be 2/2 anticoagulation. GI consulted 9/30. Suggested that bleed likely from anticoag and no need for colonoscopy at this time.Signed off. Of note, pt has never had a colonoscopy.  Plan: PPI BID. will need to be set up for colonoscopy and GI appt upon d/c #HTN: continue losartan, metoprolol, amlodipine, hydralizine, labetalol. Blood pressures slowly improving.   Assessment/Plan:  Principal Problem:   Cerebral thrombosis with cerebral infarction Active Problems:   Cocaine use disorder (HCC)   Essential hypertension   Hyperlipidemia  #Dysarthria #R sided weakness #L ACA/MCA Infarcts:  significant right sided  weakness. Neurology consulted and signed off. Plan - continue statin, Asprin, plavix - continue PT/OT  Skin ulcers. Located on the dorsal surface of the penis and posterior scrotum. Some drainage from posterior lesion. White count stable. Afebrile. High risk for decubitus ulcers: poor mobility, condom cath, poor nutrition. Wound care consult placed. Appreciate their recs. Air mattress ordered. Nutrition consult placed. Plan Frequent repositioning Barrier cream   #DVT prophylaxis. Holding due to melena and risk for rebleeding  #Dispo: discharge pending CIR or SNF placement. Medicaid app pending. Appreciate SW/CM work with this Will need follow up with neuro in 6w post d/c  Mitzi Hansen, MD 01/13/19 5:29 AM

## 2019-01-13 NOTE — Progress Notes (Addendum)
PT Cancellation Note  Patient Details Name: Philip Vasquez MRN: [id number] DOB: 17-Dec-1960   Cancelled Treatment:    Reason Eval/Treat Not Completed: (P) Patient declined, no reason specified Pt reports," I'm not gonna do it." repeatedly.  PTA provided education and encouragement but he remains to repeat that he is not going to do anything.  Will continue efforts per POC as behavior allows.  He has become very self limiting.     Venecia Mehl [name] 01/13/2019, 3:41 PM  Governor Rooks, PTA Acute Rehabilitation Services Pager [phone] Office [phone]

## 2019-01-13 NOTE — Progress Notes (Signed)
  Speech Language Pathology Treatment: Dysphagia;Cognitive-Linquistic  Patient Details Name: Philip Vasquez MRN: [id number] DOB: 09-Jun-1960 Today's Date: 01/13/2019 Time: 6213-0865 SLP Time Calculation (min) (ACUTE ONLY): 16 min  Assessment / Plan / Recommendation Clinical Impression  Pt was seen for dysphagia and cognitive-communication treatment.  Pt was encountered awake in bed with two untouched lunch meal trays.  Pt consumed trials of thin liquid; however, he refused all solid trials despite being given multiple choices and verbal encouragement, stating that he was "not hungry".  No clinical s/sx of aspiration were observed with trials of thin liquid during serial straw sips.    Pt answered basic problem solving/safety judgment questions with 100% accuracy including how to call his nurse via his call bell.  He additionally completed an auditory attention task that required him to repeat 3-6 number sequences.  Pt repeated 3 & 4 number sequences with 4/4 (100%) accuracy, and he repeated 5 & 6 number sequences with 3/6 (50%) accuracy independently, improving to 5/6 (83%) accuracy given repetitions.  Although pt reported that he consumed breakfast this AM, he was unable to independently recall breakfast items and he required a suggested list of items to choose from to aid in recall.  Pt's speech was approximately 80% intelligible at conversation level to an unfamiliar listener.  Session was abbreviated secondary to pt refusal to complete additional cognitive-linguistic tasks, stating "Turn the TV on and leave me alone, please".   Recommend continuation current diet and skilled ST targeting dysphagia and cognitive-linguistic deficits.     HPI HPI: Pt is a 58 year old gentleman with no past medical history who presented with AMS and right-sided weakness. MRI: Widely scattered small acute infarcts in the medial lefthemisphere involving left ACA and left ACA/MCA watershed area. UA positive for cocaine use.        SLP Plan  Continue with current plan of care       Recommendations  Diet recommendations: Regular;Thin liquid Liquids provided via: Cup;Straw Medication Administration: Whole meds with puree Supervision: Full supervision/cueing for compensatory strategies;Staff to assist with self feeding Compensations: Slow rate;Small sips/bites;Minimize environmental distractions;Monitor for anterior loss Postural Changes and/or Swallow Maneuvers: Seated upright 90 degrees;Upright 30-60 min after meal                Oral Care Recommendations: Oral care BID Follow up Recommendations: 24 hour supervision/assistance;Inpatient Rehab SLP Visit Diagnosis: Dysphagia, unspecified (R13.10);Cognitive communication deficit (R41.841) Plan: Continue with current plan of care       Bretta Bang, M.S., [place] Office: [phone]               [place] 01/13/2019, 1:51 PM

## 2019-01-14 DIAGNOSIS — R339 Retention of urine, unspecified: Secondary | ICD-10-CM | POA: Clinically undetermined

## 2019-01-14 DIAGNOSIS — F32A Depression, unspecified: Secondary | ICD-10-CM | POA: Insufficient documentation

## 2019-01-14 DIAGNOSIS — F329 Major depressive disorder, single episode, unspecified: Secondary | ICD-10-CM | POA: Clinically undetermined

## 2019-01-14 MED ORDER — FLUOXETINE HCL 20 MG PO CAPS
20.0000 mg | ORAL_CAPSULE | Freq: Every day | ORAL | Status: DC
  Administered 2019-01-14 – 2019-01-21 (×8): 20 mg via ORAL
  Filled 2019-01-14 (×7): qty 1

## 2019-01-14 NOTE — Progress Notes (Signed)
  Date: 01/14/2019  Patient name: Philip Vasquez  Medical record number: [id number]  Date of birth: Aug 08, 1960        I have seen and evaluated this patient and I have discussed the plan of care with the house staff. Please see their note for complete details. I concur with their findings.  Patient endorsing depression, interested in starting antidepressant therapy. Started Prozac 20 mg daily. He knows this will take 4-6 weeks to become effective.   Also noted to have urinary retention today on bladder scan. Catheter placed.   Discharge pending Medicaid approval.   Velna Ochs, MD 01/14/2019, 6:05 PM

## 2019-01-14 NOTE — Plan of Care (Signed)
  Problem: Education: Goal: Knowledge of General Education information will improve Description Including pain rating scale, medication(s)/side effects and non-pharmacologic comfort measures Outcome: Progressing   Problem: Health Behavior/Discharge Planning: Goal: Ability to manage health-related needs will improve Outcome: Progressing   Problem: Clinical Measurements: Goal: Ability to maintain clinical measurements within normal limits will improve Outcome: Progressing Goal: Will remain free from infection Outcome: Progressing Goal: Diagnostic test results will improve Outcome: Progressing Goal: Respiratory complications will improve Outcome: Progressing Goal: Cardiovascular complication will be avoided Outcome: Progressing   Problem: Activity: Goal: Risk for activity intolerance will decrease Outcome: Progressing   Problem: Nutrition: Goal: Adequate nutrition will be maintained Outcome: Progressing   Problem: Coping: Goal: Level of anxiety will decrease Outcome: Progressing   Problem: Elimination: Goal: Will not experience complications related to bowel motility Outcome: Progressing Goal: Will not experience complications related to urinary retention Outcome: Progressing   Problem: Pain Managment: Goal: General experience of comfort will improve Outcome: Progressing   Problem: Safety: Goal: Ability to remain free from injury will improve Outcome: Progressing   Problem: Skin Integrity: Goal: Risk for impaired skin integrity will decrease Outcome: Progressing   Problem: Education: Goal: Knowledge of disease or condition will improve Outcome: Progressing Goal: Knowledge of secondary prevention will improve Outcome: Progressing Goal: Knowledge of patient specific risk factors addressed and post discharge goals established will improve Outcome: Progressing Goal: Individualized Educational Video(s) Outcome: Progressing   Problem: Coping: Goal: Will verbalize  positive feelings about self Outcome: Progressing Goal: Will identify appropriate support needs Outcome: Progressing   Problem: Health Behavior/Discharge Planning: Goal: Ability to manage health-related needs will improve Outcome: Progressing   Problem: Self-Care: Goal: Ability to participate in self-care as condition permits will improve Outcome: Progressing Goal: Verbalization of feelings and concerns over difficulty with self-care will improve Outcome: Progressing Goal: Ability to communicate needs accurately will improve Outcome: Progressing   Problem: Nutrition: Goal: Risk of aspiration will decrease Outcome: Progressing Goal: Dietary intake will improve Outcome: Progressing   Problem: Ischemic Stroke/TIA Tissue Perfusion: Goal: Complications of ischemic stroke/TIA will be minimized Outcome: Progressing  Cong Hightower, BSN, RN 

## 2019-01-14 NOTE — Progress Notes (Signed)
Bladder scanned patient per MD order. Volume 449cc. MD notified, RN awaiting order for foley catheter. MD aware of 3x in and out catheter policy, would like to place foley instead of 3x in and out due to patient non- compliance with teaching of urinal, not able to place condom catheter due to skin integrity issues, retention of urine, and risk for MASD with limited mobility and incontinence. Attempted to teach patient on 4 separate occassions on use of urinal, patient states he does not want to use the urinal, refuses to call for assistance. With instruction, patient is able to correctly place and hold urinal, however RN has found the empty urinal on the floor multiple times and the patient refuses to use it. After bladder scan of 449cc, RN asked patient if he could try to use the urinal, and the patient stated "I don't have to pee and I don't want to." MD notified.

## 2019-01-14 NOTE — Progress Notes (Signed)
Physical Therapy Treatment Patient Details Name: Philip Vasquez MRN: [id number] DOB: Nov 22, 1960 Today's Date: 01/14/2019    History of Present Illness Philip Vasquez is a 58 year old gentleman with no past medical history who presented with AMS and right-sided weakness. MRI: Widely scattered small acute infarcts in the medial lefthemisphere involving left ACA and left ACA/MCA watershed area.    PT Comments    Pt performed transfer training and functional mobility during session.  He still required use of sara + sit to stand lift for standing trials.  He was able to weight shift during session and tolerate 10 min of standing.  Pt presents with increased hip flexion as he fatigues.  Post standing returned to seated position and assisted nursing with bathing.  Of note: Brother upset when patient stood and he saw urine and feces under patient.  He requested patient to sit back down and PTA informed him it would be better to clean his backside in standing and then position in chair for completion of bathing to avoid sitting in soiled matter.      Follow Up Recommendations  Supervision/Assistance - 24 hour;SNF     Equipment Recommendations  (defer to post acute)    Recommendations for Other Services Rehab consult     Precautions / Restrictions Precautions Precautions: Fall Precaution Comments: apneic, R UE/LE weakness Restrictions Weight Bearing Restrictions: No    Mobility  Bed Mobility Overal bed mobility: Needs Assistance Bed Mobility: Rolling   Sidelying to sit: Mod assist       General bed mobility comments: Pt required assistance to  move R LE to edge of bed and to elevate trunk into sitting.  He actively pariticpated in trunk elevation and able to support posture during session.  Transfers Overall transfer level: Needs assistance Equipment used: Ambulation equipment used(sara + with foot plate utilized.) Transfers: Sit to/from Stand Sit to Stand: Total assist(with sara +  lift.  He appears more engaged in participating with lift equipment to achieve standing.  He lacks hip extension as he begins to fatigue.)         General transfer comment: Pt performed standing x 10 min with cues for upper trunk control and B HIp extension.  Pt able to tolerate weight bearing and weight shifting in standing.  On initial stand.  Pt noted to be incontinent of bowel and bladder upon initial standing.  PTA performed pericare in standing.  Ambulation/Gait Ambulation/Gait assistance: (NT but based on standing tolerance today he should be ready to progress to gt in sara +.)               Stairs             Wheelchair Mobility    Modified Rankin (Stroke Patients Only)       Balance Overall balance assessment: Needs assistance Sitting-balance support: Single extremity supported;Bilateral upper extremity supported;Feet supported Sitting balance-Leahy Scale: Poor       Standing balance-Leahy Scale: Poor Standing balance comment: Heavy R lateral lean with posterior bias.                            Cognition Arousal/Alertness: Awake/alert Behavior During Therapy: Flat affect;WFL for tasks assessed/performed Overall Cognitive Status: (not formally assessed but able to follow commands during session.)  Exercises      General Comments        Pertinent Vitals/Pain Pain Assessment: No/denies pain Pain Score: 0-No pain    Home Living                      Prior Function            PT Goals (current goals can now be found in the care plan section) Acute Rehab PT Goals Patient Stated Goal: pt unable to state due to expressive difficulties and lethargy PT Goal Formulation: Patient unable to participate in goal setting Potential to Achieve Goals: Fair Progress towards PT goals: Progressing toward goals    Frequency    Min 4X/week      PT Plan Current plan remains  appropriate    Co-evaluation              AM-PAC PT "6 Clicks" Mobility   Outcome Measure  Help needed turning from your back to your side while in a flat bed without using bedrails?: A Lot Help needed moving from lying on your back to sitting on the side of a flat bed without using bedrails?: A Lot Help needed moving to and from a bed to a chair (including a wheelchair)?: Total Help needed standing up from a chair using your arms (e.g., wheelchair or bedside chair)?: Total Help needed to walk in hospital room?: Total Help needed climbing 3-5 steps with a railing? : Total 6 Click Score: 8    End of Session Equipment Utilized During Treatment: Gait belt Activity Tolerance: Patient tolerated treatment well Patient left: with call bell/phone within reach;in chair;with chair alarm set Nurse Communication: Mobility status;Need for lift equipment(utilized sara + to lift patient from bed to recliner.) PT Visit Diagnosis: Other abnormalities of gait and mobility (R26.89);Hemiplegia and hemiparesis;Muscle weakness (generalized) (M62.81) Hemiplegia - Right/Left: Right     Time: 3244-0102 PT Time Calculation (min) (ACUTE ONLY): 33 min  Charges:  $Therapeutic Activity: 23-37 mins                     Joycelyn Rua, PTA Acute Rehabilitation Services Pager [phone] Office [phone]     Vallory Oetken Artis Delay 01/14/2019, 2:16 PM

## 2019-01-14 NOTE — Progress Notes (Signed)
Around 12 pm after bladder scanning the patient, RN checked to make sure patient was clean. Patient had scant amount of urine, changed pad, no BM noted. Around 1230, brother Legrand Como) called RN to room. Legrand Como asked if the patient had a bath, and RN replied that he had not had a bath today, but had one yesterday. Legrand Como stated "It's already noon so why hasn't the patient had a bath yet, I want to speak with your supervisor." RN told Legrand Como that the tech could help me get the patient bathed shortly, and Legrand Como replied that he would like to speak with Gwyndolyn Saxon, the MD, and the PT. Gwyndolyn Saxon, MD, and PT notified.

## 2019-01-14 NOTE — Progress Notes (Addendum)
   Subjective: Lying in bed. Brother came up to visit later in afternoon. Updated him regarding patient's care. Brother motivating brother to sit up in the chair today. I communicated the importance that he continue to do this.  Objective:  Vital signs in last 24 hours: Vitals:   01/14/19 0408 01/14/19 0429 01/14/19 0937 01/14/19 1122  BP: (!) 146/95  (!) 150/94 (!) 157/93  Pulse: 78  78 65  Resp: 18  17 17   Temp: 98.2 F (36.8 C)  98.1 F (36.7 C) 98.6 F (37 C)  TempSrc: Oral  Oral Oral  SpO2: 100%  99% 98%  Weight:  110.7 kg    Height:       Physical Exam: General: resting comfortably in bed Cardiac: RRR Pulm: lung sounds clear Psych: depressed mood.  Summary In summary Philip Vasquez is a 58 year old gentleman who uses cocaine daily with no recorded past medical history who presented  left ACA/MCA watershed infarcts. His current deficits are right-sided weakness and dysarthria. The patient is currently medically stable and has been optimized for discharge. However, his disposition is unknown because he is not a candidate for CIR or SNF placement due to lack of insurance. He is homeless so home health is not an option.  Chronic/stable/resolved hosp problems  #AKIvs CKD?: Cr on admission 1.90, GFR 40. No baseline available. Renal function stable #Melena: Resolved.thought to be 2/2 anticoagulation. GI consulted 9/30. Suggested that bleed likely from anticoag and no need for colonoscopy at this time.Signed off. Of note, pt has never had a colonoscopy.  Plan: PPI BID. will need to be set up for colonoscopy and GI appt upon d/c #HTN: continue losartan, metoprolol, amlodipine, hydralizine, labetalol. Blood pressures slowly improving.   Assessment/Plan:  Principal Problem:   Cerebral thrombosis with cerebral infarction Active Problems:   Cocaine use disorder (HCC)   Essential hypertension   Hyperlipidemia   Ulcer of penis  #Dysarthria #R sided weakness #L ACA/MCA Infarcts:   significant right sided weakness. Neurology consulted and signed off. Pt also experiencing depression 2/2 to current situation. Poor sleep, poor appetite, poor motivation, low energy. Plan - continue statin, Asprin, plavix - continue PT/OT. Pt has been refusing. Pt's brother prsent in room when pt refused today. Brother motivated pt to get into chair to eat. -starting prozac 20mg  today. Can increase to 40 in 1-2w if sx persist.  Skin ulcers. Located on the dorsal surface of the penis and posterior scrotum. Some drainage from posterior lesion. Does not appear infectious at this point. Condom cath removed 10/7. Urinal trialed and failed. Poor output overnight. Bladder scan today around 545mL. Pt denied urge to urinate at that time. In light of these new ulcers and inability to utilize urinal, foley cath placed.  Plan Frequent repositioning Barrier cream Foley cath   #DVT prophylaxis. Holding due to melena and risk for rebleeding  #Dispo: discharge pending CIR or SNF placement. Medicaid app pending. Appreciate SW/CM work with this Will need follow up with neuro in 6w post d/c  Mitzi Hansen, MD 01/14/19 12:57 PM

## 2019-01-14 NOTE — Progress Notes (Signed)
Pt's brother came to nursing station upset stating pt needed to be cleaned.  Aimee with PT was in the room working with patient who reported he urinated when she sat him on the side of the bed.  Pt in chair when entering room.  Stated he wanted to speak with Gwyndolyn Saxon, [place].  I explained that he was off of the unit and I was CN and could help him.  He questioned "why can't that nurse smell he's dirty?"  I explained I couldn't speak for her but would speak to her, and in the meantime I would assist with cleaning the patient.  I also offered to have the [facility] speak with him.  He stormed out of room stating "Oh I'm making a call."  Patient is calm, completely bathed, sitting in chair with chair alarm activated and call bell within reach.

## 2019-01-15 LAB — CBC
HCT: 30.8 % — ABNORMAL LOW (ref 39.0–52.0)
Hemoglobin: 10.1 g/dL — ABNORMAL LOW (ref 13.0–17.0)
MCH: 29.4 pg (ref 26.0–34.0)
MCHC: 32.8 g/dL (ref 30.0–36.0)
MCV: 89.5 fL (ref 80.0–100.0)
Platelets: 287 10*3/uL (ref 150–400)
RBC: 3.44 MIL/uL — ABNORMAL LOW (ref 4.22–5.81)
RDW: 12.2 % (ref 11.5–15.5)
WBC: 9.4 10*3/uL (ref 4.0–10.5)
nRBC: 0 % (ref 0.0–0.2)

## 2019-01-15 MED ORDER — LOSARTAN POTASSIUM 50 MG PO TABS
50.0000 mg | ORAL_TABLET | Freq: Every day | ORAL | Status: DC
  Administered 2019-01-15 – 2019-05-04 (×110): 50 mg via ORAL
  Filled 2019-01-15 (×111): qty 1

## 2019-01-15 NOTE — Plan of Care (Signed)
  Problem: Education: Goal: Knowledge of General Education information will improve Description: Including pain rating scale, medication(s)/side effects and non-pharmacologic comfort measures Outcome: Not Progressing   

## 2019-01-15 NOTE — Progress Notes (Addendum)
Patient pulled foley cath out x 2 blood coming from penis. Bed pad wet from blood and urine bladder scan done and no need for I&O at this time. MD notified re: bleeding from penis came to the floor to exam patient. Will continue to monitor.  Arash Karstens, Tivis Ringer, RN

## 2019-01-15 NOTE — Progress Notes (Addendum)
   Subjective: resting in bed comfortably. Brother in room this morning. Updated on care. Objective:  Vital signs in last 24 hours: Vitals:   01/14/19 1630 01/14/19 2119 01/14/19 2328 01/15/19 0407  BP: (!) 144/83 (!) 156/93 (!) 156/96 (!) 161/99  Pulse: 83 95 77 82  Resp: 17 18 18 18   Temp: 98.7 F (37.1 C) 98.5 F (36.9 C) 98.6 F (37 C)   TempSrc: Oral Oral Oral   SpO2: 100% 98% 100% 100%  Weight:      Height:       Physical Exam: General: resting comfortably in bed Cardiac: RRR Pulm: lung sounds clear Psych: depressed mood.  Summary In summary Philip Vasquez is a 58 year old gentleman who uses cocaine daily with no recorded past medical history who presented  left ACA/MCA watershed infarcts. His current deficits are right-sided weakness and dysarthria. The patient is currently medically stable and has been optimized for discharge. However, his disposition is unknown because he is not a candidate for CIR or SNF placement due to lack of insurance. He is homeless so home health is not an option.  Chronic/stable/resolved hosp problems  #AKIvs CKD?: Cr on admission 1.90, GFR 40. No baseline available. Renal function stable #Melena: Resolved.thought to be 2/2 anticoagulation. GI consulted 9/30. Suggested that bleed likely from anticoag and no need for colonoscopy at this time.Signed off. Of note, pt has never had a colonoscopy.  Plan: PPI BID. will need to be set up for colonoscopy and GI appt upon d/c #HTN: continue losartan, metoprolol, amlodipine, hydralizine, labetalol. Blood pressures slowly improving.   Assessment/Plan:  Principal Problem:   Cerebral thrombosis with cerebral infarction Active Problems:   Cocaine use disorder (HCC)   Essential hypertension   Hyperlipidemia   Ulcer of penis   Urinary retention   Depression  #Dysarthria #R sided weakness #L ACA/MCA Infarcts:  significant right sided weakness. Neurology consulted and signed off. Pt also experiencing  depression 2/2 to current situation. Poor sleep, poor appetite, poor motivation, low energy. RN notes that patient is now able to be transferred to the chair with assist of 2. Encouraged them to try to get him up into the chair a couple times a day. Plan - continue statin, Asprin, plavix - continue PT/OT. Pt has been refusing. -prozac 20mg  for associated depression. Can increase to 40 in 1-2w if sx persist.  Skin ulcers. Located on the dorsal surface of the penis and posterior scrotum. Some drainage from posterior lesion. Does not appear infectious at this point. Condom cath removed 10/7. Urinal trialed and failed. Foley placed 10/8 for urinary retention and to avoid further skin breakdown with wet sheets. Appears patient has pulled out the foley x2 overnight.  Plan Frequent repositioning Barrier cream Air mattress Straight cath with frequent bladder scans for now   #DVT prophylaxis. Holding due to melena and risk for rebleeding  #Dispo: discharge pending CIR or SNF placement. Medicaid app pending. Appreciate SW/CM work with this Will need follow up with neuro in 6w post d/c  Mitzi Hansen, MD 01/15/19 5:30 AM

## 2019-01-15 NOTE — Progress Notes (Signed)
Physical Therapy Treatment Patient Details Name: Philip Vasquez MRN: [id number] DOB: 09-17-1960 Today's Date: 01/15/2019    History of Present Illness [name] is a 58 year old gentleman with no past medical history who presented with AMS and right-sided weakness. MRI: Widely scattered small acute infarcts in the medial lefthemisphere involving left ACA and left ACA/MCA watershed area.    PT Comments    Pt supine in bed and refused OOB mobility despite max cues and encouragement.  He was agreeable to supine exercises and positioning for chair position in his bed.  He required min to moderate assistance to position in supine.  He performed LE exercises requiring PROM for R side and AROM for L.  He presents with increased c/o pain in R UE.  He was able to flex and extend digits but continues to lack full ROM.  Plan next session for progression to OOB mobility if patient is willing.  He continues to self limit progress.  Based on his presentation SNF placement remains the best d/c plan.      Follow Up Recommendations  Supervision/Assistance - 24 hour;SNF     Equipment Recommendations  (defer to post acute)    Recommendations for Other Services Rehab consult     Precautions / Restrictions Precautions Precautions: Fall Precaution Comments: apneic, R UE/LE weakness Restrictions Weight Bearing Restrictions: No    Mobility  Bed Mobility Overal bed mobility: Needs Assistance Bed Mobility: Rolling Rolling: Min assist(to the R)         General bed mobility comments: Pt able to roll to the R side with min assistance to check for incontinence.  He did utilized LUE and moderate assistance on right to boost to [facility] to be positioned into chair position.  Transfers                 General transfer comment: Pt refused OOB mobility.  Ambulation/Gait                 Stairs             Wheelchair Mobility    Modified Rankin (Stroke Patients Only)       Balance                                             Cognition Arousal/Alertness: Awake/alert Behavior During Therapy: Flat affect;WFL for tasks assessed/performed   Area of Impairment: Following commands;Safety/judgement;Problem solving                       Following Commands: Follows multi-step commands inconsistently;Follows one step commands with increased time Safety/Judgement: Decreased awareness of safety;Decreased awareness of deficits   Problem Solving: Difficulty sequencing;Requires verbal cues;Requires tactile cues;Slow processing General Comments: Pt agreeable to PT but refused OOB, he was able to verbalize pain in RUE but had difficulty following simple commands for supine LE exercises.      Exercises General Exercises - Upper Extremity Shoulder Flexion: AROM;Both;AAROM;10 reps;Supine Shoulder Extension: AROM;AAROM;Both;10 reps;Supine Digit Composite Flexion: AROM;Right;10 reps;Supine Composite Extension: AROM;Right;10 reps;Supine General Exercises - Lower Extremity Ankle Circles/Pumps: AROM;Both;10 reps;Supine;PROM(clonic jerking on R side.) Quad Sets: (attempted on LLE and he was unable to follow commands for isometrics.) Heel Slides: AROM;PROM;Both;10 reps;Supine(trace gravity assisted knee extension on L, difficult to tell if this was him or just gravity.) Hip ABduction/ADduction: AROM;PROM;10 reps;Supine;Both Straight Leg Raises: AROM;Both;10 reps;Supine;PROM Other Exercises Other  Exercises: Pt performed elbow ER/IR with PROM, pain noted with ER.  x10 reps.  Limited movement within range of pain tolerance.    General Comments        Pertinent Vitals/Pain Pain Assessment: Faces Pain Score: 8  Pain Location: R UE more painful with R shoulder ER. R hand with digit extension Pain Descriptors / Indicators: Discomfort;Grimacing Pain Intervention(s): Monitored during session;Repositioned    Home Living                      Prior  Function            PT Goals (current goals can now be found in the care plan section) Acute Rehab PT Goals Patient Stated Goal: pt unable to state due to expressive difficulties and lethargy Potential to Achieve Goals: Fair Progress towards PT goals: Progressing toward goals    Frequency    Min 4X/week      PT Plan Current plan remains appropriate    Co-evaluation              AM-PAC PT "6 Clicks" Mobility   Outcome Measure  Help needed turning from your back to your side while in a flat bed without using bedrails?: A Lot Help needed moving from lying on your back to sitting on the side of a flat bed without using bedrails?: A Lot Help needed moving to and from a bed to a chair (including a wheelchair)?: Total Help needed standing up from a chair using your arms (e.g., wheelchair or bedside chair)?: Total Help needed to walk in hospital room?: Total Help needed climbing 3-5 steps with a railing? : Total 6 Click Score: 8    End of Session Equipment Utilized During Treatment: Gait belt Activity Tolerance: Patient tolerated treatment well Patient left: with call bell/phone within reach;in bed;with bed alarm set;with family/visitor present;with nursing/sitter in room Nurse Communication: Mobility status PT Visit Diagnosis: Other abnormalities of gait and mobility (R26.89);Hemiplegia and hemiparesis;Muscle weakness (generalized) (M62.81) Hemiplegia - Right/Left: Right     Time: 4742-5956 PT Time Calculation (min) (ACUTE ONLY): 21 min  Charges:  $Therapeutic Exercise: 8-22 mins                     Joycelyn Rua, PTA Acute Rehabilitation Services Pager [phone] Office [phone]     Chen Saadeh Artis Delay 01/15/2019, 12:16 PM

## 2019-01-15 NOTE — Progress Notes (Signed)
Patient refused to eat all day, encouraged multiple times to eat breakfast, lunch, and dinner. Attempted to hand patient food to feed himself. Tech also attempted to assist with eating, patient kept stating that he did not want to eat and he wasn't hungry. Ordered food that appeals to him, he still refused to eat. MD aware.

## 2019-01-16 ENCOUNTER — Inpatient Hospital Stay (HOSPITAL_COMMUNITY): Payer: Medicaid Other

## 2019-01-16 DIAGNOSIS — R1084 Generalized abdominal pain: Secondary | ICD-10-CM

## 2019-01-16 LAB — URINALYSIS, ROUTINE W REFLEX MICROSCOPIC
Bacteria, UA: NONE SEEN
Bilirubin Urine: NEGATIVE
Glucose, UA: NEGATIVE mg/dL
Ketones, ur: NEGATIVE mg/dL
Nitrite: NEGATIVE
Protein, ur: 30 mg/dL — AB
RBC / HPF: >50 RBC/hpf — ABNORMAL HIGH (ref 0–5)
Specific Gravity, Urine: 1.019 (ref 1.005–1.030)
pH: 5 (ref 5.0–8.0)

## 2019-01-16 LAB — COMPREHENSIVE METABOLIC PANEL
ALT: 45 U/L — ABNORMAL HIGH (ref 0–44)
AST: 22 U/L (ref 15–41)
Albumin: 3 g/dL — ABNORMAL LOW (ref 3.5–5.0)
Alkaline Phosphatase: 57 U/L (ref 38–126)
Anion gap: 11 (ref 5–15)
BUN: 14 mg/dL (ref 6–20)
CO2: 21 mmol/L — ABNORMAL LOW (ref 22–32)
Calcium: 9.2 mg/dL (ref 8.9–10.3)
Chloride: 106 mmol/L (ref 98–111)
Creatinine, Ser: 1.22 mg/dL (ref 0.61–1.24)
GFR calc Af Amer: >60 mL/min (ref >60)
GFR calc non Af Amer: >60 mL/min (ref >60)
Glucose, Bld: 116 mg/dL — ABNORMAL HIGH (ref 70–99)
Potassium: 3.9 mmol/L (ref 3.5–5.1)
Sodium: 138 mmol/L (ref 135–145)
Total Bilirubin: 0.5 mg/dL (ref 0.3–1.2)
Total Protein: 6.7 g/dL (ref 6.5–8.1)

## 2019-01-16 LAB — CBC
HCT: 30.7 % — ABNORMAL LOW (ref 39.0–52.0)
Hemoglobin: 10.4 g/dL — ABNORMAL LOW (ref 13.0–17.0)
MCH: 30 pg (ref 26.0–34.0)
MCHC: 33.9 g/dL (ref 30.0–36.0)
MCV: 88.5 fL (ref 80.0–100.0)
Platelets: 264 10*3/uL (ref 150–400)
RBC: 3.47 MIL/uL — ABNORMAL LOW (ref 4.22–5.81)
RDW: 12.3 % (ref 11.5–15.5)
WBC: 9.3 10*3/uL (ref 4.0–10.5)
nRBC: 0 % (ref 0.0–0.2)

## 2019-01-16 MED ORDER — MIRTAZAPINE 15 MG PO TABS
7.5000 mg | ORAL_TABLET | Freq: Every day | ORAL | Status: DC
  Administered 2019-01-16 – 2019-06-02 (×138): 7.5 mg via ORAL
  Filled 2019-01-16 (×141): qty 1

## 2019-01-16 NOTE — Progress Notes (Signed)
Subjective: complains of abd pain this morning. Periumbilical. No other complaints. Objective:  Vital signs in last 24 hours: Vitals:   01/16/19 0027 01/16/19 0439 01/16/19 0441 01/16/19 0500  BP: (!) 152/93 (!) 147/100 (!) 151/94   Pulse: 91 88    Resp:  16    Temp: 99.8 F (37.7 C) 99.3 F (37.4 C)    TempSrc: Oral Oral    SpO2: 95% 94%    Weight:    131.5 kg  Height:       Physical Exam: General: in no acute distress Heart: RRR Lung sounds clear Abd: distended. Firm. Tender to palpation. Hypoactive bowel sounds. No rebound tenderness. Psych: depressed affect  Summary In summary Philip Vasquez is a 58 year old gentleman who uses cocaine daily with no recorded past medical history who presented  left ACA/MCA watershed infarcts. His current deficits are right-sided weakness and dysarthria. The patient is currently medically stable and has been optimized for discharge. However, his disposition is unknown because he is not a candidate for CIR or SNF placement due to lack of insurance. He is homeless so home health is not an option.  Chronic/stable/resolved hosp problems  #AKIvs CKD?: Cr on admission 1.90, GFR 40. No baseline available. Renal function stable #Melena: Resolved.thought to be 2/2 anticoagulation. GI consulted 9/30. Suggested that bleed likely from anticoag and no need for colonoscopy at this time.Signed off. Of note, pt has never had a colonoscopy.  Plan: PPI BID. will need to be set up for colonoscopy and GI appt upon d/c  #Dysarthria #R sided weakness #L ACA/MCA Infarcts:  significant right sided weakness. Neurology consulted and signed off. Pt also experiencing depression 2/2 to current situation. Poor sleep, poor appetite, poor motivation, low energy. RN notes that patient is now able to be transferred to the chair with assist of 2. Encouraged them to try to get him up into the chair a couple times a day. Plan - continue statin, Asprin, plavix - continue PT/OT. Pt  has been refusing. -prozac 20mg  for associated depression. Can increase to 40 in 1-2w if sx persist. Can consider adding on seroquel at night for poor appetite   Assessment/Plan:  Principal Problem:   Cerebral thrombosis with cerebral infarction Active Problems:   Cocaine use disorder (HCC)   Essential hypertension   Hyperlipidemia   Ulcer of penis   Urinary retention   Depression   Abdominal pain. Began overnight. Periumbilical. No n/v. Passing flatus.  Tmax 99.7. Last BM 10/8. Urinating ok. Minimal urine on bladder scan this morning. Obstruction vs ischemic bowel vs gastritis vs UTI. Of note, did have melena 2 weeks ago. Plan Will obtain KUB  CBC, CMP UA  Skin ulcers. Located on the dorsal surface of the penis and posterior scrotum. Some drainage from posterior lesion. Does not appear infectious at this point.  Plan Frequent repositioning Barrier cream Air mattress Will try condom cath again as he is not using the urinal and is urinating in bed sheets  #HTN: continue losartan, metoprolol, amlodipine, hydralizine, labetalol. Losartan increased to 50mg  yesterday. Will recheck renal function in a few days.  #Poor appetite and likely malnutrition. Nursing notes indicate poor caloric intake. Pt refusing to eat much. Nursing offering assistance with eating. Hoping this will improve with antidepressant.   #Intermittent Urinary retention. Had ok output with condom cath however had to remove due to skin ulcers. Since taking off condom cath, pt has been offered assistance with urinal but is refusing and RN has noted him throwing  it across the room. Bladder scan on 10/8 +538mL--pt had noted no need to urinate. Attempted foley a few days ago however pt had ripped in out x2. Minimal urine on bladder scan this morning.  Plan Frequent bladder scans Straight cath for urinary retention  #DVT prophylaxis. Holding due to melena and risk for rebleeding  #Dispo: discharge pending CIR or SNF  placement. Medicaid app pending. Appreciate SW/CM work with this Will need follow up with neuro in 6w post d/c  Elige Radon, MD 01/16/19 5:42 AM

## 2019-01-17 ENCOUNTER — Inpatient Hospital Stay (HOSPITAL_COMMUNITY): Payer: Medicaid Other

## 2019-01-17 LAB — CBC WITH DIFFERENTIAL/PLATELET
Abs Immature Granulocytes: 0.03 10*3/uL (ref 0.00–0.07)
Basophils Absolute: 0 10*3/uL (ref 0.0–0.1)
Basophils Relative: 0 %
Eosinophils Absolute: 0.1 10*3/uL (ref 0.0–0.5)
Eosinophils Relative: 1 %
HCT: 29.6 % — ABNORMAL LOW (ref 39.0–52.0)
Hemoglobin: 10.3 g/dL — ABNORMAL LOW (ref 13.0–17.0)
Immature Granulocytes: 0 %
Lymphocytes Relative: 17 %
Lymphs Abs: 1.7 10*3/uL (ref 0.7–4.0)
MCH: 30.6 pg (ref 26.0–34.0)
MCHC: 34.8 g/dL (ref 30.0–36.0)
MCV: 87.8 fL (ref 80.0–100.0)
Monocytes Absolute: 0.8 10*3/uL (ref 0.1–1.0)
Monocytes Relative: 8 %
Neutro Abs: 7.6 10*3/uL (ref 1.7–7.7)
Neutrophils Relative %: 74 %
Platelets: 284 10*3/uL (ref 150–400)
RBC: 3.37 MIL/uL — ABNORMAL LOW (ref 4.22–5.81)
RDW: 12.2 % (ref 11.5–15.5)
WBC: 10.3 10*3/uL (ref 4.0–10.5)
nRBC: 0 % (ref 0.0–0.2)

## 2019-01-17 LAB — CBC
HCT: 31 % — ABNORMAL LOW (ref 39.0–52.0)
Hemoglobin: 10.4 g/dL — ABNORMAL LOW (ref 13.0–17.0)
MCH: 29.7 pg (ref 26.0–34.0)
MCHC: 33.5 g/dL (ref 30.0–36.0)
MCV: 88.6 fL (ref 80.0–100.0)
Platelets: 291 10*3/uL (ref 150–400)
RBC: 3.5 MIL/uL — ABNORMAL LOW (ref 4.22–5.81)
RDW: 12.1 % (ref 11.5–15.5)
WBC: 10.4 10*3/uL (ref 4.0–10.5)
nRBC: 0 % (ref 0.0–0.2)

## 2019-01-17 NOTE — Progress Notes (Signed)
Paged regarding acute hematuria after in/out cath for urinary retention.   On examination, patient is hemodynamically stable, without tachycardia. His abdomen is mildly rigid, more likely from obesity than acute abdomen. He is non-tender to palpation and bowel sounds are normoactive. There is presence of a thin clot, approximately 6 inches long just removed from the urethra by the nurse at bedside. At the urethral opening, there is a small clot present, but no other bleeding noted. Nurse had a small basin with a moderate amount of congealed blood. Patient was in no acute distress.   He is not on any DVT ppx or anticoagulants at this time. He is on Plavix and aspirin for recent CVA.  For further evaluation, CBC stat was ordered and showed a hemoglobin stable from lab work this AM. Concern for bladder perforation vs urethral injury. CT abdomen/Pelvis W and WO has been ordered. Will follow up results.    Dr. Jose Persia Internal Medicine PGY-1  Pager: [phone] On-Call pager: [phone] 01/17/2019, 7:12 PM

## 2019-01-17 NOTE — Progress Notes (Signed)
Subjective: denies abdominal pain this morning. Sleeping. Objective:  Vital signs in last 24 hours: Vitals:   01/17/19 0911 01/17/19 1223 01/17/19 1531 01/17/19 1828  BP: (!) 143/97 (!) 140/92 (!) 147/92 (!) 151/94  Pulse: 95 78 78 95  Resp: 20 20 18 16   Temp: 99.8 F (37.7 C) 99.1 F (37.3 C) 99.5 F (37.5 C) 100.2 F (37.9 C)  TempSrc: Oral Oral Oral Oral  SpO2: 95% 97% 96% 94%  Weight:      Height:       Physical Exam: General: sleeping in bed. Awakens and answers questions Heart: RRR Lung sounds clear Abd: abd still distended but softer than yesterday. No abdominal pain on palpation  Summary In summary Philip Vasquez is a 58 year old gentleman who uses cocaine daily with no recorded past medical history who presented  left ACA/MCA watershed infarcts. His current deficits are right-sided weakness and dysarthria. The patient is currently medically stable and has been optimized for discharge. However, his disposition is unknown because he is not a candidate for CIR or SNF placement due to lack of insurance. He is homeless so home health is not an option.  Chronic/stable/resolved hosp problems  #AKIvs CKD?: Cr on admission 1.90, GFR 40. No baseline available. Renal function stable #Melena: Resolved.thought to be 2/2 anticoagulation. GI consulted 9/30. Suggested that bleed likely from anticoag and no need for colonoscopy at this time.Signed off. Of note, pt has never had a colonoscopy.  Plan: PPI BID. will need to be set up for colonoscopy and GI appt upon d/c  #Dysarthria #R sided weakness #L ACA/MCA Infarcts:  significant right sided weakness. Neurology consulted and signed off. Pt also experiencing depression 2/2 to current situation. Poor sleep, poor appetite, poor motivation, low energy. RN notes that patient is now able to be transferred to the chair with assist of 2. Encouraged them to try to get him up into the chair a couple times a day. Plan - continue statin, Asprin,  plavix - continue PT/OT.  -prozac 20mg  for associated depression. Can increase to 40 in 1-2w if sx persist. Can consider adding on seroquel at night for poor appetite -seroquel to help with sleep  01/16/19: Abdominal pain. Began overnight. Periumbilical. No n/v. Passing flatus.  Tmax 99.7. Last BM 10/8. Urinating ok. Minimal urine on bladder scan this morning. Obstruction vs ischemic bowel vs gastritis vs UTI. Of note, did have melena 2 weeks ago. UA + for hgb--likely from pulling foley out x2 KUB unremarkable Resolved today 10/11   Assessment/Plan:  Principal Problem:   Cerebral thrombosis with cerebral infarction Active Problems:   Right sided weakness   Cocaine use disorder (HCC)   Hypertension   Hyperlipidemia   Ulcer of penis   Urinary retention   Depression   Generalized abdominal pain  Skin ulcers. Located on the dorsal surface of the penis and posterior scrotum. Some drainage from posterior lesion. Does not appear infectious at this point.  Plan Frequent repositioning Barrier cream Air mattress  #HTN: continue losartan, metoprolol, amlodipine, hydralizine, labetalol. Losartan increased to 50mg  yesterday. Will recheck renal function in a few days.  #Poor appetite and likely malnutrition. Nursing notes indicate poor caloric intake. Pt refusing to eat much. Nursing offering assistance with eating. Hoping this will improve with antidepressant.   #Intermittent Urinary retention. Had ok output with condom cath however had to remove due to skin ulcers.  Bladder scan on 10/8 +524mL--pt had noted no need to urinate. Attempted foley a few days ago however  pt had ripped in out x2. Plan Frequent bladder scans Will replace condom cath today  #DVT prophylaxis. Holding due to melena and risk for rebleeding  #Dispo: discharge pending CIR or SNF placement. Medicaid app pending. Appreciate SW/CM work with this Will need follow up with neuro in 6w post d/c  Philip Hansen, MD  01/17/19 6:38 PM

## 2019-01-17 NOTE — Significant Event (Incomplete)
Rapid Response Event Note ? ?Overview: Bleeding ?  ? Called by RN, patient bleeding from urethra post I/O cath ? ?Initial Focused Assessment: ?Patient laying in bed, left leg kicking/ shaking, appears in pain.  Bleeding bright red blood from penis, constant stream.  Per RN, I/O cath for bladder scan 222ml- output was frank bright red blood approx 264ml. ?IMTS paged - stat CBC ordered ? ?-- patient appears in pain, unable to communicate r/t stroke residuals.   ? ?Interventions: ?-- CBC drawn ?-- IMTS called by RRT, asked to come see patient ?-- Order also placed for Renal U/S  ? ?-- wash cloths applied around penis  ? ?Plan of Care (if not transferred): ?-- IMTS at bedside ? ? ?Event Summary: ?End Call Time: 1850  ?  ? ?Philip Vasquez ?

## 2019-01-18 ENCOUNTER — Inpatient Hospital Stay (HOSPITAL_COMMUNITY): Payer: Medicaid Other

## 2019-01-18 ENCOUNTER — Other Ambulatory Visit (HOSPITAL_COMMUNITY): Payer: Self-pay

## 2019-01-18 LAB — BASIC METABOLIC PANEL
Anion gap: 10 (ref 5–15)
BUN: 13 mg/dL (ref 6–20)
CO2: 22 mmol/L (ref 22–32)
Calcium: 8.9 mg/dL (ref 8.9–10.3)
Chloride: 106 mmol/L (ref 98–111)
Creatinine, Ser: 1.34 mg/dL — ABNORMAL HIGH (ref 0.61–1.24)
GFR calc Af Amer: >60 mL/min (ref >60)
GFR calc non Af Amer: 58 mL/min — ABNORMAL LOW (ref >60)
Glucose, Bld: 113 mg/dL — ABNORMAL HIGH (ref 70–99)
Potassium: 4 mmol/L (ref 3.5–5.1)
Sodium: 138 mmol/L (ref 135–145)

## 2019-01-18 LAB — CBC
HCT: 29.9 % — ABNORMAL LOW (ref 39.0–52.0)
Hemoglobin: 9.7 g/dL — ABNORMAL LOW (ref 13.0–17.0)
MCH: 29.1 pg (ref 26.0–34.0)
MCHC: 32.4 g/dL (ref 30.0–36.0)
MCV: 89.8 fL (ref 80.0–100.0)
Platelets: 290 10*3/uL (ref 150–400)
RBC: 3.33 MIL/uL — ABNORMAL LOW (ref 4.22–5.81)
RDW: 12.2 % (ref 11.5–15.5)
WBC: 9.7 10*3/uL (ref 4.0–10.5)
nRBC: 0 % (ref 0.0–0.2)

## 2019-01-18 LAB — LACTIC ACID, PLASMA: Lactic Acid, Venous: 0.7 mmol/L (ref 0.5–1.9)

## 2019-01-18 MED ORDER — IOHEXOL 300 MG/ML  SOLN
125.0000 mL | Freq: Once | INTRAMUSCULAR | Status: AC | PRN
  Administered 2019-01-18: 125 mL via INTRAVENOUS

## 2019-01-18 MED ORDER — TAMSULOSIN HCL 0.4 MG PO CAPS
0.4000 mg | ORAL_CAPSULE | Freq: Every day | ORAL | Status: DC
  Administered 2019-01-18 – 2019-01-22 (×5): 0.4 mg via ORAL
  Filled 2019-01-18 (×5): qty 1

## 2019-01-18 NOTE — Progress Notes (Signed)
PT Cancellation Note  Patient Details Name: Philip Vasquez MRN: [id number] DOB: 27-Oct-1960   Cancelled Treatment:    Reason Eval/Treat Not Completed: (P) Patient declined, no reason specified(Pt reports no to paricipating in therapy despite education and encouragement.  He even refused to stay in bed and just perform LE strengthening.  Plan next session to see in am as he appears to do better in am.)   Bentlie Catanzaro J Sharne Linders 01/18/2019, 5:00 PM  Governor Rooks, PTA Acute Rehabilitation Services Pager [phone] Office [phone]

## 2019-01-18 NOTE — Progress Notes (Signed)
  Speech Language Pathology Treatment: Dysphagia;Cognitive-Linquistic  Patient Details Name: Philip Vasquez MRN: [id number] DOB: 1960/08/13 Today's Date: 01/18/2019 Time: 7829-5621 SLP Time Calculation (min) (ACUTE ONLY): 15 min  Assessment / Plan / Recommendation Clinical Impression  Pt was encountered awake/alert, lying semi-reclined in bed and he was agreeable to ST treatment session.  Treatment focused on dysphagia and cognitive deficits.  Pt reported that he was tolerating his current diet without coughing/choking or other difficulty.  Pt was observed with trials of thin liquid via straw sip and regular solids.  Pt exhibited good bolus acceptance and timely swallow initiation per observation and palpation with all trials.  He exhibited mildly prolonged, but effective mastication of regular solids and no oral residue was observed.  Additionally, no clinical s/sx of aspiration were observed with any trials.  Recommend continuation of regular solids and thin liquid with full supervision to encourage use of compensatory strategies.  Pt has met his dysphagia goal at this time.    Pt additionally completed safety judgement and numeric problem solving questions during this tx session.  Pt answered basic safety judgment questions with 3/3 accuracy and he answered complex safety judgement questions with 2/4 accuracy independently, improving to 4/4 accuracy given min-mod verbal cues.  Pt answered money-based numeric problem solving questions with 5/5 accuracy independently.  Pt exhibited overall good attention to task; however he benefited from environmental controls (turning off TV, closing door to reduce distractions, etc.) to aid in increased sustained attention throughout the session.  Recommend continuation of skilled ST targeting cognitive-linguistic deficits.     HPI HPI: Pt is a 58 year old gentleman with no past medical history who presented with AMS and right-sided weakness. MRI: Widely scattered  small acute infarcts in the medial lefthemisphere involving left ACA and left ACA/MCA watershed area. UA positive for cocaine use.       SLP Plan  Continue with current plan of care       Recommendations  Diet recommendations: Regular;Thin liquid Liquids provided via: Cup;Straw Medication Administration: Whole meds with puree Supervision: Full supervision/cueing for compensatory strategies;Staff to assist with self feeding Compensations: Slow rate;Small sips/bites;Minimize environmental distractions;Monitor for anterior loss Postural Changes and/or Swallow Maneuvers: Seated upright 90 degrees;Upright 30-60 min after meal                Oral Care Recommendations: Oral care BID Follow up Recommendations: 24 hour supervision/assistance;Inpatient Rehab SLP Visit Diagnosis: Dysphagia, unspecified (R13.10);Cognitive communication deficit (R41.841) Plan: Continue with current plan of care       Bretta Bang, M.S., [place] Office: [phone]               [place] 01/18/2019, 11:44 AM

## 2019-01-18 NOTE — Progress Notes (Signed)
Subjective:  Noted to have episode of uretheral bleeding after straight cath last evening. Pt denies pain this morning. Discussed likely cause of bleeding to be 2/2 his pulling foley out x2.   Objective:  Vital signs in last 24 hours: Vitals:   01/17/19 1828 01/17/19 2028 01/18/19 0124 01/18/19 0443  BP: (!) 151/94 (!) 135/96 (!) 145/102 125/88  Pulse: 95 89 81 77  Resp: 16 16 16 16   Temp: 100.2 F (37.9 C) 98.6 F (37 C) (!) 100.4 F (38 C) 98.6 F (37 C)  TempSrc: Oral Oral Oral Oral  SpO2: 94% 98% 97% 100%  Weight:  127 kg  132 kg  Height:       Physical Exam: General: in no distress Heart: RRR Lungs: clear to auscultation. Mild bibasilar crackles. GU: no blood on urethral meatus. Psych: flat affect  Summary In summary [name] is a 58 year old gentleman who uses cocaine daily with no recorded past medical history who presented  left ACA/MCA watershed infarcts. His current deficits are right-sided weakness and dysarthria. The patient is currently medically stable and has been optimized for discharge. However, his disposition is unknown because he is not a candidate for CIR or SNF placement due to lack of insurance. He is homeless so home health is not an option.  Chronic/stable/resolved hosp problems  #AKIvs CKD?: Cr on admission 1.90, GFR 40. No baseline available. Renal function stable #Melena: Resolved.thought to be 2/2 anticoagulation. GI consulted 9/30. Suggested that bleed likely from anticoag and no need for colonoscopy at this time.Signed off. Of note, pt has never had a colonoscopy.  Plan: PPI BID. will need to be set up for colonoscopy and GI appt upon d/c  RESOLVED Skin ulcers. Located on the dorsal surface of the penis and posterior scrotum. Some drainage from posterior lesion. Does not appear infectious at this point.  Plan Frequent repositioning Barrier cream Air mattress  #Poor appetite and likely malnutrition. Nursing notes indicate poor caloric  intake. Pt refusing to eat much. Nursing offering assistance with eating. Hoping this will improve with antidepressant.   #Intermittent Urinary retention. Had ok output with condom cath however had to remove due to skin ulcers.  Bladder scan on 10/8 +573mL--pt had noted no need to urinate. Attempted foley a few days ago however pt had ripped in out x2. Plan Frequent bladder scans Condom cath  Assessment/Plan:  Principal Problem:   Cerebral thrombosis with cerebral infarction Active Problems:   Right sided weakness   Cocaine use disorder ([place])   Hypertension   Hyperlipidemia   Ulcer of penis   Urinary retention   Depression   Generalized abdominal pain  Uretheral bleeding. Noted last pm following straight cath. Moderate bleeding. Likely related to him pulling his foley out x2 a couple of days ago. hgb stable. No cause found on renal US. CT AP read pending. Repeat hgb this morning stable. Plan Will continue to monitor  Infection r/o. Tmax 100.4 overnight. No leukocytosis. LA normal. Plan BC x2 pending. UC pending.  #Dysarthria #R sided weakness #L ACA/MCA Infarcts:  significant right sided weakness. Neurology consulted and signed off. Pt also experiencing depression 2/2 to current situation. Plan - continue statin, Asprin. D/c plavix as he has completed his 3w dose  - continue PT/OT.  -prozac 20mg  for associated depression. Can increase to 40 in a week or so. seroquel   #HTN: continue losartan, metoprolol, amlodipine, hydralizine, labetalol. Losartan increased to 50mg . Slight bump in Cr from 2d ago.likely related to losartan  increase. Will continue to monitor.  #DVT prophylaxis. Holding due to melena and risk for rebleeding  #Dispo: discharge pending CIR or SNF placement. Medicaid app pending. Appreciate SW/CM work with this Will need follow up with neuro in 6w post d/c  Elige Radon, MD 01/18/19 5:29 AM

## 2019-01-18 NOTE — Consult Note (Signed)
[place] Nurse wound follow up Wound type: MDRPI to penis is now healed. Measurement: N/A Wound bed: N/A Drainage (amount, consistency, odor) N/A Periwound: N/A Dressing procedure/placement/frequency: Patient continues to be incontinent of urine and is wearing adult briefs to manage this condition. Nursing techs are using perineal skin cleanser and moisture barrier ointment with each brief change/incontinent episode.  [place] nursing team will not follow, but will remain available to this patient, the nursing and medical teams.  Please re-consult if needed. Thanks, Maudie Flakes, MSN, RN, [place], Arther Abbott  Pager# [phone]

## 2019-01-19 DIAGNOSIS — R319 Hematuria, unspecified: Secondary | ICD-10-CM | POA: Diagnosis not present

## 2019-01-19 LAB — CBC
HCT: 29.8 % — ABNORMAL LOW (ref 39.0–52.0)
Hemoglobin: 10 g/dL — ABNORMAL LOW (ref 13.0–17.0)
MCH: 29.7 pg (ref 26.0–34.0)
MCHC: 33.6 g/dL (ref 30.0–36.0)
MCV: 88.4 fL (ref 80.0–100.0)
Platelets: 273 10*3/uL (ref 150–400)
RBC: 3.37 MIL/uL — ABNORMAL LOW (ref 4.22–5.81)
RDW: 12.1 % (ref 11.5–15.5)
WBC: 7.9 10*3/uL (ref 4.0–10.5)
nRBC: 0 % (ref 0.0–0.2)

## 2019-01-19 LAB — HEMOGLOBIN AND HEMATOCRIT, BLOOD
HCT: 29 % — ABNORMAL LOW (ref 39.0–52.0)
HCT: 29.9 % — ABNORMAL LOW (ref 39.0–52.0)
Hemoglobin: 9.9 g/dL — ABNORMAL LOW (ref 13.0–17.0)
Hemoglobin: 9.8 g/dL — ABNORMAL LOW (ref 13.0–17.0)

## 2019-01-19 LAB — URINE CULTURE: Culture: NO GROWTH

## 2019-01-19 MED ORDER — LIDOCAINE HCL URETHRAL/MUCOSAL 2 % EX GEL
1.0000 "application " | Freq: Once | CUTANEOUS | Status: AC
  Administered 2019-01-19: 1 via URETHRAL
  Filled 2019-01-19: qty 20

## 2019-01-19 MED ORDER — CHLORHEXIDINE GLUCONATE CLOTH 2 % EX PADS
6.0000 | MEDICATED_PAD | Freq: Every day | CUTANEOUS | Status: DC
  Administered 2019-01-20 – 2019-02-17 (×23): 6 via TOPICAL

## 2019-01-19 NOTE — Progress Notes (Signed)
Physical Therapy Treatment Patient Details Name: Philip Vasquez MRN: [id number] DOB: 1960/09/28 Today's Date: 01/19/2019    History of Present Illness Mr. Buenaventura is a 58 year old gentleman with no past medical history who presented with AMS and right-sided weakness. MRI: Widely scattered small acute infarcts in the medial lefthemisphere involving left ACA and left ACA/MCA watershed area.    PT Comments    PTA performed gt training and functional mobility to perform small steps from bed to recliner with use of RW.  He is able to grip hand grips bilaterally.  Pt continues to require +2 external assistance to mobilize.  Based on his level of assistance needed he continues to require long term snf placement for rehabilitation.  Plan next session for progression of gt training with close chair follow.     Follow Up Recommendations  Supervision/Assistance - 24 hour;SNF     Equipment Recommendations  (defer to post acute)    Recommendations for Other Services       Precautions / Restrictions Precautions Precautions: Fall Precaution Comments: apneic, R UE/LE weakness Restrictions Weight Bearing Restrictions: No    Mobility  Bed Mobility Overal bed mobility: Needs Assistance Bed Mobility: Rolling Rolling: Min assist;Max assist(Min to roll L and max to Roll R.) Sidelying to sit: Mod assist       General bed mobility comments: Pty able to roll to the L with decreased assistance but required heavy max to roll to the R.  Pt is receptive to session and participating and following commands better.  He required use of PTA as a railing to elevate trunk into sitting.  PTA facilitating R foot weight bearing to pull L side of trunk into sitting.  Transfers Overall transfer level: Needs assistance Equipment used: Rolling walker (2 wheeled) Transfers: Sit to/from [facility] Sit to Stand: Max assist;+2 physical assistance Stand pivot transfers: Max assist;+2 physical  assistance       General transfer comment: Strong lateral lean to the R.  Pt required cues for hand placement, foot placement and forward weight shifting to achieve standing.  Pt performed stand pivot leading with LLE and dragging R foot to for sidesteps to the L.  Ambulation/Gait Ambulation/Gait assistance: Max assist;+2 physical assistance Gait Distance (Feet): 4 Feet(sidestepping to the L.) Assistive device: Rolling walker (2 wheeled) Gait Pattern/deviations: Step-to pattern;Antalgic;Trunk flexed     General Gait Details: Pt performed lateral steps from bed to recliner to the L.  Cues for L foot advancement and R foot to.  Noted dragging R foot to.   Stairs             Wheelchair Mobility    Modified Rankin (Stroke Patients Only) Modified Rankin (Stroke Patients Only) Pre-Morbid Rankin Score: Moderate disability Modified Rankin: Moderately severe disability     Balance Overall balance assessment: Needs assistance Sitting-balance support: Single extremity supported;Bilateral upper extremity supported;Feet supported Sitting balance-Leahy Scale: Poor Sitting balance - Comments: Min to moderate assistance to maintain sitting balance. Postural control: Right lateral lean Standing balance support: Bilateral upper extremity supported;During functional activity Standing balance-Leahy Scale: Poor Standing balance comment: Heavy R lateral lean with posterior bias.  Reliant on B UE support.                            Cognition Arousal/Alertness: Awake/alert Behavior During Therapy: Flat affect;WFL for tasks assessed/performed Overall Cognitive Status: Impaired/Different from baseline Area of Impairment: Following commands;Safety/judgement;Problem solving  Orientation Level: Disoriented to;Time;Situation;Place Current Attention Level: Sustained Memory: Decreased recall of precautions;Decreased short-term memory Following Commands: Follows  multi-step commands inconsistently;Follows one step commands with increased time   Awareness: Intellectual Problem Solving: Difficulty sequencing;Requires verbal cues;Requires tactile cues;Slow processing General Comments: Pt more agreeable this am.  He was able to stand and bear weight for pivot from bed to recliner.      Exercises      General Comments        Pertinent Vitals/Pain Pain Assessment: Faces Faces Pain Scale: No hurt    Home Living                      Prior Function            PT Goals (current goals can now be found in the care plan section) Acute Rehab PT Goals Patient Stated Goal: pt unable to state due to expressive difficulties and lethargy Potential to Achieve Goals: Fair    Frequency    Min 4X/week      PT Plan Current plan remains appropriate    Co-evaluation              AM-PAC PT "6 Clicks" Mobility   Outcome Measure  Help needed turning from your back to your side while in a flat bed without using bedrails?: A Lot Help needed moving from lying on your back to sitting on the side of a flat bed without using bedrails?: A Lot Help needed moving to and from a bed to a chair (including a wheelchair)?: Total Help needed standing up from a chair using your arms (e.g., wheelchair or bedside chair)?: Total Help needed to walk in hospital room?: Total Help needed climbing 3-5 steps with a railing? : Total 6 Click Score: 8    End of Session Equipment Utilized During Treatment: Gait belt Activity Tolerance: Patient tolerated treatment well Patient left: with call bell/phone within reach;in bed;with bed alarm set;with family/visitor present;with nursing/sitter in room Nurse Communication: Mobility status PT Visit Diagnosis: Other abnormalities of gait and mobility (R26.89);Hemiplegia and hemiparesis;Muscle weakness (generalized) (M62.81) Hemiplegia - Right/Left: Right     Time: 2542-7062 PT Time Calculation (min) (ACUTE ONLY):  32 min  Charges:  $Therapeutic Activity: 23-37 mins                     Joycelyn Rua, PTA Acute Rehabilitation Services Pager [phone] Office [phone]     Dorine Duffey Artis Delay 01/19/2019, 9:42 AM

## 2019-01-19 NOTE — Progress Notes (Addendum)
   Subjective:   Objective:  Vital signs in last 24 hours: Vitals:   01/18/19 1704 01/18/19 2023 01/18/19 2354 01/19/19 0403  BP: (!) 155/96 139/89 125/81 (!) 140/98  Pulse: (!) 105 (!) 108 77 75  Resp: 17 17 17 17   Temp: 99.6 F (37.6 C) 99.8 F (37.7 C) 98.3 F (36.8 C) 98.7 F (37.1 C)  TempSrc: Oral Oral Oral Oral  SpO2: 95% 92% 95% 100%  Weight:  132 kg    Height:       Physical Exam: General: in no distress. Sitting up in chair Heart: RRR Lungs clear to auscultation. Psych: flat affect  Summary In summary Philip Vasquez is a 58 year old gentleman who uses cocaine daily with no recorded past medical history who presented  left ACA/MCA watershed infarcts. His current deficits are right-sided weakness and dysarthria. The patient is currently medically stable and has been optimized for discharge. However, his disposition is unknown because he is not a candidate for CIR or SNF placement due to lack of insurance. He is homeless so home health is not an option.  Chronic/stable/resolved hosp problems  #AKIvs CKD?: Cr on admission 1.90, GFR 40. No baseline available. Renal function stable #Melena: Resolved.thought to be 2/2 anticoagulation. GI consulted 9/30. Suggested that bleed likely from anticoag and no need for colonoscopy at this time.Signed off. Of note, pt has never had a colonoscopy.  Plan: PPI BID. will need to be set up for colonoscopy and GI appt upon d/c  RESOLVED Skin ulcers. Located on the dorsal surface of the penis and posterior scrotum. Some drainage from posterior lesion. Does not appear infectious at this point.  Plan Frequent repositioning Barrier cream Air mattress  #Poor appetite and likely malnutrition. Nursing notes indicate poor caloric intake. Pt refusing to eat much. Nursing offering assistance with eating. Hoping this will improve with antidepressant.   #Intermittent Urinary retention. Had ok output with condom cath however had to remove due to skin  ulcers.  Bladder scan on 10/8 +564mL--pt had noted no need to urinate. Attempted foley a few days ago however pt had ripped in out x2. Plan Frequent bladder scans Condom cath  Uretheral bleeding. Noted last pm following straight cath. Moderate bleeding. Likely related to him pulling his foley out x2 a couple of days ago. hgb stable. No cause found on renal US. CT AP still pending however no significant reason for hematuria found on imaging. Pending due to looking for comparison to previous imaging to compare retroperiotoneal fibrosis progression. Plan Will continue to monitor  #HTN: continue losartan, metoprolol, amlodipine, hydralizine.     Assessment/Plan:  Principal Problem:   Cerebral thrombosis with cerebral infarction Active Problems:   Right sided weakness   Cocaine use disorder ([facility])   Hypertension   Hyperlipidemia   Ulcer of penis   Urinary retention   Depression   Generalized abdominal pain  #Dysarthria #R sided weakness #L ACA/MCA Infarcts:  significant right sided weakness. Neurology consulted and signed off. Pt also experiencing depression 2/2 to current situation. Seems that his mood may be improving a bit. Appetite improving. Worked with therapy yesterday. Plan - continue statin, Asprin - continue PT/OT.  -continue prozac and seroquel   #DVT prophylaxis. Holding due to melena and risk for rebleeding  #Dispo: discharge pending CIR or SNF placement. Medicaid app pending. Appreciate SW/CM work with this Will need follow up with neuro in 6w post d/c  Mitzi Hansen, MD 01/19/19 5:18 AM

## 2019-01-19 NOTE — Progress Notes (Addendum)
Nutrition Follow-up  DOCUMENTATION CODES:   Obesity unspecified  INTERVENTION:  ContinueEnsure Enlive po BID, each supplement provides 350 kcal and 20 grams of protein.  Continue 30 ml Prostat po BID, each supplement provides 100 kcal and 15 grams of protein.   Encourage adequate PO intake.  NUTRITION DIAGNOSIS:   Inadequate oral intake related to lethargy/confusion as evidenced by estimated needs(reported meal completion); improving  GOAL:   Patient will meet greater than or equal to 90% of their needs; progressing  MONITOR:   PO intake, Supplement acceptance, Skin, Weight trends, Labs, I & O's  REASON FOR ASSESSMENT:   Malnutrition Screening Tool    ASSESSMENT:   58 year old man with hyperlipidemia, hypertension, and cocaine use disorder admitted with right upper and lower extremity weakness admitted with scattered acute infarcts in the medial left ACA and MCA watershed distribution.  Meal completion has been 50-75%. Pt currently has Ensure and Prostat ordered and has been consuming them. RD to continue with current orders to aid in caloric and protein needs as well as in healing. Pt encouraged to eat his food at meals and to drink his supplements.    Labs and medications reviewed.   Diet Order:   Diet Order            Diet regular Room service appropriate? Yes with Assist; Fluid consistency: Thin  Diet effective now              EDUCATION NEEDS:   Not appropriate for education at this time  Skin:  Skin Assessment: Reviewed RN Assessment Skin Integrity Issues:: Other (Comment) Other: skin ulcers on the dorsal surface of the penis and posterior scrotum  Last BM:  10/13  Height:   Ht Readings from Last 1 Encounters:  12/19/18 5\' 9"  (1.753 m)    Weight:   Wt Readings from Last 1 Encounters:  01/18/19 132 kg    Ideal Body Weight:  72.7 kg  BMI:  Body mass index is 42.97 kg/m.  Estimated Nutritional Needs:   Kcal:  2000-2200  Protein:   100-115 grams  Fluid:  >/= 2 L/day    Corrin Parker, MS, RD, LDN Pager # [phone] After hours/ weekend pager # [phone]

## 2019-01-19 NOTE — Progress Notes (Signed)
EVENT NOTE  Paged by bedside RN regarding hematuria. Patient was noted to be retaining approx 51mL urine by bladder scan. Patient was subsequently straight cath'ed resulted in about 160mL of dark red blood. See media tab for photo. Patient continued to bleed and hemostasis achieved approximately 10 min later. Bladder scan following hemostasis still showed approximately 459mL of urine.  I assessed patient at bedside. Patient in no acute distress. Denies pain. Coagulated blood present at the tip of urethra.  Plan: called urology. Recommends 37F Caute Catheter which can be placed by rehab nursing staff. Will repeat h/h.   Mitzi Hansen, MD 01/19/19 1:02 PM

## 2019-01-20 DIAGNOSIS — R630 Anorexia: Secondary | ICD-10-CM

## 2019-01-20 DIAGNOSIS — R319 Hematuria, unspecified: Secondary | ICD-10-CM

## 2019-01-20 LAB — CBC WITH DIFFERENTIAL/PLATELET
Abs Immature Granulocytes: 0.03 10*3/uL (ref 0.00–0.07)
Basophils Absolute: 0 10*3/uL (ref 0.0–0.1)
Basophils Relative: 0 %
Eosinophils Absolute: 0.2 10*3/uL (ref 0.0–0.5)
Eosinophils Relative: 2 %
HCT: 28.3 % — ABNORMAL LOW (ref 39.0–52.0)
Hemoglobin: 9.5 g/dL — ABNORMAL LOW (ref 13.0–17.0)
Immature Granulocytes: 0 %
Lymphocytes Relative: 18 %
Lymphs Abs: 1.4 10*3/uL (ref 0.7–4.0)
MCH: 29.6 pg (ref 26.0–34.0)
MCHC: 33.6 g/dL (ref 30.0–36.0)
MCV: 88.2 fL (ref 80.0–100.0)
Monocytes Absolute: 0.5 10*3/uL (ref 0.1–1.0)
Monocytes Relative: 7 %
Neutro Abs: 5.6 10*3/uL (ref 1.7–7.7)
Neutrophils Relative %: 73 %
Platelets: 306 10*3/uL (ref 150–400)
RBC: 3.21 MIL/uL — ABNORMAL LOW (ref 4.22–5.81)
RDW: 12.1 % (ref 11.5–15.5)
WBC: 7.8 10*3/uL (ref 4.0–10.5)
nRBC: 0 % (ref 0.0–0.2)

## 2019-01-20 NOTE — Plan of Care (Signed)
  Problem: Education: Goal: Knowledge of General Education information will improve Description Including pain rating scale, medication(s)/side effects and non-pharmacologic comfort measures Outcome: Progressing   Problem: Health Behavior/Discharge Planning: Goal: Ability to manage health-related needs will improve Outcome: Progressing   Problem: Clinical Measurements: Goal: Ability to maintain clinical measurements within normal limits will improve Outcome: Progressing Goal: Will remain free from infection Outcome: Progressing Goal: Diagnostic test results will improve Outcome: Progressing Goal: Respiratory complications will improve Outcome: Progressing Goal: Cardiovascular complication will be avoided Outcome: Progressing   Problem: Activity: Goal: Risk for activity intolerance will decrease Outcome: Progressing   Problem: Nutrition: Goal: Adequate nutrition will be maintained Outcome: Progressing   Problem: Coping: Goal: Level of anxiety will decrease Outcome: Progressing   Problem: Elimination: Goal: Will not experience complications related to bowel motility Outcome: Progressing Goal: Will not experience complications related to urinary retention Outcome: Progressing   Problem: Pain Managment: Goal: General experience of comfort will improve Outcome: Progressing   Problem: Safety: Goal: Ability to remain free from injury will improve Outcome: Progressing   Problem: Skin Integrity: Goal: Risk for impaired skin integrity will decrease Outcome: Progressing   Problem: Education: Goal: Knowledge of disease or condition will improve Outcome: Progressing Goal: Knowledge of secondary prevention will improve Outcome: Progressing Goal: Knowledge of patient specific risk factors addressed and post discharge goals established will improve Outcome: Progressing Goal: Individualized Educational Video(s) Outcome: Progressing   Problem: Coping: Goal: Will verbalize  positive feelings about self Outcome: Progressing Goal: Will identify appropriate support needs Outcome: Progressing   Problem: Health Behavior/Discharge Planning: Goal: Ability to manage health-related needs will improve Outcome: Progressing   Problem: Self-Care: Goal: Ability to participate in self-care as condition permits will improve Outcome: Progressing Goal: Verbalization of feelings and concerns over difficulty with self-care will improve Outcome: Progressing Goal: Ability to communicate needs accurately will improve Outcome: Progressing   Problem: Nutrition: Goal: Risk of aspiration will decrease Outcome: Progressing Goal: Dietary intake will improve Outcome: Progressing   Problem: Ischemic Stroke/TIA Tissue Perfusion: Goal: Complications of ischemic stroke/TIA will be minimized Outcome: Progressing  Michele Kerlin, BSN, RN 

## 2019-01-20 NOTE — Progress Notes (Signed)
OT Cancellation Note  Patient Details Name: Philip Vasquez MRN: [id number] DOB: 1961-03-18   Cancelled Treatment:    Reason Eval/Treat Not Completed: Patient declined, no reason specified(Pt shaking head "no" after 5 mins of trying to work with him)  Per PTA, pt works better with therapy in the AM. Pt would not allow any therapy earlier this PM.  OT to continue to follow.   Ebony Hail Harold Hedge) Marsa Aris OTR/L Acute Rehabilitation Services Pager: [phone] Office: [place] 01/20/2019, 5:22 PM

## 2019-01-20 NOTE — Progress Notes (Signed)
   Subjective: no events overnight. Patient a little more talkative this morning.  Objective:  Vital signs in last 24 hours: Vitals:   01/19/19 2223 01/19/19 2327 01/20/19 0256 01/20/19 0337  BP: (!) 153/102 136/90  (!) 151/93  Pulse: 98 79  81  Resp:  18  17  Temp:  99.6 F (37.6 C)  97.9 F (36.6 C)  TempSrc:  Oral  Oral  SpO2:  97%    Weight:   113.4 kg   Height:       Physical Exam: General: in no distress. Chronically ill appearing Cardiac: RRR Lungs: bibasilar crackles. Other wise clear Neuro: patient actually a little more talkative this morning. Strength improving on right side as evidenced by handgrip. Skin: no rash  Summary In summary Mr. Strollo is a 58 year old gentleman who uses cocaine daily with no recorded past medical history who presented  left ACA/MCA watershed infarcts. His current deficits are right-sided weakness and dysarthria. The patient is currently medically stable and has been optimized for discharge. However, his disposition is unknown because he is not a candidate for CIR or SNF placement due to lack of insurance. He is homeless so home health is not an option.  Chronic/stable/resolved hosp problems  #AKIvs CKD?: Cr on admission 1.90, GFR 40. No baseline available. Renal function improved #Melena: Resolved.thought to be 2/2 anticoagulation. GI consulted 9/30. Suggested that bleed likely from anticoag and no need for colonoscopy at this time.Signed off. Of note, pt has never had a colonoscopy.  Plan: PPI BID. will need to be set up for colonoscopy and GI appt upon d/c  RESOLVED Skin ulcers. Located on the dorsal surface of the penis and posterior scrotum. Some drainage from posterior lesion. Does not appear infectious at this point.  Plan Frequent repositioning Barrier cream Air mattress  #Poor appetite and likely malnutrition. Nursing notes indicate poor caloric intake. Pt refusing to eat much. Nursing offering assistance with eating. Somewhat  improving  #HTN: continue losartan, metoprolol, amlodipine, hydralizine.   Assessment/Plan:  Principal Problem:   Cerebral thrombosis with cerebral infarction Active Problems:   Right sided weakness   Cocaine use disorder (HCC)   Hypertension   Hyperlipidemia   Ulcer of penis   Urinary retention   Depression   Generalized abdominal pain   Hematuria  #Dysarthria #R sided weakness #L ACA/MCA Infarcts:  significant right sided weakness. Neurology consulted and signed off. Pt also experiencing depression 2/2 to current situation. Seems that his mood may be improving a bit. Appetite improving. Worked with therapy yesterday. Plan - continue statin, Asprin - continue PT/OT.  -continue prozac and mirtazepine  Hematuria.  Likely 2/2 to uretheral trauma when he tore foley out x2 during this last week. Had significant episode of bleeding following straight cath yesterday. See my note for details. Called and discussed with urology. See my event note from 10/13. Recommended cauti cath. hgb stable.  #Urinary retention. Had ok output with condom cath however had to remove due to skin ulcers.  Bladder scan on 10/8 +534mL--pt had noted no need to urinate.  Plan Cauti catheter placed per urology recommendations flomax   #DVT prophylaxis. Holding due to melena and risk for rebleeding  #Dispo: discharge pending CIR or SNF placement. Medicaid app pending. Appreciate SW/CM work with this Will need follow up with neuro in 6w post d/c  Mitzi Hansen, MD 01/20/19 5:18 AM

## 2019-01-20 NOTE — Plan of Care (Signed)
  Problem: Education: Goal: Knowledge of General Education information will improve Description Including pain rating scale, medication(s)/side effects and non-pharmacologic comfort measures Outcome: Progressing   Problem: Health Behavior/Discharge Planning: Goal: Ability to manage health-related needs will improve Outcome: Progressing   Problem: Clinical Measurements: Goal: Ability to maintain clinical measurements within normal limits will improve Outcome: Progressing Goal: Will remain free from infection Outcome: Progressing Goal: Diagnostic test results will improve Outcome: Progressing Goal: Respiratory complications will improve Outcome: Progressing Goal: Cardiovascular complication will be avoided Outcome: Progressing   Problem: Activity: Goal: Risk for activity intolerance will decrease Outcome: Progressing   Problem: Nutrition: Goal: Adequate nutrition will be maintained Outcome: Progressing   Problem: Coping: Goal: Level of anxiety will decrease Outcome: Progressing   Problem: Elimination: Goal: Will not experience complications related to bowel motility Outcome: Progressing Goal: Will not experience complications related to urinary retention Outcome: Progressing   Problem: Pain Managment: Goal: General experience of comfort will improve Outcome: Progressing   Problem: Safety: Goal: Ability to remain free from injury will improve Outcome: Progressing   Problem: Skin Integrity: Goal: Risk for impaired skin integrity will decrease Outcome: Progressing   Problem: Education: Goal: Knowledge of disease or condition will improve Outcome: Progressing Goal: Knowledge of secondary prevention will improve Outcome: Progressing Goal: Knowledge of patient specific risk factors addressed and post discharge goals established will improve Outcome: Progressing Goal: Individualized Educational Video(s) Outcome: Progressing   Problem: Coping: Goal: Will verbalize  positive feelings about self Outcome: Progressing Goal: Will identify appropriate support needs Outcome: Progressing   Problem: Health Behavior/Discharge Planning: Goal: Ability to manage health-related needs will improve Outcome: Progressing   Problem: Self-Care: Goal: Ability to participate in self-care as condition permits will improve Outcome: Progressing Goal: Verbalization of feelings and concerns over difficulty with self-care will improve Outcome: Progressing Goal: Ability to communicate needs accurately will improve Outcome: Progressing   Problem: Nutrition: Goal: Risk of aspiration will decrease Outcome: Progressing Goal: Dietary intake will improve Outcome: Progressing   Problem: Ischemic Stroke/TIA Tissue Perfusion: Goal: Complications of ischemic stroke/TIA will be minimized Outcome: Progressing  Pamelyn Bancroft, BSN, RN 

## 2019-01-20 NOTE — Progress Notes (Signed)
Physical Therapy Treatment Patient Details Name: Philip Vasquez MRN: [id number] DOB: Nov 30, 1960 Today's Date: 01/20/2019    History of Present Illness Philip Vasquez is a 58 year old gentleman with no past medical history who presented with AMS and right-sided weakness. MRI: Widely scattered small acute infarcts in the medial lefthemisphere involving left ACA and left ACA/MCA watershed area.    PT Comments    Pt performed supine LE strengthening and ROM to R UE.  He appears to be in less pain when moving RUE.  He participated and followed commands well.  Plan next session for OOB mobility if patient is compliant.  He is unable to actively move RLE and slightly tight in his heel cords.     Follow Up Recommendations  Supervision/Assistance - 24 hour;SNF     Equipment Recommendations  (defer to post acute)    Recommendations for Other Services Rehab consult     Precautions / Restrictions Precautions Precautions: Fall Precaution Comments: apneic, R UE/LE weakness Restrictions Weight Bearing Restrictions: No    Mobility  Bed Mobility Overal bed mobility: Needs Assistance             General bed mobility comments: Bed placed in trendelenberg and pt able to boost in supine to [facility] with min assistance.  Bed then placed in chair position post session.  Transfers                    Ambulation/Gait                 Stairs             Wheelchair Mobility    Modified Rankin (Stroke Patients Only)       Balance       Sitting balance - Comments: Min to moderate assistance to maintain sitting balance.                                    Cognition Arousal/Alertness: Awake/alert Behavior During Therapy: Flat affect;WFL for tasks assessed/performed Overall Cognitive Status: Impaired/Different from baseline Area of Impairment: Following commands;Safety/judgement;Problem solving                 Orientation Level: Disoriented  to;Time;Situation;Place Current Attention Level: Sustained Memory: Decreased recall of precautions;Decreased short-term memory Following Commands: Follows multi-step commands inconsistently;Follows one step commands with increased time Safety/Judgement: Decreased awareness of safety;Decreased awareness of deficits Awareness: Intellectual Problem Solving: Difficulty sequencing;Requires verbal cues;Requires tactile cues;Slow processing General Comments: Pt more agreeable later this pm.  He was only agreeable to supine therapeutic exercises.      Exercises General Exercises - Lower Extremity Ankle Circles/Pumps: AROM;Both;10 reps;Supine;PROM Heel Slides: AROM;PROM;Both;10 reps;Supine Hip ABduction/ADduction: AROM;PROM;10 reps;Supine;Both Straight Leg Raises: AROM;Both;10 reps;Supine;PROM Shoulder Exercises Shoulder Flexion: Both;AROM;Self ROM;10 reps;Supine(hands clasped) Shoulder Extension: AROM;Self ROM;Both;10 reps;Supine(hand clasped.) Elbow Flexion: AAROM;Right;10 reps;Supine Elbow Extension: AAROM;Right;10 reps;Supine Wrist Extension: AROM;10 reps;Supine;Right Digit Composite Flexion: AROM;Right;10 reps;Supine Hand Exercises Forearm Supination: AAROM;Right;10 reps;Supine Forearm Pronation: AAROM;Right;10 reps;Supine    General Comments        Pertinent Vitals/Pain Pain Assessment: Faces Faces Pain Scale: Hurts little more Pain Location: R UE Pain Descriptors / Indicators: Grimacing Pain Intervention(s): Monitored during session;Repositioned    Home Living                      Prior Function            PT Goals (current goals can  now be found in the care plan section) Acute Rehab PT Goals Patient Stated Goal: pt unable to state due to expressive difficulties and lethargy Potential to Achieve Goals: Fair Progress towards PT goals: Progressing toward goals    Frequency    Min 4X/week      PT Plan Current plan remains appropriate    Co-evaluation               AM-PAC PT "6 Clicks" Mobility   Outcome Measure  Help needed turning from your back to your side while in a flat bed without using bedrails?: A Lot Help needed moving from lying on your back to sitting on the side of a flat bed without using bedrails?: A Lot Help needed moving to and from a bed to a chair (including a wheelchair)?: Total Help needed standing up from a chair using your arms (e.g., wheelchair or bedside chair)?: Total Help needed to walk in hospital room?: Total Help needed climbing 3-5 steps with a railing? : Total 6 Click Score: 8    End of Session   Activity Tolerance: Patient tolerated treatment well Patient left: in bed;with call bell/phone within reach;with bed alarm set Nurse Communication: Mobility status PT Visit Diagnosis: Other abnormalities of gait and mobility (R26.89);Hemiplegia and hemiparesis;Muscle weakness (generalized) (M62.81) Hemiplegia - Right/Left: Right     Time: 7494-4967 PT Time Calculation (min) (ACUTE ONLY): 13 min  Charges:  $Therapeutic Exercise: 8-22 mins                     Philip Vasquez, PTA Acute Rehabilitation Services Pager [phone] Office [phone]     Philip Vasquez 01/20/2019, 5:18 PM

## 2019-01-20 NOTE — Progress Notes (Signed)
PT Cancellation Note  Patient Details Name: Philip Vasquez MRN: [id number] DOB: [birth year]   Cancelled Treatment:    Reason Eval/Treat Not Completed: (P) Patient declined, no reason specified(Provided education and encouragement and patient continues to decline PT today.  Pt is limiting progress due to his behavior.  Will continue efforts per POC.)   Apple Dearmas J Stann Mainland 01/20/2019, 3:46 PM Governor Rooks, PTA Acute Rehabilitation Services Pager [phone] Office [phone]

## 2019-01-20 NOTE — Plan of Care (Signed)
Progressing towards goals

## 2019-01-21 MED ORDER — FLUOXETINE HCL 20 MG PO CAPS
40.0000 mg | ORAL_CAPSULE | Freq: Every day | ORAL | Status: DC
  Administered 2019-01-22 – 2019-02-25 (×35): 40 mg via ORAL
  Filled 2019-01-21 (×35): qty 2

## 2019-01-21 NOTE — Progress Notes (Signed)
PT Cancellation Note  Patient Details Name: Philip Vasquez MRN: [id number] DOB: [birth year]   Cancelled Treatment:    Reason Eval/Treat Not Completed: (P) Patient declined, no reason specified(Pt reports he is not doing anything.  Despite education and encouragement he repeatedly reports no.)   Ruthe Roemer J Stann Mainland 01/21/2019, 3:41 PM  Governor Rooks, PTA Acute Rehabilitation Services Pager [phone] Office [phone]

## 2019-01-21 NOTE — TOC Progression Note (Signed)
Transition of Care [facility]) - Progression Note    Patient Details  Name: Philip Vasquez MRN: [id number] Date of Birth: [birth year]  [facility] CM/SW Contact  Pollie Friar, RN Phone Number: 01/21/2019, 8:36 AM  Clinical Narrative:    Pt faxed out to [facility] per CSW management request. TOC following.   Expected Discharge Plan: IP Rehab Facility Barriers to Discharge: SNF Pending payor source - LOG, SNF Pending bed offer, Inadequate or no insurance, Active Substance Use - Placement  Expected Discharge Plan and Services Expected Discharge Plan: [place] In-house Referral: Clinical Social Work Discharge Planning Services: CM Consult                                           Social Determinants of Health (SDOH) Interventions    Readmission Risk Interventions No flowsheet data found.

## 2019-01-21 NOTE — Plan of Care (Signed)
Progressing towards goals

## 2019-01-21 NOTE — Progress Notes (Signed)
   Subjective: no overnight events. No complaints  Objective:  Vital signs in last 24 hours: Vitals:   01/21/19 0036 01/21/19 0431 01/21/19 0543 01/21/19 0830  BP: (!) 139/92 (!) 153/96  (!) 137/91  Pulse: 76 76  72  Resp: 18 17  16   Temp: 98.9 F (37.2 C) 98.3 F (36.8 C)  98.3 F (36.8 C)  TempSrc: Oral Oral  Oral  SpO2: 100% 98%  99%  Weight:   106.6 kg   Height:       Physical Exam: General: In no acute distress Heart: Regular rate and rhythm Lungs: Clear to auscultation Skin: No rash Neuro: Diffuse right-sided weakness that is slowly improving Psych: Affect is improving. Summary In summary Mr. Canady is a 58 year old gentleman who uses cocaine daily with no recorded past medical history who presented  left ACA/MCA watershed infarcts. His current deficits are right-sided weakness and dysarthria. The patient is currently medically stable and has been optimized for discharge. However, his disposition is unknown because he is not a candidate for CIR or SNF placement due to lack of insurance. He is homeless so home health is not an option.  Chronic/stable/resolved hosp problems  #AKIvs CKD?: Cr on admission 1.90, GFR 40. No baseline available. Renal function improved #Melena: Resolved.thought to be 2/2 anticoagulation. GI consulted 9/30. Suggested that bleed likely from anticoag and no need for colonoscopy at this time.Signed off. Of note, pt has never had a colonoscopy.  Plan: PPI BID. will need to be set up for colonoscopy and GI appt upon d/c  RESOLVED Skin ulcers. Located on the dorsal surface of the penis and posterior scrotum. Some drainage from posterior lesion. Does not appear infectious at this point.  Plan Frequent repositioning, Barrier cream, Air mattress  #Poor appetite and likely malnutrition. Nursing notes indicate poor caloric intake. Pt refusing to eat much. Nursing offering assistance with eating. Somewhat improving  #HTN: continue losartan, metoprolol,  amlodipine, hydralizine.   Assessment/Plan:  Principal Problem:   Cerebral thrombosis with cerebral infarction Active Problems:   Right sided weakness   Cocaine use disorder (HCC)   Hypertension   Hyperlipidemia   Ulcer of penis   Urinary retention   Depression   Generalized abdominal pain   Hematuria  #Dysarthria #R sided weakness #L ACA/MCA Infarcts:  significant right sided weakness. Neurology consulted and signed off. Pt also experiencing depression 2/2 to current situation. Seems that his mood may be improving a bit. Appetite improving. Worked with therapy yesterday. Plan - Continue statin and aspirin -Continue PT and OT - Increasing Prozac to 40 mg.  Continue Remeron  Urinary Retention/Hematuria. Likely result from patient pulling his Foley twice in the days preceding the hematuria.  Discussed case with urology who recommended Cauti cath placement to avoid further trauma from straight cath. Continue flomax  #DVT prophylaxis. Holding due to melena and risk for rebleeding  #Dispo: discharge pending CIR or SNF placement. Medicaid app pending. Appreciate SW/CM work with this Will need follow up with neuro in 6w post d/c  Mitzi Hansen, MD 01/21/19 10:19 AM

## 2019-01-22 NOTE — Progress Notes (Signed)
PT Cancellation Note  Patient Details Name: Philip Vasquez MRN: [id number] DOB: 11-12-1960   Cancelled Treatment:    Reason Eval/Treat Not Completed: Patient declined, no reason specified. PT attempted to see pt twice today, once at 841, upon which pt reports he will not mobilize at this time and maybe later in the afternoon. PT returns at 1234 and pt again refuses despite PT encouragement. Pt reports he is does not want to move and when asked for a reason he sates "because I said so". PT will follow up as time allows, however this is pt's third refusal in a row. If pt refuses for a 4th consecutive time PT will sign off.   Zenaida Niece 01/22/2019, 12:34 PM

## 2019-01-22 NOTE — Progress Notes (Signed)
SLP Cancellation Note  Patient Details Name: Philip Vasquez MRN: [id number] DOB: 11-30-1960   Cancelled treatment:        Attempted to see pt for ongoing cognitive therapy.  Pt refused treatment today.  Did not state a reason, but could not be encouraged to participate.   Celedonio Savage, MA, [place] Office: [phone]: Pager [phone]): [phone] 01/22/2019, 12:14 PM

## 2019-01-22 NOTE — Progress Notes (Signed)
   Subjective: denies pain. Watching tv  Objective:  Vital signs in last 24 hours: Vitals:   01/21/19 2359 01/22/19 0100 01/22/19 0508 01/22/19 0748  BP: 136/90  (!) 141/86 131/85  Pulse: 73  78 81  Resp: 17  18 16   Temp: 98.8 F (37.1 C)  98.6 F (37 C) 98.8 F (37.1 C)  TempSrc: Oral  Oral Axillary  SpO2: 98%  100% 97%  Weight:  128.4 kg    Height:       Physical Exam: General: No acute distress Cardiac: Regular rate and rhythm Pulmonary: Lung sounds clear to auscultation Abdomen: Bowel sounds active.  No change in abdominal distention. Neurological: Right straight sided strength improving Psych: Mood improving Summary In summary Philip Vasquez is a 58 year old gentleman who uses cocaine daily with no recorded past medical history who presented  left ACA/MCA watershed infarcts. His current deficits are right-sided weakness and dysarthria. The patient is currently medically stable and has been optimized for discharge. However, his disposition is unknown because he is not a candidate for CIR or SNF placement due to lack of insurance. He is homeless so home health is not an option.  Chronic/stable/resolved hosp problems  RESOLVED Melena: Resolved.thought to be 2/2 anticoagulation. GI consulted 9/30. Suggested that bleed likely from anticoag and no need for colonoscopy at this time.Signed off. Of note, pt has never had a colonoscopy.  Plan: PPI BID. will need to be set up for colonoscopy and GI appt upon d/c  RESOLVED Skin ulcers. Located on the dorsal surface of the penis and posterior scrotum. Some drainage from posterior lesion. Does not appear infectious at this point.  Plan Frequent repositioning, Barrier cream, Air mattress  #Poor appetite and likely malnutrition. Nursing notes indicate poor caloric intake. Pt refusing to eat much. Nursing offering assistance with eating. Somewhat improving  #HTN: continue losartan, metoprolol, amlodipine, hydralizine.   Assessment/Plan:   Principal Problem:   Cerebral thrombosis with cerebral infarction Active Problems:   Right sided weakness   Cocaine use disorder (HCC)   Hypertension   Hyperlipidemia   Ulcer of penis   Urinary retention   Depression   Generalized abdominal pain   Hematuria  #Dysarthria #R sided weakness #L ACA/MCA Infarcts:  significant right sided weakness. Neurology consulted and signed off. Pt also experiencing depression 2/2 to current situation. Seems that his mood may be improving a bit. Appetite improving.  Still not working with therapy consistently. Plan - Continue statin and aspirin -Continue PT and OT - Continue Prozac 40 mg and Remeron  Urinary Retention/Hematuria. Likely result from patient pulling his Foley twice in the days preceding the hematuria.  Discussed case with urology who recommended Cauti cath placement to avoid further trauma from straight cath.  Having good output.  Will discontinue Flomax  #DVT prophylaxis. Holding due to melena and risk for rebleeding  #Dispo: discharge pending CIR or SNF placement. Medicaid app pending. Appreciate SW/CM work with this.  Looks like absent been sent and just waiting on Medicaid. Will need follow up with neuro in 6w post d/c  Sussan Meter, MD 01/22/19 9:23 AM

## 2019-01-23 DIAGNOSIS — Z79899 Other long term (current) drug therapy: Secondary | ICD-10-CM

## 2019-01-23 DIAGNOSIS — F1990 Other psychoactive substance use, unspecified, uncomplicated: Secondary | ICD-10-CM

## 2019-01-23 DIAGNOSIS — Z590 Homelessness: Secondary | ICD-10-CM

## 2019-01-23 DIAGNOSIS — R339 Retention of urine, unspecified: Secondary | ICD-10-CM

## 2019-01-23 DIAGNOSIS — F329 Major depressive disorder, single episode, unspecified: Secondary | ICD-10-CM

## 2019-01-23 LAB — CULTURE, BLOOD (ROUTINE X 2)
Culture: NO GROWTH
Culture: NO GROWTH
Special Requests: ADEQUATE
Special Requests: ADEQUATE

## 2019-01-23 NOTE — TOC Progression Note (Signed)
Transition of Care [facility]) - Progression Note    Patient Details  Name: Philip Vasquez MRN: [id number] Date of Birth: 01-14-1961  Transition of Care [facility]) CM/SW [place], [place] Phone Number: 01/23/2019, 11:57 AM  Clinical Narrative:     CSW called and spoke with Clarene Critchley with [facility]. She stated that she has received the referral and is working the patient up. She has sent the referral to her [facility] office and bed pending acceptance.   CSW will continue to follow.    Expected Discharge Plan: IP Rehab Facility Barriers to Discharge: SNF Pending payor source - LOG, SNF Pending bed offer, Inadequate or no insurance, Active Substance Use - Placement  Expected Discharge Plan and Services Expected Discharge Plan: [place] In-house Referral: Clinical Social Work Discharge Planning Services: CM Consult                                           Social Determinants of Health (SDOH) Interventions    Readmission Risk Interventions No flowsheet data found.

## 2019-01-23 NOTE — Progress Notes (Addendum)
   Subjective:  Mr. Prom is feeling ok this morning. He states he has not had much of an appetite. He does not want to work much with SLP or PT although this was encouraged so he can work on keep his strength. He denies shortness of breath, constipation or abdominla pain.  Objective:  Vital signs in last 24 hours: Vitals:   01/22/19 2004 01/22/19 2349 01/23/19 0400 01/23/19 0500  BP: (!) 144/86 127/76 128/64   Pulse: 85 72 83   Resp: 15 18 18    Temp: 98.4 F (36.9 C) 99.2 F (37.3 C) 98.2 F (36.8 C)   TempSrc: Oral Oral Oral   SpO2: 98% 99% 99%   Weight:    128.5 kg  Height:       Constitution: NAD, supine in bed Cardio: RRR, no m/r/g Respiratory: CTA, no w/r/r Abd: NTTP, non-distended GU: catheter in place, urine yellow Neuro: RU & RLE weakness  Skin: c/d/i   Assessment/Plan:  Principal Problem:   Cerebral thrombosis with cerebral infarction Active Problems:   Right sided weakness   Cocaine use disorder (HCC)   Hypertension   Hyperlipidemia   Ulcer of penis   Urinary retention   Depression   Generalized abdominal pain   Hematuria  58 year old gentleman w/PMH polysubstance use, homelessness, no other known medical history, who presented with left ACA/MCA watershed infarcts. His current deficits are right-sided weakness and dysarthria. Medically stable and has been optimized for discharge. However, his disposition is unknown because he is not a candidate for CIR or SNF placement due to lack of insurance.   L ACA/MCA Infarct Residual dysarthria & right sided weakness. Completed plavix and now on asa alone. Discharge pending medicaid application otherwise stable for discharge.   - cont. Asa  - cont. crestor 40 mg qd - cont. PT, SLP, OT - d/c fluids  - am BMP  - cont. Ensure, thiamine, multivitamins  Depression He has been intermittently refusing PT and SLP. He has endorsed some depression since his recent stroke and had decreased po intake and appetite  -  continuing to encourage eating  - cont. remeron & prozac  Urinary Retention Neurogenic vs. Bph. Pulled out foley x2 then I&O caths began causing hematuria. Coude cath placed.   - cont. Coude cath - cont. flomax  Hypertension Continue amlodipine 10 mg qd, hydral 75 mg tid, losartan 50 mg qd, metop 25 mg bid.   VTE: lovenox IVF: 50cc/hr NS Diet: regular  Code: full   Dispo: Anticipated discharge pending placement.   Molli Hazard A, DO 01/23/2019, 7:04 AM Pager: [phone]

## 2019-01-24 LAB — BASIC METABOLIC PANEL
Anion gap: 11 (ref 5–15)
BUN: 15 mg/dL (ref 6–20)
CO2: 23 mmol/L (ref 22–32)
Calcium: 9.4 mg/dL (ref 8.9–10.3)
Chloride: 101 mmol/L (ref 98–111)
Creatinine, Ser: 1.26 mg/dL — ABNORMAL HIGH (ref 0.61–1.24)
GFR calc Af Amer: >60 mL/min (ref >60)
GFR calc non Af Amer: >60 mL/min (ref >60)
Glucose, Bld: 107 mg/dL — ABNORMAL HIGH (ref 70–99)
Potassium: 4.2 mmol/L (ref 3.5–5.1)
Sodium: 135 mmol/L (ref 135–145)

## 2019-01-24 NOTE — Progress Notes (Signed)
   Subjective: denies pain. Watching tv  Objective:  Vital signs in last 24 hours: Vitals:   01/23/19 2010 01/24/19 0039 01/24/19 0500 01/24/19 0606  BP: (!) 145/83 (!) 150/92  (!) 155/93  Pulse: 72 76  81  Resp: 18 20  18   Temp: 98.7 F (37.1 C) 98.6 F (37 C)  98.4 F (36.9 C)  TempSrc: Oral Oral  Oral  SpO2: 98% 100%  96%  Weight:   127.9 kg   Height:       Physical Exam: General: In no acute distress Cardiac heart regular rate and rhythm Pulmonary: Lung sounds clear to auscultation Neuro: Right-sided weakness present but appears to be improving Psych: Mood appears to be improving Summary In summary Philip Vasquez is a 58 year old gentleman who uses cocaine daily with no recorded past medical history who presented  left ACA/MCA watershed infarcts. His current deficits are right-sided weakness and dysarthria. The patient is currently medically stable and has been optimized for discharge. However, his disposition is unknown because he is not a candidate for CIR or SNF placement due to lack of insurance. He is homeless so home health is not an option.  Chronic/stable/resolved hosp problems  RESOLVED Melena: Resolved.thought to be 2/2 anticoagulation. GI consulted 9/30. Suggested that bleed likely from anticoag and no need for colonoscopy at this time.Signed off. Of note, pt has never had a colonoscopy.  Plan: PPI BID. will need to be set up for colonoscopy and GI appt upon d/c  RESOLVED Skin ulcers. Located on the dorsal surface of the penis and posterior scrotum. Some drainage from posterior lesion. Does not appear infectious at this point.  Plan Frequent repositioning, Barrier cream, Air mattress  #Poor appetite and likely malnutrition. Nursing notes indicate poor caloric intake. Pt refusing to eat much. Nursing offering assistance with eating. Somewhat improving  #HTN: continue losartan, metoprolol, amlodipine, hydralizine.   Assessment/Plan:  Principal Problem:   Cerebral  thrombosis with cerebral infarction Active Problems:   Right sided weakness   Cocaine use disorder (HCC)   Hypertension   Hyperlipidemia   Ulcer of penis   Urinary retention   Depression   Generalized abdominal pain   Hematuria  #Dysarthria #R sided weakness #L ACA/MCA Infarcts>>related depression:  significant right sided weakness. Neurology consulted and signed off. Pt also experiencing depression 2/2 to current situation. Seems that his mood may be improving a bit. Appetite improving.  Still not working with therapy consistently. Plan - Continue statin and aspirin -Continue PT and OT - Continue Prozac 40 mg and Remeron  Urinary Retention/Hematuria. Nephrogenic vs BPH. Continue Cauti cath.   #DVT prophylaxis. Holding due to melena and risk for rebleeding  #Dispo: CIR referral placed with [facility]. Sent referral to [facility] and bed pending acceptance.CM work with this.Will need follow up with neuro in 6w post d/c  Judd Mccubbin, MD 01/24/19 8:13 AM

## 2019-01-25 NOTE — Plan of Care (Signed)
Max assist adls 

## 2019-01-25 NOTE — Progress Notes (Signed)
PT Cancellation Note  Patient Details Name: Philip Vasquez MRN: [id number] DOB: [birth year]   Cancelled Treatment:    Reason Eval/Treat Not Completed: Other (comment). Attempted x2. Pt refused stating "I don't want too". Brother present and said he will get up after lunch. Pt's brother was feeding him lunch. PT returned after lunch and pt con't to refused to get OOB. Pt states "I just don't want too. I don't need too." I asked if he wanted to get out of the hospital and he stated yes. PT then educated on how important it is to move so he can d/c. Pt con't to refuse and states "I'll just stay in bed all day." PT to attempted to return however unsure if/when patient will participate.  Kittie Plater, PT, DPT Acute Rehabilitation Services Pager #: [phone] Office #: [phone]    Berline Lopes 01/25/2019, 2:23 PM

## 2019-01-25 NOTE — Progress Notes (Signed)
Refusing food trays, takes crushed meds with h2o without difficulty. However, he has refused lunch and supper tray feedings today.

## 2019-01-25 NOTE — Progress Notes (Signed)
   Subjective: Was reportedly pulling on catheter overnight.  No complaints this morning.  Objective:  Vital signs in last 24 hours: Vitals:   01/24/19 2011 01/24/19 2355 01/25/19 0432 01/25/19 0528  BP: (!) 146/98 (!) 135/98 (!) 143/94   Pulse: 96 78 82   Resp: 18 17 18    Temp: (!) 100.5 F (38.1 C) 99.6 F (37.6 C) 99.2 F (37.3 C)   TempSrc: Oral Oral Oral   SpO2: 94%     Weight:    125.6 kg  Height:       Physical Exam: General: In no acute distress Cardiac: Heart regular rate and rhythm Pulmonary: Lung sounds clear to auscultation Abdomen: Bowel sounds active Neuro: Right-sided weakness of the upper and lower extremities  Summary In summary Mr. Reiland is a 58 year old gentleman who uses cocaine daily with no recorded past medical history who presented  left ACA/MCA watershed infarcts. His current deficits are right-sided weakness and dysarthria. The patient is currently medically stable and has been optimized for discharge. However, his disposition is unknown because he is not a candidate for CIR or SNF placement due to lack of insurance. He is homeless so home health is not an option.  Chronic/stable/resolved hosp problems  RESOLVED Melena: Resolved.thought to be 2/2 anticoagulation. GI consulted 9/30. Suggested that bleed likely from anticoag and no need for colonoscopy at this time.Signed off. Of note, pt has never had a colonoscopy.  Plan: PPI BID. will need to be set up for colonoscopy and GI appt upon d/c  RESOLVED Skin ulcers. Located on the dorsal surface of the penis and posterior scrotum. Some drainage from posterior lesion. Does not appear infectious at this point.  Plan Frequent repositioning, Barrier cream, Air mattress  #Poor appetite and likely malnutrition. Nursing notes indicate poor caloric intake. Pt refusing to eat much. Nursing offering assistance with eating. Somewhat improving  #HTN: continue losartan, metoprolol, amlodipine, hydralizine.    Assessment/Plan:  Principal Problem:   Cerebral thrombosis with cerebral infarction Active Problems:   Right sided weakness   Cocaine use disorder (HCC)   Hypertension   Hyperlipidemia   Ulcer of penis   Urinary retention   Depression   Generalized abdominal pain   Hematuria  #Dysarthria #R sided weakness #L ACA/MCA Infarcts>>related depression:  significant right sided weakness. Neurology consulted and signed off. Pt also experiencing depression 2/2 to current situation. Seems that his mood may be improving a bit. Appetite improving.  Still not working with therapy consistently. Plan -Continue statin and aspirin -Continue PT OT -Continue Prozac and Remeron  Urinary Retention/Hematuria. Nephrogenic vs BPH. Continue Cauti cath.  DO NOT place mittens for pulling on urinary catheter.  #DVT prophylaxis. Holding due to melena and risk for rebleeding  #Dispo: CIR referral placed with [facility]. Sent referral to [facility] and bed pending acceptance.CM work with this.Will need follow up with neuro in 6w post d/c  Mitzi Hansen, MD 01/25/19 4:05 PM

## 2019-01-25 NOTE — Progress Notes (Signed)
Pt's penis orifice bleeding and urine is bloody too, when asked not to message the area, Pt stated, " I want to" mittens were applied on both hands but still messing up with, will continue to encourage him not to, MD aware of the situation

## 2019-01-26 LAB — BASIC METABOLIC PANEL
Anion gap: 11 (ref 5–15)
BUN: 32 mg/dL — ABNORMAL HIGH (ref 6–20)
CO2: 23 mmol/L (ref 22–32)
Calcium: 9.2 mg/dL (ref 8.9–10.3)
Chloride: 105 mmol/L (ref 98–111)
Creatinine, Ser: 1.87 mg/dL — ABNORMAL HIGH (ref 0.61–1.24)
GFR calc Af Amer: 45 mL/min — ABNORMAL LOW (ref >60)
GFR calc non Af Amer: 39 mL/min — ABNORMAL LOW (ref >60)
Glucose, Bld: 111 mg/dL — ABNORMAL HIGH (ref 70–99)
Potassium: 4.1 mmol/L (ref 3.5–5.1)
Sodium: 139 mmol/L (ref 135–145)

## 2019-01-26 LAB — CBC
HCT: 29 % — ABNORMAL LOW (ref 39.0–52.0)
Hemoglobin: 9.2 g/dL — ABNORMAL LOW (ref 13.0–17.0)
MCH: 28.6 pg (ref 26.0–34.0)
MCHC: 31.7 g/dL (ref 30.0–36.0)
MCV: 90.1 fL (ref 80.0–100.0)
Platelets: 334 10*3/uL (ref 150–400)
RBC: 3.22 MIL/uL — ABNORMAL LOW (ref 4.22–5.81)
RDW: 12.5 % (ref 11.5–15.5)
WBC: 8.8 10*3/uL (ref 4.0–10.5)
nRBC: 0 % (ref 0.0–0.2)

## 2019-01-26 LAB — IRON AND TIBC
Iron: 20 ug/dL — ABNORMAL LOW (ref 45–182)
Saturation Ratios: 9 % — ABNORMAL LOW (ref 17.9–39.5)
TIBC: 228 ug/dL — ABNORMAL LOW (ref 250–450)
UIBC: 208 ug/dL

## 2019-01-26 LAB — FERRITIN: Ferritin: 172 ng/mL (ref 24–336)

## 2019-01-26 MED ORDER — ENOXAPARIN SODIUM 40 MG/0.4ML ~~LOC~~ SOLN
40.0000 mg | SUBCUTANEOUS | Status: DC
  Administered 2019-01-26: 40 mg via SUBCUTANEOUS
  Filled 2019-01-26 (×2): qty 0.4

## 2019-01-26 MED ORDER — SODIUM CHLORIDE 0.9 % IV BOLUS
1000.0000 mL | Freq: Once | INTRAVENOUS | Status: AC
  Administered 2019-01-26: 1000 mL via INTRAVENOUS

## 2019-01-26 MED ORDER — SODIUM CHLORIDE 0.9 % IV SOLN
INTRAVENOUS | Status: AC
  Administered 2019-01-26: 18:00:00 via INTRAVENOUS

## 2019-01-26 NOTE — Progress Notes (Signed)
PT Cancellation Note  Patient Details Name: Philip Vasquez MRN: [id number] DOB: [birth year]   Cancelled Treatment:    Reason Eval/Treat Not Completed: Other (comment).  )Pt had mult changes of his mind regarding treatment, first agreeing then saying no.  Finally closed his eyes and shook head no to any exercise or getting up, and declined his meal as well.   Ramond Dial 01/26/2019, 12:41 PM   Mee Hives, PT MS Acute Rehab Dept. Number: [place] [facility] [place]

## 2019-01-26 NOTE — Progress Notes (Signed)
Nutrition Follow-up  DOCUMENTATION CODES:   Obesity unspecified  INTERVENTION:  ContinueEnsure Enlive po BID, each supplement provides 350 kcal and 20 grams of protein.  Continue30 ml Prostat po BID, each supplement provides 100 kcal and 15 grams of protein.  Encourage adequate PO intake.  NUTRITION DIAGNOSIS:   Inadequate oral intake related to lethargy/confusion as evidenced by estimated needs(reported meal completion); ongoing  GOAL:   Patient will meet greater than or equal to 90% of their needs; progressing  MONITOR:   PO intake, Supplement acceptance, Skin, Weight trends, Labs, I & O's  REASON FOR ASSESSMENT:   Malnutrition Screening Tool    ASSESSMENT:   58 year old man with hyperlipidemia, hypertension, and cocaine use disorder admitted with right upper and lower extremity weakness admitted with scattered acute infarcts in the medial left ACA and MCA watershed distribution.  Meal completion has been 20-50%. Pt refuse meal tray earlier. MD and care team has been encouraging pt po intake at meals. Pt currently has Ensure and Prostat ordered and has been consuming them. RD to continue with current orders to aid in caloric and protein needs.    Labs and medications reviewed.  Diet Order:   Diet Order            Diet regular Room service appropriate? Yes with Assist; Fluid consistency: Thin  Diet effective now              EDUCATION NEEDS:   Not appropriate for education at this time  Skin:  Skin Assessment: Reviewed RN Assessment Skin Integrity Issues:: Other (Comment) Other: N/A  Last BM:  10/13  Height:   Ht Readings from Last 1 Encounters:  12/19/18 5\' 9"  (1.753 m)    Weight:   Wt Readings from Last 1 Encounters:  01/26/19 124.3 kg    Ideal Body Weight:  72.7 kg  BMI:  Body mass index is 40.46 kg/m.  Estimated Nutritional Needs:   Kcal:  2000-2200  Protein:  100-115 grams  Fluid:  >/= 2 L/day    Corrin Parker, MS, RD,  LDN Pager # [phone] After hours/ weekend pager # [phone]

## 2019-01-26 NOTE — Progress Notes (Signed)
Callback from IM letting them know the Pt had been pulling at the foley and was in a pull of blood. Clots surrounding the insertion site. When removing blood continues to heavily flow. IM stated they will come look as soon as they can. RN checked VSS and bladder scan was 310.

## 2019-01-26 NOTE — Progress Notes (Addendum)
Nursing paged regarding patient pulling at foley resulting in bloody discharge from penis. Patient assessed at bedside. He is hemodynamically stable. However, there is significant amount of clotted blood surrounding penile orifice. RN attempted to flush foley at bedside. Able to push fluid in but no return. At this time, will monitor and repeat bladder scan. Discussed with charge nurse to have nurse that works with [facility] cath assess patient. If continue to retain, will replace foley and consult urology. IVF decreased to 50 cc/hr.   ADDENDUM: Catheter assessed by coude nurse. Attempted to reposition but still did not get any urine return. Discussed with urology who recommended #91french with 3 way catheter, manual irrigation with 60cc catheter tip syringe. This was placed by coude nurse and patient's RN. Patient had urine return and patient's RN will continue to monitor and irrigate.

## 2019-01-26 NOTE — Progress Notes (Signed)
Paitent has poor PO intake.  Eating only about 4 to 5 spoonful of food every meal then he will push away despite being fed.  Will not feed himself.  Refused his ensure and all his vital milk shake that come in with the meal tray and said  he does not like milk.  Patient seems to prefer fruity taste juice.

## 2019-01-26 NOTE — Progress Notes (Signed)
   Subjective: No complaints this morning.  Discussed and refusing to work with physical therapy.  He repeatedly stated "I just don't want to".  Objective:  Vital signs in last 24 hours: Vitals:   01/25/19 2317 01/26/19 0347 01/26/19 0746 01/26/19 1137  BP: 116/81 (!) 136/96 121/90 (!) 81/61  Pulse: 75 77 81 68  Resp: 18 18 16 14   Temp: 98.2 F (36.8 C) 98.7 F (37.1 C) 98.8 F (37.1 C) 99.2 F (37.3 C)  TempSrc: Oral Oral Oral Axillary  SpO2: 95% 93% 99% 96%  Weight:  124.3 kg    Height:       Physical Exam: General: In no acute distress Cardiac: Heart regular rate and rhythm Pulmonary: Lung sounds clear to auscultation Abdomen: Bowel sounds active Neuro: Right-sided weakness of the upper and lower extremities  Summary In summary Philip Vasquez is a 58 year old gentleman who uses cocaine daily with no recorded past medical history who presented  left ACA/MCA watershed infarcts. His current deficits are right-sided weakness and dysarthria. The patient is currently medically stable and has been optimized for discharge. However, his disposition is unknown because he is not a candidate for CIR or SNF placement due to lack of insurance. He is homeless so home health is not an option.  Chronic/stable/resolved hosp problems  RESOLVED Melena: Resolved.thought to be 2/2 anticoagulation. GI consulted 9/30. Suggested that bleed likely from anticoag and no need for colonoscopy at this time.Signed off. Of note, pt has never had a colonoscopy.  Plan: PPI BID. will need to be set up for colonoscopy and GI appt upon d/c  RESOLVED Skin ulcers. Located on the dorsal surface of the penis and posterior scrotum. Some drainage from posterior lesion. Does not appear infectious at this point.  Plan Frequent repositioning, Barrier cream, Air mattress  #HTN: continue losartan, metoprolol, amlodipine, hydralizine.   Assessment/Plan:  Principal Problem:   Cerebral thrombosis with cerebral infarction  Active Problems:   Right sided weakness   Cocaine use disorder (HCC)   Hypertension   Hyperlipidemia   Ulcer of penis   Urinary retention   Depression   Generalized abdominal pain   Hematuria  #Dysarthria #R sided weakness #L ACA/MCA Infarcts>>related depression:  significant right sided weakness. Neurology consulted and signed off. Pt also experiencing depression 2/2 to current situation.  Continuing to refuse physical therapy.  Unclear how this will affect is placement for rehabilitation. Plan -Continue statin and aspirin -Continue PT and OT.  Encourage nursing staff to get patient into chair for meals. -Continue Prozac and Remeron  #Poor appetite and likely malnutrition.  Refused meal trays for both lunch and supper yesterday.  When asked about his eating behaviors, the patient states that he is eating all of his food.  This does not correlate with nursing notes.  Unclear what more we can do to improve his appetite.  Poor evidence exists for appetite stimulants.  He is already on Remeron.  We will continue to monitor  Urinary Retention/Hematuria. Nephrogenic vs BPH. Continue Cauti cath.  DO NOT place mittens for pulling on urinary catheter.  #DVT prophylaxis. Holding due to melena and risk for rebleeding  #Dispo: CIR referral placed with [facility]. Sent referral to [facility] and bed pending acceptance.CM work with this.Will need follow up with neuro in 6w post d/c  Karlissa Aron, MD 01/26/19 11:47 AM

## 2019-01-26 NOTE — Progress Notes (Signed)
  Date: 01/26/2019  Patient name: Philip Vasquez  Medical record number: [id number]  Date of birth: 1960/06/23        I have seen and evaluated this patient and I have discussed the plan of care with the house staff. Please see their note for complete details. I concur with their findings.  Velna Ochs, MD 01/26/2019, 6:26 PM

## 2019-01-27 ENCOUNTER — Encounter (HOSPITAL_COMMUNITY): Payer: Self-pay | Admitting: Internal Medicine

## 2019-01-27 DIAGNOSIS — D638 Anemia in other chronic diseases classified elsewhere: Secondary | ICD-10-CM | POA: Diagnosis present

## 2019-01-27 DIAGNOSIS — D509 Iron deficiency anemia, unspecified: Secondary | ICD-10-CM

## 2019-01-27 LAB — CBC
HCT: 26.4 % — ABNORMAL LOW (ref 39.0–52.0)
Hemoglobin: 8.4 g/dL — ABNORMAL LOW (ref 13.0–17.0)
MCH: 28.5 pg (ref 26.0–34.0)
MCHC: 31.8 g/dL (ref 30.0–36.0)
MCV: 89.5 fL (ref 80.0–100.0)
Platelets: 331 10*3/uL (ref 150–400)
RBC: 2.95 MIL/uL — ABNORMAL LOW (ref 4.22–5.81)
RDW: 12.5 % (ref 11.5–15.5)
WBC: 9.4 10*3/uL (ref 4.0–10.5)
nRBC: 0 % (ref 0.0–0.2)

## 2019-01-27 LAB — BASIC METABOLIC PANEL
Anion gap: 9 (ref 5–15)
BUN: 28 mg/dL — ABNORMAL HIGH (ref 6–20)
CO2: 21 mmol/L — ABNORMAL LOW (ref 22–32)
Calcium: 8.9 mg/dL (ref 8.9–10.3)
Chloride: 108 mmol/L (ref 98–111)
Creatinine, Ser: 1.51 mg/dL — ABNORMAL HIGH (ref 0.61–1.24)
GFR calc Af Amer: 58 mL/min — ABNORMAL LOW (ref >60)
GFR calc non Af Amer: 50 mL/min — ABNORMAL LOW (ref >60)
Glucose, Bld: 151 mg/dL — ABNORMAL HIGH (ref 70–99)
Potassium: 4.3 mmol/L (ref 3.5–5.1)
Sodium: 138 mmol/L (ref 135–145)

## 2019-01-27 MED ORDER — LIDOCAINE HCL URETHRAL/MUCOSAL 2 % EX GEL
1.0000 "application " | Freq: Once | CUTANEOUS | Status: AC
  Administered 2019-01-27: 1 via URETHRAL
  Filled 2019-01-27: qty 20

## 2019-01-27 MED ORDER — TAMSULOSIN HCL 0.4 MG PO CAPS
0.4000 mg | ORAL_CAPSULE | Freq: Every day | ORAL | Status: DC
  Administered 2019-01-28 – 2019-02-17 (×21): 0.4 mg via ORAL
  Filled 2019-01-27 (×21): qty 1

## 2019-01-27 MED ORDER — POLYSACCHARIDE IRON COMPLEX 150 MG PO CAPS
150.0000 mg | ORAL_CAPSULE | Freq: Every day | ORAL | Status: DC
  Administered 2019-01-27: 150 mg via ORAL
  Filled 2019-01-27: qty 1

## 2019-01-27 NOTE — Progress Notes (Addendum)
   Subjective: pulled on urinary cath last night resulting in occlusion and some blood loss in the process. Had to be replaced.   Objective:  Vital signs in last 24 hours: Vitals:   01/26/19 1913 01/26/19 2221 01/26/19 2328 01/27/19 0344  BP: 125/81 126/88 126/85 122/81  Pulse: 81 97 91 90  Resp: 18 20 18 16   Temp: 99.4 F (37.4 C) 99.8 F (37.7 C) 99.3 F (37.4 C) 99 F (37.2 C)  TempSrc: Oral Oral Oral Oral  SpO2: 96% 98% 99% 99%  Weight:    126.1 kg  Height:       Physical Exam: General: Chronically ill-appearing male.  No acute distress Cardiac: Heart regular rate and rhythm Pulmonary: Lung sounds clear to auscultation Abdomen: Moderately distended.  No pain on palpation.  Bowel sounds active. GU: cauti catheter in place Neuro: Weakness to the upper and lower right-sided extremities. Sensation to upper and lower extremities intact.   Summary In summary Philip Vasquez is a 58 year old gentleman who uses cocaine daily with no recorded past medical history who presented  left ACA/MCA watershed infarcts. His current deficits are right-sided weakness and dysarthria. The patient is currently medically stable and has been optimized for discharge. However, his disposition is unknown because he is not a candidate for CIR or SNF placement due to lack of insurance. He is homeless so home health is not an option.  Chronic/stable/resolved hosp problems  RESOLVED Melena: Resolved.thought to be 2/2 anticoagulation. GI consulted 9/30. Suggested that bleed likely from anticoag and no need for colonoscopy at this time.Signed off. Of note, pt has never had a colonoscopy.  Plan: PPI BID. will need to be set up for colonoscopy and GI appt upon d/c  RESOLVED Skin ulcers. Located on the dorsal surface of the penis and posterior scrotum. Some drainage from posterior lesion. Does not appear infectious at this point.  Plan Frequent repositioning, Barrier cream, Air mattress  #Poor appetite and likely  malnutrition. Nursing notes indicate poor caloric intake. Pt refusing to eat much. Nursing offering assistance with eating. Somewhat improving  #HTN: continue losartan, metoprolol, amlodipine, hydralizine.   Assessment/Plan:  Principal Problem:   Cerebral thrombosis with cerebral infarction Active Problems:   Right sided weakness   Cocaine use disorder (HCC)   Hypertension   Hyperlipidemia   Ulcer of penis   Urinary retention   Depression   Generalized abdominal pain   Hematuria  #Dysarthria #R sided weakness #L ACA/MCA Infarcts>>related depression:  significant right sided weakness. Neurology consulted and signed off. Pt also experiencing depression 2/2 to current situation.  Patient's continues to refuse physical therapy.  We have had lengthy discussions with patient's brother regarding the impact of refusing therapy on his ability to get into rehabilitation. Plan -Continue statin and aspirin -Continue PT and OT -Continue Prozac and Remeron  Urinary Retention/Hematuria. Neurogenic vs BPH.  Patient pulled on catheter last night resulting in of occlusion requiring catheter replacement.  Urology consulted. Appreciate their recommendations. Continue cauti cath  Anemia of chronic disease. Iron panel consistent with this. No further recommendations at this time.  #DVT prophylaxis. Holding due to melena and risk for rebleeding  #Dispo: CIR referral placed with [facility]. Sent referral to [facility] and bed pending acceptance.CM work with this.Will need follow up with neuro in 6w post d/c  Philip Tingley, MD 01/27/19 7:31 AM

## 2019-01-27 NOTE — Progress Notes (Signed)
#  21fr coude removed, large liver clot expelled after removal, pt voided bloody pink urine. Pt's groin washed and dried. Urojet lidocaine inserted into penis. Penis cleansed per protocol and #82fr coude catheter inserted without difficulty. Immediate return of approximately 300 ml's of cherry red urine. Catheter irrigated with 20 ml flush with immediate return of pink tinge urine. Foley secured with anchor device. Pt tolerated well.

## 2019-01-27 NOTE — Progress Notes (Addendum)
Physical Therapy Treatment Patient Details Name: Philip Vasquez MRN: [id number] DOB: 18-Jan-1961 Today's Date: 01/27/2019    History of Present Illness Philip Vasquez is a 58 year old gentleman with no past medical history who presented with AMS and right-sided weakness. MRI: Widely scattered small acute infarcts in the medial lefthemisphere involving left ACA and left ACA/MCA watershed area.    PT Comments    Pt agreeable to physical therapy session with encouragement. Performing bed mobility with mod-max assist; had to be returned to supine for peri care due to bowel incontinence. Continues with flat affect, decreased awareness, right sided weakness, and noted right ankle clonus. Will progress as tolerated. Goals remain appropriate.     Follow Up Recommendations  Supervision/Assistance - 24 hour;SNF     Equipment Recommendations  Other (comment)(defer)    Recommendations for Other Services       Precautions / Restrictions Precautions Precautions: Fall Restrictions Weight Bearing Restrictions: No    Mobility  Bed Mobility Overal bed mobility: Needs Assistance Bed Mobility: Rolling;Supine to Sit;Sit to Supine Rolling: Mod assist   Supine to sit: Max assist;+2 for physical assistance Sit to supine: Max assist;+2 for physical assistance   General bed mobility comments: MaxA + 2 to progress RLE to edge of bed and trunk elevation. Pt initiating scooting hips over via bridging. Pt with bowel movement sitting edge of bed so returned to supine and rolled to right and left with modA for peri care  Transfers                    Ambulation/Gait                 Stairs             Wheelchair Mobility    Modified Rankin (Stroke Patients Only) Modified Rankin (Stroke Patients Only) Pre-Morbid Rankin Score: Moderate disability Modified Rankin: Moderately severe disability     Balance Overall balance assessment: Needs assistance Sitting-balance support: Single  extremity supported;Bilateral upper extremity supported;Feet supported Sitting balance-Leahy Scale: Poor Sitting balance - Comments: reliant on single UE support, supervision for safety                                    Cognition Arousal/Alertness: Awake/alert Behavior During Therapy: Flat affect Overall Cognitive Status: Impaired/Different from baseline Area of Impairment: Following commands;Safety/judgement;Problem solving                   Current Attention Level: Sustained Memory: Decreased recall of precautions;Decreased short-term memory Following Commands: Follows multi-step commands inconsistently;Follows one step commands with increased time Safety/Judgement: Decreased awareness of safety;Decreased awareness of deficits Awareness: Intellectual Problem Solving: Difficulty sequencing;Requires verbal cues;Requires tactile cues;Slow processing General Comments: Pt responding to questions appropriately, decreased awareness of deficits and functional impact      Exercises Other Exercises Other Exercises: PROM R ankle dorsiflexion    General Comments        Pertinent Vitals/Pain Pain Assessment: Faces Faces Pain Scale: No hurt    Home Living                      Prior Function            PT Goals (current goals can now be found in the care plan section) Acute Rehab PT Goals Patient Stated Goal: "to relax." PT Goal Formulation: With patient Time For Goal Achievement: 02/10/19 Potential to Achieve Goals:  Fair Progress towards PT goals: Progressing toward goals    Frequency    Min 3X/week      PT Plan Frequency needs to be updated    Co-evaluation              AM-PAC PT "6 Clicks" Mobility   Outcome Measure  Help needed turning from your back to your side while in a flat bed without using bedrails?: A Lot Help needed moving from lying on your back to sitting on the side of a flat bed without using bedrails?:  Total Help needed moving to and from a bed to a chair (including a wheelchair)?: Total Help needed standing up from a chair using your arms (e.g., wheelchair or bedside chair)?: Total Help needed to walk in hospital room?: Total Help needed climbing 3-5 steps with a railing? : Total 6 Click Score: 7    End of Session   Activity Tolerance: Patient tolerated treatment well Patient left: in bed;with call bell/phone within reach;with bed alarm set Nurse Communication: Mobility status PT Visit Diagnosis: Other abnormalities of gait and mobility (R26.89);Hemiplegia and hemiparesis;Muscle weakness (generalized) (M62.81) Hemiplegia - Right/Left: Right     Time: 0867-6195 PT Time Calculation (min) (ACUTE ONLY): 26 min  Charges:  $Therapeutic Activity: 23-37 mins                     Ellamae Sia, PT, DPT Acute Rehabilitation Services Pager [phone] Office [phone]    Willy Eddy 01/27/2019, 2:39 PM

## 2019-01-27 NOTE — Progress Notes (Signed)
Called to assess coude catheter placement. #27fr coude catheter in place and secured with stat lock device. Scant amount of dark red urine present in tube. Balloon deflated and repositioned to bifurcation no evidence of coiling of catheter, balloon re-inflated. No urine return. 30 ml flush to coude, upon flushing there was a small amount of bloody urine that leaked from around the catheter and no urine was returned. RN caring for patient was present during assessment. RN will call urology to update.

## 2019-01-27 NOTE — Plan of Care (Signed)
Patient progressing toward plan of care goals. 

## 2019-01-27 NOTE — Consult Note (Signed)
Urology Consult   Physician requesting consult: Elige Radon MD  Reason for consult: Urinary retention  History of Present Illness: Philip Vasquez is a 58 y.o. with history of acute urinary retention and gross hematuria after patient's manipulation of Foley catheter; his urinary retention is in the setting of a recent left ACA/MCA infarct.  Attempted interview of patient on 01/27/2019 on morning rounds; patient appeared awake and oriented but did not really answer questions.  From chart review, it appears that he has had a few episodes of urinary retention as well as pulling on Foley catheter resulting in intermittent hematuria.  It appears that his most recent catheter had been placed on 01/19/2019.  Urology called overnight because it appeared the patient had been pulling on catheter again and catheter had become occluded with blood clot.  On bladder scan, he reportedly had around 300 cc of urine versus clot in bladder.  Nursing was able to upsize catheter to 22 French coud tip catheter, and irrigate patient to clear yellow urine.  CBI was not indicated.  22 French catheter placed by nursing overnight is draining clear yellow urine on examination of patient on 01/27/2019, appears to be draining well with no sign of clot.   History reviewed. No pertinent past medical history.  History reviewed. No pertinent surgical history.  Current Hospital Medications:  Home Meds:  No current facility-administered medications on file prior to encounter.    No current outpatient medications on file prior to encounter.     Scheduled Meds: . amLODipine  10 mg Oral Daily  . aspirin EC  81 mg Oral Daily  . baclofen  5 mg Oral TID  . Chlorhexidine Gluconate Cloth  6 each Topical Q0600  . enoxaparin (LOVENOX) injection  40 mg Subcutaneous Q24H  . feeding supplement (ENSURE ENLIVE)  237 mL Oral BID BM  . feeding supplement (PRO-STAT SUGAR FREE 64)  30 mL Oral BID  . FLUoxetine  40 mg Oral Daily  .  folic acid  1 mg Oral Daily  . Gerhardt's butt cream   Topical BID  . hydrALAZINE  75 mg Oral TID  . iron polysaccharides  150 mg Oral Daily  . losartan  50 mg Oral Daily  . mouth rinse  15 mL Mouth Rinse BID  . metoprolol tartrate  25 mg Oral BID  . mirtazapine  7.5 mg Oral QHS  . multivitamin with minerals  1 tablet Oral Daily  . pantoprazole  40 mg Oral BID  . polyethylene glycol  17 g Oral BID  . rosuvastatin  40 mg Oral q1800  . senna-docusate  2 tablet Oral QHS  . thiamine  100 mg Oral Daily   Or  . thiamine  100 mg Intravenous Daily   Continuous Infusions: PRN Meds:.acetaminophen **OR** acetaminophen, bisacodyl  Allergies: No Known Allergies  No family history on file.  Social History:  reports that he has never smoked. He has never used smokeless tobacco. He reports current alcohol use. He reports that he does not use drugs.  ROS: A complete review of systems was performed.  All systems are negative except for pertinent findings as noted.  Physical Exam:  Vital signs in last 24 hours: Temp:  [99 F (37.2 C)-99.8 F (37.7 C)] 99 F (37.2 C) (10/21 0344) Pulse Rate:  [68-97] 90 (10/21 0344) Resp:  [14-20] 16 (10/21 0344) BP: (81-132)/(61-88) 122/81 (10/21 0344) SpO2:  [96 %-99 %] 99 % (10/21 0344) Weight:  [126.1 kg] 126.1 kg (10/21 0344) Constitutional:  No acute distress Cardiovascular: Regular rate  Respiratory: Normal respiratory effort GI: Abdomen soft, nondistended GU: Foley in place draining clear yellow urine, no sign of clot.   Laboratory Data:  Recent Labs    01/26/19 1534 01/27/19 0359  WBC 8.8 9.4  HGB 9.2* 8.4*  HCT 29.0* 26.4*  PLT 334 331    Recent Labs    01/24/19 0941 01/26/19 1534 01/27/19 0359  NA 135 139 138  K 4.2 4.1 4.3  CL 101 105 108  GLUCOSE 107* 111* 151*  BUN 15 32* 28*  CALCIUM 9.4 9.2 8.9  CREATININE 1.26* 1.87* 1.51*     Results for orders placed or performed during the hospital encounter of 12/19/18 (from  the past 24 hour(s))  Basic metabolic panel     Status: Abnormal   Collection Time: 01/26/19  3:34 PM  Result Value Ref Range   Sodium 139 135 - 145 mmol/L   Potassium 4.1 3.5 - 5.1 mmol/L   Chloride 105 98 - 111 mmol/L   CO2 23 22 - 32 mmol/L   Glucose, Bld 111 (H) 70 - 99 mg/dL   BUN 32 (H) 6 - 20 mg/dL   Creatinine, Ser 5.401.87 (H) 0.61 - 1.24 mg/dL   Calcium 9.2 8.9 - 98.110.3 mg/dL   GFR calc non Af Amer 39 (L) >60 mL/min   GFR calc Af Amer 45 (L) >60 mL/min   Anion gap 11 5 - 15  CBC     Status: Abnormal   Collection Time: 01/26/19  3:34 PM  Result Value Ref Range   WBC 8.8 4.0 - 10.5 K/uL   RBC 3.22 (L) 4.22 - 5.81 MIL/uL   Hemoglobin 9.2 (L) 13.0 - 17.0 g/dL   HCT 19.129.0 (L) 47.839.0 - 29.552.0 %   MCV 90.1 80.0 - 100.0 fL   MCH 28.6 26.0 - 34.0 pg   MCHC 31.7 30.0 - 36.0 g/dL   RDW 62.112.5 30.811.5 - 65.715.5 %   Platelets 334 150 - 400 K/uL   nRBC 0.0 0.0 - 0.2 %  Ferritin     Status: None   Collection Time: 01/26/19  6:00 PM  Result Value Ref Range   Ferritin 172 24 - 336 ng/mL  Iron and TIBC     Status: Abnormal   Collection Time: 01/26/19  6:00 PM  Result Value Ref Range   Iron 20 (L) 45 - 182 ug/dL   TIBC 846228 (L) 962250 - 952450 ug/dL   Saturation Ratios 9 (L) 17.9 - 39.5 %   UIBC 208 ug/dL  Basic metabolic panel     Status: Abnormal   Collection Time: 01/27/19  3:59 AM  Result Value Ref Range   Sodium 138 135 - 145 mmol/L   Potassium 4.3 3.5 - 5.1 mmol/L   Chloride 108 98 - 111 mmol/L   CO2 21 (L) 22 - 32 mmol/L   Glucose, Bld 151 (H) 70 - 99 mg/dL   BUN 28 (H) 6 - 20 mg/dL   Creatinine, Ser 8.411.51 (H) 0.61 - 1.24 mg/dL   Calcium 8.9 8.9 - 32.410.3 mg/dL   GFR calc non Af Amer 50 (L) >60 mL/min   GFR calc Af Amer 58 (L) >60 mL/min   Anion gap 9 5 - 15  CBC     Status: Abnormal   Collection Time: 01/27/19  3:59 AM  Result Value Ref Range   WBC 9.4 4.0 - 10.5 K/uL   RBC 2.95 (L) 4.22 - 5.81 MIL/uL  Hemoglobin 8.4 (L) 13.0 - 17.0 g/dL   HCT 26.4 (L) 39.0 - 52.0 %   MCV 89.5 80.0 -  100.0 fL   MCH 28.5 26.0 - 34.0 pg   MCHC 31.8 30.0 - 36.0 g/dL   RDW 12.5 11.5 - 15.5 %   Platelets 331 150 - 400 K/uL   nRBC 0.0 0.0 - 0.2 %   Recent Results (from the past 240 hour(s))  Culture, blood (routine x 2)     Status: None   Collection Time: 01/18/19  7:40 AM   Specimen: BLOOD  Result Value Ref Range Status   Specimen Description BLOOD RIGHT ANTECUBITAL  Final   Special Requests   Final    BOTTLES DRAWN AEROBIC ONLY Blood Culture adequate volume   Culture   Final    NO GROWTH 5 DAYS Performed at [facility], [address], [place], [place] [address]    Report Status 01/23/2019 FINAL  Final  Culture, blood (routine x 2)     Status: None   Collection Time: 01/18/19  7:46 AM   Specimen: BLOOD  Result Value Ref Range Status   Specimen Description BLOOD RIGHT ANTECUBITAL  Final   Special Requests   Final    BOTTLES DRAWN AEROBIC ONLY Blood Culture adequate volume   Culture   Final    NO GROWTH 5 DAYS Performed at [facility], [place] [address], [place],  [address]    Report Status 01/23/2019 FINAL  Final    Renal Function: Recent Labs    01/24/19 0941 01/26/19 1534 01/27/19 0359  CREATININE 1.26* 1.87* 1.51*   Estimated Creatinine Clearance: 70.1 mL/min (A) (by C-G formula based on SCr of 1.51 mg/dL (H)).  Radiologic Imaging: No results found.  I independently reviewed the above imaging studies.  Impression/Recommendation 58 yo M with acute urinary retention during hospitalization for stroke as well as a few episodes of gross hematuria directly related to patient pulling on catheter. Blood clot caused catheter to become clogged and subsequently get replaced/upsized on 10/21. It is unclear why he has pulled at catheter, and it appears his last trial of void was at least a week ago. Given his history of pulling at catheter and traumatizing bladder/urethra, it may be worth a repeat trial of void soon if his urine remains free of clot to see if  he can remain catheter free. Urology will follow up with patient again, see if urine remains clear, and make further recommendations regarding catheter management and timing of next trial of void.  Haskel Schroeder 01/27/2019, 8:40 AM

## 2019-01-28 NOTE — Progress Notes (Addendum)
   Subjective: No events overnight.  Objective:  Vital signs in last 24 hours: Vitals:   01/27/19 1924 01/27/19 2302 01/28/19 0329 01/28/19 0917  BP: 122/68 129/76 129/88 128/78  Pulse: 88 81 91 76  Resp: 18 18 18 17   Temp: 98.7 F (37.1 C) 98.8 F (37.1 C) 98.3 F (36.8 C) 97.7 F (36.5 C)  TempSrc: Oral Oral Oral Oral  SpO2: 96% 98% 99% 100%  Weight:   126 kg   Height:       Physical Exam: General: Chronically ill-appearing male Cardiac: Heart regular rate and rhythm Lungs: Lung sounds clear to auscultation Abdomen: Bowel sounds active Skin: No rash Neuro: Diffuse right-sided weakness  Summary In summary Philip Vasquez is a 58 year old gentleman who uses cocaine daily with no recorded past medical history who presented  left ACA/MCA watershed infarcts. His current deficits are right-sided weakness and dysarthria. The patient is currently medically stable and has been optimized for discharge. However, his disposition is unknown because he is not a candidate for CIR or SNF placement due to lack of insurance. He is homeless so home health is not an option. 58 year old male admitted on 12/19/2022 for left ACA/MCA watershed infarcts resulting in diffuse right-sided weakness and dysarthria.  He completed 3 weeks of dual antiplatelet therapy.  Patient developed secondary depression and is showing improvement on fluoxetine. Chronic/stable/resolved hosp problems  RESOLVED Melena: Resolved.thought to be 2/2 anticoagulation. GI consulted 9/30. Suggested that bleed likely from anticoag and no need for colonoscopy at this time. Plan: PPI BID. will need to be set up for colonoscopy and GI appt upon d/c  RESOLVED Skin ulcers. Located on the dorsal surface of the penis and posterior scrotum.  Plan Frequent repositioning, Barrier cream, Air mattress  #Poor appetite and likely malnutrition.  Somewhat improving  #HTN: continue losartan, metoprolol, amlodipine, hydralizine.   Assessment/Plan:  Principal Problem:   Cerebral thrombosis with cerebral infarction Active Problems:   Right sided weakness   Hypertension   Hyperlipidemia   Ulcer of penis   Urinary retention   Depression   Hematuria   Anemia of chronic disease  #L ACA/MCA Infarcts resulting in right sided weakness and dysarthria and developed situational related depression.  Worked with therapy yesterday. Plan -Continue statin and aspirin -Continue PT and OT -Continue Prozac and Remeron -Out of bed for a minimum of 3 hours each shift.  Up in chair for meals.  Urinary Retention/Hematuria.  Urology consulted--do not believe this is neurogenic bladder with ACA/MCA infarcts and they suggest this may have to do with more psych related issues.  Recommended starting Flomax and give him a voiding trial today.  Avoid being discharged with catheter if possible.  #DVT prophylaxis. Holding due to melena and risk for rebleeding  #Dispo: CIR referral placed with [facility]. [facility] bed pending acceptance.CM working with this.Will need follow up with neuro in 6w post d/c  Philip Quinton, MD 01/28/19 10:36 AM

## 2019-01-28 NOTE — Progress Notes (Signed)
SLP Cancellation Note  Patient Details Name: Kylor Valverde MRN: [id number] DOB: September 18, 1960   Cancelled treatment:        Patient refused therapy. He just received his bath and wants to sleep. Will continue efforts.  Charlynne Cousins Zaidy Absher, MA, CCC-SLP 01/28/2019 1:49 PM

## 2019-01-28 NOTE — Plan of Care (Signed)
Patient progressing towards plan of care goals. 

## 2019-01-29 DIAGNOSIS — Z9889 Other specified postprocedural states: Secondary | ICD-10-CM

## 2019-01-29 LAB — CBC WITH DIFFERENTIAL/PLATELET
Abs Immature Granulocytes: 0.04 10*3/uL (ref 0.00–0.07)
Basophils Absolute: 0 10*3/uL (ref 0.0–0.1)
Basophils Relative: 0 %
Eosinophils Absolute: 0.1 10*3/uL (ref 0.0–0.5)
Eosinophils Relative: 1 %
HCT: 26.3 % — ABNORMAL LOW (ref 39.0–52.0)
Hemoglobin: 8.7 g/dL — ABNORMAL LOW (ref 13.0–17.0)
Immature Granulocytes: 0 %
Lymphocytes Relative: 16 %
Lymphs Abs: 1.5 10*3/uL (ref 0.7–4.0)
MCH: 29.4 pg (ref 26.0–34.0)
MCHC: 33.1 g/dL (ref 30.0–36.0)
MCV: 88.9 fL (ref 80.0–100.0)
Monocytes Absolute: 0.5 10*3/uL (ref 0.1–1.0)
Monocytes Relative: 5 %
Neutro Abs: 6.9 10*3/uL (ref 1.7–7.7)
Neutrophils Relative %: 78 %
Platelets: 315 10*3/uL (ref 150–400)
RBC: 2.96 MIL/uL — ABNORMAL LOW (ref 4.22–5.81)
RDW: 12.3 % (ref 11.5–15.5)
WBC: 9 10*3/uL (ref 4.0–10.5)
nRBC: 0 % (ref 0.0–0.2)

## 2019-01-29 LAB — CBC
HCT: 22.7 % — ABNORMAL LOW (ref 39.0–52.0)
Hemoglobin: 7.1 g/dL — ABNORMAL LOW (ref 13.0–17.0)
MCH: 28.2 pg (ref 26.0–34.0)
MCHC: 31.3 g/dL (ref 30.0–36.0)
MCV: 90.1 fL (ref 80.0–100.0)
Platelets: 304 10*3/uL (ref 150–400)
RBC: 2.52 MIL/uL — ABNORMAL LOW (ref 4.22–5.81)
RDW: 12.5 % (ref 11.5–15.5)
WBC: 7.9 10*3/uL (ref 4.0–10.5)
nRBC: 0 % (ref 0.0–0.2)

## 2019-01-29 LAB — BASIC METABOLIC PANEL
Anion gap: 8 (ref 5–15)
BUN: 23 mg/dL — ABNORMAL HIGH (ref 6–20)
CO2: 21 mmol/L — ABNORMAL LOW (ref 22–32)
Calcium: 8.4 mg/dL — ABNORMAL LOW (ref 8.9–10.3)
Chloride: 114 mmol/L — ABNORMAL HIGH (ref 98–111)
Creatinine, Ser: 1.46 mg/dL — ABNORMAL HIGH (ref 0.61–1.24)
GFR calc Af Amer: >60 mL/min (ref >60)
GFR calc non Af Amer: 52 mL/min — ABNORMAL LOW (ref >60)
Glucose, Bld: 144 mg/dL — ABNORMAL HIGH (ref 70–99)
Potassium: 3.8 mmol/L (ref 3.5–5.1)
Sodium: 143 mmol/L (ref 135–145)

## 2019-01-29 LAB — GLUCOSE, CAPILLARY
Glucose-Capillary: 157 mg/dL — ABNORMAL HIGH (ref 70–99)
Glucose-Capillary: 171 mg/dL — ABNORMAL HIGH (ref 70–99)
Glucose-Capillary: 160 mg/dL — ABNORMAL HIGH (ref 70–99)
Glucose-Capillary: 139 mg/dL — ABNORMAL HIGH (ref 70–99)

## 2019-01-29 LAB — ABO/RH: ABO/RH(D): O POS

## 2019-01-29 LAB — PREPARE RBC (CROSSMATCH)

## 2019-01-29 MED ORDER — SODIUM CHLORIDE 0.9 % IV BOLUS
1000.0000 mL | Freq: Once | INTRAVENOUS | Status: AC
  Administered 2019-01-29: 1000 mL via INTRAVENOUS

## 2019-01-29 MED ORDER — SODIUM CHLORIDE 0.9% IV SOLUTION
Freq: Once | INTRAVENOUS | Status: AC
  Administered 2019-01-29: 04:00:00 via INTRAVENOUS

## 2019-01-29 NOTE — Progress Notes (Signed)
  Speech Language Pathology Treatment: Cognitive-Linquistic  Patient Details Name: Philip Vasquez MRN: [id number] DOB: [birth year] Today's Date: 01/29/2019 Time: 6314-9702 SLP Time Calculation (min) (ACUTE ONLY): 17 min  Assessment / Plan / Recommendation Clinical Impression  Patient was reclined in bed and awake. He was in a pleasant mood and agreed to Speech tx today. To engage patient, he was asked biographical questions... born in [place], graduated from [employer] and attended [employer] to study [employer]. His speech was very fluid and processing time was WNL. When asked if he worked he began to shake his head no and close his eyes. Complex problem solving activity was introduced and he was able to complete with 75% accuracy. He refused further activities stating "I just wanna go home". He also stated he can't leave because of his stroke.   HPI HPI: Pt is a 58 year old gentleman with no past medical history who presented with AMS and right-sided weakness. MRI: Widely scattered small acute infarcts in the medial lefthemisphere involving left ACA and left ACA/MCA watershed area. UA positive for cocaine use.       SLP Plan  Continue with current plan of care                       Oral Care Recommendations: Oral care BID SLP Visit Diagnosis: Cognitive communication deficit (O37.858) Plan: Continue with current plan of care       [place], MA, CCC-SLP 01/29/2019 10:48 AM

## 2019-01-29 NOTE — Progress Notes (Signed)
RN came to reassess pt and noted pt to have large amount of blood with clots and he continued to bleed from his penis; MD on-call paged and notified to come and witness pt bleeding; MD at bedside and new orders received as pt was noted to be sweaty and hypotensive; CBG checked; bolus started and infusing; will continue to closely monitor pt. Delia Heady RN   01/29/19 0151  Vitals  Temp 98.9 F (37.2 C)  Temp Source Oral  BP (!) 74/46  MAP (mmHg) (!) 54  BP Location Left Arm  BP Method Automatic  Patient Position (if appropriate) Lying  Pulse Rate 70  Resp 20  Oxygen Therapy  SpO2 98 %  MEWS Score  MEWS RR 0  MEWS Pulse 0  MEWS Systolic 2  MEWS LOC 0  MEWS Temp 0  MEWS Score 2  MEWS Score Color Yellow

## 2019-01-29 NOTE — Progress Notes (Signed)
   Subjective: Urethral bleeding overnight.  Hemoglobin dropped to 7.1.  1 unit PRBCs transfused.  Denies pain this morning.  Objective:  Vital signs in last 24 hours: Vitals:   01/29/19 0450 01/29/19 0642 01/29/19 0722 01/29/19 0746  BP:  115/83 138/78 123/86  Pulse:  77 76 73  Resp:  20 20 20   Temp:  98.6 F (37 C) 98 F (36.7 C) 98 F (36.7 C)  TempSrc:  Oral Oral Oral  SpO2:  97% 96% 100%  Weight: 122.5 kg     Height:       Physical Exam: General: Chronically ill-appearing male Cardiac: Heart regular rate and rhythm Pulmonary: Lung sounds clear to auscultation Abdomen: Soft nontender Neuro: Alert and oriented  Summary In summary Mr. Zhong is a 58 year old gentleman who uses cocaine daily with no recorded past medical history who presented  left ACA/MCA watershed infarcts. His current deficits are right-sided weakness and dysarthria. The patient is currently medically stable and has been optimized for discharge. However, his disposition is unknown because he is not a candidate for CIR or SNF placement due to lack of insurance. He is homeless so home health is not an option. 58 year old male admitted on 12/19/2022 for left ACA/MCA watershed infarcts resulting in diffuse right-sided weakness and dysarthria.  He completed 3 weeks of dual antiplatelet therapy.  Patient developed secondary depression and is showing improvement on fluoxetine. Chronic/stable/resolved hosp problems  RESOLVED Melena: Resolved.thought to be 2/2 anticoagulation. GI consulted 9/30. Suggested that bleed likely from anticoag and no need for colonoscopy at this time. Plan: PPI BID. will need to be set up for colonoscopy and GI appt upon d/c  RESOLVED Skin ulcers. Located on the dorsal surface of the penis and posterior scrotum.  Plan Frequent repositioning, Barrier cream, Air mattress  #Poor appetite and likely malnutrition.  Somewhat improving  #HTN: continue losartan, metoprolol, amlodipine,  hydralizine.   Assessment/Plan:  Principal Problem:   Cerebral thrombosis with cerebral infarction Active Problems:   Right sided weakness   Hypertension   Hyperlipidemia   Ulcer of penis   Urinary retention   Depression   Hematuria   Anemia of chronic disease  #L ACA/MCA Infarcts resulting in right sided weakness and dysarthria and developed situational related depression.  Worked with therapy yesterday. Plan -Continue statin and aspirin -Continue PT and OT -Prozac and Remeron -Patient needs to be out of bed for minimum 3 hours per shift.  Been in chair for meals.  Urinary Retention/Hematuria.  Patient had fairly substantial bleeding overnight with a drop in hemoglobin to 7.1. Urology consulted overnight.  Recommend replacing catheter.  1 unit PRBC was transfused.  Also developed associated hypotension that responded to 1 L bolus.  Posttransfusion hemoglobin 8.7.   Plan Continue urinary catheter   #DVT prophylaxis.  Lovenox  #Dispo: CIR referral placed with [facility]. [facility] pending acceptance.CM working with this.discussed with social work yesterday and no available at this time.  Will need follow up with neuro in 6w post d/c  Bud Kaeser, MD 01/29/19 8:59 AM

## 2019-01-29 NOTE — Progress Notes (Signed)
OT Cancellation Note  Patient Details Name: Philip Vasquez MRN: [id number] DOB: 1960-06-09   Cancelled Treatment:    Reason Eval/Treat Not Completed: Patient declined, no reason specified. Pt verbalized "no" and declined participation in therapeutic intervention even after encouragement provided.   Gypsy Decant, MS, OTR/L 01/29/2019, 10:08 AM

## 2019-01-29 NOTE — Progress Notes (Signed)
Pt 1 unit of blood completed with no s/s reaction voiced or noted. Pt resting comfortably in bed; foley remains intact and AD notified of foley insertion. Delia Heady RN   01/29/19 4403  Vitals  Temp 98.6 F (37 C)  Temp Source Oral  BP 115/83  MAP (mmHg) 94  BP Location Left Arm  BP Method Automatic  Patient Position (if appropriate) Lying  Pulse Rate 77  Pulse Rate Source Monitor  Resp 20  Oxygen Therapy  SpO2 97 %  O2 Device Room Air  MEWS Score  MEWS RR 0  MEWS Pulse 0  MEWS Systolic 0  MEWS LOC 0  MEWS Temp 0  MEWS Score 0  MEWS Score Color Green

## 2019-01-29 NOTE — Progress Notes (Signed)
Completed EKG.

## 2019-01-29 NOTE — Progress Notes (Signed)
NS bolus completed; Pt remains stable and BP normal; pt sleeping in bed with call light within reach; pt continue to bleed; RN will continue to closely monitor. Delia Heady RN   01/29/19 0309  Vitals  Temp 98.8 F (37.1 C)  Temp Source Oral  BP 122/79  MAP (mmHg) 94  BP Location Left Arm  BP Method Automatic  Patient Position (if appropriate) Lying  Pulse Rate 68  Pulse Rate Source Monitor  Resp 20  Oxygen Therapy  SpO2 98 %  O2 Device Room Air  MEWS Score  MEWS RR 0  MEWS Pulse 0  MEWS Systolic 0  MEWS LOC 0  MEWS Temp 0  MEWS Score 0  MEWS Score Color Green

## 2019-01-29 NOTE — Progress Notes (Addendum)
Paged by RN at 12:30am about patient having blood clots on pad and penis. Patient assessed at bedside. He had some clotting noted on the pad but did not appear to be actively bleeding from penis orifice. Blood clots likely from trauma of coude cath. RN to monitor patient.   Paged by RN at [place] regarding patient having significant bleeding. Patient assessed at bedside. Noted to have large blood clot (see picture in chart). BP 74/46 and patient appeared diaphoretic. However, he remains oriented to self and time and place and when asked about situation, he states "you are getting on my nerves".  Patient given 1L bolus and obtaining CBC STAT. Ordered type and screen with 1u pRBC transfusion. Tele ordered. Urology paged.   ADDENDUM: BP improved to 121/82 with fluid resuscitation. Patient continues to have bright red blood per penile orifice. Will continue to monitor and will discuss with urology.   ADDENDUM#2: Urology recommendation for reinsertion of foley. #22 french 3 way foley catheter to be inserted by RN.

## 2019-01-29 NOTE — Progress Notes (Signed)
Pt was bladder scanned for 346ml; pt later bladder scanned for 158ml; NT and RN checked pt to see if he had voided but; pt was assessed to have large blood with clots on the pads and on penis. MD on-call was notified and she came in to assess pt. No new orders received rather, RN to continue to assess pt. Delia Heady RN

## 2019-01-29 NOTE — Progress Notes (Signed)
Asked by Dr. Marva Panda to assist with placement of 24FR 3 way foley catheter d/t penile bleeding. Urology aware and recommended foley insertion. Before insertion, large blood clots noted to be around penis. Foley inserted without resistance with clear yellow urine at first and then pink-tingled urine with small clots seen. Clamped lopez valve placed on irrigation portion of 3 way foley d/t irrigation not ordered at this time  Pt tolerated procedure well and Dr. Marva Panda came to bedside after insertion. RN to placed foley strap on foley.

## 2019-01-29 NOTE — Progress Notes (Signed)
PT Cancellation Note  Patient Details Name: Philip Vasquez MRN: [id number] DOB: 1960/04/26   Cancelled Treatment:    Reason Eval/Treat Not Completed: Patient declined, no reason specified (continuously shaking head no; did not give a reason for refusal and asked PT to leave room).  Ellamae Sia, PT, DPT Acute Rehabilitation Services Pager [phone] Office [phone]    Willy Eddy 01/29/2019, 9:42 AM

## 2019-01-30 LAB — CBC
HCT: 23.9 % — ABNORMAL LOW (ref 39.0–52.0)
Hemoglobin: 7.7 g/dL — ABNORMAL LOW (ref 13.0–17.0)
MCH: 29.3 pg (ref 26.0–34.0)
MCHC: 32.2 g/dL (ref 30.0–36.0)
MCV: 90.9 fL (ref 80.0–100.0)
Platelets: 316 10*3/uL (ref 150–400)
RBC: 2.63 MIL/uL — ABNORMAL LOW (ref 4.22–5.81)
RDW: 12.9 % (ref 11.5–15.5)
WBC: 9.6 10*3/uL (ref 4.0–10.5)
nRBC: 0 % (ref 0.0–0.2)

## 2019-01-30 LAB — TYPE AND SCREEN
ABO/RH(D): O POS
Antibody Screen: NEGATIVE
Unit division: 0

## 2019-01-30 LAB — BPAM RBC
Blood Product Expiration Date: 202011102359
ISSUE DATE / TIME: 202010230425
Unit Type and Rh: 5100

## 2019-01-30 LAB — TROPONIN I (HIGH SENSITIVITY): Troponin I (High Sensitivity): 6 ng/L (ref <18)

## 2019-01-30 LAB — MAGNESIUM: Magnesium: 2.1 mg/dL (ref 1.7–2.4)

## 2019-01-30 LAB — GLUCOSE, CAPILLARY: Glucose-Capillary: 115 mg/dL — ABNORMAL HIGH (ref 70–99)

## 2019-01-30 NOTE — Progress Notes (Signed)
   Subjective:  Patient denies pain, SOB, or other concerns this morning. We discussed the necessity of working with therapy to straighten his right arm or leg as they could become contracted.   Objective:  Vital signs in last 24 hours: Vitals:   01/29/19 1920 01/29/19 2323 01/30/19 0320 01/30/19 0320  BP: 134/75 100/76  112/84  Pulse: 82 69  71  Resp: 18 18  16   Temp: 99.3 F (37.4 C) 97.8 F (36.6 C)  98.5 F (36.9 C)  TempSrc: Oral Oral  Oral  SpO2: 99% 96%  100%  Weight:   126.1 kg   Height:       Constitution: NAD, supine in bed Cardio: RRR, no m/r/g Abdominal: soft, non-distended, NTTP MSK: strength 1/5 RUE and RLE, strength 5/5 left GU: urine yellow, foley catheter in   Assessment/Plan:  Principal Problem:   Cerebral thrombosis with cerebral infarction Active Problems:   Right sided weakness   Hypertension   Hyperlipidemia   Ulcer of penis   Urinary retention   Depression   Hematuria   Anemia of chronic disease  58yo M with PMH of cocaine admitted for left ACA/MCA infarcts with new RUE and RLE weakness. Disposition complicated by patient not having insurance and requiring SNF placement. He is in the process of applying for medicaid.    L ACA/MCA Infarct Again refusing to work with therapy. Discussed the importance of exercising his right side as this will become contracted over time. He endorsed understanding. He is stable for SNF placement.   - cont. Statin  - cont asa - cont. PT/OT/SLP - OOB to chair   Depression Decreased po intake secondary to being stuck in the hospital and recent debilitating stroke.   - cont. remeron and prozac  - cont. To encourage working with PT/OT and PO intake  Hematuria/Urinary Retention Acute Anemia Penile bleeding two days ago which required replacement of foley catheter and 1U transfusion for hgb 7.1 and 1L NS bolus for hypotension. Post-transfusion was 8.7 and has been stable. Urology is consulted, appreciate  assistance and recommendations.  Patient additionally had recent drop in Hgb 2/2 to GI bleed, which resolved with holding of plavix.   - cont. Foley  - daily CBCs - cont flomax - daily weights - strict I/o's  Hypertension Cont. norvasc 10 mg, hydral 75 mg tid, losartan 50 mg qd, metoprolol tartrate 25 mg bid  VTE: lovenox IVF: none Diet: regular  Code: full   Dispo: Anticipated discharge pending SNF placement.   Marty Heck, DO 01/30/2019, 6:51 AM Pager: [phone]

## 2019-01-31 DIAGNOSIS — D62 Acute posthemorrhagic anemia: Secondary | ICD-10-CM | POA: Diagnosis not present

## 2019-01-31 DIAGNOSIS — R310 Gross hematuria: Secondary | ICD-10-CM

## 2019-01-31 LAB — COMPREHENSIVE METABOLIC PANEL
ALT: 88 U/L — ABNORMAL HIGH (ref 0–44)
AST: 42 U/L — ABNORMAL HIGH (ref 15–41)
Albumin: 2.7 g/dL — ABNORMAL LOW (ref 3.5–5.0)
Alkaline Phosphatase: 58 U/L (ref 38–126)
Anion gap: 8 (ref 5–15)
BUN: 25 mg/dL — ABNORMAL HIGH (ref 6–20)
CO2: 24 mmol/L (ref 22–32)
Calcium: 8.9 mg/dL (ref 8.9–10.3)
Chloride: 111 mmol/L (ref 98–111)
Creatinine, Ser: 1.5 mg/dL — ABNORMAL HIGH (ref 0.61–1.24)
GFR calc Af Amer: 59 mL/min — ABNORMAL LOW (ref >60)
GFR calc non Af Amer: 51 mL/min — ABNORMAL LOW (ref >60)
Glucose, Bld: 113 mg/dL — ABNORMAL HIGH (ref 70–99)
Potassium: 3.8 mmol/L (ref 3.5–5.1)
Sodium: 143 mmol/L (ref 135–145)
Total Bilirubin: 0.3 mg/dL (ref 0.3–1.2)
Total Protein: 6.2 g/dL — ABNORMAL LOW (ref 6.5–8.1)

## 2019-01-31 LAB — CBC
HCT: 23.5 % — ABNORMAL LOW (ref 39.0–52.0)
HCT: 24.3 % — ABNORMAL LOW (ref 39.0–52.0)
Hemoglobin: 7.6 g/dL — ABNORMAL LOW (ref 13.0–17.0)
Hemoglobin: 7.8 g/dL — ABNORMAL LOW (ref 13.0–17.0)
MCH: 29.5 pg (ref 26.0–34.0)
MCH: 29.1 pg (ref 26.0–34.0)
MCHC: 32.3 g/dL (ref 30.0–36.0)
MCHC: 32.1 g/dL (ref 30.0–36.0)
MCV: 91.1 fL (ref 80.0–100.0)
MCV: 90.7 fL (ref 80.0–100.0)
Platelets: 292 10*3/uL (ref 150–400)
Platelets: 297 10*3/uL (ref 150–400)
RBC: 2.58 MIL/uL — ABNORMAL LOW (ref 4.22–5.81)
RBC: 2.68 MIL/uL — ABNORMAL LOW (ref 4.22–5.81)
RDW: 13 % (ref 11.5–15.5)
RDW: 13.1 % (ref 11.5–15.5)
WBC: 8.9 10*3/uL (ref 4.0–10.5)
WBC: 9.4 10*3/uL (ref 4.0–10.5)
nRBC: 0 % (ref 0.0–0.2)
nRBC: 0 % (ref 0.0–0.2)

## 2019-01-31 MED ORDER — SODIUM CHLORIDE 0.9 % IV SOLN
510.0000 mg | Freq: Once | INTRAVENOUS | Status: AC
  Administered 2019-01-31: 510 mg via INTRAVENOUS
  Filled 2019-01-31: qty 17

## 2019-01-31 NOTE — Progress Notes (Signed)
   Subjective:  [name] denies any concerns this morning. He states he has not tried to touch his catheter. He denies penile or abdominal pain.   Objective:  Vital signs in last 24 hours: Vitals:   01/30/19 2107 01/30/19 2310 01/31/19 0319 01/31/19 0427  BP:  109/76 129/74   Pulse: 93 76 68   Resp:  18 18   Temp:  99.1 F (37.3 C) 98.1 F (36.7 C)   TempSrc:  Oral Oral   SpO2: 95% 97% 98%   Weight:    125.6 kg  Height:       Constitution: NAD, supine in bed Respiratory: non-labored breathing, on room air MSK: strength 1/5 RUE and RLE, strength 5/5 left GU: urine grossly bloody today, foley catheter in place  Assessment/Plan:  Principal Problem:   Cerebral thrombosis with cerebral infarction Active Problems:   Right sided weakness   Hypertension   Hyperlipidemia   Ulcer of penis   Urinary retention   Depression   Hematuria   Anemia of chronic disease  58yo M with PMH of cocaine admitted for left ACA/MCA infarcts with new RUE and RLE weakness. Disposition complicated by patient not having insurance and requiring SNF placement. He is in the process of applying for medicaid.   L ACA/MCA Infarct Cont. Statin, asa, PT/OT/SLP, OOB to chair. Continue to encourage participation to increase strength.   Depression Decreased po intake secondary to being stuck in the hospital and recent debilitating stroke.   - cont. remeron and prozac  - cont. To encourage working with PT/OT and PO intake  Hematura Acute Anemia 2/2 urethral bleed  Previous Anemia 2/2 GI bleed in the setting of asa and plavix Per nursing he has had good Uop with about 600 cc today. Urine grossly bloody today compared to yesterday although patient endorses not touching his catheter. He has additionally been placed in mittens as he has removed his foley twice, which led to urethral bleeding. A coude cath was placed, when patient attempted to dislodge this he had further bleeding which led to symptomatic  anemia requiring 1U transfusion and bolus. He subsequently failed a voiding trial for the second time after this and a foley cath was again placed.   - urology following and to see patient again during admission per note, appreciate recommendations, otherwise will discuss further with them.  - cont. Foley  - cont. Qd CBC's, repeat CBC this evening - transfuse for Hgb <7 - cont. flomax - cont. Daily weights, and strict I/O's  - feraheme IV  - cont. SCDs  CKD Stage III Currently at baseline.   VTE: SCDs IVF: none Diet: regular  Code: full   Dispo: Anticipated discharge pending SNF placement and medicaid application.   Marty Heck, DO 01/31/2019, 6:05 AM Pager: [phone]

## 2019-02-01 DIAGNOSIS — K5900 Constipation, unspecified: Secondary | ICD-10-CM

## 2019-02-01 DIAGNOSIS — E46 Unspecified protein-calorie malnutrition: Secondary | ICD-10-CM

## 2019-02-01 LAB — CBC
HCT: 24.3 % — ABNORMAL LOW (ref 39.0–52.0)
Hemoglobin: 7.7 g/dL — ABNORMAL LOW (ref 13.0–17.0)
MCH: 29.1 pg (ref 26.0–34.0)
MCHC: 31.7 g/dL (ref 30.0–36.0)
MCV: 91.7 fL (ref 80.0–100.0)
Platelets: 310 10*3/uL (ref 150–400)
RBC: 2.65 MIL/uL — ABNORMAL LOW (ref 4.22–5.81)
RDW: 13.2 % (ref 11.5–15.5)
WBC: 8.4 10*3/uL (ref 4.0–10.5)
nRBC: 0 % (ref 0.0–0.2)

## 2019-02-01 LAB — BASIC METABOLIC PANEL
Anion gap: 8 (ref 5–15)
BUN: 20 mg/dL (ref 6–20)
CO2: 24 mmol/L (ref 22–32)
Calcium: 9 mg/dL (ref 8.9–10.3)
Chloride: 111 mmol/L (ref 98–111)
Creatinine, Ser: 1.33 mg/dL — ABNORMAL HIGH (ref 0.61–1.24)
GFR calc Af Amer: >60 mL/min (ref >60)
GFR calc non Af Amer: 59 mL/min — ABNORMAL LOW (ref >60)
Glucose, Bld: 108 mg/dL — ABNORMAL HIGH (ref 70–99)
Potassium: 3.9 mmol/L (ref 3.5–5.1)
Sodium: 143 mmol/L (ref 135–145)

## 2019-02-01 NOTE — Progress Notes (Signed)
Subjective: No complaints overnight.  Patient notes that he will not be working with physical therapy today because "I just do not want to".  He notes he does not care if he goes to a long-term skilled facility. Denies pain  Objective:  Vital signs in last 24 hours: Vitals:   01/31/19 2257 02/01/19 0301 02/01/19 0358 02/01/19 0924  BP: 119/69 128/88  (!) 149/83  Pulse: 68 69  80  Resp: 17 18  18   Temp: 98.4 F (36.9 C) 97.9 F (36.6 C)  98.7 F (37.1 C)  TempSrc: Oral Oral  Oral  SpO2: 97%   94%  Weight:   126.1 kg   Height:       Physical Exam: General: Chronically ill-appearing male Cardiac: Heart regular rate and rhythm Pulmonary: Lung sounds clear to auscultation Abdomen: Soft nontender GU: Foley catheter in place with dark urine present Summary In summary Mr. Kotowski is a 58 year old gentleman who uses cocaine daily with no recorded past medical history who presented on 12-19-18 with left ACA/MCA watershed infarcts. His current deficits are right-sided weakness and dysarthria. He completed 3 weeks of dual antiplatelet therapy. Hospital course has been complicated by bleeding issues.  These include melena which is since resolved and hematuria which is ongoing.  Urinary retention has also been an ongoing issue.  This is required him to be catheterized for the vast majority of his hospitalization.  Urology has been consulted and are currently managing this. Hospital course has also been complicated by patient's depression which is understandably likely attributable to his stroke and subsequent deficits.  Fluoxetine and Remeron have been started and patient has shown some minor improvements however he continues to refuse to work with therapy for the most part and has issues with appetite resulting in malnutrition. The patient is currently medically stable and has been optimized for discharge. However, his disposition is unknown because he is not a candidate for CIR or SNF placement  due to lack of insurance. SW and case management have been active on his case.  Chronic/stable/resolved hosp problems  RESOLVED Melena: Resolved.thought to be 2/2 anticoagulation. GI consulted 9/30. Suggested that bleed likely from anticoag and no need for colonoscopy at this time. Plan: PPI BID. will need to be set up for colonoscopy and GI appt upon d/c RESOLVED Skin ulcers. Located on the dorsal surface of the penis and posterior scrotum. Plan: Frequent repositioning, Barrier cream, Air mattress  HTN: continue losartan, metoprolol, amlodipine, hydralizine.   Assessment/Plan:  Principal Problem:   Cerebral thrombosis with cerebral infarction Active Problems:   Right sided weakness   Hypertension   Hyperlipidemia   Ulcer of penis   Urinary retention   Depression   Hematuria   Anemia of chronic disease   Acute blood loss anemia  L ACA/MCA Infarcts resulting in right sided weakness and dysarthria and developed situational related depression.   Plan Continue statin and aspirin Continue PT and OT. Continue Prozac and Remeron Baclofen 3 times daily Out of bed for minimum of 3 hours per shift.  In chair for meals.  Acute on chronic anemia.  Status post 1 unit PRBC transfusion last week.  Hemoglobin stable at 7.7 today. Urinary Retention/Hematuria.   Urology following.  Foley bag significant for very dark almost black appearing urine.   AKI.  Slowly improving. Plan Continue urinary catheter Continue Flomax Transfuse for hemoglobin less than 7 Repeat BMP and CBC in a.m.  Constipation.  Has been an intermittent issue throughout his hospitalization.  Likely multifactorial including poor diet, and immobility Plan: Continue MiraLAX twice daily, senna and as needed Dulcolax  Poor appetite/malnutrition.  Still having poor p.o. intake.  Continue folate, thiamine, multivitamin, Ensure.  CODE STATUS: Full Pain: Tylenol as needed DVT prophylaxis: Holding for bleeding GI prophylaxis:  Protonix 40 mg twice daily Disposition: CIR referral placed with [facility]. Sent referral to [facility] and bed pending acceptance.CM working with this.discussed with social work yesterday and no available at this time.  Will need follow up with neuro in 6w post d/c  Cyrah Mclamb, MD 02/01/19 11:59 AM

## 2019-02-02 LAB — CBC
HCT: 25.7 % — ABNORMAL LOW (ref 39.0–52.0)
Hemoglobin: 8 g/dL — ABNORMAL LOW (ref 13.0–17.0)
MCH: 29 pg (ref 26.0–34.0)
MCHC: 31.1 g/dL (ref 30.0–36.0)
MCV: 93.1 fL (ref 80.0–100.0)
Platelets: 328 10*3/uL (ref 150–400)
RBC: 2.76 MIL/uL — ABNORMAL LOW (ref 4.22–5.81)
RDW: 13.5 % (ref 11.5–15.5)
WBC: 7.8 10*3/uL (ref 4.0–10.5)
nRBC: 0 % (ref 0.0–0.2)

## 2019-02-02 NOTE — Progress Notes (Signed)
Nutrition Follow-up  DOCUMENTATION CODES:   Obesity unspecified  INTERVENTION:  ContinueEnsure Enlive po BID, each supplement provides 350 kcal and 20 grams of protein.  Continue30 ml Prostat po BID, each supplement provides 100 kcal and 15 grams of protein.  Encourage adequate PO intake.  NUTRITION DIAGNOSIS:   Inadequate oral intake related to lethargy/confusion as evidenced by estimated needs(reported meal completion); improved  GOAL:   Patient will meet greater than or equal to 90% of their needs; progressing  MONITOR:   PO intake, Supplement acceptance, Skin, Weight trends, Labs, I & O's  REASON FOR ASSESSMENT:   Malnutrition Screening Tool    ASSESSMENT:   58 year old man with hyperlipidemia, hypertension, and cocaine use disorder admitted with right upper and lower extremity weakness admitted with scattered acute infarcts in the medial left ACA and MCA watershed distribution.  Meal completion has been 25-100%. Pt currently has Ensure ordered with varied consumption and Prostat ordered with good compliance. RD to continue with current nutritional supplements to aid in caloric and protein needs. Pt encouraged to eat his food at meal and to consume his supplements. Labs and medications reviewed.    Diet Order:   Diet Order            Diet regular Room service appropriate? Yes with Assist; Fluid consistency: Thin  Diet effective now              EDUCATION NEEDS:   Not appropriate for education at this time  Skin:  Skin Assessment: Skin Integrity Issues: Skin Integrity Issues:: Other (Comment) Other: N/A  Last BM:  10/27  Height:   Ht Readings from Last 1 Encounters:  12/19/18 5\' 9"  (1.753 m)    Weight:   Wt Readings from Last 1 Encounters:  02/01/19 126.1 kg    Ideal Body Weight:  72.7 kg  BMI:  Body mass index is 41.05 kg/m.  Estimated Nutritional Needs:   Kcal:  2000-2200  Protein:  100-115 grams  Fluid:  >/= 2  L/day    Corrin Parker, MS, RD, LDN Pager # [phone] After hours/ weekend pager # [phone]

## 2019-02-02 NOTE — Progress Notes (Addendum)
Subjective: no complaints this morning  Objective:  Vital signs in last 24 hours: Vitals:   02/02/19 0033 02/02/19 0407 02/02/19 0728 02/02/19 1138  BP: 114/71 126/76 128/83 139/87  Pulse: 73 73 70 66  Resp: 18 19 17 16   Temp: 98.5 F (36.9 C) 98.7 F (37.1 C) 98 F (36.7 C) 99.1 F (37.3 C)  TempSrc: Oral Oral Oral Oral  SpO2: 99% 97% 99% 100%  Weight:      Height:       Physical Exam: General: No acute distress Cardiac: Regular rate and rhythm Pulmonary: Lung sounds clear to auscultation Skin: No rash GU: Foley catheter in place.  Producing dark brown urine. Summary In summary Philip Vasquez is a 58 year old gentleman who uses cocaine daily with no recorded past medical history who presented on 12-19-18 with left ACA/MCA watershed infarcts. His current deficits are right-sided weakness and dysarthria. He completed 3 weeks of dual antiplatelet therapy. Hospital course has been complicated by bleeding issues.  These include melena which is since resolved and hematuria which is ongoing.  Urinary retention has also been an ongoing issue.  This is required him to be catheterized for the vast majority of his hospitalization.  Urology has been consulted and are currently managing this. Hospital course has also been complicated by patient's depression which is understandably likely attributable to his stroke and subsequent deficits.  Fluoxetine and Remeron have been started and patient has shown some minor improvements however he continues to refuse to work with therapy for the most part and has issues with appetite resulting in malnutrition. The patient is currently medically stable and has been optimized for discharge. However, his disposition is unknown because he is not a candidate for CIR or SNF placement due to lack of insurance. SW and case management have been active on his case.  Chronic/stable/resolved hosp problems  RESOLVED Melena: Resolved.thought to be 2/2 anticoagulation. GI  consulted 9/30. Suggested that bleed likely from anticoag and no need for colonoscopy at this time. Plan: PPI BID. will need to be set up for colonoscopy and GI appt upon d/c RESOLVED Skin ulcers. Located on the dorsal surface of the penis and posterior scrotum. Plan: Frequent repositioning, Barrier cream, Air mattress  HTN: continue losartan, metoprolol, amlodipine, hydralizine.   Assessment/Plan:  Principal Problem:   Cerebral thrombosis with cerebral infarction Active Problems:   Right sided weakness   Hypertension   Hyperlipidemia   Ulcer of penis   Urinary retention   Depression   Hematuria   Anemia of chronic disease   Acute blood loss anemia  L ACA/MCA Infarcts resulting in right sided weakness and dysarthria and developed situational related depression.   Plan Continue aspirin and statin Continue PT and OT Continue Prozac and Remeron Continue baclofen 3 times daily Out of bed for minimum of 3 hours per shift.  Acute on chronic anemia. Repeat CBC pending Urinary Retention/Hematuria.   Urology following.  Foley bag significant for very dark almost black appearing urine.   AKI.  Improving Plan Continue urinary catheter Continue Flomax Transfuse for hemoglobin less than 7  Constipation.  Has been an intermittent issue throughout his hospitalization.  Likely multifactorial including poor diet, and immobility.  Bowel movement today Plan: Continue MiraLAX twice daily, senna and as needed Dulcolax  Poor appetite/malnutrition.  Still having poor p.o. intake.  Continue folate, thiamine, multivitamin, Ensure.  CODE STATUS: Full Pain: Tylenol as needed DVT prophylaxis: Holding for bleeding GI prophylaxis: Protonix 40 mg twice daily Disposition: CIR  referral placed with [facility]. Sent referral to [facility] and bed pending acceptance.CM working with this.discussed with social work yesterday and no available at this time.  Will need follow up with neuro in 6w  post d/c  Philip Washington, MD 02/02/19 11:43 AM

## 2019-02-02 NOTE — Progress Notes (Signed)
PT Cancellation Note  Patient Details Name: Philip Vasquez MRN: [id number] DOB: 02-10-1961   Cancelled Treatment:    Reason Eval/Treat Not Completed: Other (comment). Pt continues to refuse to get up OOB or work with PT. Pt educated again that we aren't helping him by letting him lay in bed and he isn't helping himself either. His response "I don't care". Pt also starting to refuse to eat as well. Spoke with RN and case management regarding care. Aware he is on LOG wait list. Acute PT to cont to attempt to work with patient weekly.  Kittie Plater, PT, DPT Acute Rehabilitation Services Pager #: [phone] Office #: [phone]    Berline Lopes 02/02/2019, 1:18 PM

## 2019-02-03 LAB — GLUCOSE, CAPILLARY
Glucose-Capillary: 151 mg/dL — ABNORMAL HIGH (ref 70–99)
Glucose-Capillary: 167 mg/dL — ABNORMAL HIGH (ref 70–99)
Glucose-Capillary: 124 mg/dL — ABNORMAL HIGH (ref 70–99)

## 2019-02-03 NOTE — Progress Notes (Signed)
Subjective: No complaints this morning.  Objective:  Vital signs in last 24 hours: Vitals:   02/02/19 2045 02/03/19 0008 02/03/19 0428 02/03/19 0818  BP: 127/69 134/87 (!) 144/79 (!) 126/102  Pulse: 83 74 66 68  Resp: 18 18 18 18   Temp: 99.1 F (37.3 C) 98.5 F (36.9 C) 98.5 F (36.9 C) 98.3 F (36.8 C)  TempSrc: Oral Oral Oral Oral  SpO2: 97% 97% 99% 99%  Weight:      Height:       Physical Exam: General: No acute distress Cardiac: Heart regular rate and rhythm Pulmonary: Lung sounds clear to auscultation GU: Foley catheter in place with dark brown urine Skin: No rash Summary In summary Philip Vasquez is a 58 year old gentleman who uses cocaine daily with no recorded past medical history who presented on 12-19-18 with left ACA/MCA watershed infarcts. His current deficits are right-sided weakness and dysarthria. He completed 3 weeks of dual antiplatelet therapy. Hospital course has been complicated by bleeding issues.  These include melena which is since resolved and hematuria which is ongoing.  Urinary retention has also been an ongoing issue.  This is required him to be catheterized for the vast majority of his hospitalization.  Urology has been consulted and are currently managing this. Hospital course has also been complicated by patient's depression which is understandably likely attributable to his stroke and subsequent deficits.  Fluoxetine and Remeron have been started and patient has shown some minor improvements however he continues to refuse to work with therapy for the most part and has issues with appetite resulting in malnutrition. The patient is currently medically stable and has been optimized for discharge. However, his disposition is unknown because he is not a candidate for CIR or SNF placement due to lack of insurance. SW and case management have been active on his case.  Chronic/stable/resolved hosp problems  RESOLVED Melena: Resolved.thought to be 2/2  anticoagulation. GI consulted 9/30. Suggested that bleed likely from anticoag and no need for colonoscopy at this time. Plan: PPI BID. will need to be set up for colonoscopy and GI appt upon d/c RESOLVED Skin ulcers. Located on the dorsal surface of the penis and posterior scrotum. Plan: Frequent repositioning, Barrier cream, Air mattress  HTN: continue losartan, metoprolol, amlodipine, hydralizine.   Assessment/Plan:  Principal Problem:   Cerebral thrombosis with cerebral infarction Active Problems:   Right sided weakness   Hypertension   Hyperlipidemia   Ulcer of penis   Urinary retention   Depression   Hematuria   Anemia of chronic disease   Acute blood loss anemia  L ACA/MCA Infarcts resulting in right sided weakness and dysarthria and developed situational related depression.  Patient still refusing to work with physical therapy Plan Continue statin and aspirin Continue PT and OT Continue Prozac and Remeron Continue baclofen 3 times daily Out of bed for minimum of 3 hours per shift  Acute on chronic anemia.  Hemoglobin stable Urinary Retention/Hematuria.   Urology following.  Will need to reach out to them later this week to discuss further plans. Plan Continue urinary catheter Continue Flomax Transfuse for hemoglobin less than 7  Constipation.  Has been an intermittent issue throughout his hospitalization.  Likely multifactorial including poor diet, and immobility.  Plan: Continue MiraLAX twice daily, senna and as needed Dulcolax  Poor appetite/malnutrition.  Still having poor p.o. intake.  Continue folate, thiamine, multivitamin, Ensure.  CODE STATUS: Full Pain: Tylenol as needed DVT prophylaxis: Holding for bleeding GI prophylaxis: Protonix 40  mg twice daily Disposition: CIR referral placed with [facility]. Sent referral to [facility] and bed pending acceptance.CM working with this.discussed with social work yesterday and no available at this time.   Will need follow up with neuro in 6w post d/c  Philip Vandehei, MD 02/03/19 11:09 AM

## 2019-02-03 NOTE — Progress Notes (Signed)
  Speech Language Pathology Treatment: Cognitive-Linquistic  Patient Details Name: Philip Vasquez MRN: [id number] DOB: Nov 14, 1960 Today's Date: 02/03/2019 Time: 2542-7062 SLP Time Calculation (min) (ACUTE ONLY): 15 min  Assessment / Plan / Recommendation Clinical Impression  Pt was encountered awake/alert lying reclined in bed.  Treatment targeting cognitive deficits on this date.  Pt completed basic verbal problem solving/safety judgement questions with 3/4 (75%) accuracy independently, improving to 100% accuracy given moderate verbal cues.  Pt exhibited the most difficulty with questions regarding medication management.  He answered min-moderately complex money management questions with 90% accuracy independently, improving to 100% given min verbal cues.  Pt additionally completed auditory attention tasks with 85% accuracy independently.  He exhibited poor eye contact with SLP throughout the session; however, he answered all questions in a timely manner.  Recommend additional ST and assistance with IADLs (medications, finances, etc.) at time of discharge.  SLP will continue to follow per POC.    HPI HPI: Pt is a 58 year old gentleman with no past medical history who presented with AMS and right-sided weakness. MRI: Widely scattered small acute infarcts in the medial lefthemisphere involving left ACA and left ACA/MCA watershed area. UA positive for cocaine use.       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

## 2019-02-04 LAB — BASIC METABOLIC PANEL
Anion gap: 11 (ref 5–15)
BUN: 15 mg/dL (ref 6–20)
CO2: 26 mmol/L (ref 22–32)
Calcium: 9.2 mg/dL (ref 8.9–10.3)
Chloride: 105 mmol/L (ref 98–111)
Creatinine, Ser: 1.36 mg/dL — ABNORMAL HIGH (ref 0.61–1.24)
GFR calc Af Amer: >60 mL/min (ref >60)
GFR calc non Af Amer: 57 mL/min — ABNORMAL LOW (ref >60)
Glucose, Bld: 103 mg/dL — ABNORMAL HIGH (ref 70–99)
Potassium: 4.1 mmol/L (ref 3.5–5.1)
Sodium: 142 mmol/L (ref 135–145)

## 2019-02-04 LAB — CBC
HCT: 26.8 % — ABNORMAL LOW (ref 39.0–52.0)
Hemoglobin: 8.5 g/dL — ABNORMAL LOW (ref 13.0–17.0)
MCH: 29.1 pg (ref 26.0–34.0)
MCHC: 31.7 g/dL (ref 30.0–36.0)
MCV: 91.8 fL (ref 80.0–100.0)
Platelets: 330 10*3/uL (ref 150–400)
RBC: 2.92 MIL/uL — ABNORMAL LOW (ref 4.22–5.81)
RDW: 14 % (ref 11.5–15.5)
WBC: 8 10*3/uL (ref 4.0–10.5)
nRBC: 0 % (ref 0.0–0.2)

## 2019-02-04 NOTE — Progress Notes (Signed)
Subjective: No overnight events.  Discussed importance of getting up out of bed however patient said he just does not want to.  Endorsed feeling depressed but no thoughts of self-harm  Objective:  Vital signs in last 24 hours: Vitals:   02/03/19 2053 02/04/19 0000 02/04/19 0400 02/04/19 0458  BP: (!) 155/90 (!) 138/93 (!) 122/94   Pulse: 89 73 71   Resp: 20 18 18    Temp: 99.4 F (37.4 C) 98.3 F (36.8 C) 98.6 F (37 C)   TempSrc: Oral Axillary Oral   SpO2: 95% 97% 98%   Weight:    126 kg  Height:       Physical Exam: General: No acute distress Cardiac: Heart regular rate and rhythm Pulmonary: Lung sounds clear to auscultation GU: Foley catheter in place with dark brown urine Skin: No rash Summary In summary Philip Vasquez is a 58 year old gentleman who uses cocaine daily with no recorded past medical history who presented on 12-19-18 with left ACA/MCA watershed infarcts. His current deficits are right-sided weakness and dysarthria. He completed 3 weeks of dual antiplatelet therapy. Hospital course has been complicated by bleeding issues.  These include melena which is since resolved and hematuria which is ongoing.  Urinary retention has also been an ongoing issue.  This is required him to be catheterized for the vast majority of his hospitalization.  Urology has been consulted and are currently managing this. Hospital course has also been complicated by patient's depression which is understandably likely attributable to his stroke and subsequent deficits.  Fluoxetine and Remeron have been started and patient has shown some minor improvements however he continues to refuse to work with therapy for the most part and has issues with appetite resulting in malnutrition. The patient is currently medically stable and has been optimized for discharge. However, his disposition is unknown because he is not a candidate for CIR or SNF placement due to lack of insurance. SW and case management have  been active on his case.  Chronic/stable/resolved hosp problems  RESOLVED Melena: Resolved.thought to be 2/2 anticoagulation. GI consulted 9/30. Suggested that bleed likely from anticoag and no need for colonoscopy at this time. Plan: PPI BID. will need to be set up for colonoscopy and GI appt upon d/c RESOLVED Skin ulcers. Located on the dorsal surface of the penis and posterior scrotum. Plan: Frequent repositioning, Barrier cream, Air mattress HTN: continue losartan, metoprolol, amlodipine, hydralizine.   Assessment/Plan:  Principal Problem:   Cerebral thrombosis with cerebral infarction Active Problems:   Right sided weakness   Hypertension   Hyperlipidemia   Ulcer of penis   Urinary retention   Depression   Hematuria   Anemia of chronic disease   Acute blood loss anemia  L ACA/MCA Infarcts resulting in right sided weakness and dysarthria and developed situational related depression.  Patient still refusing to work with physical therapy.  Patient still endorsing feeling depressed and just not wanting to do anything. Plan Continue statin and aspirin Continue PT and OT Continue Prozac and Remeron.  May consider psych consult Continue baclofen 3 times daily Out of bed for minimum of 3 hours per shift  Acute on chronic anemia.  Hemoglobin improving. Urinary Retention/Hematuria.   Urology following.   Plan Continue urinary catheter Continue Flomax Transfuse for hemoglobin less than 7  Constipation.  Has been an intermittent issue throughout his hospitalization.  Likely multifactorial including poor diet, and immobility.  Plan: Continue MiraLAX twice daily, senna and as needed Dulcolax  Poor appetite/malnutrition.  Still having poor p.o. intake.  Continue folate, thiamine, multivitamin, Ensure.  CODE STATUS: Full Pain: Tylenol as needed DVT prophylaxis: Holding for bleeding GI prophylaxis: Protonix 40 mg twice daily Disposition: CIR referral placed with [facility].  Sent referral to [facility] and bed pending acceptance.CM working with this.discussed with social work yesterday and no available at this time.  Will need follow up with neuro in 6w post d/c  Philip Dunavant, MD 02/04/19 5:53 AM

## 2019-02-04 NOTE — TOC Progression Note (Signed)
Transition of Care [facility]) - Progression Note    Patient Details  Name: Philip Vasquez MRN: [id number] Date of Birth: Nov 14, 1960  Transition [facility]) CM/SW Contact  Pollie Friar, RN Phone Number: 02/04/2019, 4:17 PM  Clinical Narrative:    Pt continues with no bed offers. CM has resent his information to SNF's in [place] and in the surrounding areas.  TOC following.   Expected Discharge Plan: IP Rehab Facility Barriers to Discharge: SNF Pending payor source - LOG, SNF Pending bed offer, Inadequate or no insurance, Active Substance Use - Placement  Expected Discharge Plan and Services Expected Discharge Plan: [name] In-house Referral: Clinical Social Work Discharge Planning Services: CM Consult                                           Social Determinants of Health (SDOH) Interventions    Readmission Risk Interventions No flowsheet data found.

## 2019-02-05 DIAGNOSIS — D649 Anemia, unspecified: Secondary | ICD-10-CM

## 2019-02-05 DIAGNOSIS — G252 Other specified forms of tremor: Secondary | ICD-10-CM

## 2019-02-05 DIAGNOSIS — B351 Tinea unguium: Secondary | ICD-10-CM

## 2019-02-05 NOTE — Progress Notes (Signed)
Subjective: No overnight events. Discussed history of intention tremor which he reports was present prior to stroke. No complaints.  Objective:  Vital signs in last 24 hours: Vitals:   02/04/19 2155 02/05/19 0100 02/05/19 0400 02/05/19 0900  BP: 129/86 (!) 135/92 133/83 (!) 158/92  Pulse: 84 67 71 82  Resp: 16 18 18 18   Temp: 98.9 F (37.2 C) 98.9 F (37.2 C) 98.9 F (37.2 C) 98.7 F (37.1 C)  TempSrc: Oral Oral Oral Oral  SpO2: 100% 99% 100% 100%  Weight:      Height:       Physical Exam: General: No acute distress Cardiac: Heart regular rate and rhythm. Lower extremities cold. Poor peripheral pulses. Pulmonary: Lung sounds clear to auscultation GU: Foley catheter in place with dark brown urine--slowly becoming lighter Neuro: upper extremity strength equal and intact. RLE weakness improving. Intention tremor (chronic per patient) present--predominently on right.  Skin: No rash. Toe nails very long and appear to have fungal disease. Summary In summary Mr. Balding is a 58 year old gentleman who uses cocaine daily with no recorded past medical history who presented on 12-19-18 with left ACA/MCA watershed infarcts. His current deficits are right-sided weakness which has significantly improved and dysarthria (also improved). He completed 3 weeks of dual antiplatelet therapy. Hospital course has been complicated by bleeding issues.  These include melena which is since resolved and hematuria which is ongoing.  Urinary retention has also been an ongoing issue.  This is required him to be catheterized for the vast majority of his hospitalization.  Urology has been consulted and are currently managing this. Hospital course has also been complicated by patient's depression which is understandably likely attributable to his stroke and subsequent deficits.  Fluoxetine and Remeron have been started and patient has shown some minor improvements however he continues to refuse to work with therapy  for the most part and has issues with appetite resulting in malnutrition. The patient is currently medically stable and has been optimized for discharge. However, his disposition is unknown because he is not a candidate for CIR or SNF placement due to lack of insurance. SW and case management have been active on his case.  Chronic/stable/resolved hosp problems  RESOLVED Melena: Resolved.thought to be 2/2 anticoagulation. GI consulted 9/30. Suggested that bleed likely from anticoag and no need for colonoscopy at this time. Plan: PPI BID. will need to be set up for colonoscopy and GI appt upon d/c RESOLVED Skin ulcers. Located on the dorsal surface of the penis and posterior scrotum. Plan: Frequent repositioning, Barrier cream, Air mattress HTN: continue losartan, metoprolol, amlodipine, hydralizine.   Assessment/Plan:  Principal Problem:   Cerebral thrombosis with cerebral infarction Active Problems:   Right sided weakness   Hypertension   Hyperlipidemia   Ulcer of penis   Urinary retention   Depression   Hematuria   Anemia of chronic disease   Acute blood loss anemia  L ACA/MCA Infarcts resulting in right sided weakness and dysarthria and developed situational related depression.  Patient still refusing to work with physical therapy.   Intention tremor.  Plan Continue statin and aspirin Continue PT and OT Continue Prozac and Remeron. Continue baclofen 3 times daily Out of bed for minimum of 3 hours per shift Will investigate history of intention tremor later today  Onchomycosis. Long toe nails. Plan: Will discuss with attending later. Floor staff do not cut toe nails. May need to consult with podiatry to avoid long term issues with overgrown nails. May also  need to discuss medical treatment for nail fungus.  Acute on chronic anemia.  Hemoglobin improving. Urinary Retention/Hematuria.   Discussed with urology yesterday--no overt concern regarding the continued dark urine--likely  still from bleed. Recommending to continue foley and have him be seen outpatient once discharged. Plan Continue urinary catheter Continue Flomax Transfuse for hemoglobin less than 7  Constipation.  Has been an intermittent issue throughout his hospitalization.  Likely multifactorial including poor diet, and immobility.  Plan: Continue MiraLAX twice daily, senna and as needed Dulcolax  Poor appetite/malnutrition.  Still having poor p.o. intake.  Continue folate, thiamine, multivitamin, Ensure.  CODE STATUS: Full Pain: Tylenol as needed DVT prophylaxis: Holding for bleeding GI prophylaxis: Protonix 40 mg twice daily Disposition: Medicaid still pending. apps resent to SNFs in [place]. Unlikely to be inpatient rehab candidate due to refusing therapy.  Will need follow up with neuro in 6w post d/c  Elige Radon, MD 02/05/19 12:35 PM

## 2019-02-05 NOTE — Plan of Care (Signed)
Patient progressing towards plan of care goals. 

## 2019-02-06 NOTE — Progress Notes (Signed)
Subjective: No overnight events  Objective:  Vital signs in last 24 hours: Vitals:   02/05/19 2348 02/06/19 0407 02/06/19 0427 02/06/19 0754  BP: 136/83 135/88  (!) 140/102  Pulse: 87 79  82  Resp: 20 18  14   Temp: 98.3 F (36.8 C) 98.4 F (36.9 C)  98.3 F (36.8 C)  TempSrc: Oral   Oral  SpO2: 96% 95%  94%  Weight:   102 kg   Height:       Physical Exam: General: No acute distress Cardiac: Heart regular rate and rhythm.  Pulmonary: Lung sounds clear to auscultation GU: Foley catheter present. Neuro: Alert and oriented Skin: No rash.  Summary In summary Mr. Baines is a 58 year old gentleman who uses cocaine daily with no recorded past medical history who presented on 12-19-18 with left ACA/MCA watershed infarcts. His current deficits are right-sided weakness which has significantly improved and dysarthria (also improved). He completed 3 weeks of dual antiplatelet therapy. Hospital course has been complicated by bleeding issues.  These include melena which is since resolved and hematuria which is ongoing.  Urinary retention has also been an ongoing issue.  This is required him to be catheterized for the vast majority of his hospitalization.  Urology has been consulted and are currently managing this. Hospital course has also been complicated by patient's depression which is understandably likely attributable to his stroke and subsequent deficits.  Fluoxetine and Remeron have been started and patient has shown some minor improvements however he continues to refuse to work with therapy for the most part and has issues with appetite resulting in malnutrition. The patient is currently medically stable and has been optimized for discharge. However, his disposition is unknown because he is not a candidate for CIR or SNF placement due to lack of insurance. SW and case management have been active on his case.  Chronic/stable/resolved hosp problems  RESOLVED Melena: Resolved.thought to be  2/2 anticoagulation. GI consulted 9/30. Suggested that bleed likely from anticoag and no need for colonoscopy at this time. Plan: PPI BID. will need to be set up for colonoscopy and GI appt upon d/c RESOLVED Skin ulcers. Located on the dorsal surface of the penis and posterior scrotum. Plan: Frequent repositioning, Barrier cream, Air mattress HTN: continue losartan, metoprolol, amlodipine, hydralizine.   Assessment/Plan:  Principal Problem:   Cerebral thrombosis with cerebral infarction Active Problems:   Right sided weakness   Hypertension   Hyperlipidemia   Ulcer of penis   Urinary retention   Depression   Hematuria   Anemia of chronic disease   Acute blood loss anemia  L ACA/MCA Infarcts resulting in right sided weakness and dysarthria and developed situational related depression.  Patient still refusing to work with physical therapy.   Intention tremor.  Plan Continue statin and aspirin Continue PT and OT Continue Prozac and Remeron. Continue baclofen 3 times daily  Onchomycosis. Long toe nails. Plan: will call podiatry on Monday to see if they can come see him while hospitalized since he will be here for an extended period of time  Chronic anemia.  Hemoglobin stable Urinary Retention/Hematuria.   Discussed with urology. Recommending to continue foley and have him be seen outpatient once discharged. Plan Continue urinary catheter Continue Flomax Transfuse for hemoglobin less than 7  Constipation.  Has been an intermittent issue throughout his hospitalization.  Likely multifactorial including poor diet, and immobility.  Plan: Continue MiraLAX twice daily, senna and as needed Dulcolax  Poor appetite/malnutrition.  Still having poor  p.o. intake.  Continue folate, thiamine, multivitamin, Ensure.  CODE STATUS: Full Pain: Tylenol as needed DVT prophylaxis: Holding for bleeding GI prophylaxis: Protonix 40 mg twice daily Disposition: Medicaid still pending. apps resent to  SNFs in . Unlikely to be inpatient rehab candidate due to refusing therapy.  Case management following.  Will need follow up with neuro in 6w post d/c  Moksh Loomer, MD 02/06/19 11:56 AM

## 2019-02-06 NOTE — Plan of Care (Signed)
  Problem: Education: Goal: Knowledge of General Education information will improve Description Including pain rating scale, medication(s)/side effects and non-pharmacologic comfort measures Outcome: Progressing   Problem: Health Behavior/Discharge Planning: Goal: Ability to manage health-related needs will improve Outcome: Progressing   Problem: Clinical Measurements: Goal: Ability to maintain clinical measurements within normal limits will improve Outcome: Progressing Goal: Will remain free from infection Outcome: Progressing Goal: Diagnostic test results will improve Outcome: Progressing Goal: Respiratory complications will improve Outcome: Progressing Goal: Cardiovascular complication will be avoided Outcome: Progressing   Problem: Activity: Goal: Risk for activity intolerance will decrease Outcome: Progressing   Problem: Nutrition: Goal: Adequate nutrition will be maintained Outcome: Progressing   Problem: Coping: Goal: Level of anxiety will decrease Outcome: Progressing   Problem: Elimination: Goal: Will not experience complications related to bowel motility Outcome: Progressing Goal: Will not experience complications related to urinary retention Outcome: Progressing   Problem: Pain Managment: Goal: General experience of comfort will improve Outcome: Progressing   Problem: Safety: Goal: Ability to remain free from injury will improve Outcome: Progressing   Problem: Skin Integrity: Goal: Risk for impaired skin integrity will decrease Outcome: Progressing   Problem: Education: Goal: Knowledge of disease or condition will improve Outcome: Progressing Goal: Knowledge of secondary prevention will improve Outcome: Progressing Goal: Knowledge of patient specific risk factors addressed and post discharge goals established will improve Outcome: Progressing Goal: Individualized Educational Video(s) Outcome: Progressing   Problem: Coping: Goal: Will verbalize  positive feelings about self Outcome: Progressing Goal: Will identify appropriate support needs Outcome: Progressing   Problem: Health Behavior/Discharge Planning: Goal: Ability to manage health-related needs will improve Outcome: Progressing   Problem: Self-Care: Goal: Ability to participate in self-care as condition permits will improve Outcome: Progressing Goal: Verbalization of feelings and concerns over difficulty with self-care will improve Outcome: Progressing Goal: Ability to communicate needs accurately will improve Outcome: Progressing   Problem: Nutrition: Goal: Risk of aspiration will decrease Outcome: Progressing Goal: Dietary intake will improve Outcome: Progressing   Problem: Ischemic Stroke/TIA Tissue Perfusion: Goal: Complications of ischemic stroke/TIA will be minimized Outcome: Progressing  [place]ascus Feldpausch, BSN, RN 

## 2019-02-06 NOTE — Progress Notes (Signed)
Occupational Therapy Treatment Patient Details Name: Philip Vasquez MRN: [id number] DOB: 10-15-1960 Today's Date: 02/06/2019    History of present illness Mr. Barthold is a 58 year old gentleman with no past medical history who presented with AMS and right-sided weakness. MRI: Widely scattered small acute infarcts in the medial lefthemisphere involving left ACA and left ACA/MCA watershed area.   OT comments  Pt. Seen for skilled OT session.  Focus of session was grooming tasks. Pt. Completed oral care, face washing, and applying lotion. Varying levels of min and mod a.  Max encouragement for participation and completion of tasks.    Follow Up Recommendations  SNF;Supervision/Assistance - 24 hour    Equipment Recommendations  Wheelchair (measurements OT);Wheelchair cushion (measurements OT)    Recommendations for Other Services Rehab consult    Precautions / Restrictions Precautions Precautions: Fall Precaution Comments: apneic, R UE/LE weakness       Mobility Bed Mobility               General bed mobility comments: pt. assisted with scooting up in bed, followed instructions to push with b les and moved towards hob. postioned bed in semi seated position. he only agreed during the session then wanted the controls to place in more supine at end of session even with encouragement.  Transfers                      Balance                                           ADL either performed or assessed with clinical judgement   ADL Overall ADL's : Needs assistance/impaired     Grooming: Oral care Grooming Details (indicate cue type and reason): applied lotion to both hands, face                               General ADL Comments: pt. a little more agreeable this session.  required max coaxing, encouragement, and explanation to participate.  provided each grooming item ie: put lotion on his hands and said okay now go ahead and rub it in. pt.  followed one step commands with increased time.  spoke to him about trying to participate and remain motivated, he became tearful and nodded yes     Vision       Perception     Praxis      Cognition Arousal/Alertness: Awake/alert Behavior During Therapy: Flat affect                           Following Commands: Follows multi-step commands inconsistently;Follows one step commands with increased time       General Comments: Pt responding to questions appropriately, decreased awareness of deficits and functional impact        Exercises     Shoulder Instructions       General Comments      Pertinent Vitals/ Pain       Pain Assessment: No/denies pain Pain Location: R UE Pain Descriptors / Indicators: Grimacing  Home Living  Prior Functioning/Environment              Frequency  Min 2X/week        Progress Toward Goals  OT Goals(current goals can now be found in the care plan section)  Progress towards OT goals: Progressing toward goals     Plan Discharge plan remains appropriate    Co-evaluation                 AM-PAC OT "6 Clicks" Daily Activity     Outcome Measure   Help from another person eating meals?: A Lot Help from another person taking care of personal grooming?: A Lot Help from another person toileting, which includes using toliet, bedpan, or urinal?: Total Help from another person bathing (including washing, rinsing, drying)?: Total Help from another person to put on and taking off regular upper body clothing?: A Lot Help from another person to put on and taking off regular lower body clothing?: Total 6 Click Score: 9    End of Session    OT Visit Diagnosis: Other abnormalities of gait and mobility (R26.89);Muscle weakness (generalized) (M62.81);Low vision, both eyes (H54.2);Other symptoms and signs involving cognitive function;Cognitive communication deficit  (R41.841);Hemiplegia and hemiparesis Symptoms and signs involving cognitive functions: Cerebral infarction Hemiplegia - Right/Left: Right Hemiplegia - dominant/non-dominant: Dominant Hemiplegia - caused by: Cerebral infarction   Activity Tolerance     Patient Left in bed;with call bell/phone within reach;with bed alarm set   Nurse Communication          Time: 1024-1040 OT Time Calculation (min): 16 min  Charges: OT General Charges $OT Visit: 1 Visit OT Treatments $Self Care/Home Management : 8-22 mins  Janice Coffin, COTA/L 02/06/2019, 1:46 PM

## 2019-02-06 NOTE — TOC Progression Note (Signed)
Transition of Care [facility]) - Progression Note    Patient Details  Name: Philip Vasquez MRN: [id number] Date of Birth: Aug 06, 1960  [facility] CM/SW [place], [place] Phone Number: 02/06/2019, 10:57 AM  Clinical Narrative:     CSW is following for SNF. The patient does not have any current bed offers.   CSW will continue to follow for disposition decision.   Expected Discharge Plan: IP Rehab Facility Barriers to Discharge: SNF Pending payor source - LOG, SNF Pending bed offer, Inadequate or no insurance, Active Substance Use - Placement  Expected Discharge Plan and Services Expected Discharge Plan: [place] In-house Referral: Clinical Social Work Discharge Planning Services: CM Consult                                           Social Determinants of Health (SDOH) Interventions    Readmission Risk Interventions No flowsheet data found.

## 2019-02-06 NOTE — Plan of Care (Signed)
Patient progressing towards plan of care goals. 

## 2019-02-07 DIAGNOSIS — Z8719 Personal history of other diseases of the digestive system: Secondary | ICD-10-CM

## 2019-02-07 DIAGNOSIS — F4321 Adjustment disorder with depressed mood: Secondary | ICD-10-CM

## 2019-02-07 NOTE — Progress Notes (Signed)
Subjective: No acute events overnight. Denies pain, n/v.   Objective:  Vital signs in last 24 hours: Vitals:   02/06/19 1343 02/06/19 1814 02/07/19 0007 02/07/19 0511  BP: (!) 160/94 (!) 141/82 133/87 (!) 148/85  Pulse: 77 84 83 70  Resp: 19 18 16 18   Temp: 99.3 F (37.4 C) 98.3 F (36.8 C) 98.6 F (37 C) 98.5 F (36.9 C)  TempSrc: Oral Oral Oral Oral  SpO2: 98% 96% 96% 98%  Weight:      Height:       General: awake, alert, NAD CV: RRR Pulm: CTAB GU: foley catheter present  Neuro: Alert and oriented; answers questions appropriately  Psych: blunted affect    Summary In summary Mr. Hanway is a 58 year old gentleman who uses cocaine daily with no recorded past medical history who presented on 12-19-18 with left ACA/MCA watershed infarcts. His current deficits are right-sided weakness which has significantly improved and dysarthria (also improved). He completed 3 weeks of dual antiplatelet therapy. Hospital course has been complicated by bleeding issues.  These include melena which is since resolved and hematuria which is ongoing.  Urinary retention has also been an ongoing issue.  This is required him to be catheterized for the vast majority of his hospitalization.  Urology has been consulted and are currently managing this. Hospital course has also been complicated by patient's depression which is understandably likely attributable to his stroke and subsequent deficits.  Fluoxetine and Remeron have been started and patient has shown some minor improvements however he continues to refuse to work with therapy for the most part and has issues with appetite resulting in malnutrition. The patient is currently medically stable and has been optimized for discharge. However, his disposition is unknown because he is not a candidate for CIR or SNF placement due to lack of insurance. SW and case management have been active on his case.  Chronic/stable/resolved hosp problems  RESOLVED  Melena: Resolved.thought to be 2/2 anticoagulation. GI consulted 9/30. Suggested that bleed likely from anticoag and no need for colonoscopy at this time. Plan: PPI BID. will need to be set up for colonoscopy and GI appt upon d/c RESOLVED Skin ulcers. Located on the dorsal surface of the penis and posterior scrotum. Plan: Frequent repositioning, Barrier cream, Air mattress HTN: continue losartan, metoprolol, amlodipine, hydralizine.   Assessment/Plan:  Principal Problem:   Cerebral thrombosis with cerebral infarction Active Problems:   Right sided weakness   Hypertension   Hyperlipidemia   Ulcer of penis   Urinary retention   Depression   Hematuria   Anemia of chronic disease   Acute blood loss anemia  L ACA/MCA Infarcts resulting in right sided weakness and dysarthria and developed situational related depression.  Intention tremor.  Plan Continue statin and aspirin Continue PT and OT; did well participating with OT yesterday  Continue Prozac and Remeron. Continue baclofen 3 times daily  Onchomycosis. Long toe nails.  Plan: will call podiatry on Monday to see if they can come see him while hospitalized since he will be here for an extended period of time  Chronic anemia.  Hemoglobin stable Urinary Retention/Hematuria.   Discussed with urology. Recommending to continue foley and have him be seen outpatient once discharged. Plan Continue urinary catheter Continue Flomax Transfuse for hemoglobin less than 7  Constipation.  Has been an intermittent issue throughout his hospitalization.  Likely multifactorial including poor diet, and immobility.  Plan: Continue MiraLAX twice daily, senna and as needed Dulcolax  Poor appetite/malnutrition.  Still having poor p.o. intake.  Continue folate, thiamine, multivitamin, Ensure.  Dispo: Anticipated discharge pending SNF placement and medicaid application.   Modena Nunnery D, DO 02/07/2019, 7:11 AM Pager: [phone]

## 2019-02-07 NOTE — Progress Notes (Signed)
Blood clots noted in foley bag. On-call MD notified and are aware. VSS, NAD noted.

## 2019-02-07 NOTE — TOC Progression Note (Signed)
Transition of Care [facility]) - Progression Note    Patient Details  Name: Philip Vasquez MRN: [id number] Date of Birth: 1960-04-28  Transition of [facility] CM/SW [place], [place] Phone Number: 02/07/2019, 12:24 PM  Clinical Narrative:     No current bed offers.   CSW will continue to follow and assist with disposition planning.   Expected Discharge Plan: IP Rehab Facility Barriers to Discharge: SNF Pending payor source - LOG, SNF Pending bed offer, Inadequate or no insurance, Active Substance Use - Placement  Expected Discharge Plan and Services Expected Discharge Plan: [place] In-house Referral: Clinical Social Work Discharge Planning Services: CM Consult                                           Social Determinants of Health (SDOH) Interventions    Readmission Risk Interventions No flowsheet data found.

## 2019-02-08 LAB — CBC
HCT: 27.9 % — ABNORMAL LOW (ref 39.0–52.0)
Hemoglobin: 8.8 g/dL — ABNORMAL LOW (ref 13.0–17.0)
MCH: 29 pg (ref 26.0–34.0)
MCHC: 31.5 g/dL (ref 30.0–36.0)
MCV: 92.1 fL (ref 80.0–100.0)
Platelets: 294 10*3/uL (ref 150–400)
RBC: 3.03 MIL/uL — ABNORMAL LOW (ref 4.22–5.81)
RDW: 13.9 % (ref 11.5–15.5)
WBC: 7.4 10*3/uL (ref 4.0–10.5)
nRBC: 0 % (ref 0.0–0.2)

## 2019-02-08 NOTE — Progress Notes (Signed)
Subjective: Blood clot in foley noted last evening. No other overnight events.  Objective:  Vital signs in last 24 hours: Vitals:   02/07/19 2036 02/08/19 0013 02/08/19 0138 02/08/19 0411  BP: (!) 148/87 119/78  (!) 132/93  Pulse: 88 70  65  Resp: 18 18  18   Temp: 98.4 F (36.9 C) 98.7 F (37.1 C)  98.6 F (37 C)  TempSrc: Oral Oral    SpO2: 95% 95%  97%  Weight:   126 kg   Height:       Physical Exam: General: Chronically ill appearing male Cardiac: Heart regular rate and rhythm Pulmonary: Lung sounds clear to auscultation Abdomen: Bowel sounds active Skin: No rash.  Summary In summary Philip Vasquez is a 58 year old gentleman who uses cocaine daily with no recorded past medical history who presented on 12-19-18 with left ACA/MCA watershed infarcts. His current deficits are right-sided weakness which has significantly improved and dysarthria (also improved). He completed 3 weeks of dual antiplatelet therapy. Hospital course has been complicated by bleeding issues.  These include melena which is since resolved and hematuria which is ongoing.  Urinary retention has also been an ongoing issue.  This is required him to be catheterized for the vast majority of his hospitalization.  Urology has been consulted and are currently managing this. Hospital course has also been complicated by patient's depression which is understandably likely attributable to his stroke and subsequent deficits.  Fluoxetine and Remeron have been started and patient has shown some minor improvements however he continues to refuse to work with therapy for the most part and has issues with appetite resulting in malnutrition. The patient is currently medically stable and has been optimized for discharge. However, his disposition is unknown because he is not a candidate for CIR or SNF placement due to lack of insurance. SW and case management have been active on his case.  Chronic/stable/resolved hosp problems   RESOLVED Melena: Resolved.thought to be 2/2 anticoagulation. GI consulted 9/30. Suggested that bleed likely from anticoag and no need for colonoscopy at this time. Plan: PPI BID. will need to be set up for colonoscopy and GI appt upon d/c RESOLVED Skin ulcers. Located on the dorsal surface of the penis and posterior scrotum. Plan: Frequent repositioning, Barrier cream, Air mattress HTN: continue losartan, metoprolol, amlodipine, hydralizine.   Assessment/Plan:  Principal Problem:   Cerebral thrombosis with cerebral infarction Active Problems:   Right sided weakness   Hypertension   Hyperlipidemia   Ulcer of penis   Urinary retention   Depression   Hematuria   Anemia of chronic disease   Acute blood loss anemia  L ACA/MCA Infarcts resulting in right sided weakness and dysarthria and developed situational related depression.  Patient still refusing to work with physical therapy.   Plan Continue statin and aspirin Continue PT and OT Continue Prozac and Remeron. Continue baclofen 3 times daily  Onchomycosis. Long toe nails. Plan: will call podiatry today to see if they can come see him while hospitalized since he will be here for an extended period of time  Chronic anemia.  Hemoglobin stable Urinary Retention/Hematuria.   Discussed with urology. Recommending to continue foley and have him be seen outpatient once discharged. Plan Continue urinary catheter Continue Flomax  Constipation.  Has been an intermittent issue throughout his hospitalization.  Likely multifactorial including poor diet, and immobility.  Plan: Continue MiraLAX twice daily, senna and as needed Dulcolax  Poor appetite/malnutrition.  Still having poor p.o. intake.  Continue folate,  thiamine, multivitamin, Ensure.  CODE STATUS: Full Pain: Tylenol as needed DVT prophylaxis: Holding for bleeding GI prophylaxis: Protonix 40 mg twice daily Disposition: Medicaid still pending. apps resent to SNFs in [place]. Case  management following.  Will need follow up with neuro in 6w post d/c  Philip Fabio, MD 02/08/19 5:24 AM

## 2019-02-08 NOTE — Progress Notes (Signed)
  Speech Language Pathology Treatment: Cognitive-Linquistic  Patient Details Name: Philip Vasquez MRN: [id number] DOB: 09-29-1960 Today's Date: 02/08/2019 Time: 0623-7628 SLP Time Calculation (min) (ACUTE ONLY): 15 min  Assessment / Plan / Recommendation Clinical Impression  Pt was encountered awake/alert, lying reclined in bed.  Pt initially would not verbally respond to questions and would only head nod in response to indicate yes/no.  Yes/no responses were appropriate.  He was oriented to self and situation; however, he was not oriented to the year until given a choice of two.  Verbalizations and sustained attention increased when talking about a topic of interest (football).  Pt recalled which teams played a game this weekend, the score of one of the games, and he completed accurate mental math to determine the difference between the amount of points each team scored.  Pt additionally completed a sustained attention task that required him to look at numbers 1-5 scattered across a page and connect them in order via drawing a line.  He completed this task with 100% accuracy independently.  SLP will continue to follow up for cognitive-linguistic treatment per POC.     HPI HPI: Pt is a 58 year old gentleman with no past medical history who presented with AMS and right-sided weakness. MRI: Widely scattered small acute infarcts in the medial lefthemisphere involving left ACA and left ACA/MCA watershed area. UA positive for cocaine use.       SLP Plan  Continue with current plan of care       Recommendations                   Oral Care Recommendations: Oral care BID Follow up Recommendations: 24 hour supervision/assistance;Inpatient Rehab SLP Visit Diagnosis: Cognitive communication deficit (B15.176) Plan: Continue with current plan of care       Colin Mulders., M.S., [place] Office: [phone]             [place] 02/08/2019, 12:27 PM

## 2019-02-09 LAB — TYPE AND SCREEN
ABO/RH(D): O POS
Antibody Screen: NEGATIVE

## 2019-02-09 NOTE — Progress Notes (Signed)
Subjective: Visited with patient's brother and Philip Vasquez yesterday afternoon. Philip Vasquez was eating a cheeseburger and fries. Brother noted that he is working with an attorney to get things moving along with placement--there is some kind of issue between SSI, disability and medicaid. He noted that he plans to come up to the floor today to have temporary power of attorney signed over to him so he can access some things to assist in moving the process along.  He also voiced his happiness in seeing his brother improving.  Objective:  Vital signs in last 24 hours: Vitals:   02/08/19 1653 02/08/19 2043 02/08/19 2344 02/09/19 0327  BP: 113/75 133/87 107/72 107/79  Pulse: 71 79 63 70  Resp: 17 18 16 16   Temp: 98.9 F (37.2 C) 98.1 F (36.7 C) 99 F (37.2 C) 99 F (37.2 C)  TempSrc: Oral Oral Oral Oral  SpO2: 96% 100% 99% 97%  Weight:      Height:       Physical Exam: General: No acute distress Cardiac: Heart regular rate and rhythm Lungs: Clear to auscultation Abdomen: Bowel sounds active.  Nontender Skin: No rash   Summary In summary Philip Vasquez is a 58 year old gentleman who uses cocaine daily with no recorded past medical history who presented on 12-19-18 with left ACA/MCA watershed infarcts. His current deficits are right-sided weakness which has significantly improved and dysarthria (also improved). He completed 3 weeks of dual antiplatelet therapy. Hospital course has been complicated by bleeding issues.  These include melena which is since resolved and hematuria which is ongoing.  Urinary retention has also been an ongoing issue.  This is required him to be catheterized for the vast majority of his hospitalization.  Urology has been consulted and recommended continued Foley with plans for outpatient follow-up. Hospital course has also been complicated by patient's depression which is understandably likely attributable to his stroke and subsequent deficits.  Fluoxetine and Remeron have been  started and patient has shown some minor improvements however he continues to refuse to work with therapy for the most part and has issues with appetite resulting in malnutrition. The patient is currently medically stable and has been optimized for discharge. However, his disposition is unknown because he is not a candidate for CIR or SNF placement due to lack of insurance. SW and case management have been active on his case.  Chronic/stable/resolved hosp problems  RESOLVED Melena: Resolved.thought to be 2/2 anticoagulation. GI consulted 9/30. Suggested that bleed likely from anticoag and no need for colonoscopy at this time. Plan: PPI BID. will need to be set up for colonoscopy and GI appt upon d/c RESOLVED Skin ulcers. Located on the dorsal surface of the penis and posterior scrotum. Plan: Frequent repositioning, Barrier cream, Air mattress  Assessment/Plan:  Principal Problem:   Cerebral thrombosis with cerebral infarction Active Problems:   Right sided weakness   Hypertension   Hyperlipidemia   Ulcer of penis   Urinary retention   Depression   Hematuria   Anemia of chronic disease   Acute blood loss anemia  L ACA/MCA Infarcts resulting in right sided weakness and dysarthria and developed situational related depression.  Patient still refusing to work with physical therapy.   Plan Continue statin and aspirin Continue PT and OT Continue Prozac and Remeron. Continue baclofen 3 times daily  Onchomycosis. Long toe nails. Unfortunately, does not appear that podiatry services are available in hospital. Will need podiatry follow up at discharge.  Chronic anemia.  Hemoglobin stable Urinary Retention/Hematuria.  Discussed with urology. Recommending to continue foley and have him be seen outpatient post discharge. Plan. Continue urinary catheter and Flomax  Constipation.  Has been an intermittent issue throughout his hospitalization.  Likely multifactorial including poor diet, and  immobility.  Plan: Continue MiraLAX twice daily, senna and as needed Dulcolax  Poor appetite/malnutrition.  Still having poor p.o. intake.  Continue folate, thiamine, multivitamin, Ensure.  HTN: stable. continue losartan, metoprolol, amlodipine, hydralizine.   CODE STATUS: Full Pain: Tylenol as needed DVT prophylaxis: Holding for bleeding GI prophylaxis: Protonix 40 mg twice daily Disposition: Medicaid still pending. apps resent to SNFs in [place]. Case management following.  Will need follow up with neuro in 6w post d/c  [name], MD 02/09/19 5:46 AM

## 2019-02-10 NOTE — Progress Notes (Signed)
Physical Therapy Treatment Patient Details Name: Philip Vasquez MRN: [id number] DOB: September 11, 1960 Today's Date: 02/10/2019    History of Present Illness Philip Vasquez is a 58 year old gentleman with no past medical history who presented with AMS and right-sided weakness. MRI: Widely scattered small acute infarcts in the medial lefthemisphere involving left ACA and left ACA/MCA watershed area.    PT Comments     Pt agreeable for bed level therapeutic exercises only (refusing out of bed mobility). Focused on strengthening, range of motion, and sustained stretching (see below). Noted minimal right ankle dorsiflexion; stronger in RUE than RLE with pain > 90 degree shoulder flexion. Will continue to progress as tolerated.      Follow Up Recommendations  Supervision/Assistance - 24 hour;SNF     Equipment Recommendations  Other (comment)(defer)    Recommendations for Other Services       Precautions / Restrictions Precautions Precautions: Fall Precaution Comments: apneic, R UE/LE weakness Restrictions Weight Bearing Restrictions: No    Mobility  Bed Mobility               General bed mobility comments: refused by pt  Transfers                    Ambulation/Gait                 Stairs             Wheelchair Mobility    Modified Rankin (Stroke Patients Only) Modified Rankin (Stroke Patients Only) Pre-Morbid Rankin Score: Moderate disability Modified Rankin: Severe disability     Balance                                            Cognition Arousal/Alertness: Awake/alert Behavior During Therapy: Flat affect Overall Cognitive Status: Impaired/Different from baseline Area of Impairment: Following commands;Safety/judgement;Problem solving                   Current Attention Level: Sustained   Following Commands: Follows multi-step commands inconsistently;Follows one step commands with increased time Safety/Judgement:  Decreased awareness of safety;Decreased awareness of deficits Awareness: Intellectual Problem Solving: Difficulty sequencing;Requires verbal cues;Requires tactile cues;Slow processing General Comments: Pt with limited verbalizations, following simple commands consistently      Exercises General Exercises - Upper Extremity Shoulder Flexion: AAROM;Right;10 reps;Other (comment)(to ~90 deg in pain free range) Elbow Flexion: AROM;Right;10 reps;Supine General Exercises - Lower Extremity Ankle Circles/Pumps: PROM;5 reps;Right;Supine Heel Slides: AROM;PROM;Both;10 reps;Supine Hip ABduction/ADduction: Left;10 reps;Supine Straight Leg Raises: AROM;Left;10 reps;Supine Hip Flexion/Marching: PROM;Right;10 reps;Supine Other Exercises Other Exercises: Supine: right hamstring stretch x 1 minute    General Comments        Pertinent Vitals/Pain Pain Assessment: Faces Faces Pain Scale: Hurts little more Pain Location: RLE with PROM Pain Descriptors / Indicators: Grimacing Pain Intervention(s): Monitored during session    Home Living                      Prior Function            PT Goals (current goals can now be found in the care plan section) Acute Rehab PT Goals Patient Stated Goal: "to relax." PT Goal Formulation: With patient Time For Goal Achievement: 02/24/19 Potential to Achieve Goals: Fair    Frequency    Min 1X/week      PT Plan Current plan remains appropriate  Co-evaluation              AM-PAC PT "6 Clicks" Mobility   Outcome Measure  Help needed turning from your back to your side while in a flat bed without using bedrails?: A Lot Help needed moving from lying on your back to sitting on the side of a flat bed without using bedrails?: Total Help needed moving to and from a bed to a chair (including a wheelchair)?: Total Help needed standing up from a chair using your arms (e.g., wheelchair or bedside chair)?: Total Help needed to walk in  hospital room?: Total Help needed climbing 3-5 steps with a railing? : Total 6 Click Score: 7    End of Session   Activity Tolerance: Patient tolerated treatment well Patient left: in bed;with call bell/phone within reach   PT Visit Diagnosis: Other abnormalities of gait and mobility (R26.89);Hemiplegia and hemiparesis;Muscle weakness (generalized) (M62.81) Hemiplegia - Right/Left: Right     Time: 9563-8756 PT Time Calculation (min) (ACUTE ONLY): 16 min  Charges:  $Therapeutic Exercise: 8-22 mins                     Laurina Bustle, PT, DPT Acute Rehabilitation Services Pager [phone] Office [phone]    Vanetta Mulders 02/10/2019, 4:24 PM

## 2019-02-10 NOTE — Progress Notes (Signed)
Paged Chaplain to speak with brother about POA papers. Awaiting call back.

## 2019-02-10 NOTE — Progress Notes (Signed)
  Speech Language Pathology Treatment: Cognitive-Linquistic  Patient Details Name: Philip Vasquez MRN: [id number] DOB: Aug 13, 1960 Today's Date: 02/10/2019 Time: 9528-4132 SLP Time Calculation (min) (ACUTE ONLY): 15 min  Assessment / Plan / Recommendation Clinical Impression  Pt was encountered awake/alert, lying reclined in bed.  Pt reported 9/10 leg pain and he exhibited an episode of full body shaking x1.  RN was made aware.  Treatment focused on cognitive abilities on this date.  Pt continues to exhibit poor eye contact with SLP; however,  he answered all questions independently.  Pt completed functional problem solving questions targeting money and time management.  Pt answered moderately challenging money management questions with 5/8 (63%) accuracy independently, improving to 7/8 (88%) accuracy given moderate verbal cues and 100% accuracy given max cues.  Pt completed mildly challenging time based problem solving questions with 2/5 (40%) accuracy independently, improving to 5/5 (100%) accuracy given repetitions and moderate verbal cues.  Suspect that decreased sustained attention affected accuracy.  Recommend continued ST targeting cognitive-linguistic deficits and 24/7 supervision/assistance with IADLs (medications, finances, etc.) at time of discharge.     HPI HPI: Pt is a 58 year old gentleman with no past medical history who presented with AMS and right-sided weakness. MRI: Widely scattered small acute infarcts in the medial lefthemisphere involving left ACA and left ACA/MCA watershed area. UA positive for cocaine use.       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

## 2019-02-10 NOTE — Progress Notes (Signed)
Subjective: no overnight events. No complaints. Notes that his mood is feeling improved.  Objective:  Vital signs in last 24 hours: Vitals:   02/09/19 1948 02/09/19 2333 02/10/19 0354 02/10/19 0810  BP: 129/81 (!) 120/91 120/75 (!) 159/90  Pulse: 71 (!) 58 62 93  Resp: 17 17 17 16   Temp: 98.9 F (37.2 C) 98 F (36.7 C) 98 F (36.7 C) 98.1 F (36.7 C)  TempSrc: Oral Oral Oral Oral  SpO2: 99% 99% 100%   Weight:      Height:       Physical Exam: General: No acute distress Cardiac: Heart regular rate and rhythm Lungs: Clear to auscultation Abdomen: Bowel sounds active.  Nontender Skin: No rash   Summary In summary Mr. Nordin is a 58 year old gentleman who uses cocaine daily with no recorded past medical history who presented on 12-19-18 with left ACA/MCA watershed infarcts. His current deficits are right-sided weakness which has significantly improved and dysarthria (also improved). He completed 3 weeks of dual antiplatelet therapy. Hospital course has been complicated by bleeding issues.  These include melena which is since resolved and hematuria which is ongoing.  Urinary retention has also been an ongoing issue.  This is required him to be catheterized for the vast majority of his hospitalization.  Urology has been consulted and recommended continued Foley with plans for outpatient follow-up. Hospital course has also been complicated by patient's depression which is understandably likely attributable to his stroke and subsequent deficits.  Fluoxetine and Remeron have been started and patient has shown some minor improvements however he continues to refuse to work with therapy for the most part and has issues with appetite resulting in malnutrition. The patient is currently medically stable and has been optimized for discharge. However, his disposition is unknown because he is not a candidate for CIR or SNF placement due to lack of insurance. SW and case management have been active  on his case.  Chronic/stable/resolved hosp problems  RESOLVED Melena: Resolved.thought to be 2/2 anticoagulation. GI consulted 9/30. Suggested that bleed likely from anticoag and no need for colonoscopy at this time. Plan: PPI BID. will need to be set up for colonoscopy and GI appt upon d/c RESOLVED Skin ulcers. Located on the dorsal surface of the penis and posterior scrotum. Plan: Frequent repositioning, Barrier cream, Air mattress  Assessment/Plan:  Principal Problem:   Cerebral thrombosis with cerebral infarction Active Problems:   Right sided weakness   Hypertension   Hyperlipidemia   Ulcer of penis   Urinary retention   Depression   Hematuria   Anemia of chronic disease   Acute blood loss anemia  L ACA/MCA Infarcts resulting in right sided weakness and dysarthria and developed situational related depression.  Patient still refusing to work with physical therapy.   Plan Continue aspirin and statin Continue PT and OT Continue Prozac and Remeron Continue baclofen  Onchomycosis. Long toe nails. Unfortunately, does not appear that podiatry services are available in hospital. Will need podiatry follow up at discharge.  Chronic anemia.  Hemoglobin stable Urinary Retention/Hematuria.   Discussed with urology. Recommending to continue foley and have him be seen outpatient post discharge. Plan. Continue urinary catheter and Flomax  Constipation.  Has been an intermittent issue throughout his hospitalization.  Likely multifactorial including poor diet, and immobility.  Plan: Continue MiraLAX twice daily, senna and as needed Dulcolax  Poor appetite/malnutrition.  Still having poor p.o. intake.  Continue folate, thiamine, multivitamin, Ensure.  HTN: stable. continue losartan, metoprolol, amlodipine,  hydralizine.   CODE STATUS: Full Pain: Tylenol as needed DVT prophylaxis: Holding for bleeding GI prophylaxis: Protonix 40 mg twice daily Disposition: Medicaid still pending. apps  resent to SNFs in . Case management following.  Will need follow up with neuro in 6w post d/c  Marily Konczal, MD 02/10/19 11:21 AM

## 2019-02-10 NOTE — Progress Notes (Signed)
Nutrition Follow-up  RD working remotely.   DOCUMENTATION CODES:   Obesity unspecified  INTERVENTION:  - continue Ensure Enlive BID and 30 ml prostat BID. - continue to encourage PO intakes.    NUTRITION DIAGNOSIS:   Inadequate oral intake related to lethargy/confusion as evidenced by estimated needs(reported meal completion). -resolving  GOAL:   Patient will meet greater than or equal to 90% of their needs -met  MONITOR:   PO intake, Supplement acceptance, Skin, Weight trends, Labs, I & O's  ASSESSMENT:   58 year old man with hyperlipidemia, hypertension, and cocaine use disorder admitted with right upper and lower extremity weakness admitted with scattered acute infarcts in the medial left ACA and MCA watershed distribution.  Per flow sheet documentation, patient consumed 100% of breakfast and 50% of lunch on 10/31; 80% of breakfast, 50% of lunch, and 0% of dinner on 11/2; 100% of all meals on 11/3. Per review of orders, patient has accepted Ensure ~50% of the time offered and prostat ~75% of the time offered since RD assessment on 10/27.   Per notes: - L ACA/MCA resulting in R-sided weakness and dysarthria--refusing to work with PT - onchomycosis--need for outpatient podiatry appointment - chronic anemia - constipation--intermittent; bowel regimen ordered - Medicaid pending  - will need Neuro follow-up 6 weeks after d/c   Labs reviewed; last draw for BMP was on 10/29. Medications reviewed; 1 mg folvite/day, daily multivitamin with minerals, 40 mg oral protonix BID, 1 packet miralax BID, 2 tablets senokot/day, 100 mg oral thiamine/day.     NUTRITION - FOCUSED PHYSICAL EXAM:  unable to complete at this time.   Diet Order:   Diet Order            Diet regular Room service appropriate? Yes with Assist; Fluid consistency: Thin  Diet effective now              EDUCATION NEEDS:   Not appropriate for education at this time  Skin:  Skin Assessment: Skin  Integrity Issues: Skin Integrity Issues:: Other (Comment) Other: N/A  Last BM:  11/3  Height:   Ht Readings from Last 1 Encounters:  12/19/18 '5\' 9"'$  (1.753 m)    Weight:   Wt Readings from Last 1 Encounters:  02/08/19 126 kg    Ideal Body Weight:  72.7 kg  BMI:  Body mass index is 41.02 kg/m.  Estimated Nutritional Needs:   Kcal:  2000-2200  Protein:  100-115 grams  Fluid:  >/= 2 L/day      Jarome Matin, MS, RD, LDN, [facility] Inpatient Clinical Dietitian Pager # [phone] After hours/weekend pager # [phone]

## 2019-02-11 NOTE — Progress Notes (Signed)
   Subjective: worked with therapy yesterday. No overnight events.  Objective:  Vital signs in last 24 hours: Vitals:   02/10/19 1600 02/10/19 1918 02/10/19 2358 02/11/19 0426  BP: 132/82 126/82 133/80 126/73  Pulse: 79 86 69 67  Resp:  17 17 17   Temp: 99.1 F (37.3 C) 98.5 F (36.9 C) 98.6 F (37 C) 98.6 F (37 C)  TempSrc: Oral Oral Oral Oral  SpO2: 96% 97% 99% 97%  Weight:      Height:       Physical Exam: General: No acute distress Cardiac: Heart regular rate and rhythm Lungs: Clear to auscultation Abdomen: Bowel sounds active.  Nontender Skin: No rash   Summary In summary Philip Vasquez is a 58 year old gentleman who uses cocaine daily with no recorded past medical history who presented on 12-19-18 with left ACA/MCA watershed infarcts. His current deficits are right-sided weakness which has significantly improved and dysarthria (also improved). He completed 3 weeks of dual antiplatelet therapy. Hospital course has been complicated by bleeding issues.  These include melena which is since resolved and hematuria which is ongoing.  Urinary retention has also been an ongoing issue.  This is required him to be catheterized for the vast majority of his hospitalization.  Urology has been consulted and recommended continued Foley with plans for outpatient follow-up. Hospital course has also been complicated by patient's depression which is understandably likely attributable to his stroke and subsequent deficits.  Fluoxetine and Remeron have been started and patient has shown some minor improvements however he continues to refuse to work with therapy for the most part and has issues with appetite resulting in malnutrition. The patient is currently medically stable and has been optimized for discharge. However, his disposition is unknown because he is not a candidate for CIR or SNF placement due to lack of insurance. SW and case management have been active on his case.   Assessment/Plan:   Principal Problem:   Cerebral thrombosis with cerebral infarction Active Problems:   Right sided weakness   Hypertension   Hyperlipidemia   Ulcer of penis   Urinary retention   Depression   Hematuria   Anemia of chronic disease   Acute blood loss anemia  L ACA/MCA Infarcts resulting in right sided weakness and dysarthria and developed situational related depression.  Patient still refusing to work with physical therapy.   Plan Continue aspirin and statin Continue PT and OT Continue Prozac and Remeron Continue baclofen  Chronic anemia.  Hemoglobin stable Urinary Retention/Hematuria.   Discussed with urology. Recommending to continue foley and have him be seen outpatient post discharge. Plan. Continue urinary catheter and Flomax  Constipation.  Has been an intermittent issue throughout his hospitalization.  Likely multifactorial including poor diet, and immobility.  Plan: Continue MiraLAX twice daily, senna and as needed Dulcolax  Poor appetite/malnutrition.  Still having poor p.o. intake.  Continue folate, thiamine, multivitamin, Ensure.  HTN: stable. continue losartan, metoprolol, amlodipine, hydralizine.   CODE STATUS: Full Pain: Tylenol as needed DVT prophylaxis: Holding for bleeding GI prophylaxis: Protonix 40 mg twice daily Disposition: Medicaid still pending. apps resent to SNFs in [place]. Case management and SW following.  Will need follow up with neuro in 6w post d/c. Will need podiatry appt at discharge. Will need GI appt for colonoscopy at discharge.  Mitzi Hansen, MD 02/11/19 5:07 AM

## 2019-02-11 NOTE — Plan of Care (Signed)
°  Problem: Coping: °Goal: Level of anxiety will decrease °Outcome: Progressing °  °

## 2019-02-11 NOTE — Progress Notes (Signed)
Responded to page for AD. Family was at bedside. I checked in with Nurse and she said she noticed some family concerns about Leroys brother. I checked in with Percell and gave him a copy of the AD. His Bother continued to speak for him, although I noticed Jerami is able to talk. I redirected the conversation to [place]. After Micheal (His Brother) left the room, Nirav request a Chaplain visit without his brothers presence. He also said that he was not going to fill the AD out just yet and needed some time to think about it. Carsyn requested a Chaplain follow up tomorrow or when family is not present.   Chaplain Resident Fidel Levy [phone]

## 2019-02-12 NOTE — Progress Notes (Signed)
Subjective: Chaplain note from yesterday suggests that Philip Vasquez is not completely on board with signing AD and needed some time to think about it. Chaplain to follow up today without family present.  Objective:  Vital signs in last 24 hours: Vitals:   02/11/19 2032 02/12/19 0056 02/12/19 0337 02/12/19 0421  BP: 104/66 (!) 115/91 (!) 129/98   Pulse: 73 (!) 59 67   Resp: 18 18 17    Temp: 98.3 F (36.8 C) 98.2 F (36.8 C) 98.1 F (36.7 C)   TempSrc: Oral Oral Oral   SpO2: 96% 97% 100%   Weight:    124 kg  Height:       Physical Exam: General: in no acute distress Cardiac: RRR Pulm: lung sounds clear Abd: bs active. Non tender GU: foley in place Neuro: alert and oriented   Summary In summary Mr. Mundorf is a 58 year old gentleman who uses cocaine daily with no recorded past medical history who presented on 12-19-18 with left ACA/MCA watershed infarcts. His current deficits are right-sided weakness which has significantly improved and dysarthria (also improved). He completed 3 weeks of dual antiplatelet therapy. Hospital course has been complicated by bleeding issues.  These include melena which is since resolved and hematuria which is ongoing.  Urinary retention has also been an ongoing issue.  This is required him to be catheterized for the vast majority of his hospitalization.  Urology has been consulted and recommended continued Foley with plans for outpatient follow-up. Hospital course has also been complicated by patient's depression which is understandably likely attributable to his stroke and subsequent deficits.  Fluoxetine and Remeron have been started and patient has shown some minor improvements however he continues to refuse to work with therapy for the most part and has issues with appetite resulting in malnutrition. The patient is currently medically stable and has been optimized for discharge. However, his disposition is unknown because he is not a candidate for CIR or SNF  placement due to lack of insurance. SW and case management have been active on his case.   Assessment/Plan:  Principal Problem:   Cerebral thrombosis with cerebral infarction Active Problems:   Right sided weakness   Hypertension   Hyperlipidemia   Ulcer of penis   Urinary retention   Depression   Hematuria   Anemia of chronic disease   Acute blood loss anemia  L ACA/MCA Infarcts resulting in right sided weakness and dysarthria and developed situational related depression.  Patient still refusing to work with physical therapy.   Plan Continue aspirin and statin Continue PT/OT Continue Prozac and Remeron Continue baclofen  Chronic anemia.  Hemoglobin stable Urinary Retention/Hematuria.   Discussed with urology. Recommending to continue foley and have him be seen outpatient post discharge. Plan. Continue urinary catheter and Flomax  Constipation.  Has been an intermittent issue throughout his hospitalization.  Likely multifactorial including poor diet, and immobility.  Plan: Continue MiraLAX twice daily, senna and as needed Dulcolax  HTN: stable. continue losartan, metoprolol, amlodipine, hydralizine.   CODE STATUS: Full Pain: Tylenol as needed DVT prophylaxis: Holding for bleeding GI prophylaxis: Protonix 40 mg twice daily Family/Communication: see chaplain's note 11/5. Will need to make sure brother's intentions are aligned with Puneet's wishes Disposition: Medicaid still pending. apps resent to SNFs in [place]. Case management and SW following.  Will need follow up with neuro in 6w post d/c. Will need podiatry appt at discharge. Will need GI appt for colonoscopy at discharge.  Mitzi Hansen, MD 02/12/19 6:00 AM

## 2019-02-12 NOTE — Progress Notes (Signed)
I Followed up with spiritual support for [facility]. No family present at this time. Daaiel seemed agitated, but was talking. Midway through our conversation he seemed a little more agitated. I asked him how he felt. He said he feel better and if could come back at a later time. He said he was thankful for the contact and he requested that Chaplain return at a later time. Chaplain will follow up.   Chaplain Resident  Fidel Levy  [phone]

## 2019-02-13 NOTE — Progress Notes (Signed)
Paged by RN regarding tremors and penile pain. On examination. Philip Vasquez was observed resting comfortably in bed. He enorses some pain of his right hand on full extension but no other pain. Denies any distress from tremors. Per chart review, had tremors prior to admission per day team. Scheduled to f/u with neuro outpatient.  Gen: Well-developed, NAD, HEENT: NCAT head, hearing intact, EOMI GU: External penis intact w/ foley in place. Foley showing sanguinous drainage. Skin: Dry, Warm, normal turgor, no rashes, lesions, wounds.  Neuro: Mild resting tremors, some intentional tremors during neuro exam. RLE 1/5, LLE 3/5. Cranial nerves intact  A/P: Shown to have intention tremors w/ Right sided weakness. Likely sequelae of known stroke. Low concern for new stroke. NOT A SEIZURE as he is alert and able to follow directions while having tremors - F/u with neuro outpatient - Monitor for now

## 2019-02-13 NOTE — Progress Notes (Signed)
Occupational Therapy Treatment Patient Details Name: Philip Vasquez MRN: [id number] DOB: [birth year] Today's Date: 02/13/2019    History of present illness [name] is a 58 year old gentleman with no past medical history who presented with AMS and right-sided weakness. MRI: Widely scattered small acute infarcts in the medial lefthemisphere involving left ACA and left ACA/MCA watershed area.   OT comments  Pt participated in OT session. Some hand over hand given for ADL tasks. Feel pt will continue to benefit from acute OT to increase independence.   Follow Up Recommendations  SNF;Supervision/Assistance - 24 hour    Equipment Recommendations  Wheelchair (measurements OT);Other (comment);Wheelchair cushion (measurements OT)(TBD at next venue)    Recommendations for Other Services      Precautions / Restrictions Precautions Precautions: Fall Restrictions Weight Bearing Restrictions: No       Mobility Bed Mobility               General bed mobility comments: adjusted in bed and pt able to do so. OT adjusted pad.  Transfers                 General transfer comment: not assessed    Balance                                           ADL either performed or assessed with clinical judgement   ADL Overall ADL's : Needs assistance/impaired     Grooming: Applying deodorant;Wash/dry face;Oral care;Brushing hair;Minimal assistance;Bed level Grooming Details (indicate cue type and reason): hand over hand given for putting on deodorant; pt using Rt hand in session  Upper Body Bathing: Moderate assistance;Bed level Upper Body Bathing Details (indicate cue type and reason): hand over hand given for UB bathing (washed arms/armpits)       Upper Body Dressing Details (indicate cue type and reason): OT managed snaps on gown.                   General ADL Comments: Perfomed grooming and UB bathing tasks in session. Pt used RUE in session. some hand  over hand given.     Vision       Perception     Praxis      Cognition Arousal/Alertness: Awake/alert Behavior During Therapy: WFL for tasks assessed/performed;Flat affect Overall Cognitive Status: Impaired/Different from baseline Area of Impairment: Following commands;Attention;Orientation                 Orientation Level: Disoriented to;Time     Following Commands: Follows one step commands with increased time       General Comments: OT turned off TV as pt distracted by it.         Exercises  Performed AROM/AAROM right shoulder flexion; digit composite flexion/extension Rt hand.   Shoulder Instructions       General Comments      Pertinent Vitals/ Pain       Pain Assessment: Faces Faces Pain Scale: Hurts whole lot Pain Location: RUE with some shoulder movement and back Pain Descriptors / Indicators: Grimacing Pain Intervention(s): Monitored during session(notified nurse of back pain)  Home Living                                          Prior Functioning/Environment  Frequency  Min 2X/week        Progress Toward Goals  OT Goals(current goals can now be found in the care plan section)  Progress towards OT goals: Progressing toward goals  Acute Rehab OT Goals Patient Stated Goal: not stated OT Goal Formulation: (did not discuss with pt) Time For Goal Achievement: 02/27/19 Potential to Achieve Goals: Fair ADL Goals Pt Will Perform Grooming: with set-up;sitting Pt Will Perform Upper Body Bathing: with set-up;sitting Pt Will Transfer to Toilet: stand pivot transfer;bedside commode;with mod assist Pt/caregiver will Perform Home Exercise Program: Increased ROM;Right Upper extremity;With written HEP provided Additional ADL Goal #1: Pt will be able to roll left and right with min A to help with basic ADLs Additional ADL Goal #2: Pt will be able to come up to sit EOB with min A in prep for transfers and EOB  ADLs Additional ADL Goal #3: Pt will scan environment for appropriate items for functional tasks with minimal cueing.  Plan Discharge plan remains appropriate    Co-evaluation                 AM-PAC OT "6 Clicks" Daily Activity     Outcome Measure   Help from another person eating meals?: A Little Help from another person taking care of personal grooming?: A Little Help from another person toileting, which includes using toliet, bedpan, or urinal?: A Lot Help from another person bathing (including washing, rinsing, drying)?: A Little Help from another person to put on and taking off regular upper body clothing?: A Lot Help from another person to put on and taking off regular lower body clothing?: A Lot 6 Click Score: 15    End of Session    OT Visit Diagnosis: Hemiplegia and hemiparesis;Other symptoms and signs involving cognitive function Symptoms and signs involving cognitive functions: Cerebral infarction Hemiplegia - Right/Left: Right Hemiplegia - dominant/non-dominant: Dominant   Activity Tolerance Patient tolerated treatment well   Patient Left in bed;with bed alarm set;with call bell/phone within reach   Nurse Communication Other (comment)(pt wanting coke to drink; back pain)        Time: 1042-1100 OT Time Calculation (min): 18 min  Charges: OT General Charges $OT Visit: 1 Visit OT Treatments $Self Care/Home Management : 8-22 mins    Kayce Betty L Ameisha Mcclellan OTR/L 02/13/2019, 11:22 AM

## 2019-02-13 NOTE — Progress Notes (Signed)
RN assessed pt tremors and spasms seems to be worse and frequent; pt report pain in his penis; pt tremors noted to be worse when trying to have a BM. RN paged and MD with no response;  MD on-call paged again and RN able to notify MD and reported off to oncoming RN. Delia Heady RN

## 2019-02-13 NOTE — Progress Notes (Signed)
   Subjective: somewhat more verbal this morning. No overnight events  Objective:  Vital signs in last 24 hours: Vitals:   02/12/19 1827 02/12/19 2016 02/12/19 2332 02/13/19 0348  BP: 134/80 126/66 102/64 (!) 144/82  Pulse: 78 80 73 69  Resp: 18 18 18 18   Temp: 98.8 F (37.1 C) 98.6 F (37 C) 98.5 F (36.9 C) 98.4 F (36.9 C)  TempSrc: Oral Oral Oral Oral  SpO2: 99% 98% 100% 99%  Weight:      Height:       Physical Exam: General: No acute distress Cardiac: Heart regular rate and rhythm Pulmonary: Lung sounds clear to auscultation Abdomen: Bowel sounds active GU: Foley in place   Summary In summary Philip Vasquez is a 58 year old gentleman who uses cocaine daily with no recorded past medical history who presented on 12-19-18 with left ACA/MCA watershed infarcts. His current deficits are right-sided weakness which has significantly improved and dysarthria (also improved). He completed 3 weeks of dual antiplatelet therapy. Hospital course has been complicated by bleeding issues.  These include melena which is since resolved and hematuria which is ongoing.  Urinary retention has also been an ongoing issue.  This is required him to be catheterized for the vast majority of his hospitalization.  Urology has been consulted and recommended continued Foley with plans for outpatient follow-up. Hospital course has also been complicated by patient's depression which is understandably likely attributable to his stroke and subsequent deficits. Fluoxetine and Remeron have been started and patient has shown improvement. The patient is currently medically stable and has been optimized for discharge. However, his disposition is unknown because he is not a candidate for CIR or SNF placement due to lack of insurance. SW and case management have been active on his case.   Assessment/Plan:  Principal Problem:   Cerebral thrombosis with cerebral infarction Active Problems:   Right sided weakness  Hypertension   Hyperlipidemia   Ulcer of penis   Urinary retention   Depression   Hematuria   Anemia of chronic disease   Acute blood loss anemia  L ACA/MCA Infarcts resulting in right sided weakness and dysarthria and developed situational related depression.  Patient still refusing to work with physical therapy.   Plan Continue aspirin and statin Continue PT and OT Continue Prozac and Remeron Continue baclofen  Chronic anemia.  Hemoglobin stable Urinary Retention/Hematuria.   Urology recommending to continue foley and have him be seen outpatient post discharge. Plan. Continue urinary catheter and Flomax  Constipation.  Has been an intermittent issue throughout his hospitalization.  Likely multifactorial including poor diet, and immobility.  Plan: Continue MiraLAX twice daily, senna and as needed Dulcolax  HTN: stable. continue losartan, metoprolol, amlodipine, hydralizine.   CODE STATUS: Full Pain: Tylenol as needed DVT prophylaxis: SCDs GI prophylaxis: Protonix 40 mg twice daily Disposition: Medicaid still pending. apps resent to SNFs in [place]. Case management and SW following.  Will need follow up with neuro in 6w post d/c. Will need podiatry appt at discharge. Will need GI appt for colonoscopy at discharge.  Philip Hansen, MD 02/13/19 5:24 AM

## 2019-02-14 NOTE — TOC Progression Note (Signed)
Transition of Care [facility]) - Progression Note    Patient Details  Name: Philip Vasquez MRN: [id number] Date of Birth: 1960/08/15  Transition of Care [facility]) CM/SW [place], [place] Phone Number: 02/14/2019, 10:19 AM  Clinical Narrative:     No current bed offers. CSW will continue to follow and assist with disposition planning.    Expected Discharge Plan: IP Rehab Facility Barriers to Discharge: SNF Pending payor source - LOG, SNF Pending bed offer, Inadequate or no insurance, Active Substance Use - Placement  Expected Discharge Plan and Services Expected Discharge Plan: [place] In-house Referral: Clinical Social Work Discharge Planning Services: CM Consult                                           Social Determinants of Health (SDOH) Interventions    Readmission Risk Interventions No flowsheet data found.

## 2019-02-14 NOTE — Progress Notes (Signed)
   Subjective: no overnight events. No complaints. VSS.  Objective:  Vital signs in last 24 hours: Vitals:   02/14/19 0312 02/14/19 0340 02/14/19 0551 02/14/19 0916  BP:  120/79  133/85  Pulse:  62  72  Resp:  18  18  Temp:  98 F (36.7 C)  98.5 F (36.9 C)  TempSrc:  Oral  Oral  SpO2:  97%  100%  Weight: 124 kg  126 kg   Height:       Physical Exam: General: laying in bed Cardiac: RRR Pulmonary:lungs clear Abdomen: bs active GU: Foley in place   Summary In summary Philip Vasquez is a 58 year old gentleman who uses cocaine daily with no recorded past medical history who presented on 12-19-18 with left ACA/MCA watershed infarcts. His current deficits are right-sided weakness which has significantly improved and dysarthria (also improved). He completed 3 weeks of dual antiplatelet therapy. Hospital course has been complicated by bleeding issues.  These include melena which is since resolved and hematuria which is ongoing.  Urinary retention has also been an ongoing issue.  This is required him to be catheterized for the vast majority of his hospitalization.  Urology has been consulted and recommended continued Foley with plans for outpatient follow-up. Hospital course has also been complicated by patient's depression which is understandably likely attributable to his stroke and subsequent deficits. Fluoxetine and Remeron have been started and patient has shown improvement. The patient is currently medically stable and has been optimized for discharge. However, his disposition is unknown because he is not a candidate for CIR or SNF placement due to lack of insurance. SW and case management have been active on his case.   Assessment/Plan:  Principal Problem:   Cerebral thrombosis with cerebral infarction Active Problems:   Right sided weakness   Hypertension   Hyperlipidemia   Ulcer of penis   Urinary retention   Depression   Hematuria   Anemia of chronic disease   Acute blood  loss anemia  L ACA/MCA Infarcts resulting in right sided weakness and dysarthria and developed situational related depression. Patient working with therapy a little more often. Plan Continue aspirin and statin Continue PT and OT Continue Prozac and Remeron Continue baclofen  Chronic anemia.  Hemoglobin stable Urinary Retention/Hematuria.   Urology recommending to continue foley and have him be seen outpatient post discharge. Plan.  Continue Foley and Flomax  Constipation.  Has been an intermittent issue throughout his hospitalization.  Likely multifactorial including poor diet, and immobility.  Plan: Continue MiraLAX twice daily, senna and as needed Dulcolax HTN: stable. continue losartan, metoprolol, amlodipine, hydralizine. Melena: Resolved  CODE STATUS: Full Pain: Tylenol as needed DVT prophylaxis: SCDs GI prophylaxis: Protonix 40 mg twice daily Disposition: Medicaid still pending. apps resent to SNFs in [place]. Case management and SW following.  Will need follow up with neuro in 6w post d/c. Will need podiatry appt at discharge. Will need GI appt for colonoscopy at discharge.  Philip Hansen, MD 02/14/19 9:55 AM

## 2019-02-15 MED ORDER — PANTOPRAZOLE SODIUM 40 MG PO TBEC
40.0000 mg | DELAYED_RELEASE_TABLET | Freq: Every day | ORAL | Status: DC
  Administered 2019-02-16 – 2019-06-14 (×119): 40 mg via ORAL
  Filled 2019-02-15 (×119): qty 1

## 2019-02-15 NOTE — Progress Notes (Signed)
   Subjective: no overnight events  Objective:  Vital signs in last 24 hours: Vitals:   02/14/19 2011 02/14/19 2319 02/15/19 0344 02/15/19 0412  BP: (!) 135/91 115/76 (!) 170/92   Pulse: 95 73 75   Resp: 18 18 20    Temp: 99.6 F (37.6 C) 98.8 F (37.1 C) 98.2 F (36.8 C)   TempSrc: Oral Oral Oral   SpO2: 99% 95% 99%   Weight:    125 kg  Height:       Physical Exam: General: resting in bed Cardiac: heart RRR Pulmonary: lung sounds clear Abdomen: bowel sounds active GU: foley catheter   Summary In summary Philip Vasquez is a 58 year old gentleman who uses cocaine daily with no recorded past medical history who presented on 12-19-18 with left ACA/MCA watershed infarcts. His current deficits are right-sided weakness which has significantly improved and dysarthria (also improved). He completed 3 weeks of dual antiplatelet therapy. Hospital course has been complicated by bleeding issues.  These include melena which is since resolved and hematuria which is ongoing.  Urinary retention has also been an ongoing issue.  This is required him to be catheterized for the vast majority of his hospitalization.  Urology has been consulted and recommended continued Foley with plans for outpatient follow-up. Hospital course has also been complicated by patient's depression which is understandably likely attributable to his stroke and subsequent deficits. Fluoxetine and Remeron have been started and patient has shown improvement. The patient is currently medically stable and has been optimized for discharge. However, his disposition is unknown because he is not a candidate for CIR or SNF placement due to lack of insurance. SW and case management have been active on his case.   Assessment/Plan:  Principal Problem:   Cerebral thrombosis with cerebral infarction Active Problems:   Right sided weakness   Hypertension   Hyperlipidemia   Ulcer of penis   Urinary retention   Depression   Hematuria  Anemia of chronic disease   Acute blood loss anemia  L ACA/MCA Infarcts resulting in right sided weakness and dysarthria and developed situational related depression. Patient working with therapy a little more often. Plan Continue aspirin and statin Continue prozac and remeron Continue PT and OT Continue baclofen  Chronic anemia.  Hemoglobin stable Urinary Retention/Hematuria.   Urology recommending to continue foley and have him be seen outpatient post discharge. Plan.continue foley and flomax  Constipation.  Has been an intermittent issue throughout his hospitalization.  Likely multifactorial including poor diet, and immobility.  Plan: Continue MiraLAX twice daily, senna and as needed Dulcolax HTN: stable. continue losartan, metoprolol, amlodipine, hydralizine. Melena: Resolved  CODE STATUS: Full Pain: Tylenol as needed DVT prophylaxis: SCDs GI prophylaxis: Protonix 40 mg twice daily Disposition: Medicaid still pending. apps resent to SNFs in [place]. No offers yet. Case management and SW following.   Will need follow up with neuro in 6w post d/c. Will need podiatry appt at discharge. Will need GI appt for colonoscopy at discharge.  Mitzi Hansen, MD 02/15/19 5:20 AM

## 2019-02-15 NOTE — Progress Notes (Signed)
Pt.'s brother came to the nurses station cursing at the staff and also dropping the f-bomb in the hallway. Security called as per staff this has been an on-going issue with the patient's brother cursing and threatening staff. D/W AD Gwyndolyn Saxon, requesting security to escort him out of the hospital. Per DD have security/[employer] ban him from the premises. [employer] and security on the unit and they walked him off the unit.

## 2019-02-15 NOTE — Progress Notes (Signed)
At approximately 1120am patient's mother called nurse stating that the patient's brother called her stating that we were "leaving him in his poop all day" Nurse explained to patient's mother that it was untrue because we regularly round and the last time the nurse or CNA was in the patient's room was within the hour.   When nurse arrived to patients room right after the phone call, patient's brother was in the hallway yelling at the nurse to clean his brother up "now, because he had been laying in it too long". The brother had just gotten up to the floor minutes before berating the staff. Nurse explained to brother what she had also explained to his mother, that the nurse was just in the room not long ago.   As nurse was in the room cleaning up the patient with CNA, a bed alarm went off in a nearby patient's room who had just fallen this morning. The nurse left the CNA with patient and rushed to check on the patient room with the alarm going off. When nurse returned to [name] room his brother was in the hallway using inappropriate language  and yelling loudly, disrupting nearby patients. Nurse tried to explain to [name] brother that there was an emergency and that is why she had to leave (time frame was about 3-4 minutes). [name] brother refused to listen and continued to be loud in the hallway even when asked to lower his voice in consideration for other patients nearby. Security was called.  Security questioned [name] who is alert and oriented and he nodded "yes" or shook his head for "no".  The patient answered that he was satisfied with the nursing care and did not want to change his nurse. He agreed that the staff was taking good care of him.   Patient's brother was banned by security.

## 2019-02-16 LAB — URINALYSIS, ROUTINE W REFLEX MICROSCOPIC
Bilirubin Urine: NEGATIVE
Glucose, UA: NEGATIVE mg/dL
Ketones, ur: NEGATIVE mg/dL
Nitrite: POSITIVE — AB
Protein, ur: 100 mg/dL — AB
RBC / HPF: >50 RBC/hpf — ABNORMAL HIGH (ref 0–5)
Specific Gravity, Urine: 1.024 (ref 1.005–1.030)
WBC, UA: >50 WBC/hpf — ABNORMAL HIGH (ref 0–5)
pH: 5 (ref 5.0–8.0)

## 2019-02-16 MED ORDER — SODIUM CHLORIDE 0.9 % IV SOLN: 1.0000 g | INTRAVENOUS | Status: DC

## 2019-02-16 MED ORDER — BACLOFEN 10 MG PO TABS
5.0000 mg | ORAL_TABLET | Freq: Two times a day (BID) | ORAL | Status: DC
  Administered 2019-02-16 – 2019-06-13 (×234): 5 mg via ORAL
  Filled 2019-02-16 (×127): qty 1
  Filled 2019-02-16: qty 0.5
  Filled 2019-02-16 (×110): qty 1

## 2019-02-16 MED ORDER — SODIUM CHLORIDE 0.9 % IV SOLN
1.0000 g | INTRAVENOUS | Status: AC
  Administered 2019-02-16 – 2019-02-22 (×6): 1 g via INTRAVENOUS
  Filled 2019-02-16 (×7): qty 10

## 2019-02-16 MED ORDER — HYDROCERIN EX CREA
TOPICAL_CREAM | Freq: Two times a day (BID) | CUTANEOUS | Status: DC
  Administered 2019-02-16 – 2019-03-01 (×26): via TOPICAL
  Administered 2019-03-01: 1 via TOPICAL
  Administered 2019-03-02 – 2019-03-22 (×42): via TOPICAL
  Administered 2019-04-15 – 2019-05-30 (×5): 1 via TOPICAL
  Filled 2019-02-16 (×8): qty 113

## 2019-02-16 NOTE — Progress Notes (Signed)
Dr. Koleen Distance paged about patient having increased pain in abdomen and penis. Patient said pain was 10/10 and got worse when he urinated. Foley leaking around site, still bloody. [place]

## 2019-02-16 NOTE — Progress Notes (Signed)
Occupational Therapy Treatment Patient Details Name: Philip Vasquez MRN: [id number] DOB: 1960-08-16 Today's Date: 02/16/2019    History of present illness Mr. Moist is a 58 year old gentleman with no past medical history who presented with AMS and right-sided weakness. MRI: Widely scattered small acute infarcts in the medial lefthemisphere involving left ACA and left ACA/MCA watershed area.   OT comments  Pt up in chair and agreeable to working with OT. Worked on AA/AROM to [employer] and grooming tasks.  Pt with some apraxia, especially had difficulty bringing hands together to work on bil tasks. Once placed, he was successful in using them together.     Follow Up Recommendations  SNF;Supervision/Assistance - 24 hour    Equipment Recommendations  Wheelchair (measurements OT);Other (comment);Wheelchair cushion (measurements OT)(? mechanical lift)    Recommendations for Other Services      Precautions / Restrictions Precautions Precautions: Fall Precaution Comments: apneic, R UE/LE weakness Restrictions Weight Bearing Restrictions: No       Mobility Bed Mobility                  Transfers                 General transfer comment: OOB in chair by PT    Balance                                           ADL either performed or assessed with clinical judgement   ADL         Grooming Details (indicate cue type and reason): min A for deodorant; mod A for lotion on hands; min A to wash face.  Pt initially apraxic trying to open lotion:  attempted to use mouth.  repositioned container in R hand and hand over hand brought L to lid. He was then able to open it.  Pt had difficulty bringing hands together to use together.  Initially brought object in L hand more to L away from right hand.  Pt did reposition washcloth x 2 with extra time when washing face, to get clean portion of cloth towards face         Upper Body Dressing : Minimal assistance;Moderate  assistance;Sitting                           Vision   Additional Comments: turned head bil and reached for or placed adl supplies in OT's hand   Perception     Praxis      Cognition Arousal/Alertness: Awake/alert Behavior During Therapy: [facility] for tasks assessed/performed;Flat affect                                   General Comments: attended to tasks, demonstrated some apraxia and difficulty bringing hands together.          Exercises General Exercises - Upper Extremity Shoulder Flexion: AAROM;Right;10 reps;Other (comment) Shoulder ABduction: Right;AAROM;10 reps Shoulder Horizontal ABduction: Right;AAROM;10 reps Elbow Flexion: AROM;Right;10 reps;Supine Wrist Flexion: AROM;Right;10 reps Digit Composite Flexion: AROM;Right;10 reps;Supine(also opposition to index finger) Composite Extension: AROM;Right;10 reps;Supine   Shoulder Instructions       General Comments      Pertinent Vitals/ Pain       Pain Assessment: Faces Faces Pain Scale: Hurts even more Pain Location: RUE with abduction  Pain Descriptors / Indicators: Grimacing Pain Intervention(s): Limited activity within patient's tolerance;Monitored during session  Home Living                                          Prior Functioning/Environment              Frequency  Min 2X/week        Progress Toward Goals  OT Goals(current goals can now be found in the care plan section)  Progress towards OT goals: Progressing toward goals     Plan      Co-evaluation                 AM-PAC OT "6 Clicks" Daily Activity     Outcome Measure   Help from another person eating meals?: A Little Help from another person taking care of personal grooming?: A Little Help from another person toileting, which includes using toliet, bedpan, or urinal?: Total Help from another person bathing (including washing, rinsing, drying)?: A Lot Help from another person to put on  and taking off regular upper body clothing?: A Lot Help from another person to put on and taking off regular lower body clothing?: Total 6 Click Score: 12    End of Session    OT Visit Diagnosis: Hemiplegia and hemiparesis;Other symptoms and signs involving cognitive function Symptoms and signs involving cognitive functions: Cerebral infarction Hemiplegia - Right/Left: Right Hemiplegia - dominant/non-dominant: Dominant Hemiplegia - caused by: Cerebral infarction   Activity Tolerance Patient tolerated treatment well   Patient Left in chair;with call bell/phone within reach   Nurse Communication          Time: 6378-5885 OT Time Calculation (min): 15 min  Charges: OT General Charges $OT Visit: 1 Visit OT Treatments $Self Care/Home Management : 8-22 mins  Marica Otter, OTR/L Acute Rehabilitation Services [phone] [facility] pager [phone] office 02/16/2019   Kerry Chisolm 02/16/2019, 4:26 PM

## 2019-02-16 NOTE — Progress Notes (Signed)
Physical Therapy Treatment Patient Details Name: Philip Vasquez MRN: [id number] DOB: 1960-09-17 Today's Date: 02/16/2019    History of Present Illness [name] is a 58 year old gentleman with no past medical history who presented with AMS and right-sided weakness. MRI: Widely scattered small acute infarcts in the medial lefthemisphere involving left ACA and left ACA/MCA watershed area.    PT Comments    Pt performed transfer training with motivation today. He was able to follow commands.  He was more apraxic with RUE and presented with increased tone in RLE.  Pt remains hospitalized awaiting LOG placement.  If he continues to participate he has potential to make functional gains.      Follow Up Recommendations  Supervision/Assistance - 24 hour;SNF     Equipment Recommendations  (defer)    Recommendations for Other Services       Precautions / Restrictions Precautions Precautions: Fall Precaution Comments: apneic, R UE/LE weakness Restrictions Weight Bearing Restrictions: No    Mobility  Bed Mobility Overal bed mobility: Needs Assistance Bed Mobility: Rolling;Sidelying to Sit Rolling: Mod assist Sidelying to sit: Mod assist       General bed mobility comments: Pt able to bring B LEs to edge of bed assistance to scoot hips to edge of bed and assistance to elevate trunk into sitting.  P with RLE extensor tone and decreased strength in R UE.  Transfers Overall transfer level: Needs assistance Equipment used: Ambulation equipment used(sara stedy.) Transfers: Sit to/from Stand Sit to Stand: Mod assist;+2 safety/equipment         General transfer comment: Once in standing able to maintain balance with min +2 and flexed posturing. Pt required cues for hand placement and assistance to boost into standing.  Ambulation/Gait Ambulation/Gait assistance: (NT)               Stairs             Wheelchair Mobility    Modified Rankin (Stroke Patients Only)        Balance Overall balance assessment: Needs assistance Sitting-balance support: Single extremity supported;Bilateral upper extremity supported;Feet supported Sitting balance-Leahy Scale: Poor Sitting balance - Comments: reliant on single UE support, supervision for safety Postural control: Right lateral lean   Standing balance-Leahy Scale: Poor Standing balance comment: Heavy R lateral lean with posterior bias.  Reliant on B UE support.                            Cognition Arousal/Alertness: Awake/alert Behavior During Therapy: WFL for tasks assessed/performed;Flat affect Overall Cognitive Status: Within Functional Limits for tasks assessed(not formally assessed but [facility] for tasks assessed.)                                 General Comments: demonstrated some tone on RLE.      Exercises General Exercises - Upper Extremity Shoulder Flexion: AAROM;Right;10 reps;Other (comment) Shoulder ABduction: Right;AAROM;10 reps Shoulder Horizontal ABduction: Right;AAROM;10 reps Elbow Flexion: AROM;Right;10 reps;Supine Wrist Flexion: AROM;Right;10 reps Digit Composite Flexion: AROM;Right;10 reps;Supine(also opposition to index finger) Composite Extension: AROM;Right;10 reps;Supine    General Comments        Pertinent Vitals/Pain Pain Assessment: Faces Pain Score: 9  Faces Pain Scale: Hurts even more Pain Location: RUE with abduction Pain Descriptors / Indicators: Grimacing Pain Intervention(s): Monitored during session;Repositioned    Home Living  Prior Function            PT Goals (current goals can now be found in the care plan section) Acute Rehab PT Goals Potential to Achieve Goals: Fair Progress towards PT goals: Progressing toward goals    Frequency    Min 1X/week      PT Plan Current plan remains appropriate    Co-evaluation              AM-PAC PT "6 Clicks" Mobility   Outcome Measure  Help  needed turning from your back to your side while in a flat bed without using bedrails?: A Lot Help needed moving from lying on your back to sitting on the side of a flat bed without using bedrails?: Total Help needed moving to and from a bed to a chair (including a wheelchair)?: Total Help needed standing up from a chair using your arms (e.g., wheelchair or bedside chair)?: Total Help needed to walk in hospital room?: Total Help needed climbing 3-5 steps with a railing? : Total 6 Click Score: 7    End of Session Equipment Utilized During Treatment: Gait belt Activity Tolerance: Patient tolerated treatment well Patient left: with call bell/phone within reach;in chair;with chair alarm set Nurse Communication: Mobility status PT Visit Diagnosis: Other abnormalities of gait and mobility (R26.89);Hemiplegia and hemiparesis;Muscle weakness (generalized) (M62.81) Hemiplegia - Right/Left: Right     Time: 0626-9485 PT Time Calculation (min) (ACUTE ONLY): 9 min  Charges:  $Therapeutic Activity: 8-22 mins                     Philip Vasquez , PTA Acute Rehabilitation Services Pager [phone] Office [phone]     Philip Vasquez 02/16/2019, 4:37 PM

## 2019-02-16 NOTE — Progress Notes (Signed)
   Subjective: Per nursing notes, patient's brother now banned from the premesis due to inappropriate behavior yesterday. See nursing note. When seen at bedside this morning, patient looked uncomfortable. Noted suprapubic tenderness.  Objective:  Vital signs in last 24 hours: Vitals:   02/15/19 1559 02/15/19 1923 02/15/19 2334 02/16/19 0348  BP: 133/73 127/71 (!) 156/89 128/82  Pulse: 68 94 98 75  Resp: 18 18 18 18   Temp: 99.5 F (37.5 C) 98.8 F (37.1 C) 98.6 F (37 C) 98.9 F (37.2 C)  TempSrc: Oral Oral Oral Oral  SpO2: 100% 99% 98% 97%  Weight:      Height:       Physical Exam: General: No acute distress Cardiac: Heart regular rate and rhythm Pulmonary: Lung sounds clear to auscultation Abdomen: suprapubic tenderness. Neuro: Alert and oriented Skin: No rash   Summary In summary Philip Vasquez is a 58 year old gentleman who uses cocaine daily with no recorded past medical history who presented on 12-19-18 with left ACA/MCA watershed infarcts. His current deficits are right-sided weakness which has significantly improved and dysarthria (also improved). He completed 3 weeks of dual antiplatelet therapy. Hospital course has been complicated by bleeding issues.  These include melena which is since resolved and hematuria which is ongoing.  Urinary retention has also been an ongoing issue.  This is required him to be catheterized for the vast majority of his hospitalization.  Urology has been consulted and recommended continued Foley with plans for outpatient follow-up. Hospital course has also been complicated by patient's depression which is understandably likely attributable to his stroke and subsequent deficits. Fluoxetine and Remeron have been started and patient has shown improvement. The patient is currently medically stable and has been optimized for discharge. However, his disposition is unknown because he is not a candidate for CIR or SNF placement due to lack of insurance. SW  and case management have been active on his case.   Assessment/Plan:  Principal Problem:   Cerebral thrombosis with cerebral infarction Active Problems:   Right sided weakness   Hypertension   Hyperlipidemia   Ulcer of penis   Urinary retention   Depression   Hematuria   Anemia of chronic disease   Acute blood loss anemia  L ACA/MCA Infarcts resulting in right sided weakness and dysarthria and developed situational related depression. Patient working with therapy a little more often. Plan Continue aspirin statin Continue Prozac and Remeron Continue PT and OT We will decrease baclofen to twice daily  Possible UTI. Patient indicated suprapubic pain. Has required continued catheterization for urinary retention. Failed numerous voiding trials. Plan: UA/UC. Will start 7d course of rocephin empirically (11/10-11/17).   Urinary Retention/Hematuria.   Urology recommending to continue foley and have him be seen outpatient post discharge. Plan.continue Foley and Flomax  Constipation.  Has been an intermittent issue throughout his hospitalization.  Likely multifactorial including poor diet, and immobility.  Plan: Continue MiraLAX twice daily, senna and as needed Dulcolax HTN: stable. continue losartan, metoprolol, amlodipine, hydralizine. Melena: Resolved  CODE STATUS: Full Pain: Tylenol as needed DVT prophylaxis: SCDs GI prophylaxis: Protonix 40 mg twice daily Disposition: Medicaid still pending. apps resent to SNFs in [place]. No offers yet. Case management and SW following.   Will need follow up with neuro in 6w post d/c. Will need podiatry appt at discharge. Will need GI appt for colonoscopy at discharge.  Mitzi Hansen, MD 02/16/19 5:14 AM

## 2019-02-17 MED ORDER — TAMSULOSIN HCL 0.4 MG PO CAPS
0.8000 mg | ORAL_CAPSULE | Freq: Every day | ORAL | Status: DC
  Administered 2019-02-18 – 2019-05-04 (×76): 0.8 mg via ORAL
  Filled 2019-02-17 (×77): qty 2

## 2019-02-17 NOTE — Progress Notes (Signed)
  Speech Language Pathology Treatment: Cognitive-Linquistic  Patient Details Name: Philip Vasquez MRN: [id number] DOB: 19-Dec-1960 Today's Date: 02/17/2019 Time: 3545-6256 SLP Time Calculation (min) (ACUTE ONLY): 15 min  Assessment / Plan / Recommendation Clinical Impression  Skilled treatment session focused on cognition goals. SLP recieved pt upright in bed, alert and cooperative. Despite being cooperative, pt primarily responded with "nope" to all questions. It appeared that he enjoyed "jokingly" answering as he smiled during answers and his head nods contradicted his verbal "nope" With Mod A cues, pt able to initially answer yes/no questions accurately and was able to demonstrate knowledge of orientation information. But as session progressed, pt's "nope" responses grew and session became less functional. Pt left upright in bed with all needs within reach. This Probation officer spoke with pt's nurse. Nurse states that pt was talkative this morning with her.    HPI HPI: Pt is a 58 year old gentleman with no past medical history who presented with AMS and right-sided weakness. MRI: Widely scattered small acute infarcts in the medial lefthemisphere involving left ACA and left ACA/MCA watershed area. UA positive for cocaine use.       SLP Plan  Continue with current plan of care       Recommendations   SNF                Oral Care Recommendations: Oral care BID Follow up Recommendations: 24 hour supervision/assistance;Inpatient Rehab SLP Visit Diagnosis: Cognitive communication deficit (L89.373) Plan: Continue with current plan of care       [place] 02/17/2019, 1:07 PM

## 2019-02-17 NOTE — Progress Notes (Signed)
Nutrition Follow-up  RD working remotely.   DOCUMENTATION CODES:   Obesity unspecified  INTERVENTION:  - continue Ensure Enlive BID and 30 ml Prostat BID. - continue to encourage PO intakes.    NUTRITION DIAGNOSIS:   Inadequate oral intake related to lethargy/confusion as evidenced by estimated needs(reported meal completion). -resolving  GOAL:   Patient will meet greater than or equal to 90% of their needs -met  MONITOR:   PO intake, Supplement acceptance, Skin, Weight trends, Labs, I & O's  ASSESSMENT:   58 year old man with hyperlipidemia, hypertension, and cocaine use disorder admitted with right upper and lower extremity weakness admitted with scattered acute infarcts in the medial left ACA and MCA watershed distribution.  Weight has been fairly stable over the past 1 month. Per flow sheet documentation, he consumed 50% of breakfast and dinner on 11/7; 80% of breakfast, 75% of lunch, and 80% of dinner on 11/8; 90% of breakfast and 50% of lunch on 11/9.   Per review of orders, patient has been accepting Ensure ~50% of the time offered and prostat 100% of the time offered.   Medicaid is still pending. Applications were re-sent to [facility] and no facility has accepted patient yet. Plan for Neuro f/u 6 weeks after d/c and for colonoscopy as an outpatient.    Labs reviewed; no BMP since 10/29. Medications reviewed; 1 mg folvite/day, 7.5 mg remeron/night, daily multivitamin with minerals, 1 packet miralax BID, 2 tablets senokot/day, 100 mg oral thiamine/day.     NUTRITION - FOCUSED PHYSICAL EXAM:  unable to complete at this time.   Diet Order:   Diet Order            Diet regular Room service appropriate? Yes with Assist; Fluid consistency: Thin  Diet effective now              EDUCATION NEEDS:   Not appropriate for education at this time  Skin:  Skin Assessment: Skin Integrity Issues: Skin Integrity Issues:: Other (Comment) Other: N/A  Last BM:   11/10  Height:   Ht Readings from Last 1 Encounters:  12/19/18 _0  (1.753 m)    Weight:   Wt Readings from Last 1 Encounters:  02/15/19 125 kg    Ideal Body Weight:  72.7 kg  BMI:  Body mass index is 40.7 kg/m.  Estimated Nutritional Needs:   Kcal:  2000-2200  Protein:  100-115 grams  Fluid:  >/= 2 L/day      Jarome Matin, MS, RD, LDN, [facility] Inpatient Clinical Dietitian Pager # [phone] After hours/weekend pager # [phone]

## 2019-02-17 NOTE — Progress Notes (Signed)
Occupational Therapy Treatment Patient Details Name: Philip Vasquez MRN: [id number] DOB: January 05, 1961 Today's Date: 02/17/2019    History of present illness Philip Vasquez is a 58 year old gentleman with no past medical history who presented with AMS and right-sided weakness. MRI: Widely scattered small acute infarcts in the medial lefthemisphere involving left ACA and left ACA/MCA watershed area.   OT comments  Pt more verbal today, participated well with grooming, reaching with RUE, RUE exercises and rolling.  He did not want to sit up at EOB.  Encouraged bil UE use; pt needs cues to actively release his grip with RUE  Follow Up Recommendations  SNF;Supervision/Assistance - 24 hour    Equipment Recommendations  Wheelchair (measurements OT);Wheelchair cushion (measurements OT);3 in 1 bedside commode(? mechanical lift)    Recommendations for Other Services      Precautions / Restrictions Precautions Precautions: Fall Precaution Comments: apneic, R UE/LE weakness Restrictions Weight Bearing Restrictions: No       Mobility Bed Mobility     Rolling: Mod assist(to L; max A to R and painful to R)         General bed mobility comments: pt declined sitting EOB this pm  Transfers                      Balance                                           ADL either performed or assessed with clinical judgement   ADL       Grooming: Minimal assistance                                 General ADL Comments: reached for and retrieved comb, deodorant and toothpaste with RUE. Used comb with LUE with cues to turn head to reach back of head.  Opened other containers using R as assist. Pt needed cues to release grip actively as he tended to pull object out of tight grip with LUE     Vision       Perception     Praxis      Cognition Arousal/Alertness: Awake/alert Behavior During Therapy: WFL for tasks assessed/performed;Flat affect                        Current Attention Level: Selective   Following Commands: Follows one step commands consistently       General Comments: less apraxic today.  Much more verbal:  told me his favorite team was the [employer].  Thanked me at the end of the session        Exercises General Exercises - Upper Extremity Shoulder Flexion: AROM;10 reps;Right Elbow Flexion: AROM;Right;10 reps;Supine Wrist Flexion: AROM;Right;10 reps(limited to neutral) Digit Composite Flexion: AROM;Right;10 reps;Supine Composite Extension: AROM;Right;10 reps;Supine   Shoulder Instructions       General Comments      Pertinent Vitals/ Pain       Pain Assessment: Faces Faces Pain Scale: Hurts whole lot Pain Location: RUE when weight bearing encouraged Pain Descriptors / Indicators: Grimacing Pain Intervention(s): Limited activity within patient's tolerance;Monitored during session;Repositioned  Home Living  Prior Functioning/Environment              Frequency  Min 2X/week        Progress Toward Goals  OT Goals(current goals can now be found in the care plan section)  Progress towards OT goals: Progressing toward goals     Plan      Co-evaluation                 AM-PAC OT "6 Clicks" Daily Activity     Outcome Measure   Help from another person eating meals?: A Little Help from another person taking care of personal grooming?: A Little Help from another person toileting, which includes using toliet, bedpan, or urinal?: Total Help from another person bathing (including washing, rinsing, drying)?: A Lot Help from another person to put on and taking off regular upper body clothing?: A Lot Help from another person to put on and taking off regular lower body clothing?: Total 6 Click Score: 12    End of Session    OT Visit Diagnosis: Hemiplegia and hemiparesis;Other symptoms and signs involving cognitive  function Symptoms and signs involving cognitive functions: Cerebral infarction Hemiplegia - Right/Left: Right Hemiplegia - dominant/non-dominant: Dominant Hemiplegia - caused by: Cerebral infarction   Activity Tolerance Patient tolerated treatment well   Patient Left in bed;with call bell/phone within reach;with bed alarm set;with restraints reapplied   Nurse Communication          Time: 4098-1191 OT Time Calculation (min): 20 min  Charges: OT General Charges $OT Visit: 1 Visit OT Treatments $Therapeutic Activity: 8-22 mins  Marica Otter, OTR/L Acute Rehabilitation Services [phone] [facility] pager [phone] office 02/17/2019   Philip Vasquez 02/17/2019, 4:15 PM

## 2019-02-17 NOTE — Progress Notes (Signed)
   Subjective: rocephin started yesterday for UTI. [name] notes a little improvement in suprapubic pain.  Also, he notes some issues with urination prior to hospitalization including incomplete bladder emptying and having to strain.  Objective:  Vital signs in last 24 hours: Vitals:   02/16/19 1618 02/16/19 1956 02/16/19 2344 02/17/19 0353  BP: 110/73 135/85 118/79 (!) 143/90  Pulse: 81 77 75 72  Resp: 16 17 18 17   Temp: 99.4 F (37.4 C) 98.9 F (37.2 C) 97.9 F (36.6 C) 98 F (36.7 C)  TempSrc: Oral Oral Oral Oral  SpO2: 98% 95% 93% 99%  Weight:      Height:       Physical Exam: General: No acute distress Cardiac: Regular rate and rhythm Pulmonary: Lung sounds clear to auscultation Abdomen: Soft.  Suprapubic tenderness present. Neuro: Cranial nerves II through XII grossly intact Skin: No rash   Summary In summary Philip Vasquez is a 58 year old gentleman who uses cocaine daily with no recorded past medical history who presented on 12-19-18 with left ACA/MCA watershed infarcts. His current deficits are right-sided weakness which has significantly improved and dysarthria (also improved). He completed 3 weeks of dual antiplatelet therapy. Hospital course has been complicated by bleeding issues.  These include melena which is since resolved and hematuria which is ongoing.  Urinary retention has also been an ongoing issue.  This is required him to be catheterized for the vast majority of his hospitalization.  Urology has been consulted and recommended continued Foley with plans for outpatient follow-up. Hospital course has also been complicated by patient's depression which is understandably likely attributable to his stroke and subsequent deficits. Fluoxetine and Remeron have been started and patient has shown improvement. The patient is currently medically stable and has been optimized for discharge. However, his disposition is unknown because he is not a candidate for CIR or SNF placement  due to lack of insurance. SW and case management have been active on his case.   Assessment/Plan:  Principal Problem:   Cerebral thrombosis with cerebral infarction Active Problems:   Right sided weakness   Hypertension   Hyperlipidemia   Ulcer of penis   Urinary retention   Depression   Hematuria   Anemia of chronic disease   Acute blood loss anemia  L ACA/MCA Infarcts resulting in right sided weakness and dysarthria and developed situational related depression.  Plan Continue aspirin statin Continue Prozac and Remeron Continue PT and OT baclofen to twice daily  Urinary tract infection. UA + nitrite, leukocytes, bacteria. UC pending. afebrile Plan: 7d course of rocephin (11/10-11/17).   Urinary Retention/Hematuria.   Urology recommending to continue foley and have him be seen outpatient post discharge. Plan.continue Foley and Flomax Constipation.  Has been an intermittent issue throughout his hospitalization.  Likely multifactorial including poor diet, and immobility.  Plan: Continue MiraLAX twice daily, senna and as needed Dulcolax HTN: stable. continue losartan, metoprolol, amlodipine, hydralizine. Melena: Resolved  CODE STATUS: Full Pain: Tylenol as needed DVT prophylaxis: SCDs GI prophylaxis: Protonix 40 mg twice daily Disposition: Medicaid still pending. apps resent to SNFs in [place]. No offers yet. Case management and SW following.   Will need follow up with neuro in 6w post d/c. Will need podiatry appt at discharge. Will need GI appt for colonoscopy at discharge.  Mitzi Hansen, MD 02/17/19 5:42 AM

## 2019-02-18 LAB — URINE CULTURE: Culture: >=100000 — AB

## 2019-02-18 NOTE — Progress Notes (Signed)
   Subjective: working with therapy!  Suprapubic pain improved. UC came back + for citrobacter youngae--sensitive to rocephin.  Objective:  Vital signs in last 24 hours: Vitals:   02/17/19 1459 02/17/19 1941 02/17/19 2332 02/18/19 0333  BP: 135/70 130/67 139/68 134/69  Pulse: 74 77 81 88  Resp: 16 18 18 18   Temp: 98.7 F (37.1 C) 98.5 F (36.9 C) 98.3 F (36.8 C) 98 F (36.7 C)  TempSrc: Oral Oral Oral Oral  SpO2: 97% 98% 99% 99%  Weight:      Height:       Physical Exam: General: No acute distress Cardiac: Regular rate and rhythm Pulmonary: Lung sounds clear to auscultation Abdomen: Soft.  Mild tenderness to palpation suprapubic region. Neuro: Alert and oriented Skin: No rash   Summary In summary Mr. Tavano is a 58 year old gentleman who uses cocaine daily with no recorded past medical history who presented on 12-19-18 with left ACA/MCA watershed infarcts. His current deficits are right-sided weakness which has significantly improved and dysarthria (also improved). He completed 3 weeks of dual antiplatelet therapy. Hospital course has been complicated by bleeding issues.  These include melena which is since resolved and hematuria which is ongoing.  Urinary retention has also been an ongoing issue.  This is required him to be catheterized for the vast majority of his hospitalization.  Urology has been consulted and recommended continued Foley with plans for outpatient follow-up. Hospital course has also been complicated by patient's depression which is understandably likely attributable to his stroke and subsequent deficits. Fluoxetine and Remeron have been started and patient has shown improvement. The patient is currently medically stable and has been optimized for discharge. However, his disposition is unknown because he is not a candidate for CIR or SNF placement due to lack of insurance. SW and case management have been active on his case.   Assessment/Plan:  Principal  Problem:   Cerebral thrombosis with cerebral infarction Active Problems:   Right sided weakness   Hypertension   Hyperlipidemia   Ulcer of penis   Urinary retention   Depression   Hematuria   Anemia of chronic disease   Acute blood loss anemia  L ACA/MCA Infarcts resulting in right sided weakness and dysarthria and developed situational related depression.  Plan Continue aspirin statin Continue Prozac and Remeron Continue PT and OT baclofen to twice daily   Urinary Retention/Hematuria.  Urinary incontinence x4 since foley removed yesterday. Urinary tract infection. UA + nitrite, leukocytes, bacteria. UC positive for Citrobacter youngae sensitive to rocephin.  Afebrile.  Foley catheter removed 11/11.  Plan: 7d course of rocephin (11/10-11/17).  Bladder scans every 4 hours.  Replace Foley for bladder scan greater than 600.  Flomax increased to 0.8 mg.  Constipation.  Has been an intermittent issue throughout his hospitalization.  Likely multifactorial including poor diet, and immobility.  Plan: Continue MiraLAX twice daily, senna and as needed Dulcolax HTN: stable. continue losartan, metoprolol, amlodipine, hydralizine. Melena: Resolved  CODE STATUS: Full Pain: Tylenol as needed DVT prophylaxis: SCDs GI prophylaxis: Protonix 40 mg twice daily Disposition: Medicaid still pending. apps resent to SNFs in [place]. No offers yet. Case management and SW following.   Will need follow up with neuro in 6w post d/c. Will need podiatry appt at discharge. Will need GI appt for colonoscopy at discharge.  Mitzi Hansen, MD 02/18/19 5:47 AM

## 2019-02-19 NOTE — Progress Notes (Signed)
   Subjective: no overnight events  Objective:  Vital signs in last 24 hours: Vitals:   02/18/19 1237 02/18/19 1713 02/18/19 2036 02/19/19 0010  BP: 136/80 (!) 148/94 (!) 150/86 135/90  Pulse: 69 80 88 66  Resp: 16 17 17 17   Temp: 98.4 F (36.9 C) 98.1 F (36.7 C) 98.1 F (36.7 C) 98.1 F (36.7 C)  TempSrc: Oral Oral Oral Oral  SpO2: 99% 95% 93% 99%  Weight:      Height:       Physical Exam: General: No acute distress Cardiac: Heart regular rate and rhythm Pulmonary: Lungs clear Abdomen: Soft nontender Neuro: Alert and oriented Skin: No rash  Summary In summary [name] is a 58 year old gentleman who uses cocaine daily with no recorded past medical history who presented on 12-19-18 with left ACA/MCA watershed infarcts. His current deficits are right-sided weakness which has significantly improved and dysarthria (also improved). He completed 3 weeks of dual antiplatelet therapy. Hospital course has been complicated by bleeding issues.  These include melena which is since resolved and hematuria which is ongoing.  Urinary retention has also been an ongoing issue requiring him to be catheterized for a large proportion of his hospitalization. Urology had been consulted and recommended continued Foley with plans for outpatient follow-up however catheter had to be removed due to a urinary tract infection.  Patient began to have improved output without repeat Foley Hospital course has also been complicated by patient's depression related to  stroke and subsequent deficits. Fluoxetine and Remeron have been started and patient has shown improvement. The patient is currently medically stable and has been optimized for discharge. However, his disposition is unknown because he is not a candidate for CIR or SNF placement due to lack of insurance. SW and case management have been active on his case.   Assessment/Plan:  Principal Problem:   Cerebral thrombosis with cerebral infarction Active  Problems:   Right sided weakness   Hypertension   Hyperlipidemia   Ulcer of penis   Urinary retention   Depression   Hematuria   Anemia of chronic disease   Acute blood loss anemia  L ACA/MCA Infarcts resulting in right sided weakness and dysarthria and developed situational related depression.  Plan Continue aspirin statin Continue Prozac and Remeron Continue PT, OT, ST Continue baclofen  Urinary Retention/Hematuria.  Continuing to have good UOP without catheter. Urinary tract infection. UC positive for Citrobacter youngae sensitive to rocephin.  Afebrile.  Foley catheter removed 11/11.  Plan: 7d course of rocephin (11/10-11/17). Continue Flomax at increased dose. Prn bladder scans if no void during shift. Notify primary team for scans >657mL  Constipation. Improved. Has been an intermittent issue throughout his hospitalization.  Likely multifactorial including poor diet, and immobility.  Plan: Continue MiraLAX twice daily, senna and as needed Dulcolax HTN: stable. continue losartan, metoprolol, amlodipine, hydralizine. Melena: Resolved  CODE STATUS: Full Pain: Tylenol as needed DVT prophylaxis: SCDs GI prophylaxis: Protonix 40 mg twice daily Disposition: Medicaid still pending. apps resent to SNFs in [place]. No offers yet. Case management and SW following.   Will need follow up with neuro in 6w post d/c. Will need podiatry appt at discharge. Will need GI appt for colonoscopy at discharge.  Mitzi Hansen, MD 02/19/19 5:01 AM

## 2019-02-19 NOTE — Progress Notes (Signed)
SLP Cancellation Note  Patient Details Name: Philip Vasquez MRN: [id number] DOB: 1960-04-09   Cancelled treatment:       Unable to see pt for cognitive treatment at this time, due to unavailability (with nursing for toileting needs). Will continue efforts.   Carmela Rima, [place] Speech Language Pathologist Office: [phone] Pager: [phone]  Shonna Chock 02/19/2019, 2:40 PM

## 2019-02-20 DIAGNOSIS — T83518A Infection and inflammatory reaction due to other urinary catheter, initial encounter: Secondary | ICD-10-CM

## 2019-02-20 DIAGNOSIS — B9689 Other specified bacterial agents as the cause of diseases classified elsewhere: Secondary | ICD-10-CM

## 2019-02-20 NOTE — Progress Notes (Signed)
   Subjective: No acute events overnight. Still voiding on his own. Suprapubic pain has resolved.   Objective:  Vital signs in last 24 hours: Vitals:   02/19/19 2110 02/20/19 0002 02/20/19 0009 02/20/19 0417  BP: (!) 157/86 128/80  (!) 154/103  Pulse: 88 (!) 135 83 72  Resp: 17 17  17   Temp: 98.3 F (36.8 C) 98.3 F (36.8 C)  97.8 F (36.6 C)  TempSrc: Oral Oral  Oral  SpO2: 96% 96%  96%  Weight:      Height:       General: awake, alert, lying comfortably in bed in NAD CV: RRR Pulmonary: normal work of breathing; lungs CTAB  Abd: soft, non-tender   Assessment/Plan:  Principal Problem:   Cerebral thrombosis with cerebral infarction Active Problems:   Right sided weakness   Hypertension   Hyperlipidemia   Ulcer of penis   Urinary retention   Depression   Hematuria   Anemia of chronic disease   Acute blood loss anemia  Summary In summary Philip Vasquez is a 58 year old gentleman who uses cocaine daily with no recorded past medical history who presented on 12-19-18 with left ACA/MCA watershed infarcts. His current deficits are right-sided weakness which has significantly improved and dysarthria (also improved). He completed 3 weeks of dual antiplatelet therapy. Hospital course has been complicated by bleeding issues.  These include melena which is since resolved and hematuria which is ongoing.  Urinary retention has also been an ongoing issue requiring him to be catheterized for a large proportion of his hospitalization. Urology had been consulted and recommended continued Foley with plans for outpatient follow-up however catheter had to be removed due to a urinary tract infection.  Patient began to have improved output without repeat Foley Hospital course has also been complicated by patient's depression related to  stroke and subsequent deficits. Fluoxetine and Remeron have been started and patient has shown improvement. The patient is currently medically stable and has been  optimized for discharge. However, his disposition is unknown because he is not a candidate for CIR or SNF placement due to lack of insurance. SW and case management have been active on his case.   Assessment/Plan:  Principal Problem:   Cerebral thrombosis with cerebral infarction Active Problems:   Right sided weakness   Hypertension   Hyperlipidemia   Ulcer of penis   Urinary retention   Depression   Hematuria   Anemia of chronic disease   Acute blood loss anemia  L ACA/MCA Infarcts resulting in right sided weakness and dysarthria and developed situational related depression.  Plan Continue aspirin statin Continue Prozac and Remeron Continue PT, OT, ST Continue baclofen  Urinary Retention/Hematuria.   Catheter Associated Urinary tract infection Continuing to have good UOP without catheter. UC positive for Citrobacter youngae sensitive to rocephin.  Afebrile.  Foley catheter removed 11/11. Continue 7 day course of Rocephin (stop date 11/17). Continue periodic bladder scans.   Constipation Improved. Has been an intermittent issue throughout his hospitalization.  Likely multifactorial including poor diet, and immobility.  Continue MiraLAX twice daily, senna and as needed Dulcolax  HTN: stable continue losartan, metoprolol, amlodipine, hydralizine.  Melena: Resolved   Dispo: Medicaid still pending. Applications resent to SNFs in [place]. No offers yet. Case management and SW following. Appreciate their assistance.   Modena Nunnery D, DO 02/20/2019, 6:54 AM Pager: [phone]

## 2019-02-21 LAB — CBC
HCT: 30.8 % — ABNORMAL LOW (ref 39.0–52.0)
Hemoglobin: 9.5 g/dL — ABNORMAL LOW (ref 13.0–17.0)
MCH: 28.4 pg (ref 26.0–34.0)
MCHC: 30.8 g/dL (ref 30.0–36.0)
MCV: 92.2 fL (ref 80.0–100.0)
Platelets: 255 10*3/uL (ref 150–400)
RBC: 3.34 MIL/uL — ABNORMAL LOW (ref 4.22–5.81)
RDW: 14 % (ref 11.5–15.5)
WBC: 6.5 10*3/uL (ref 4.0–10.5)
nRBC: 0 % (ref 0.0–0.2)

## 2019-02-21 NOTE — TOC Progression Note (Signed)
Transition of Care [facility]) - Progression Note    Patient Details  Name: Philip Vasquez MRN: [id number] Date of Birth: 01-24-1961  Transition [facility]) CM/SW [place], [place] Phone Number: 02/21/2019, 9:36 AM  Clinical Narrative:     No current bed offers. CSW will continue to assist with placement.   Expected Discharge Plan: IP Rehab Facility Barriers to Discharge: SNF Pending payor source - LOG, SNF Pending bed offer, Inadequate or no insurance, Active Substance Use - Placement  Expected Discharge Plan and Services Expected Discharge Plan: [place] In-house Referral: Clinical Social Work Discharge Planning Services: CM Consult                                           Social Determinants of Health (SDOH) Interventions    Readmission Risk Interventions No flowsheet data found.

## 2019-02-21 NOTE — Progress Notes (Signed)
Subjective: no overnight events. Let patient know that I would be turning his care over to my colleague, Dr. Darl Pikes, after today. Patient verbalized that he would appreciate if I could continue to see him after I return in 2w. I discussed with him that Dr. Darl Pikes would provide great care for him however I will see what I can do to continue to follow with him after I return. No other overnight events.  Objective:  Vital signs in last 24 hours: Vitals:   02/20/19 1636 02/20/19 1943 02/20/19 2320 02/21/19 0352  BP: 135/85 (!) 149/89 (!) 141/96 (!) 134/96  Pulse: 74 81 76 70  Resp: 18 16 17 16   Temp: 99.2 F (37.3 C) 99.2 F (37.3 C) 98.3 F (36.8 C) 97.9 F (36.6 C)  TempSrc: Oral Oral Oral Oral  SpO2: 99% 100% 95% 94%  Weight:      Height:       Physical Exam: General: in no acute distress Cardiac: RRR Pulm: lungs clear Abd: soft, nontender GU: condom cath in place Skin: no rash  Summary In summary Mr. Boruff is a 58 year old gentleman who uses cocaine daily with no recorded past medical history who presented on 12-19-18 with left ACA/MCA watershed infarcts. His current deficits are right-sided weakness which has significantly improved and dysarthria (also improved). He completed 3 weeks of dual antiplatelet therapy. Hospital course has been complicated by bleeding issues.  These include melena which is since resolved and hematuria which is ongoing.  Urinary retention has also been an ongoing issue requiring him to be catheterized for a large proportion of his hospitalization. Urology had been consulted and recommended continued Foley with plans for outpatient follow-up however catheter had to be removed due to a urinary tract infection.  Patient began to have improved output without repeat Foley Hospital course has also been complicated by patient's depression related to  stroke and subsequent deficits. Fluoxetine and Remeron have been started and patient has shown improvement. The  patient is currently medically stable and has been optimized for discharge. However, his disposition is unknown because he is not a candidate for CIR or SNF placement due to lack of insurance. SW and case management have been active on his case.   Assessment/Plan:  Principal Problem:   Cerebral thrombosis with cerebral infarction Active Problems:   Right sided weakness   Hypertension   Hyperlipidemia   Ulcer of penis   Urinary retention   Depression   Hematuria   Anemia of chronic disease   Acute blood loss anemia  L ACA/MCA Infarcts resulting in right sided weakness and dysarthria and developed situational related depression.  Plan Asprin and statin Prozac and Remeron PT/OT/ST Baclofen bid  Urinary Retention/Hematuria.  Condom cath placed due to concern for skin breakdown from incontinence. Continues to have adequate UOP. CAUTI. UC positive for Citrobacter youngae sensitive to rocephin.  Afebrile.  Foley catheter removed 11/11.  Plan: 7d course of rocephin (11/10-11/17). Continue Flomax at increased dose. Prn bladder scans if no void during shift. Notify primary team for scans >651mL  Constipation. Improved. Has been an intermittent issue throughout his hospitalization.  Likely multifactorial including poor diet, and immobility.  Plan: Continue MiraLAX twice daily, senna and as needed Dulcolax. Ok to hold if having mult loose stools through the day HTN: stable. continue losartan, metoprolol, amlodipine, hydralizine. Melena: Resolved  CODE STATUS: Full Pain: Tylenol as needed DVT prophylaxis: SCDs GI prophylaxis: Protonix 40 mg twice daily Disposition: Medicaid still pending. apps resent to SNFs  in [place]. No offers yet. Case management and SW following.   Will need follow up with neuro in 6w post d/c. Will need podiatry appt at discharge. Will need GI appt for colonoscopy at discharge.  Mitzi Hansen, MD 02/21/19 4:58 AM  Dr. Al Decant (IM resident PGY-1) to  resume care in AM on 02/22/19.

## 2019-02-22 DIAGNOSIS — Z7982 Long term (current) use of aspirin: Secondary | ICD-10-CM

## 2019-02-22 NOTE — Progress Notes (Addendum)
   Subjective: Philip Vasquez has no complaints today.  He denies chest pain, shortness of breath, abdominal pain.  He reports he is urinating without blood.  He has no other complaints or concerns.  Consults: na  Objective:  Vital signs in last 24 hours: Vitals:   02/21/19 1941 02/21/19 2334 02/22/19 0402 02/22/19 0800  BP: (!) 159/95 (!) 142/93 (!) 128/95 (!) 146/90  Pulse: 83 73 72 78  Resp: 16 16 17 18   Temp: 99.2 F (37.3 C) 98.1 F (36.7 C) 98.2 F (36.8 C) 98.4 F (36.9 C)  TempSrc: Oral Oral Oral Axillary  SpO2: 94% 96% 97% 100%  Weight:      Height:        Physical Exam  Constitutional: No distress.  Cardiovascular: Normal rate, regular rhythm, normal heart sounds and intact distal pulses.  No murmur heard. Pulmonary/Chest: Effort normal and breath sounds normal. No respiratory distress. He exhibits no tenderness.  Abdominal: Soft. Bowel sounds are normal. He exhibits no distension. There is no abdominal tenderness.  Musculoskeletal: Normal range of motion.  Neurological: He is alert.  Skin: Skin is warm and dry. He is not diaphoretic. No erythema.  Psychiatric: Affect normal.  Nursing note and vitals reviewed.  I/Os:  Intake/Output Summary (Last 24 hours) at 02/22/2019 1123 Last data filed at 02/22/2019 1113 Gross per 24 hour  Intake 720 ml  Output 1075 ml  Net -355 ml    Telemetry:  Labs: CBC Latest Ref Rng & Units 02/21/2019 02/08/2019 02/04/2019  WBC 4.0 - 10.5 K/uL 6.5 7.4 8.0  Hemoglobin 13.0 - 17.0 g/dL 9.5(L) 8.8(L) 8.5(L)  Hematocrit 39.0 - 52.0 % 30.8(L) 27.9(L) 26.8(L)  Platelets 150 - 400 K/uL 255 294 330    Assessment/Plan:  Assessment: Philip Vasquez is a 58 year old male with daily cocaine use and no previously recorded medical history who presented with altered mental status and right-sided weakness, found to have left ACA/MCA watershed infarcts now status post 3 weeks of dual antiplatelet therapy whose stay has been complicated by bleeding,  including urinary and GI bleeding. Urology consulted and recommends continued Foley catheter.  Other issues include depression for which we have started fluoxetine and Remeron with improvement.  Patient medically stable for discharge however disposition difficult given insurance status. Social work and case management are working to obtain appropriate posthospitalization disposition.  Plan: Principal Problem:   Cerebral thrombosis with cerebral infarction   Right sided weakness   Hypertension   Hyperlipidemia  -Status post 3 weeks of dual antiplatelet therapy -Continue aspirin and statin -Baclofen twice daily -Continue PT/OT/ST   Active Problems:   Urinary retention -flomax 0.8 daily  -CAUTI treatment with rocephin through 11/17    Hematuria -No hematuria noted today    Anemia of chronic disease/Acute blood loss anemia -CBC yesterday with stable/increased hemoglobin of 9.5 up from 8.8    Depression -Continue Prozac and Remeron  Dispo: Anticipated discharge pending Medicaid approval and SNF placement.  Al Decant, MD 02/22/2019, 11:23 AM Pager: [phone]

## 2019-02-22 NOTE — Progress Notes (Signed)
Occupational Therapy Treatment Patient Details Name: Philip Vasquez MRN: [id number] DOB: 04-Oct-1960 Today's Date: 02/22/2019    History of present illness [name] is a 58 year old gentleman with no past medical history who presented with AMS and right-sided weakness. MRI: Widely scattered small acute infarcts in the medial lefthemisphere involving left ACA and left ACA/MCA watershed area.   OT comments  Pt received in bed, agreeable to bed level ADL this session. Pt requires minA to don lotion, wash face, and don gown. Pt required minA+2 for rolling in bed to complete posterior pericare. Pt will continue to benefit from skilled OT services to maximize safety and independence with ADL/IADL and functional mobility. Will continue to follow acutely and progress as tolerated.    Follow Up Recommendations  SNF;Supervision/Assistance - 24 hour    Equipment Recommendations  Wheelchair (measurements OT);Wheelchair cushion (measurements OT);3 in 1 bedside commode    Recommendations for Other Services Rehab consult    Precautions / Restrictions Precautions Precautions: Fall Precaution Comments: apneic, R UE/LE weakness Restrictions Weight Bearing Restrictions: No       Mobility Bed Mobility Overal bed mobility: Needs Assistance Bed Mobility: Rolling Rolling: Min assist         General bed mobility comments: rolled R<>L, declined OOB mobility this session  Transfers                 General transfer comment: pt declined this session    Balance Overall balance assessment: Needs assistance Sitting-balance support: No upper extremity supported Sitting balance-Leahy Scale: Poor                                     ADL either performed or assessed with clinical judgement   ADL Overall ADL's : Needs assistance/impaired     Grooming: Minimal assistance;Wash/dry face Grooming Details (indicate cue type and reason): minA to use lotion and apply to LUE          Upper Body Dressing : Minimal assistance Upper Body Dressing Details (indicate cue type and reason): minA to don gown     Toilet Transfer: Minimal assistance;+2 for physical assistance;With caregiver independent assisting;Moderate assistance Toilet Transfer Details (indicate cue type and reason): rolled side<>side for posterior pericare Toileting- Clothing Manipulation and Hygiene: Total assistance Toileting - Clothing Manipulation Details (indicate cue type and reason): condom catheter on, totalA for posterior pericare       General ADL Comments: completed ADL bed level, pt declined OOB mobility this session     Vision   Vision Assessment?: Vision impaired- to be further tested in functional context Additional Comments: pt required vc to turn head to compensate for visual limiations;will continue to further assess functionally   Perception     Praxis      Cognition Arousal/Alertness: Awake/alert Behavior During Therapy: WFL for tasks assessed/performed;Flat affect Overall Cognitive Status: Impaired/Different from baseline Area of Impairment: Following commands;Attention;Problem solving                   Current Attention Level: Selective   Following Commands: Follows one step commands consistently     Problem Solving: Difficulty sequencing;Requires verbal cues;Requires tactile cues;Slow processing General Comments: LUE wandering at times, pt required increased effort during bimanual tasks, requires vc to visually attend to BUE during functioanl task, required multimodal cue to process squeezing lotion from bottle        Exercises     Shoulder Instructions  General Comments VSS throughout session    Pertinent Vitals/ Pain       Pain Assessment: Faces Faces Pain Scale: Hurts even more Pain Location: RLE with movement Pain Descriptors / Indicators: Grimacing Pain Intervention(s): Limited activity within patient's tolerance;Monitored during  session;Repositioned  Home Living                                          Prior Functioning/Environment              Frequency  Min 2X/week        Progress Toward Goals  OT Goals(current goals can now be found in the care plan section)  Progress towards OT goals: Progressing toward goals  Acute Rehab OT Goals Patient Stated Goal: none stated OT Goal Formulation: With patient Time For Goal Achievement: 02/27/19 Potential to Achieve Goals: Fair ADL Goals Pt Will Perform Grooming: with set-up;sitting Pt Will Perform Upper Body Bathing: with set-up;sitting Pt Will Transfer to Toilet: stand pivot transfer;bedside commode;with mod assist Pt/caregiver will Perform Home Exercise Program: Increased ROM;Right Upper extremity;With written HEP provided Additional ADL Goal #1: Pt will be able to roll left and right with min A to help with basic ADLs Additional ADL Goal #2: Pt will be able to come up to sit EOB with min A in prep for transfers and EOB ADLs Additional ADL Goal #3: Pt will scan environment for appropriate items for functional tasks with minimal cueing.  Plan Discharge plan remains appropriate    Co-evaluation    PT/OT/SLP Co-Evaluation/Treatment: Yes Reason for Co-Treatment: Complexity of the patient's impairments (multi-system involvement);For patient/therapist safety;To address functional/ADL transfers   OT goals addressed during session: ADL's and self-care      AM-PAC OT "6 Clicks" Daily Activity     Outcome Measure   Help from another person eating meals?: A Little Help from another person taking care of personal grooming?: A Little Help from another person toileting, which includes using toliet, bedpan, or urinal?: Total Help from another person bathing (including washing, rinsing, drying)?: A Lot Help from another person to put on and taking off regular upper body clothing?: A Little Help from another person to put on and taking off  regular lower body clothing?: Total 6 Click Score: 13    End of Session    OT Visit Diagnosis: Hemiplegia and hemiparesis;Other symptoms and signs involving cognitive function Symptoms and signs involving cognitive functions: Cerebral infarction Hemiplegia - Right/Left: Right Hemiplegia - dominant/non-dominant: Dominant Hemiplegia - caused by: Cerebral infarction   Activity Tolerance Patient tolerated treatment well   Patient Left in bed;with call bell/phone within reach;with bed alarm set;with restraints reapplied   Nurse Communication Mobility status        Time: 1040-1104 OT Time Calculation (min): 24 min  Charges: OT General Charges $OT Visit: 1 Visit OT Treatments $Self Care/Home Management : 8-22 mins  Philip Vasquez OTR/L Acute Rehabilitation Services Office: [place] 02/22/2019, 1:00 PM

## 2019-02-22 NOTE — Progress Notes (Signed)
Physical Therapy Treatment Patient Details Name: Philip Vasquez MRN: [id number] DOB: [birth year] Today's Date: 02/22/2019    History of Present Illness Philip Vasquez is a 58 year old gentleman with no past medical history who presented with AMS and right-sided weakness. MRI: Widely scattered small acute infarcts in the medial lefthemisphere involving left ACA and left ACA/MCA watershed area.    PT Comments    Pt performed LE Knee extension to break tone for positioning in bed.  He refused OOB but did agree to bed level ADLs and mobility.  Pt performing rolling to R and L side.  He continues to benefit from snf placement at d/c to continue to improve.  His self limiting behavior continues to be difficult to progress him at this point.     Follow Up Recommendations  Supervision/Assistance - 24 hour;SNF     Equipment Recommendations  (defer)    Recommendations for Other Services       Precautions / Restrictions Precautions Precautions: Fall Precaution Comments: apneic, R UE/LE weakness Restrictions Weight Bearing Restrictions: No    Mobility  Bed Mobility Overal bed mobility: Needs Assistance Bed Mobility: Rolling Rolling: Min assist;Mod assist         General bed mobility comments: rolled R<>L, declined OOB mobility this session required facilitation to turn hips and reach for railing in sidelying.  POst rolling to clean patient position in chair position with wedge placed by R LE.  Transfers                 General transfer comment: pt declined this session  Ambulation/Gait                 Stairs             Wheelchair Mobility    Modified Rankin (Stroke Patients Only)       Balance Overall balance assessment: Needs assistance Sitting-balance support: No upper extremity supported Sitting balance-Leahy Scale: Poor                                      Cognition Arousal/Alertness: Awake/alert Behavior During Therapy: WFL for  tasks assessed/performed;Flat affect Overall Cognitive Status: Impaired/Different from baseline Area of Impairment: Following commands;Attention;Problem solving                   Current Attention Level: Selective   Following Commands: Follows one step commands consistently     Problem Solving: Difficulty sequencing;Requires verbal cues;Requires tactile cues;Slow processing General Comments: LUE wandering at times, pt required increased effort during bimanual tasks, requires vc to visually attend to BUE during functioanl task, required multimodal cue to process squeezing lotion from bottle      Exercises Other Exercises Other Exercises: supine R knee extension to break tone.  Pt with clonic jerking initially but then able to relax LE.    General Comments General comments (skin integrity, edema, etc.): VSS throughout session      Pertinent Vitals/Pain Pain Assessment: Faces Faces Pain Scale: Hurts even more Pain Location: RLE with movement Pain Descriptors / Indicators: Grimacing Pain Intervention(s): Monitored during session;Repositioned    Home Living                      Prior Function            PT Goals (current goals can now be found in the care plan section) Acute Rehab PT Goals Patient  Stated Goal: none stated Potential to Achieve Goals: Fair Progress towards PT goals: Progressing toward goals    Frequency    Min 1X/week      PT Plan Current plan remains appropriate    Co-evaluation PT/OT/SLP Co-Evaluation/Treatment: Yes Reason for Co-Treatment: Complexity of the patient's impairments (multi-system involvement);For patient/therapist safety;To address functional/ADL transfers PT goals addressed during session: Mobility/safety with mobility;Strengthening/ROM OT goals addressed during session: ADL's and self-care      AM-PAC PT "6 Clicks" Mobility   Outcome Measure  Help needed turning from your back to your side while in a flat bed  without using bedrails?: A Lot Help needed moving from lying on your back to sitting on the side of a flat bed without using bedrails?: Total Help needed moving to and from a bed to a chair (including a wheelchair)?: Total Help needed standing up from a chair using your arms (e.g., wheelchair or bedside chair)?: Total Help needed to walk in hospital room?: Total Help needed climbing 3-5 steps with a railing? : Total 6 Click Score: 7    End of Session Equipment Utilized During Treatment: Gait belt Activity Tolerance: Patient tolerated treatment well Patient left: with call bell/phone within reach;in chair;with chair alarm set Nurse Communication: Mobility status PT Visit Diagnosis: Other abnormalities of gait and mobility (R26.89);Hemiplegia and hemiparesis;Muscle weakness (generalized) (M62.81) Hemiplegia - Right/Left: Right     Time: 8101-7510 PT Time Calculation (min) (ACUTE ONLY): 24 min  Charges:  $Therapeutic Activity: 8-22 mins                     Philip Vasquez , PTA Acute Rehabilitation Services Pager [phone] Office [phone]     Philip Vasquez 02/22/2019, 3:03 PM

## 2019-02-23 NOTE — Progress Notes (Signed)
   Subjective: Philip Vasquez has no complaints today.  He denies chest pain, shortness of breath, abdominal pain, anxiety or depression.  He reports he is urinating without blood.  He has no other complaints or concerns.  Consults: na  Objective:  Vital signs in last 24 hours: Vitals:   02/22/19 1942 02/22/19 2354 02/23/19 0355 02/23/19 0902  BP: (!) 149/86 125/88 126/82 (!) 152/95  Pulse: 80 75 71 76  Resp: 18 18 18 16   Temp: 98.6 F (37 C) 99.1 F (37.3 C) 98.9 F (37.2 C) (!) 97.2 F (36.2 C)  TempSrc: Oral Oral Oral Axillary  SpO2: 97% 96% 94% 97%  Weight:      Height:        Physical Exam  Constitutional: No distress.  Cardiovascular: Normal rate, regular rhythm, normal heart sounds and intact distal pulses.  No murmur heard. Pulmonary/Chest: Effort normal and breath sounds normal. No respiratory distress. He exhibits no tenderness.  Abdominal: Soft. Bowel sounds are normal. He exhibits no distension. There is no abdominal tenderness.  Musculoskeletal: Normal range of motion.  Neurological: He is alert.  Skin: Skin is warm and dry. He is not diaphoretic. No erythema.  Psychiatric: Affect normal.  Nursing note and vitals reviewed.  I/Os:  Intake/Output Summary (Last 24 hours) at 02/23/2019 1024 Last data filed at 02/22/2019 1912 Gross per 24 hour  Intake 200.3 ml  Output 1300 ml  Net -1099.7 ml    Telemetry:  Labs: CBC Latest Ref Rng & Units 02/21/2019 02/08/2019 02/04/2019  WBC 4.0 - 10.5 K/uL 6.5 7.4 8.0  Hemoglobin 13.0 - 17.0 g/dL 9.5(L) 8.8(L) 8.5(L)  Hematocrit 39.0 - 52.0 % 30.8(L) 27.9(L) 26.8(L)  Platelets 150 - 400 K/uL 255 294 330    Assessment/Plan:  Assessment: Philip Vasquez is a 58 year old male with daily cocaine use and no previously recorded medical history who presented with altered mental status and right-sided weakness, found to have left ACA/MCA watershed infarcts now status post 3 weeks of dual antiplatelet therapy whose stay has been  complicated by urinary and GI bleeding, CAUTI and depression, currently on last day of antibiotics and with improvement in mood on prozac and remeron. Patient medically stable for discharge however disposition difficult given insurance status. Social work and case management are working to obtain appropriate posthospitalization disposition.  Plan: Principal Problem:   Cerebral thrombosis with cerebral infarction   Right sided weakness   Hypertension   Hyperlipidemia  -Status post 3 weeks of dual antiplatelet therapy -Continue aspirin and statin -Baclofen twice daily -Continue PT/OT/ST   Active Problems:   Urinary retention -urinating well, net negative 1L in last 24 hrs -continue flomax 0.8 daily  -CAUTI treatment with rocephin through 11/17    Hematuria -No hematuria noted recently    Anemia of chronic disease/Acute blood loss anemia -CBC 11/15 with stable/increased hemoglobin of 9.5 up from 8.8    Depression -reports his mood is good -Continue Prozac and Remeron  Dispo: Anticipated discharge pending Medicaid approval and SNF placement.  Al Decant, MD 02/23/2019, 10:24 AM Pager: [phone]

## 2019-02-24 NOTE — Progress Notes (Signed)
Nutrition Follow-up  RD working remotely.   DOCUMENTATION CODES:   Obesity unspecified  INTERVENTION:  - continue Ensure Enlive BID and 30 ml prostat BID. - continue to encourage PO intakes.    NUTRITION DIAGNOSIS:   Inadequate oral intake related to lethargy/confusion as evidenced by estimated needs(reported meal completion). -improving  GOAL:   Patient will meet greater than or equal to 90% of their needs -met on average  MONITOR:   PO intake, Supplement acceptance, Skin, Weight trends, Labs, I & O's  ASSESSMENT:   58 year old man with hyperlipidemia, hypertension, and cocaine use disorder admitted with right upper and lower extremity weakness admitted with scattered acute infarcts in the medial left ACA and MCA watershed distribution.  Weight has been stable over the past 1 month. Flow sheet documentation indicates patient has been eating 75-90% of meals over the past week. He has been accepting Ensure ~50% of the time offered and prostat 100% of the time offered.   Per notes: - R-sided weakness - s/p 3 weeks dual antiplatelet therapy - ongoing urinary retention and abx course completed 11/17 - anemia of chronic disease  - discharge pending Medicaid approval and SNF bed    Labs reviewed; no BMP since 10/29. Medications reviewed; 1 mg folvite/day, daily multivitamin with minerals, 40 mg oral protonix/day, 1 packet miralax BID, 2 tablets senokot/day, 100 mg thiamine/day.    Diet Order:   Diet Order            Diet regular Room service appropriate? Yes with Assist; Fluid consistency: Thin  Diet effective now              EDUCATION NEEDS:   Not appropriate for education at this time  Skin:  Skin Assessment: Skin Integrity Issues: Skin Integrity Issues:: Other (Comment) Other: N/A  Last BM:  11/15  Height:   Ht Readings from Last 1 Encounters:  12/19/18 5' 9" (1.753 m)    Weight:   Wt Readings from Last 1 Encounters:  02/15/19 125 kg    Ideal  Body Weight:  72.7 kg  BMI:  Body mass index is 40.7 kg/m.  Estimated Nutritional Needs:   Kcal:  2000-2200  Protein:  100-115 grams  Fluid:  >/= 2 L/day     Jessica Ostheim, MS, RD, LDN, CNSC Inpatient Clinical Dietitian Pager # [phone] After hours/weekend pager # [phone]  

## 2019-02-24 NOTE — Progress Notes (Signed)
  Speech Language Pathology Treatment: Cognitive-Linquistic  Patient Details Name: Philip Vasquez MRN: [id number] DOB: [birth year] Today's Date: 02/24/2019 Time: 1420-1450 SLP Time Calculation (min) (ACUTE ONLY): 30 min  Assessment / Plan / Recommendation Clinical Impression  Pt seen at bedside for skilled ST intervention targeting goals for communication of wants/needs, problem solving, and attention. Pt was cooperative with unfamiliar therapist throughout session, but kept his eyes closed most of the time. He was able to answer questions and request a Coke. Slight and intermittent dysfluencies noted, which pt reports is new with this stroke. Pt is Ox4. Decreased immediate and delayed independent recall of unrelated words, however, pt recognized 4/5 words with category cue. Reduced thought organization also noted, with pt able to provide only 6 items to a concrete category. Pt performed well with simple mental math tasks with min cues. Difficulty with verbal abstract reasoning tasks were also noted.   Pt reports he is ready to return to home and work and resume prior responsibilities, and is unable to indicate any deficits related to this CVA aside from his "thinking", but unable to elaborate further. This indicates poor awareness of deficits and functional impact.    HPI HPI: Pt is a 58 year old gentleman with no past medical history who presented with AMS and right-sided weakness. MRI: Widely scattered small acute infarcts in the medial lefthemisphere involving left ACA and left ACA/MCA watershed area. UA positive for cocaine use.       SLP Plan  Goals updated       Recommendations  24 hour supervision                Follow up Recommendations: 24 hour supervision/assistance;Skilled Nursing facility SLP Visit Diagnosis: Cognitive communication deficit (L87.564) Plan: Goals updated       [place], [place], [place] Speech Language Pathologist Office:  [phone] Pager: [phone]   Shonna Chock 02/24/2019, 2:57 PM

## 2019-02-24 NOTE — Progress Notes (Signed)
   Subjective: Mr. Rinkenberger has no complaints today.  He is resting comfortably in bed.    Consults: na  Objective:  Vital signs in last 24 hours: Vitals:   02/23/19 1537 02/23/19 1950 02/23/19 2318 02/24/19 0351  BP: (!) 131/94 133/85 124/71 128/86  Pulse: 73 75 71 67  Resp: 16 18 18 18   Temp: 98.5 F (36.9 C) 99.1 F (37.3 C) 98 F (36.7 C) 98.5 F (36.9 C)  TempSrc: Oral Oral Oral Oral  SpO2: 98% 95% 92% 100%  Weight:      Height:        Physical Exam  Constitutional: No distress.  Cardiovascular: Normal rate, regular rhythm, normal heart sounds and intact distal pulses.  No murmur heard. Pulmonary/Chest: Effort normal and breath sounds normal. No respiratory distress. He exhibits no tenderness.  Abdominal: Soft. Bowel sounds are normal. He exhibits no distension. There is no abdominal tenderness.  Musculoskeletal: Normal range of motion.  Neurological: He is alert.  Skin: Skin is warm and dry. He is not diaphoretic. No erythema.  Psychiatric: Affect normal.  Nursing note and vitals reviewed.  I/Os:  Intake/Output Summary (Last 24 hours) at 02/24/2019 0826 Last data filed at 02/24/2019 0351 Gross per 24 hour  Intake -  Output 1100 ml  Net -1100 ml    Telemetry:  Labs: CBC Latest Ref Rng & Units 02/21/2019 02/08/2019 02/04/2019  WBC 4.0 - 10.5 K/uL 6.5 7.4 8.0  Hemoglobin 13.0 - 17.0 g/dL 9.5(L) 8.8(L) 8.5(L)  Hematocrit 39.0 - 52.0 % 30.8(L) 27.9(L) 26.8(L)  Platelets 150 - 400 K/uL 255 294 330    Assessment/Plan:  Assessment: Mr. Griswold is a 58 year old male with daily cocaine use and no previously recorded medical history who presented with altered mental status and right-sided weakness, found to have left ACA/MCA watershed infarcts now status post 3 weeks of dual antiplatelet therapy whose stay has been complicated by urinary and GI bleeding, CAUTI status post antibiotics and depression with improvement in mood on prozac and remeron. Patient medically  stable for discharge however disposition difficult given insurance status. Social work and case management are working to obtain appropriate posthospitalization disposition.  Plan: Principal Problem:   Cerebral thrombosis with cerebral infarction   Right sided weakness   Hypertension   Hyperlipidemia  -Status post 3 weeks of dual antiplatelet therapy -Continue aspirin and statin -Baclofen twice daily -Continue PT/OT/ST   Active Problems:   Urinary retention -urinating well, net negative 1L in last 24 hrs -continue flomax 0.8 daily  -CAUTI treatment with rocephin complete yesterday    Hematuria -No hematuria noted recently    Anemia of chronic disease/Acute blood loss anemia -CBC 11/15 with stable/increased hemoglobin of 9.5 up from 8.8    Depression -reports his mood is good -Continue Prozac and Remeron  Dispo: Anticipated discharge pending Medicaid approval and SNF placement.  Al Decant, MD 02/24/2019, 8:26 AM Pager: [phone]

## 2019-02-25 MED ORDER — FLUOXETINE HCL 20 MG PO CAPS
60.0000 mg | ORAL_CAPSULE | Freq: Every day | ORAL | Status: DC
  Administered 2019-02-26 – 2019-05-03 (×67): 60 mg via ORAL
  Filled 2019-02-25 (×69): qty 3

## 2019-02-25 NOTE — Progress Notes (Signed)
   Subjective: Mr. Everage denies pain. He does report feeling sad. When asked if he would like one of his depression medicines increased he nodded in agreement.  Consults: na  Objective:  Vital signs in last 24 hours: Vitals:   02/24/19 1951 02/24/19 2320 02/25/19 0425 02/25/19 0811  BP: 124/77 (!) 135/98 117/83 130/89  Pulse: 77 71 71 64  Resp: 16 18 19 16   Temp: 98.7 F (37.1 C) 98.6 F (37 C) 98.3 F (36.8 C) 97.8 F (36.6 C)  TempSrc: Oral Oral Oral Oral  SpO2: 97% 97% 97% 95%  Weight:      Height:        Physical Exam  Constitutional: No distress.  Cardiovascular: Normal rate, regular rhythm, normal heart sounds and intact distal pulses.  No murmur heard. Pulmonary/Chest: Effort normal. No respiratory distress. He exhibits no tenderness.  Abdominal: Soft. Bowel sounds are normal. He exhibits no distension. There is no abdominal tenderness.  Musculoskeletal: Normal range of motion.  Neurological: He is alert.  Skin: Skin is warm and dry. He is not diaphoretic. No erythema. There is pallor (pallorous finger tips).  Psychiatric:  Dysphoric mood  Nursing note and vitals reviewed.  I/Os:  Intake/Output Summary (Last 24 hours) at 02/25/2019 1016 Last data filed at 02/25/2019 0425 Gross per 24 hour  Intake 120 ml  Output 700 ml  Net -580 ml   Labs: CBC Latest Ref Rng & Units 02/21/2019 02/08/2019 02/04/2019  WBC 4.0 - 10.5 K/uL 6.5 7.4 8.0  Hemoglobin 13.0 - 17.0 g/dL 9.5(L) 8.8(L) 8.5(L)  Hematocrit 39.0 - 52.0 % 30.8(L) 27.9(L) 26.8(L)  Platelets 150 - 400 K/uL 255 294 330    Assessment/Plan:  Assessment: Mr. Brammer is a 58 year old male with daily cocaine use and no previously recorded medical history who presented with altered mental status and right-sided weakness, found to have left ACA/MCA watershed infarcts now status post 3 weeks of dual antiplatelet therapy whose stay has been complicated by urinary and GI bleeding, CAUTI status post antibiotics and  depression with improvement in mood on prozac and remeron. Patient medically stable for discharge however disposition difficult given insurance status. Social work and case management are working to obtain appropriate posthospitalization disposition.  Plan: Principal Problem:   Cerebral thrombosis with cerebral infarction   Right sided weakness   Hypertension   Hyperlipidemia  -Status post 3 weeks of dual antiplatelet therapy -Continue aspirin and statin -Baclofen twice daily -Continue PT/OT/ST  Active Problems:   Urinary retention -urinating well, net negative 0.6 L in last 24 hrs -continue flomax 0.8 daily     Anemia of chronic disease/Acute blood loss anemia -CBC 11/15 with stable/increased hemoglobin of 9.5 up from 8.8 -pallorous but warm finger tips on exam with good cap refill -CBC tomorrow    Depression -Pt reports sadness today -We will increase Prozac from 40 to 60 mg daily -Continue mirtazapine at current dose  Dispo: Anticipated discharge pending Medicaid approval and SNF placement.  Al Decant, MD 02/25/2019, 10:16 AM Pager: [phone]

## 2019-02-26 LAB — CBC
HCT: 30.8 % — ABNORMAL LOW (ref 39.0–52.0)
Hemoglobin: 9.8 g/dL — ABNORMAL LOW (ref 13.0–17.0)
MCH: 28.4 pg (ref 26.0–34.0)
MCHC: 31.8 g/dL (ref 30.0–36.0)
MCV: 89.3 fL (ref 80.0–100.0)
Platelets: 289 10*3/uL (ref 150–400)
RBC: 3.45 MIL/uL — ABNORMAL LOW (ref 4.22–5.81)
RDW: 13.5 % (ref 11.5–15.5)
WBC: 6.3 10*3/uL (ref 4.0–10.5)
nRBC: 0 % (ref 0.0–0.2)

## 2019-02-26 NOTE — Plan of Care (Signed)
Patient progressing toward plan of care goals. 

## 2019-02-26 NOTE — Progress Notes (Signed)
   Subjective: Mr. Selner lying comfortably in bed. In no distress. Says he prefers [employer] to college football.   Consults: na  Objective:  Vital signs in last 24 hours: Vitals:   02/25/19 1953 02/25/19 2320 02/26/19 0405 02/26/19 1000  BP: (!) 135/91 118/89 125/80 125/86  Pulse: 75 67 69 81  Resp: 18 18 18 18   Temp: 98.2 F (36.8 C) 98.4 F (36.9 C) 98 F (36.7 C) 98.1 F (36.7 C)  TempSrc: Oral Oral Oral Oral  SpO2: 97% 98% 97% 98%  Weight:      Height:        Physical Exam  Constitutional: No distress.  Cardiovascular: Normal rate, regular rhythm, normal heart sounds and intact distal pulses.  No murmur heard. Pulmonary/Chest: Effort normal. No respiratory distress. He exhibits no tenderness.  Abdominal: Soft. Bowel sounds are normal. He exhibits no distension. There is no abdominal tenderness.  Musculoskeletal: Normal range of motion.  Neurological: He is alert.  Skin: Skin is warm and dry. He is not diaphoretic. No erythema.  Psychiatric:  Dysphoric mood  Nursing note and vitals reviewed.  I/Os:  Intake/Output Summary (Last 24 hours) at 02/26/2019 1055 Last data filed at 02/26/2019 1000 Gross per 24 hour  Intake 560 ml  Output 450 ml  Net 110 ml   Labs: CBC Latest Ref Rng & Units 02/26/2019 02/21/2019 02/08/2019  WBC 4.0 - 10.5 K/uL 6.3 6.5 7.4  Hemoglobin 13.0 - 17.0 g/dL 9.8(L) 9.5(L) 8.8(L)  Hematocrit 39.0 - 52.0 % 30.8(L) 30.8(L) 27.9(L)  Platelets 150 - 400 K/uL 289 255 294    Assessment/Plan:  Assessment: Mr. Curfman is a 58 year old male with daily cocaine use and no previously recorded medical history who presented with altered mental status and right-sided weakness, found to have left ACA/MCA watershed infarcts now status post 3 weeks of dual antiplatelet therapy whose stay has been complicated by urinary and GI bleeding, CAUTI status post antibiotics and depression with improvement in mood on prozac and remeron. Patient medically stable for  discharge however disposition difficult given insurance status. Social work and case management are working to obtain appropriate posthospitalization disposition.  Plan: Principal Problem:   Cerebral thrombosis with cerebral infarction   Right sided weakness   Hypertension   Hyperlipidemia  -Status post 3 weeks of dual antiplatelet therapy -Continue aspirin and statin -Baclofen twice daily -Continue PT/OT/ST  Active Problems:   Urinary retention -urinating well -continue flomax 0.8 daily     Anemia of chronic disease/Acute blood loss anemia -CBC 11/20 with increasing hemoglobin of 9.8     Depression -Pt denies sadness today -Continue Prozac 60 mg daily  -Continue mirtazapine at current dose  Dispo: Anticipated discharge pending Medicaid approval and SNF placement.  Al Decant, MD 02/26/2019, 10:55 AM Pager: [phone]

## 2019-02-27 NOTE — Plan of Care (Signed)
Patient progressing towards plan of care goals. 

## 2019-02-27 NOTE — Progress Notes (Signed)
   Subjective: No acute events overnight. Patient is urinating on his own without issues. No dysuria or hematuria. Endorses good appetite and PO intake.   Objective:  Vital signs in last 24 hours: Vitals:   02/26/19 1655 02/26/19 2039 02/26/19 2356 02/27/19 0355  BP: 132/86 135/80 111/82 120/82  Pulse: 76 82 70 67  Resp: 20 18 18 16   Temp: 98.9 F (37.2 C) 98.8 F (37.1 C) 98.2 F (36.8 C) 97.9 F (36.6 C)  TempSrc: Oral Oral Oral Oral  SpO2: 99% 96% 98% 98%  Weight:      Height:       General: awake, alert, lying comfortably in bed in NAD Pulm: normal work of breathing on room air Abd: non-distended Neuro: answers questions appropriately    Assessment/Plan:  Principal Problem:   Cerebral thrombosis with cerebral infarction Active Problems:   Right sided weakness   Hypertension   Hyperlipidemia   Ulcer of penis   Urinary retention   Depression   Hematuria   Anemia of chronic disease   Acute blood loss anemia  Assessment: Philip Vasquez is a 58 year old male with daily cocaine use and no previously recorded medical history who presented with altered mental status and right-sided weakness, found to have left ACA/MCA watershed infarcts now status post 3 weeks of dual antiplatelet therapy whose stay has been complicated by urinary and GI bleeding, CAUTI status post antibiotics and depression with improvement in mood on prozac and remeron. Patient medically stable for discharge however disposition difficult given insurance status. Social work and case management are working to obtain appropriate posthospitalization disposition.  Plan: Principal Problem:   Cerebral thrombosis with cerebral infarction   Right sided weakness   Hypertension   Hyperlipidemia  -Status post 3 weeks of dual antiplatelet therapy -Continue aspirin and statin -Baclofen twice daily -Continue PT/OT/ST  Active Problems: Urinary retention -urinating well -continue flomax 0.8 daily    Anemia  of chronic disease/Acute blood loss anemia -CBC 11/20 with increasing hemoglobin of 9.8    Depression -Pt denies sadness today -Continue Prozac 60 mg daily  -Continue mirtazapine at current dose  Dispo: Anticipated discharge pending Medicaid approval and SNF placement.   Modena Nunnery D, DO 02/27/2019, 6:55 AM Pager: [phone]

## 2019-02-28 NOTE — Plan of Care (Signed)
Patient progressing towards plan of care goals. 

## 2019-02-28 NOTE — Progress Notes (Signed)
   Subjective: No acute events overnight. Urinating well on his own. No complaints of pain anywhere.   Objective:  Vital signs in last 24 hours: Vitals:   02/27/19 1946 02/27/19 2334 02/28/19 0448 02/28/19 0744  BP: 134/81 111/82 (!) 131/93 132/90  Pulse: 70 60 63 66  Resp: 18 18 19 18   Temp: 99 F (37.2 C) 97.9 F (36.6 C) 98 F (36.7 C) 98.1 F (36.7 C)  TempSrc: Oral Oral Oral Oral  SpO2: 98% 99% 100% 100%  Weight:      Height:       General: awake, alert, lying comfortably in bed in NAD Pulm: normal work of breathing on room air Abd: non-distended Psych: appropriate mood and affect   Assessment/Plan:  Principal Problem:   Cerebral thrombosis with cerebral infarction Active Problems:   Right sided weakness   Hypertension   Hyperlipidemia   Ulcer of penis   Urinary retention   Depression   Hematuria   Anemia of chronic disease   Acute blood loss anemia  Assessment: [name] is a 58 year old male with daily cocaine use and no previously recorded medical history who presented with altered mental status and right-sided weakness, found to have left ACA/MCA watershed infarcts now status post 3 weeks of dual antiplatelet therapy whose stay has been complicated by urinary and GI bleeding, CAUTI status post antibiotics and depression with improvement in mood on prozac and remeron. Patient medically stable for discharge however disposition difficult given insurance status. Social work and case management are working to obtain appropriate posthospitalization disposition.  Plan: Principal Problem: Cerebral thrombosis with cerebral infarction Right sided weakness Hypertension Hyperlipidemia  -Status post 3 weeks of dual antiplatelet therapy -Continue aspirin and statin -Baclofen twice daily -Continue PT/OT/ST  Active Problems: Urinary retention -urinating well -continue flomax 0.8 daily   Anemia of chronic disease/Acute blood loss anemia -CBC  11/20withincreasinghemoglobin of 9.8  Depression - seems to be doing better on increased Prozac 60 mg daily -Continue mirtazapine at current dose  Dispo: Anticipated discharge pending Medicaid approval and SNF placement.   Modena Nunnery D, DO 02/28/2019, 11:26 AM Pager: [phone]

## 2019-03-01 MED ORDER — ENOXAPARIN SODIUM 40 MG/0.4ML ~~LOC~~ SOLN
40.0000 mg | SUBCUTANEOUS | Status: DC
  Administered 2019-03-01 – 2019-04-21 (×52): 40 mg via SUBCUTANEOUS
  Filled 2019-03-01 (×51): qty 0.4

## 2019-03-01 NOTE — Plan of Care (Signed)
Patient progressing towards plan of care goals. 

## 2019-03-01 NOTE — Progress Notes (Signed)
   Subjective: Mr. Pollio lying comfortably in bed. In no distress. He is happy the [place] won yesterday.   Consults: na  Objective:  Vital signs in last 24 hours: Vitals:   02/28/19 1941 03/01/19 0006 03/01/19 0332 03/01/19 0700  BP: 119/73 103/66 108/72 (!) 174/93  Pulse: 76 67 68 75  Resp: 19 (!) 21 17 18   Temp: 99.1 F (37.3 C) 98.4 F (36.9 C) 98.5 F (36.9 C) 98.6 F (37 C)  TempSrc: Oral Oral Oral Oral  SpO2: 97% 96% 100% 100%  Weight:      Height:        Physical Exam  Constitutional: No distress.  Cardiovascular: Normal rate, regular rhythm, normal heart sounds and intact distal pulses.  No murmur heard. Pulmonary/Chest: Effort normal. No respiratory distress. He exhibits no tenderness.  Abdominal: Soft. Bowel sounds are normal. He exhibits no distension. There is no abdominal tenderness.  Musculoskeletal: Normal range of motion.  Neurological: He is alert.  Skin: Skin is warm and dry. He is not diaphoretic. No erythema.  Psychiatric: Mood normal.  Nursing note and vitals reviewed.  I/Os:  Intake/Output Summary (Last 24 hours) at 03/01/2019 0934 Last data filed at 03/01/2019 0800 Gross per 24 hour  Intake 1140 ml  Output 950 ml  Net 190 ml   Labs: CBC Latest Ref Rng & Units 02/26/2019 02/21/2019 02/08/2019  WBC 4.0 - 10.5 K/uL 6.3 6.5 7.4  Hemoglobin 13.0 - 17.0 g/dL 9.8(L) 9.5(L) 8.8(L)  Hematocrit 39.0 - 52.0 % 30.8(L) 30.8(L) 27.9(L)  Platelets 150 - 400 K/uL 289 255 294    Assessment/Plan:  Assessment: Mr. Soffer is a 58 year old male with daily cocaine use and no previously recorded medical history who presented with altered mental status and right-sided weakness, found to have left ACA/MCA watershed infarcts now status post 3 weeks of dual antiplatelet therapy whose stay has been complicated by urinary and GI bleeding, CAUTI status post antibiotics and depression with improvement in mood on prozac and remeron. Patient medically stable for  discharge however disposition difficult given insurance status. Social work and case management are working to obtain appropriate posthospitalization disposition.  Plan: Principal Problem:   Cerebral thrombosis with cerebral infarction   Right sided weakness   Hypertension   Hyperlipidemia  -Status post 3 weeks of dual antiplatelet therapy -Continue aspirin and statin -Baclofen twice daily -Continue PT/OT/ST  Active Problems:   Urinary retention -urinating well -continue flomax 0.8 daily     Anemia of chronic disease/Acute blood loss anemia -CBC 11/20 with increasing hemoglobin of 9.8     Depression -Pt denies sadness today -Continue Prozac 60 mg daily  -Continue mirtazapine at current dose  Dispo: Anticipated discharge pending Medicaid approval and SNF placement.  Al Decant, MD 03/01/2019, 9:34 AM Pager: [phone]

## 2019-03-01 NOTE — Progress Notes (Signed)
  Speech Language Pathology Treatment: Cognitive-Linquistic  Patient Details Name: Philip Vasquez MRN: [id number] DOB: 02-Jan-1961 Today's Date: 03/01/2019 Time: 3568-6168 SLP Time Calculation (min) (ACUTE ONLY): 10 min  Assessment / Plan / Recommendation Clinical Impression  Pt was encountered awake/alert sitting upright in bed and he was pleasant and cooperative throughout this tx session.  Pt was much more willing to participate in treatment today in comparison to previous sessions.  Treatment focused on cognitive functioning on this date.  Pt answered moderately complex problem solving questions targeting money and medication management.  Pt answered questions with overall 6/9 (67%) accuracy independently, improving to 100% accuracy given min-mod verbal cues.  Pt exhibited more difficulty with medication based problem solving questions in comparison to money management.   He additionally completed sustained attention tasks with 100% accuracy independently.  Recommend continuation of skilled ST targeting cognitive deficits.     HPI HPI: Pt is a 58 year old gentleman with no past medical history who presented with AMS and right-sided weakness. MRI: Widely scattered small acute infarcts in the medial lefthemisphere involving left ACA and left ACA/MCA watershed area. UA positive for cocaine use.       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

## 2019-03-01 NOTE — Progress Notes (Signed)
Physical Therapy Treatment Patient Details Name: Philip Vasquez MRN: [id number] DOB: [birth year] Today's Date: 03/01/2019    History of Present Illness Philip Vasquez is a 58 year old gentleman with no past medical history who presented with AMS and right-sided weakness. MRI: Widely scattered small acute infarcts in the medial lefthemisphere involving left ACA and left ACA/MCA watershed area.    PT Comments    Limited bed level assessment today due to pt refusing EOB or OOB mobility when offered.  Goals are due and were updated.  Pt is self limiting.  PT will continue to follow acutely for safe mobility progression   Follow Up Recommendations  SNF;Supervision/Assistance - 24 hour     Equipment Recommendations  Wheelchair (measurements PT);Wheelchair cushion (measurements PT);Hospital bed;Other (comment)(hoyer lift)    Recommendations for Other Services   NA     Precautions / Restrictions Precautions Precautions: Fall Precaution Comments: R UE/LE weakness/tone    Mobility  Bed Mobility Overal bed mobility: Needs Assistance Bed Mobility: Rolling Rolling: Min assist;Mod assist         General bed mobility comments: Rolling to the right is min assist, to the left mod assist.   Transfers                 General transfer comment: Pt refused EOB or OOB mobility      Modified Rankin (Stroke Patients Only) Modified Rankin (Stroke Patients Only) Pre-Morbid Rankin Score: Moderate disability Modified Rankin: Severe disability        Cognition Arousal/Alertness: Awake/alert Behavior During Therapy: WFL for tasks assessed/performed Overall Cognitive Status: Impaired/Different from baseline Area of Impairment: Following commands;Attention;Problem solving                   Current Attention Level: Selective   Following Commands: Follows one step commands consistently Safety/Judgement: Decreased awareness of safety;Decreased awareness of deficits Awareness:  Intellectual Problem Solving: Difficulty sequencing;Requires verbal cues;Requires tactile cues;Slow processing General Comments: Attempted to discuss why it was important to get up and move around with therapy, but poor relation of bedrest to further medical complications.          General Comments General comments (skin integrity, edema, etc.): Pt continues to have bil UE and bil LE weakness R>L with R LE significant extension tone at the knee especially.  He is able to actively lift the R UE in gravity reduced position above his head as well as L UE.  He is able to lift his left leg against gravity and bend his left knee.        Pertinent Vitals/Pain Pain Assessment: Faces Faces Pain Scale: Hurts even more Pain Location: RLE with movement Pain Descriptors / Indicators: Grimacing Pain Intervention(s): Limited activity within patient's tolerance;Monitored during session;Repositioned           PT Goals (current goals can now be found in the care plan section) Acute Rehab PT Goals Patient Stated Goal: currently to not move PT Goal Formulation: With patient Time For Goal Achievement: 03/15/19 Potential to Achieve Goals: Fair Progress towards PT goals: Not progressing toward goals - comment(self limiting)    Frequency    Min 1X/week      PT Plan Current plan remains appropriate       AM-PAC PT "6 Clicks" Mobility   Outcome Measure  Help needed turning from your back to your side while in a flat bed without using bedrails?: A Lot Help needed moving from lying on your back to sitting on the side of a flat  bed without using bedrails?: Total Help needed moving to and from a bed to a chair (including a wheelchair)?: Total Help needed standing up from a chair using your arms (e.g., wheelchair or bedside chair)?: Total Help needed to walk in hospital room?: Total Help needed climbing 3-5 steps with a railing? : Total 6 Click Score: 7    End of Session   Activity Tolerance:  Patient limited by pain Patient left: in bed;with call bell/phone within reach Nurse Communication: Mobility status PT Visit Diagnosis: Other abnormalities of gait and mobility (R26.89);Hemiplegia and hemiparesis;Muscle weakness (generalized) (M62.81) Hemiplegia - Right/Left: Right Hemiplegia - dominant/non-dominant: Dominant Hemiplegia - caused by: Cerebral infarction     Time: 5638-9373 PT Time Calculation (min) (ACUTE ONLY): 8 min  Charges:  $Therapeutic Activity: 8-22 mins           Corinna Capra, PT, DPT  Acute Rehabilitation [phone] pager #[phone] office  @ [facility]: [phone]            03/01/2019, 5:22 PM

## 2019-03-02 MED ORDER — ENSURE ENLIVE PO LIQD
237.0000 mL | Freq: Every day | ORAL | Status: DC
  Administered 2019-03-03 – 2019-05-26 (×20): 237 mL via ORAL

## 2019-03-02 NOTE — Progress Notes (Signed)
   Subjective: [name] is lying comfortably in bed in no distress. He has no needs or concerns.  Consults: na  Objective:  Vital signs in last 24 hours: Vitals:   03/01/19 2012 03/01/19 2339 03/02/19 0335 03/02/19 0940  BP: (!) 146/78 93/68 120/76 (!) 133/95  Pulse: 80 68 68 77  Resp: 19 19 18 20   Temp: 98.3 F (36.8 C) 98.9 F (37.2 C) (!) 97.5 F (36.4 C) 98.4 F (36.9 C)  TempSrc: Oral Oral Oral Oral  SpO2: 96% 96% 96% 99%  Weight:      Height:        Physical Exam  Constitutional: No distress.  Cardiovascular: Normal rate, regular rhythm, normal heart sounds and intact distal pulses.  No murmur heard. Pulmonary/Chest: Effort normal. No respiratory distress. He exhibits no tenderness.  Abdominal: Soft. Bowel sounds are normal. He exhibits no distension. There is no abdominal tenderness.  Musculoskeletal: Normal range of motion.  Neurological: He is alert.  Skin: Skin is warm and dry. He is not diaphoretic. No erythema.  Psychiatric: Mood normal.  Nursing note and vitals reviewed.  I/Os:  Intake/Output Summary (Last 24 hours) at 03/02/2019 1005 Last data filed at 03/01/2019 2144 Gross per 24 hour  Intake 580 ml  Output 550 ml  Net 30 ml   Labs: CBC Latest Ref Rng & Units 02/26/2019 02/21/2019 02/08/2019  WBC 4.0 - 10.5 K/uL 6.3 6.5 7.4  Hemoglobin 13.0 - 17.0 g/dL 9.8(L) 9.5(L) 8.8(L)  Hematocrit 39.0 - 52.0 % 30.8(L) 30.8(L) 27.9(L)  Platelets 150 - 400 K/uL 289 255 294    Assessment/Plan:  Assessment: [name] is a 58 year old male with daily cocaine use and no previously recorded medical history who presented with altered mental status and right-sided weakness, found to have left ACA/MCA watershed infarcts now status post 3 weeks of dual antiplatelet therapy whose stay has been complicated by urinary and GI bleeding, CAUTI status post antibiotics and depression with improvement in mood on prozac and remeron. Patient medically stable for discharge  however disposition difficult given insurance status. Social work and case management are working to obtain appropriate posthospitalization disposition.  Plan: Principal Problem:   Cerebral thrombosis with cerebral infarction   Right sided weakness   Hypertension   Hyperlipidemia  -Status post 3 weeks of dual antiplatelet therapy -Continue aspirin and statin -Baclofen twice daily -Continue PT/OT/ST  Active Problems:   Urinary retention -urinating well -continue flomax 0.8 daily     Anemia of chronic disease/Acute blood loss anemia -CBC 11/20 with increasing hemoglobin of 9.8     Depression -Pt denies sadness today -Continue Prozac 60 mg daily  -Continue mirtazapine at current dose  Dispo: Anticipated discharge pending Medicaid approval and SNF placement.  Al Decant, MD 03/02/2019, 10:05 AM Pager: [phone]

## 2019-03-02 NOTE — Plan of Care (Signed)
Patient progressing towards plan of care goals. 

## 2019-03-02 NOTE — Progress Notes (Signed)
Occupational Therapy Treatment Patient Details Name: Philip Vasquez MRN: [id number] DOB: 04/23/1960 Today's Date: 03/02/2019    History of present illness Philip Vasquez is a 58 year old gentleman with no past medical history who presented with AMS and right-sided weakness. MRI: Widely scattered small acute infarcts in the medial lefthemisphere involving left ACA and left ACA/MCA watershed area.   OT comments  Pt agreeable to OT session this date, required encouragement and education on benefit of mobility further than bed level. Pt progressed to EOB with minA, requiring assistance for physical support and sequencing. While sitting EOB pt completed grooming with minA. He required increased time and effort for tasks requiring bimanual coordination. Pt agreeable to standing next session. Required modA for lateral scooting toward HOB and minA+2 for return to supine. Pt will continue to benefit from skilled OT services to maximize safety and independence with ADL/IADL and functional mobility. Will continue to follow acutely and progress as tolerated.    Follow Up Recommendations  SNF;Supervision/Assistance - 24 hour    Equipment Recommendations  Wheelchair (measurements OT);Wheelchair cushion (measurements OT);3 in 1 bedside commode    Recommendations for Other Services      Precautions / Restrictions Precautions Precautions: Fall Precaution Comments: R UE/LE weakness/tone Restrictions Weight Bearing Restrictions: No       Mobility Bed Mobility Overal bed mobility: Needs Assistance Bed Mobility: Rolling;Supine to Sit;Sit to Supine Rolling: Min assist   Supine to sit: Min assist;+2 for safety/equipment Sit to supine: Min assist;HOB elevated   General bed mobility comments: rolling to the right minA, left roll modA;pt progressed to EOB with minA and increased time and effort:+2 for safety   Transfers Overall transfer level: Needs assistance   Transfers: Lateral/Scoot Transfers           Lateral/Scoot Transfers: Mod assist General transfer comment: pt scooted toward the [facility] with max encouragement and modA;standing deferred this session, agreeable to attempt standing next session    Balance Overall balance assessment: Needs assistance Sitting-balance support: Feet supported;Single extremity supported Sitting balance-Leahy Scale: Poor Sitting balance - Comments: intermittent no UE support, requires assist from therapist to stabilize Postural control: Right lateral lean                                 ADL either performed or assessed with clinical judgement   ADL Overall ADL's : Needs assistance/impaired     Grooming: Minimal assistance;Wash/dry face;Applying deodorant Grooming Details (indicate cue type and reason): while sitting EOB                               General ADL Comments: pt agreeable to bed level session this date; completed grooming while sitting EOB and general UE exercises sitting EOB     Vision       Perception     Praxis      Cognition Arousal/Alertness: Awake/alert Behavior During Therapy: WFL for tasks assessed/performed Overall Cognitive Status: Impaired/Different from baseline Area of Impairment: Following commands;Attention;Problem solving                   Current Attention Level: Selective   Following Commands: Follows one step commands consistently;Follows multi-step commands inconsistently;Follows multi-step commands with increased time     Problem Solving: Difficulty sequencing;Requires verbal cues;Requires tactile cues;Slow processing General Comments: required vc to sequence moving to EOB        Exercises  Exercises: General Upper Extremity;General Lower Extremity General Exercises - Upper Extremity Shoulder Flexion: AROM;5 reps;Right Shoulder Extension: AROM;5 reps;Right General Exercises - Lower Extremity Quad Sets: 5 reps;Both;Seated Hip Flexion/Marching: Both;10 reps;Seated    Shoulder Instructions       General Comments vss    Pertinent Vitals/ Pain       Pain Assessment: No/denies pain Pain Intervention(s): Monitored during session  Home Living                                          Prior Functioning/Environment              Frequency  Min 2X/week        Progress Toward Goals  OT Goals(current goals can now be found in the care plan section)  Progress towards OT goals: Progressing toward goals  Acute Rehab OT Goals Patient Stated Goal: to continue to get stronger OT Goal Formulation: With patient Time For Goal Achievement: 03/16/19 Potential to Achieve Goals: Fair ADL Goals Pt Will Perform Grooming: with set-up;sitting Pt Will Perform Upper Body Bathing: with set-up;sitting Pt Will Transfer to Toilet: stand pivot transfer;bedside commode;with mod assist Pt/caregiver will Perform Home Exercise Program: Increased ROM;Right Upper extremity;With written HEP provided Additional ADL Goal #1: Pt will be able to roll left and right with min A to help with basic ADLs Additional ADL Goal #2: Pt will be able to come up to sit EOB with min A in prep for transfers and EOB ADLs Additional ADL Goal #3: Pt will scan environment for appropriate items for functional tasks with minimal cueing.  Plan Discharge plan remains appropriate    Co-evaluation                 AM-PAC OT "6 Clicks" Daily Activity     Outcome Measure   Help from another person eating meals?: A Little Help from another person taking care of personal grooming?: A Little Help from another person toileting, which includes using toliet, bedpan, or urinal?: Total Help from another person bathing (including washing, rinsing, drying)?: A Lot Help from another person to put on and taking off regular upper body clothing?: A Little Help from another person to put on and taking off regular lower body clothing?: A Lot 6 Click Score: 14    End of Session     OT Visit Diagnosis: Hemiplegia and hemiparesis;Other symptoms and signs involving cognitive function Symptoms and signs involving cognitive functions: Cerebral infarction Hemiplegia - Right/Left: Right Hemiplegia - dominant/non-dominant: Dominant Hemiplegia - caused by: Cerebral infarction   Activity Tolerance Patient tolerated treatment well   Patient Left in bed;with call bell/phone within reach;with bed alarm set;with restraints reapplied   Nurse Communication Mobility status        Time: 0175-1025 OT Time Calculation (min): 21 min  Charges: OT General Charges $OT Visit: 1 Visit OT Treatments $Self Care/Home Management : 8-22 mins  Diona Browner OTR/L Acute Rehabilitation Services Office: [phone]    Rebeca Alert 03/02/2019, 2:07 PM

## 2019-03-02 NOTE — Progress Notes (Signed)
Nutrition Follow-up  DOCUMENTATION CODES:   Obesity unspecified  INTERVENTION:  Continue 30 ml Prostat po BID, each supplement provides 100 kcal and 15 grams of protein.   Provide Ensure Enlive po once daily, each supplement provides 350 kcal and 20 grams of protein.  Encourage adequate PO intake.   NUTRITION DIAGNOSIS:   Inadequate oral intake related to lethargy/confusion as evidenced by estimated needs(reported meal completion); ongoing  GOAL:   Patient will meet greater than or equal to 90% of their needs; met  MONITOR:   PO intake, Supplement acceptance, Skin, Weight trends, Labs, I & O's  REASON FOR ASSESSMENT:   Malnutrition Screening Tool    ASSESSMENT:   58 year old man with hyperlipidemia, hypertension, and cocaine use disorder admitted with right upper and lower extremity weakness admitted with scattered acute infarcts in the medial left ACA and MCA watershed distribution. Discharge pending insurance approval and SNF bed.   Meal completion has been 80-100%. Appetite has been fine. Pt currently has Ensure ordered with varied consumption. Pt additionally with Prostat ordered with good intake. RD to modify Ensure to once daily and will continue Prostat orders to provide adequate caloric and protein needs.    Labs and medications reviewed.   Diet Order:   Diet Order            Diet regular Room service appropriate? Yes with Assist; Fluid consistency: Thin  Diet effective now              EDUCATION NEEDS:   Not appropriate for education at this time  Skin:  Skin Assessment: Reviewed RN Assessment Skin Integrity Issues:: Other (Comment) Other: N/A  Last BM:  11/22  Height:   Ht Readings from Last 1 Encounters:  12/19/18 '5\' 9"'$  (1.753 m)    Weight:   Wt Readings from Last 1 Encounters:  02/15/19 125 kg    Ideal Body Weight:  72.7 kg  BMI:  Body mass index is 40.7 kg/m.  Estimated Nutritional Needs:   Kcal:  2000-2200  Protein:  100-115  grams  Fluid:  >/= 2 L/day    Corrin Parker, MS, RD, LDN Pager # [phone] After hours/ weekend pager # [phone]

## 2019-03-03 NOTE — TOC Progression Note (Signed)
Transition of Care [facility]) - Progression Note    Patient Details  Name: Rishikesh Khachatryan MRN: [id number] Date of Birth: [birth year]  Transition of Care [facility]) CM/SW [place], LCSW Phone Number: 03/03/2019, 9:52 AM  Clinical Narrative:    No current bed offers. CSW will continue to follow for placement.   Expected Discharge Plan: IP Rehab Facility Barriers to Discharge: SNF Pending payor source - LOG, SNF Pending bed offer, Inadequate or no insurance, Active Substance Use - Placement  Expected Discharge Plan and Services Expected Discharge Plan: [place] In-house Referral: Clinical Social Work Discharge Planning Services: CM Consult                                           Social Determinants of Health (SDOH) Interventions    Readmission Risk Interventions No flowsheet data found.

## 2019-03-03 NOTE — Progress Notes (Signed)
   Subjective: Philip Vasquez is lying comfortably in bed in no distress. He has no needs or concerns. He reports he will try and stand with OT next time they come. He says the [employer] are ready for their next game.    Consults: na  Objective:  Vital signs in last 24 hours: Vitals:   03/02/19 2019 03/02/19 2322 03/03/19 0415 03/03/19 0743  BP: 120/74 110/77 122/74 (!) 144/96  Pulse: 76 72 64 69  Resp: 20 19 18 18   Temp: 97.7 F (36.5 C) 97.9 F (36.6 C) 98.2 F (36.8 C) 99.1 F (37.3 C)  TempSrc: Oral Oral Oral Oral  SpO2: 98% 99% 99% 98%  Weight:      Height:        Physical Exam  Constitutional: No distress.  Cardiovascular: Normal rate, regular rhythm, normal heart sounds and intact distal pulses.  No murmur heard. Pulmonary/Chest: Effort normal. No respiratory distress. He exhibits no tenderness.  Abdominal: Soft. Bowel sounds are normal. He exhibits no distension. There is no abdominal tenderness.  Musculoskeletal: Normal range of motion.  Neurological: He is alert.  Skin: Skin is warm and dry. He is not diaphoretic. No erythema.  Psychiatric: Mood normal.  Nursing note and vitals reviewed.  I/Os:  Intake/Output Summary (Last 24 hours) at 03/03/2019 1033 Last data filed at 03/03/2019 0415 Gross per 24 hour  Intake 220 ml  Output 850 ml  Net -630 ml   Labs: CBC Latest Ref Rng & Units 02/26/2019 02/21/2019 02/08/2019  WBC 4.0 - 10.5 K/uL 6.3 6.5 7.4  Hemoglobin 13.0 - 17.0 g/dL 9.8(L) 9.5(L) 8.8(L)  Hematocrit 39.0 - 52.0 % 30.8(L) 30.8(L) 27.9(L)  Platelets 150 - 400 K/uL 289 255 294    Assessment/Plan:  Assessment: Philip Vasquez is a 58 year old male with daily cocaine use and no previously recorded medical history who presented with altered mental status and right-sided weakness, found to have left ACA/MCA watershed infarcts now status post 3 weeks of dual antiplatelet therapy whose stay has been complicated by urinary and GI bleeding, CAUTI status post  antibiotics and depression with improvement in mood on prozac and remeron. Patient medically stable for discharge however disposition difficult given insurance status. Social work and case management are working to obtain appropriate posthospitalization disposition.  Plan: Principal Problem:   Cerebral thrombosis with cerebral infarction   Right sided weakness   Hypertension   Hyperlipidemia  -Status post 3 weeks of dual antiplatelet therapy -Continue aspirin and statin -Baclofen twice daily -Continue PT/OT/ST  Active Problems:   Urinary retention -urinating well -continue flomax 0.8 daily     Anemia of chronic disease/Acute blood loss anemia -CBC 11/20 with increasing hemoglobin of 9.8     Depression -Pt denies sadness today -Continue Prozac 60 mg daily  -Continue mirtazapine at current dose  Dispo: Anticipated discharge pending Medicaid approval and SNF placement.  Al Decant, MD 03/03/2019, 10:33 AM Pager: [phone]

## 2019-03-03 NOTE — Progress Notes (Signed)
  Speech Language Pathology Treatment: Cognitive-Linquistic  Patient Details Name: Philip Vasquez MRN: [id number] DOB: 01-07-1961 Today's Date: 03/03/2019 Time: 1050-1105 SLP Time Calculation (min) (ACUTE ONLY): 15 min  Assessment / Plan / Recommendation Clinical Impression  Pt was encountered awake/alert and he was pleasant and cooperative.  Treatment focused on cognitive deficits on this date.  Pt completed a medication management task targeting problem solving and attention that required him to read a medication label and answer questions.  Pt with delayed responses; however, he completed this task with 80% accuracy independently, improving to 90% accuracy given minimal verbal cues.  Pt answered all problem solving questions related to this task (i.e. amount of pills to take per day, per week, etc.) with 100% accuracy.  He additionally completed a divided attention task that was adapted from the [facility] with 100% accuracy given 1 minimal verbal cue.  ST will continue to f/u for cognitive-linguistic treatment per POC.     HPI HPI: Pt is a 58 year old gentleman with no past medical history who presented with AMS and right-sided weakness. MRI: Widely scattered small acute infarcts in the medial lefthemisphere involving left ACA and left ACA/MCA watershed area. UA positive for cocaine use.       SLP Plan  Continue with current plan of care       Recommendations                Oral Care Recommendations: Oral care BID Follow up Recommendations: 24 hour supervision/assistance;Skilled Nursing facility SLP Visit Diagnosis: Cognitive communication deficit (O03.559) Plan: Continue with current plan of care                    Philip Vasquez M.S., [place] Office: [phone]   [place] 03/03/2019, 11:08 AM

## 2019-03-04 LAB — COMPREHENSIVE METABOLIC PANEL
ALT: 46 U/L — ABNORMAL HIGH (ref 0–44)
AST: 25 U/L (ref 15–41)
Albumin: 3.4 g/dL — ABNORMAL LOW (ref 3.5–5.0)
Alkaline Phosphatase: 67 U/L (ref 38–126)
Anion gap: 8 (ref 5–15)
BUN: 22 mg/dL — ABNORMAL HIGH (ref 6–20)
CO2: 26 mmol/L (ref 22–32)
Calcium: 9.4 mg/dL (ref 8.9–10.3)
Chloride: 106 mmol/L (ref 98–111)
Creatinine, Ser: 1.32 mg/dL — ABNORMAL HIGH (ref 0.61–1.24)
GFR calc Af Amer: >60 mL/min (ref >60)
GFR calc non Af Amer: 59 mL/min — ABNORMAL LOW (ref >60)
Glucose, Bld: 101 mg/dL — ABNORMAL HIGH (ref 70–99)
Potassium: 4.1 mmol/L (ref 3.5–5.1)
Sodium: 140 mmol/L (ref 135–145)
Total Bilirubin: 0.5 mg/dL (ref 0.3–1.2)
Total Protein: 6.8 g/dL (ref 6.5–8.1)

## 2019-03-04 LAB — LIPID PANEL
Cholesterol: 115 mg/dL (ref 0–200)
HDL: 39 mg/dL — ABNORMAL LOW (ref >40)
LDL Cholesterol: 60 mg/dL (ref 0–99)
Total CHOL/HDL Ratio: 2.9 RATIO
Triglycerides: 81 mg/dL (ref <150)
VLDL: 16 mg/dL (ref 0–40)

## 2019-03-04 NOTE — Progress Notes (Signed)
   Subjective: Philip Vasquez is lying comfortably in bed in no distress. He has no needs or concerns.  He is excited for the [place] game today  Consults: na  Objective:  Vital signs in last 24 hours: Vitals:   03/03/19 2001 03/03/19 2319 03/04/19 0345 03/04/19 0736  BP: 135/72 131/77 (!) 138/92 (!) 123/101  Pulse: 77 72 64 64  Resp: 18 18 18 18   Temp: 98.4 F (36.9 C) 98.3 F (36.8 C) (!) 97.3 F (36.3 C) 98.6 F (37 C)  TempSrc: Oral Oral Oral Oral  SpO2: 100% 99% 100% 97%  Weight:      Height:        Physical Exam  Constitutional: No distress.  Cardiovascular: Normal rate, regular rhythm, normal heart sounds and intact distal pulses.  No murmur heard. Pulmonary/Chest: Effort normal. No respiratory distress. He exhibits no tenderness.  Abdominal: Soft. Bowel sounds are normal. He exhibits no distension. There is no abdominal tenderness.  Musculoskeletal: Normal range of motion.  Neurological: He is alert.  Skin: Skin is warm and dry. He is not diaphoretic. No erythema.  Psychiatric: Mood normal.  Nursing note and vitals reviewed.  I/Os:  Intake/Output Summary (Last 24 hours) at 03/04/2019 0938 Last data filed at 03/04/2019 0344 Gross per 24 hour  Intake 560 ml  Output 1000 ml  Net -440 ml   Labs: CBC Latest Ref Rng & Units 02/26/2019 02/21/2019 02/08/2019  WBC 4.0 - 10.5 K/uL 6.3 6.5 7.4  Hemoglobin 13.0 - 17.0 g/dL 9.8(L) 9.5(L) 8.8(L)  Hematocrit 39.0 - 52.0 % 30.8(L) 30.8(L) 27.9(L)  Platelets 150 - 400 K/uL 289 255 294    Assessment/Plan:  Assessment: Philip Vasquez is a 58 year old male with daily cocaine use and no previously recorded medical history who presented with altered mental status and right-sided weakness, found to have left ACA/MCA watershed infarcts now status post 3 weeks of dual antiplatelet therapy whose stay has been complicated by urinary and GI bleeding, CAUTI status post antibiotics and depression with improvement in mood on prozac and  remeron. Patient medically stable for discharge however disposition difficult given insurance status. Social work and case management are working to obtain appropriate posthospitalization disposition.  Plan: Principal Problem:   Cerebral thrombosis with cerebral infarction   Right sided weakness   Hypertension   Hyperlipidemia  -Status post 3 weeks of dual antiplatelet therapy -Continue aspirin and statin -Baclofen twice daily -Continue PT/OT/ST  Active Problems:   Urinary retention -urinating well -continue flomax 0.8 daily     Anemia of chronic disease/Acute blood loss anemia -CBC 11/20 with increasing hemoglobin of 9.8     Depression -Pt denies sadness today -Continue Prozac 60 mg daily  -Continue mirtazapine at current dose  Dispo: Anticipated discharge pending Medicaid approval and SNF placement.  Al Decant, MD 03/04/2019, 9:38 AM Pager: [phone]

## 2019-03-05 NOTE — TOC Progression Note (Signed)
Transition of Care [facility]) - Progression Note    Patient Details  Name: Leodis Alcocer MRN: [id number] Date of Birth: 1961-02-11  Transition of Care [facility]) CM/SW [place], LCSW Phone Number: 03/05/2019, 5:15 PM  Clinical Narrative:    No current bed offers. CSW will continue to follow for placement.    Expected Discharge Plan: IP Rehab Facility Barriers to Discharge: SNF Pending payor source - LOG, SNF Pending bed offer, Inadequate or no insurance, Active Substance Use - Placement  Expected Discharge Plan and Services Expected Discharge Plan: [place] In-house Referral: Clinical Social Work Discharge Planning Services: CM Consult                                           Social Determinants of Health (SDOH) Interventions    Readmission Risk Interventions No flowsheet data found.

## 2019-03-05 NOTE — Progress Notes (Signed)
Subjective: Philip Vasquez is lying comfortably in bed in no distress. He has no needs or concerns.  He denies pain and dysphoric mood. He is disappointed regarding the [employer] loss yesterday.   Consults: na  Objective:  Vital signs in last 24 hours: Vitals:   03/04/19 1946 03/04/19 2300 03/05/19 0305 03/05/19 0758  BP: (!) 141/81 104/78 104/77 (!) 148/96  Pulse: 80 70 70 71  Resp: 16 16 16 18   Temp: 97.7 F (36.5 C) 98.8 F (37.1 C) 98.7 F (37.1 C) 97.7 F (36.5 C)  TempSrc: Oral Oral Oral Oral  SpO2: 100% 98% 98% 99%  Weight:      Height:        Physical Exam  Constitutional: No distress.  Cardiovascular: Normal rate, regular rhythm, normal heart sounds and intact distal pulses.  No murmur heard. Pulmonary/Chest: Effort normal. No respiratory distress. He exhibits no tenderness.  Abdominal: Soft. Bowel sounds are normal. He exhibits no distension. There is no abdominal tenderness.  Musculoskeletal: Normal range of motion.  Neurological: He is alert.  Skin: Skin is warm and dry. He is not diaphoretic. No erythema.  Psychiatric: Mood normal.  Nursing note and vitals reviewed.  I/Os:  Intake/Output Summary (Last 24 hours) at 03/05/2019 1039 Last data filed at 03/05/2019 0900 Gross per 24 hour  Intake 600 ml  Output 1150 ml  Net -550 ml   Labs:  Cholesterol 0 - 200 mg/dL 03/07/2019  250    Triglycerides <150 mg/dL 81  84   HDL 037CWUG mg/dL >89   57   Total CHOL/HDL Ratio RATIO 2.9  4.8   VLDL 0 - 40 mg/dL 16  17   LDL Cholesterol 0 - 99 mg/dL 60  16XIH  CM     Ref Range & Units 1d ago (03/04/19) 4wk ago (02/04/19) 71mo ago (02/01/19) 61mo ago (01/31/19)  Sodium 135 - 145 mmol/L 140  142  143  143   Potassium 3.5 - 5.1 mmol/L 4.1  4.1  3.9  3.8   Chloride 98 - 111 mmol/L 106  105  111  111   CO2 22 - 32 mmol/L 26  26  24  24    Glucose, Bld 70 - 99 mg/dL 02/02/19     [id number]   113High    BUN 6 - 20 mg/dL 915AVWP   15  20  [id number]    Creatinine, Ser 0.61  - 1.24 mg/dL 1.32High   1.36High   1.33High   1.50High    Calcium 8.9 - 10.3 mg/dL 9.4  9.2  9.0  8.9   Total Protein 6.5 - 8.1 g/dL 6.8    55VZSM    Albumin 3.5 - 5.0 g/dL 27MBEM     7.5QGB    AST 15 - 41 U/L 25    42High    ALT 0 - 44 U/L 46High     88High    Alkaline Phosphatase 38 - 126 U/L 67    58   Total Bilirubin 0.3 - 1.2 mg/dL 0.5    0.3   GFR calc non Af Amer >60 mL/min 59Low   57Low   59Low   51Low    GFR calc Af Amer >60 mL/min >60  >60  >60  59Low    Anion gap 5 - 15 8  11  CM  8 CM  8 CM     Assessment/Plan:  Assessment: Philip Vasquez is a 58 year old male with daily cocaine use and no previously recorded medical  history who presented with altered mental status and right-sided weakness, found to have left ACA/MCA watershed infarcts now status post 3 weeks of dual antiplatelet therapy whose stay has been complicated by urinary and GI bleeding, CAUTI status post antibiotics and depression with improvement in mood on prozac and remeron. Patient medically stable for discharge however disposition difficult given insurance status. Social work and case management are working to obtain appropriate posthospitalization disposition.  Plan: Principal Problem:   Cerebral thrombosis with cerebral infarction   Right sided weakness   Hypertension   Hyperlipidemia  -Status post 3 weeks of dual antiplatelet therapy -Lipid panel yesterday with great improvement in overall cholesterol and LDL as above -CMP obtained yesterday due to previous increase in LFTs in setting of recently starting a statin but LFTs have decreased with normal AST and ALT of 46   Plan: -Continue aspirin and statin -Baclofen twice daily -Continue PT/OT/ST  Active Problems:   Urinary retention -urinating well -continue flomax 0.8 daily     Anemia of chronic disease/Acute blood loss anemia -CBC 11/20 with increasing hemoglobin of 9.8     Depression -Pt denies sadness today -Continue Prozac 60 mg daily   -Continue mirtazapine at current dose  Dispo: Anticipated discharge pending Medicaid approval and SNF placement.  Al Decant, MD 03/05/2019, 10:39 AM Pager: [phone]

## 2019-03-06 NOTE — Progress Notes (Signed)
   Subjective: Mr. Deman is lying comfortably in bed in no distress. He has no needs or concerns.  He denies pain and dysphoric mood. He says he will try and stand with his next therapy session.  Consults: na  Objective:  Vital signs in last 24 hours: Vitals:   03/05/19 2021 03/05/19 2342 03/06/19 0415 03/06/19 0814  BP: 116/75 102/78 130/83 (!) 128/95  Pulse: 86 66 70 73  Resp: 20 18 19 18   Temp: 99 F (37.2 C) 98 F (36.7 C) 98.4 F (36.9 C) 98.6 F (37 C)  TempSrc: Oral Oral Oral Oral  SpO2: 95% 95% 97% 100%  Weight:      Height:        Physical Exam  Constitutional: No distress.  Cardiovascular: Normal rate, regular rhythm, normal heart sounds and intact distal pulses.  No murmur heard. Pulmonary/Chest: Effort normal. No respiratory distress. He exhibits no tenderness.  Abdominal: Soft. Bowel sounds are normal. He exhibits no distension. There is no abdominal tenderness.  Musculoskeletal: Normal range of motion.  Neurological: He is alert.  Skin: Skin is warm and dry. He is not diaphoretic. No erythema.  Nursing note and vitals reviewed.  I/Os:  Intake/Output Summary (Last 24 hours) at 03/06/2019 1038 Last data filed at 03/06/2019 0900 Gross per 24 hour  Intake 600 ml  Output 1050 ml  Net -450 ml   Assessment/Plan:  Assessment: Mr. Pile is a 58 year old male with daily cocaine use and no previously recorded medical history who presented with altered mental status and right-sided weakness, found to have left ACA/MCA watershed infarcts now status post 3 weeks of dual antiplatelet therapy whose stay has been complicated by urinary and GI bleeding, CAUTI status post antibiotics and depression with improvement in mood on prozac and remeron. Patient medically stable for discharge however disposition difficult given insurance status. Social work and case management are working to obtain appropriate posthospitalization disposition.  Plan: Principal Problem:  Cerebral thrombosis with cerebral infarction   Right sided weakness   Hypertension   Hyperlipidemia  -Status post 3 weeks of dual antiplatelet therapy  Plan: -Continue aspirin and statin -Baclofen twice daily -Continue PT/OT/ST  Active Problems:   Urinary retention -urinating well -continue flomax 0.8 daily     Anemia of chronic disease/Acute blood loss anemia -CBC 11/20 with increasing hemoglobin of 9.8     Depression -Pt denies sadness today -Continue Prozac 60 mg daily  -Continue mirtazapine at current dose  Dispo: Anticipated discharge pending Medicaid approval and SNF placement.  Al Decant, MD 03/06/2019, 10:38 AM Pager: [phone]

## 2019-03-07 NOTE — Progress Notes (Signed)
   Subjective: Philip Vasquez is doing well this morning without any acute complaints. Urinating on his own without hematuria. Endorses good appetite. No issues with BMs.    Objective:  Vital signs in last 24 hours: Vitals:   03/06/19 1532 03/06/19 1938 03/06/19 2331 03/07/19 0350  BP: 120/78 120/81 115/79 123/86  Pulse: 73 80 70 72  Resp: 18 18 18 18   Temp: 99 F (37.2 C) 98.9 F (37.2 C) 98.6 F (37 C) 98.6 F (37 C)  TempSrc: Oral Oral Oral Oral  SpO2: 98% 97% 96% 95%  Weight:      Height:       General: awake, alert, resting comfortably in bed in NAD Abd: Soft, non-tender, non-distended Psych: normal mood; somewhat blunted affect   Assessment/Plan:  Principal Problem:   Cerebral thrombosis with cerebral infarction Active Problems:   Right sided weakness   Hypertension   Hyperlipidemia   Ulcer of penis   Urinary retention   Depression   Hematuria   Anemia of chronic disease   Acute blood loss anemia  Philip Vasquez is a 58 year old male with daily cocaine use and no previously recorded medical history who presented with altered mental status and right-sided weakness, found to have left ACA/MCA watershed infarcts now status post 3 weeks of dual antiplatelet therapy whose stay has been complicated by urinary and GI bleeding, CAUTI status post antibiotics and depression with improvement in mood on prozac and remeron. Patient medically stable for discharge however disposition difficult given insurance status. Social work and case management are working to obtain appropriate posthospitalization disposition.    Cerebral thrombosis with cerebral infarction   Right sided weakness   Hypertension   Hyperlipidemia -Status post 3 weeks of dual antiplatelet therapy -Continue aspirin and statin -Baclofen twice daily -Continue PT/OT/ST  Urinary retention -urinating well -continue flomax 0.8 daily     Anemia of chronic disease/Acute blood loss anemia -hgb has been stable for  over 2 weeks; will continue to check periodically; no further hematuria    Depression -seems to be improving  -Continue Prozac 60 mg daily  -Continue mirtazapine at current dose  Dispo: Anticipated discharge pending Medicaid approval and SNF placement.   Modena Nunnery D, DO 03/07/2019, 6:53 AM Pager: [phone]

## 2019-03-08 LAB — CBC
HCT: 33 % — ABNORMAL LOW (ref 39.0–52.0)
Hemoglobin: 10.4 g/dL — ABNORMAL LOW (ref 13.0–17.0)
MCH: 28 pg (ref 26.0–34.0)
MCHC: 31.5 g/dL (ref 30.0–36.0)
MCV: 88.9 fL (ref 80.0–100.0)
Platelets: 290 10*3/uL (ref 150–400)
RBC: 3.71 MIL/uL — ABNORMAL LOW (ref 4.22–5.81)
RDW: 13.4 % (ref 11.5–15.5)
WBC: 5.8 10*3/uL (ref 4.0–10.5)
nRBC: 0 % (ref 0.0–0.2)

## 2019-03-08 NOTE — Progress Notes (Signed)
Occupational Therapy Treatment Patient Details Name: Philip Vasquez MRN: [id number] DOB: December 30, 1960 Today's Date: 03/08/2019    History of present illness [name] is a 58 year old gentleman with no past medical history who presented with AMS and right-sided weakness. MRI: Widely scattered small acute infarcts in the medial lefthemisphere involving left ACA and left ACA/MCA watershed area.   OT comments  Pt received in bed, reluctant to work with OT/PT. Pt required modA+2 for bed mobility and modA+2 for sit<>stand from EOB with maxA+2 to maintain standing balance. He required totalA for posterior pericare. Pt continues to demonstrate cognitive limitations and physical limitations impacting his safety and independence with ADL/IADL and functional mobility. Pt will continue to benefit from skilled OT services to maximize safety and independence with ADL/IADL and functional mobility. Will continue to follow acutely and progress as tolerated.    Follow Up Recommendations  SNF;Supervision/Assistance - 24 hour    Equipment Recommendations  3 in 1 bedside commode    Recommendations for Other Services      Precautions / Restrictions Precautions Precautions: Fall Precaution Comments: R UE/LE weakness/tone Restrictions Weight Bearing Restrictions: No       Mobility Bed Mobility Overal bed mobility: Needs Assistance Bed Mobility: Sit to Sidelying;Supine to Sit;Rolling Rolling: Min assist   Supine to sit: Mod assist;+2 for physical assistance;HOB elevated   Sit to sidelying: Mod assist;+2 for physical assistance General bed mobility comments: modA+2 to progress trunk upright to sit EOB, pt able to progress hips to EOB;modA+2 for trunk and BLE management with return to supine   Transfers Overall transfer level: Needs assistance Equipment used: 2 person hand held assist Transfers: Sit to/from Stand Sit to Stand: Mod assist;+2 safety/equipment         General transfer comment: modA+2  to powerup into standing;maxA+2 to maintin standing balance    Balance Overall balance assessment: Needs assistance Sitting-balance support: Feet supported;Single extremity supported Sitting balance-Leahy Scale: Poor Sitting balance - Comments: intermittent no UE support, requires assist from therapist to stabilize Postural control: Posterior lean Standing balance support: Bilateral upper extremity supported Standing balance-Leahy Scale: Zero Standing balance comment: maxA+2 to maintain standing;physical assistance to progress trunk upright, worked on lateral weight shifting                           ADL either performed or assessed with clinical judgement   ADL Overall ADL's : Needs assistance/impaired                         Toilet Transfer: Maximal assistance;Moderate assistance Toilet Transfer Details (indicate cue type and reason): modA-maxA for sit<>stand, totalA for posterior pericare Toileting- Clothing Manipulation and Hygiene: Total assistance Toileting - Clothing Manipulation Details (indicate cue type and reason): condom cath on, totalA for posterior pericare     Functional mobility during ADLs: Maximal assistance;+2 for physical assistance;+2 for safety/equipment;Cueing for safety General ADL Comments: able to tolerate sitting EOB ~ 5 min, appeared fearful of falling, posterior lean when prompted to lean forward     Vision   Vision Assessment?: Vision impaired- to be further tested in functional context   Perception     Praxis      Cognition Arousal/Alertness: Awake/alert Behavior During Therapy: Flat affect Overall Cognitive Status: Impaired/Different from baseline Area of Impairment: Following commands;Safety/judgement;Awareness;Problem solving                       Following  Commands: Follows one step commands with increased time;Follows multi-step commands inconsistently Safety/Judgement: Decreased awareness of  safety;Decreased awareness of deficits Awareness: Intellectual Problem Solving: Difficulty sequencing;Requires verbal cues;Requires tactile cues;Slow processing General Comments: appeared to have fear of falling when sitting EOB;required max vc throughout for motor planning and sequencing;decreased awareness of deficits;pt with flat affect and required max encouragement to participate;pt oriented to place, month, year although when prompted if he lost track of time in hospital he answered 'yes';unsure validity of yes/no answers;        Exercises Other Exercises Other Exercises: Scapular mobilization x10 on R side while sitting EOB   Shoulder Instructions       General Comments vss    Pertinent Vitals/ Pain       Pain Assessment: No/denies pain Pain Intervention(s): Monitored during session  Home Living                                          Prior Functioning/Environment              Frequency  Min 2X/week        Progress Toward Goals  OT Goals(current goals can now be found in the care plan section)  Progress towards OT goals: Progressing toward goals  Acute Rehab OT Goals Patient Stated Goal: to get better OT Goal Formulation: With patient Time For Goal Achievement: 03/16/19 Potential to Achieve Goals: Fair ADL Goals Pt Will Perform Grooming: with set-up;sitting Pt Will Perform Upper Body Bathing: with set-up;sitting Pt Will Transfer to Toilet: stand pivot transfer;bedside commode;with mod assist Pt/caregiver will Perform Home Exercise Program: Increased ROM;Right Upper extremity;With written HEP provided Additional ADL Goal #1: Pt will be able to roll left and right with min A to help with basic ADLs Additional ADL Goal #2: Pt will be able to come up to sit EOB with min A in prep for transfers and EOB ADLs Additional ADL Goal #3: Pt will scan environment for appropriate items for functional tasks with minimal cueing.  Plan Discharge plan  remains appropriate    Co-evaluation    PT/OT/SLP Co-Evaluation/Treatment: Yes Reason for Co-Treatment: Complexity of the patient's impairments (multi-system involvement);For patient/therapist safety;To address functional/ADL transfers   OT goals addressed during session: ADL's and self-care      AM-PAC OT "6 Clicks" Daily Activity     Outcome Measure   Help from another person eating meals?: A Little Help from another person taking care of personal grooming?: A Lot Help from another person toileting, which includes using toliet, bedpan, or urinal?: Total Help from another person bathing (including washing, rinsing, drying)?: A Lot Help from another person to put on and taking off regular upper body clothing?: A Little Help from another person to put on and taking off regular lower body clothing?: A Lot 6 Click Score: 13    End of Session Equipment Utilized During Treatment: Gait belt;Rolling walker  OT Visit Diagnosis: Hemiplegia and hemiparesis;Other symptoms and signs involving cognitive function Symptoms and signs involving cognitive functions: Cerebral infarction Hemiplegia - Right/Left: Right Hemiplegia - dominant/non-dominant: Dominant Hemiplegia - caused by: Cerebral infarction   Activity Tolerance Patient tolerated treatment well   Patient Left in bed;with call bell/phone within reach;with bed alarm set;with restraints reapplied   Nurse Communication Mobility status        Time: [id number] OT Time Calculation (min): 23 min  Charges: OT General Charges $OT  Visit: 1 Visit OT Treatments $Self Care/Home Management : 8-22 mins  Diona Browner OTR/L Acute Rehabilitation Services Office: [phone]    Rebeca Alert 03/08/2019, 11:05 AM

## 2019-03-08 NOTE — TOC Progression Note (Signed)
Transition of Care [facility]) - Progression Note    Patient Details  Name: Philip Vasquez MRN: [id number] Date of Birth: Feb 05, 1961  Transition of Care [facility]) CM/SW [place], [place] Phone Number: 03/08/2019, 1:45 PM  Clinical Narrative:    No current bed offers. CSW will continue tofollow for placement.   Expected Discharge Plan: IP Rehab Facility Barriers to Discharge: SNF Pending payor source - LOG, SNF Pending bed offer, Inadequate or no insurance, Active Substance Use - Placement  Expected Discharge Plan and Services Expected Discharge Plan: [place] In-house Referral: Clinical Social Work Discharge Planning Services: CM Consult  Readmission Risk Interventions No flowsheet data found.

## 2019-03-08 NOTE — Progress Notes (Signed)
   Subjective: [name] is lying comfortably in bed in no distress. He has no needs or concerns.  He denies pain and dysphoric mood. He says he will try and stand with his next therapy session.  Consults: na  Objective:  Vital signs in last 24 hours: Vitals:   03/07/19 1558 03/07/19 1924 03/07/19 2326 03/08/19 0441  BP: 117/74 127/77 108/82 139/81  Pulse: 70 84 79 (!) 47  Resp: 18 18 18 16   Temp: 98.2 F (36.8 C) 97.7 F (36.5 C) 98.6 F (37 C) 98.7 F (37.1 C)  TempSrc: Oral Oral Oral Oral  SpO2: 97% 97% 95% 95%  Weight:      Height:        Physical Exam  Constitutional: No distress.  Cardiovascular: Normal rate, regular rhythm, normal heart sounds and intact distal pulses.  No murmur heard. Pulmonary/Chest: Effort normal. No respiratory distress. He exhibits no tenderness.  Abdominal: Soft. Bowel sounds are normal. He exhibits no distension. There is no abdominal tenderness.  Musculoskeletal: Normal range of motion.  Neurological: He is alert.  Skin: Skin is warm and dry. He is not diaphoretic. No erythema.  Nursing note and vitals reviewed.  I/Os:  Intake/Output Summary (Last 24 hours) at 03/08/2019 0809 Last data filed at 03/07/2019 2350 Gross per 24 hour  Intake 480 ml  Output -  Net 480 ml   Assessment/Plan:  Assessment: [name] is a 58 year old male with daily cocaine use and no previously recorded medical history who presented with altered mental status and right-sided weakness, found to have left ACA/MCA watershed infarcts now status post 3 weeks of dual antiplatelet therapy whose stay has been complicated by urinary and GI bleeding, CAUTI status post antibiotics and depression with improvement in mood on prozac and remeron. Patient medically stable for discharge however disposition difficult given insurance status. Social work and case management are working to obtain appropriate posthospitalization disposition.  Plan: Principal Problem:   Cerebral  thrombosis with cerebral infarction   Right sided weakness   Hypertension   Hyperlipidemia  -Status post 3 weeks of dual antiplatelet therapy  Plan: -Continue aspirin and statin -Baclofen twice daily -Continue PT/OT/ST  Active Problems:   Urinary retention -urinating well -continue flomax 0.8 daily     Anemia of chronic disease/Acute blood loss anemia -CBC 11/20 with increasing hemoglobin of 9.8     Depression -Pt denies sadness today -Continue Prozac 60 mg daily  -Continue mirtazapine at current dose  Dispo: Anticipated discharge pending Medicaid approval and SNF placement.  Al Decant, MD 03/08/2019, 8:09 AM Pager: [phone]

## 2019-03-08 NOTE — Progress Notes (Signed)
Physical Therapy Treatment Patient Details Name: Philip Vasquez MRN: [id number] DOB: Dec 29, 1960 Today's Date: 03/08/2019    History of Present Illness Philip Vasquez is a 58 year old gentleman with no past medical history who presented with AMS and right-sided weakness. MRI: Widely scattered small acute infarcts in the medial lefthemisphere involving left ACA and left ACA/MCA watershed area.    PT Comments    Patient participating with EOB and sit to stand activity with much encouragement and set boundaries for session.  Patient demonstrated R flexor withdrawal and R lateral pushing versus lateral lean.  Possibly due to decreased body/spatial awareness and decreased time upright.  Seems like he has limited expectations of himself and may benefit from Palliative Team evaluation for goals and management.  PT to follow acutely.  Continue to recommend SNF for rehab at d/c.    Follow Up Recommendations  SNF;Supervision/Assistance - 24 hour     Equipment Recommendations  Wheelchair (measurements PT);Wheelchair cushion (measurements PT);Hospital bed;Other (comment)    Recommendations for Other Services       Precautions / Restrictions Precautions Precautions: Fall Precaution Comments: R UE/LE weakness/tone    Mobility  Bed Mobility Overal bed mobility: Needs Assistance       Supine to sit: Mod assist;+2 for physical assistance;HOB elevated   Sit to sidelying: Mod assist;+2 for physical assistance General bed mobility comments: modA+2 to progress trunk upright to sit EOB, pt able to progress hips to EOB;modA+2 for trunk and BLE management with return to supine   Transfers Overall transfer level: Needs assistance Equipment used: 2 person hand held assist Transfers: Sit to/from Stand Sit to Stand: Mod assist;+2 safety/equipment         General transfer comment: modA+2 to powerup into standing;maxA+2 to maintin standing balance  Ambulation/Gait                 Stairs             Wheelchair Mobility    Modified Rankin (Stroke Patients Only) Modified Rankin (Stroke Patients Only) Pre-Morbid Rankin Score: Moderate disability Modified Rankin: Severe disability     Balance Overall balance assessment: Needs assistance Sitting-balance support: Feet supported;Single extremity supported Sitting balance-Leahy Scale: Poor Sitting balance - Comments: intermittent no UE support, requires assist from therapist to stabilize Postural control: Posterior lean Standing balance support: Bilateral upper extremity supported Standing balance-Leahy Scale: Zero Standing balance comment: maxA+2 to maintain standing;physical assistance to progress trunk upright, worked on lateral weight shifting                            Cognition Arousal/Alertness: Awake/alert Behavior During Therapy: Flat affect Overall Cognitive Status: Impaired/Different from baseline Area of Impairment: Following commands;Safety/judgement;Awareness;Problem solving                       Following Commands: Follows one step commands with increased time;Follows multi-step commands inconsistently Safety/Judgement: Decreased awareness of safety;Decreased awareness of deficits   Problem Solving: Difficulty sequencing;Requires verbal cues;Requires tactile cues;Slow processing General Comments: appeared to have fear of falling when sitting EOB;required max vc throughout for motor planning and sequencing;decreased awareness of deficits;pt with flat affect and required max encouragement to participate;pt oriented to place, month, year although when prompted if he lost track of time in hospital he answered 'yes';unsure validity of yes/no answers;      Exercises General Exercises - Lower Extremity Heel Slides: 5 reps;Supine;AROM;AAROM;Both Other Exercises Other Exercises: seated trunk rotation with lateral  stretch and manual mobilization of ribs/soft tissue    General Comments General  comments (skin integrity, edema, etc.): vss      Pertinent Vitals/Pain Pain Assessment: Faces Faces Pain Scale: Hurts little more Pain Location: RLE with movement Pain Descriptors / Indicators: Grimacing;Tightness Pain Intervention(s): Monitored during session;Repositioned    Home Living                      Prior Function            PT Goals (current goals can now be found in the care plan section) Progress towards PT goals: Progressing toward goals    Frequency    Min 1X/week      PT Plan Current plan remains appropriate    Co-evaluation PT/OT/SLP Co-Evaluation/Treatment: Yes Reason for Co-Treatment: Complexity of the patient's impairments (multi-system involvement);For patient/therapist safety;To address functional/ADL transfers          AM-PAC PT "6 Clicks" Mobility   Outcome Measure  Help needed turning from your back to your side while in a flat bed without using bedrails?: A Lot Help needed moving from lying on your back to sitting on the side of a flat bed without using bedrails?: Total Help needed moving to and from a bed to a chair (including a wheelchair)?: Total Help needed standing up from a chair using your arms (e.g., wheelchair or bedside chair)?: Total Help needed to walk in hospital room?: Total Help needed climbing 3-5 steps with a railing? : Total 6 Click Score: 7    End of Session   Activity Tolerance: Patient limited by fatigue;Other (comment)(self limited) Patient left: in bed;with call bell/phone within reach;with bed alarm set   PT Visit Diagnosis: Other abnormalities of gait and mobility (R26.89);Hemiplegia and hemiparesis;Muscle weakness (generalized) (M62.81) Hemiplegia - Right/Left: Right Hemiplegia - dominant/non-dominant: Dominant Hemiplegia - caused by: Cerebral infarction     Time: 7616-0737 PT Time Calculation (min) (ACUTE ONLY): 23 min  Charges:  $Therapeutic Activity: 8-22 mins                     Sheran Lawless, [facility] [phone] 03/08/2019    Elray Mcgregor 03/08/2019, 2:44 PM

## 2019-03-09 NOTE — Progress Notes (Signed)
   Subjective: Philip Vasquez is lying comfortably in bed in no distress. He has no needs or concerns.  He denies pain. He stood with PT/OT yesterday and says that he will try to walk with his next therapy session.   Consults: na  Objective:  Vital signs in last 24 hours: Vitals:   03/08/19 1928 03/08/19 2339 03/09/19 0336 03/09/19 0813  BP: 123/75 117/84 119/75 (!) 141/94  Pulse: 79 76 (!) 59 71  Resp: 17 16 16 15   Temp: (!) 97.5 F (36.4 C) 98.9 F (37.2 C) 98.4 F (36.9 C) 98.3 F (36.8 C)  TempSrc: Oral Oral Oral Oral  SpO2: 99% 94% 99% 98%  Weight:      Height:        Physical Exam  Constitutional: No distress.  Cardiovascular: Normal rate, regular rhythm, normal heart sounds and intact distal pulses.  No murmur heard. Pulmonary/Chest: Effort normal. No respiratory distress. He exhibits no tenderness.  Abdominal: Soft. Bowel sounds are normal. He exhibits no distension. There is no abdominal tenderness.  Musculoskeletal: Normal range of motion.  Neurological: He is alert.  Skin: Skin is warm and dry. He is not diaphoretic. No erythema.  Psychiatric:  Flat affect  Nursing note and vitals reviewed.  I/Os:  Intake/Output Summary (Last 24 hours) at 03/09/2019 1133 Last data filed at 03/09/2019 0630 Gross per 24 hour  Intake 240 ml  Output 600 ml  Net -360 ml   Assessment/Plan:  Assessment: [name] is a 58 year old male with daily cocaine use and no previously recorded medical history who presented with altered mental status and right-sided weakness, found to have left ACA/MCA watershed infarcts now status post 3 weeks of dual antiplatelet therapy whose stay has been complicated by urinary and GI bleeding, CAUTI status post antibiotics and depression with improvement in mood on prozac and remeron. Patient medically stable for discharge however disposition difficult given insurance status. Social work and case management are working to obtain appropriate  posthospitalization disposition.  Plan: Principal Problem:   Cerebral thrombosis with cerebral infarction   Right sided weakness   Hypertension   Hyperlipidemia  -Status post 3 weeks of dual antiplatelet therapy  Plan: -Continue aspirin and statin -Baclofen twice daily -Continue PT/OT/ST  Active Problems:   Urinary retention -urinating well -continue flomax 0.8 daily     Anemia of chronic disease/Acute blood loss anemia -CBC 11/20 with increasing hemoglobin of 9.8     Depression -Continue Prozac 60 mg daily  -Continue mirtazapine at current dose  Dispo: Anticipated discharge pending Medicaid approval and SNF placement.  Al Decant, MD 03/09/2019, 11:33 AM Pager: [phone]

## 2019-03-09 NOTE — Progress Notes (Signed)
Nutrition Follow-up   RD working remotely.  DOCUMENTATION CODES:   Obesity unspecified  INTERVENTION:  Continue Ensure Enlive po once daily, each supplement provides 350 kcal and 20 grams of protein.  Continue 30 ml Prostat po BID, each supplement provides 100 kcal and 15 grams of protein.   Encourage adequate PO intake.   NUTRITION DIAGNOSIS:   Inadequate oral intake related to lethargy/confusion as evidenced by estimated needs(reported meal completion); improved  GOAL:   Patient will meet greater than or equal to 90% of their needs; met  MONITOR:   PO intake, Supplement acceptance, Skin, Weight trends, Labs, I & O's  REASON FOR ASSESSMENT:   Malnutrition Screening Tool    ASSESSMENT:   58 year old man with hyperlipidemia, hypertension, and cocaine use disorder admitted with right upper and lower extremity weakness admitted with scattered acute infarcts in the medial left ACA and MCA watershed distribution. Discharge pending insurance approval and SNF bed.   Meal completion has been 50-100%. Pt currently has Ensure ordered with varied consumption. Pt additionally has Prostat ordered and pt has been taking them. RD to continue with current orders to aid in adequate nutrition.    Labs and medications reviewed.   Diet Order:   Diet Order            Diet regular Room service appropriate? Yes with Assist; Fluid consistency: Thin  Diet effective now              EDUCATION NEEDS:   Not appropriate for education at this time  Skin:  Skin Assessment: Reviewed RN Assessment Skin Integrity Issues:: Other (Comment) Other: N/A  Last BM:  12/1  Height:   Ht Readings from Last 1 Encounters:  12/19/18 '5\' 9"'$  (1.753 m)    Weight:   Wt Readings from Last 1 Encounters:  02/15/19 125 kg    Ideal Body Weight:  72.7 kg  BMI:  Body mass index is 40.7 kg/m.  Estimated Nutritional Needs:   Kcal:  2000-2200  Protein:  100-115 grams  Fluid:  >/= 2  L/day    Corrin Parker, MS, RD, LDN Pager # [phone] After hours/ weekend pager # [phone]

## 2019-03-10 NOTE — Progress Notes (Signed)
  Speech Language Pathology Treatment: Cognitive-Linquistic  Patient Details Name: Xabi Wittler MRN: [id number] DOB: 1961/02/17 Today's Date: 03/10/2019 Time: 6295-2841 SLP Time Calculation (min) (ACUTE ONLY): 11 min  Assessment / Plan / Recommendation Clinical Impression  Skilled treatment session focused on cognition. SLP provided letter cancellation task. Pt instructed to use pencil to cancel letter E and blue pen to cancel letter B. Task provided very difficult for pt as he used blue pen to cancel letters and he also canceled inappropriate letters. Throughout task, SLP attempted to provide awareness by asking Min A level question such as "what pen do you use for the letter E?" Cues were not helpful as pt didn't demonstrate any insight. SLP then changed task to canceling 1 specific letter. Pt with difficulty recalling specific letter and therefore continued to cross off wrong letters. SLP provided further cues, going letter by letter but this was not helpful with problem solving task. Pt left in bed, bed alarm on and all needs within reach.    HPI HPI: Pt is a 58 year old gentleman with no past medical history who presented with AMS and right-sided weakness. MRI: Widely scattered small acute infarcts in the medial lefthemisphere involving left ACA and left ACA/MCA watershed area. UA positive for cocaine use.       SLP Plan  Continue with current plan of care       Recommendations   TBD                Oral Care Recommendations: Oral care BID Follow up Recommendations: 24 hour supervision/assistance;Skilled Nursing facility SLP Visit Diagnosis: Cognitive communication deficit (L24.401) Plan: Continue with current plan of care       GO                Aleera Gilcrease 03/10/2019, 4:31 PM

## 2019-03-10 NOTE — Progress Notes (Signed)
   Subjective: [name] is lying comfortably in bed in no distress. He denies pain or issues with his mood. He says he will try and get up in the chair some.   Consults: na  Objective:  Vital signs in last 24 hours: Vitals:   03/09/19 1934 03/09/19 2319 03/10/19 0400 03/10/19 0753  BP: 129/78 120/85 (!) 146/91 133/88  Pulse: 73 66 63 62  Resp: 18 18 18 18   Temp: 98.4 F (36.9 C) 97.8 F (36.6 C) 97.8 F (36.6 C) 98.1 F (36.7 C)  TempSrc: Oral Oral Oral Oral  SpO2: 99% 98% 99% 100%  Weight:      Height:        Physical Exam  Constitutional: No distress.  Cardiovascular: Normal rate, regular rhythm, normal heart sounds and intact distal pulses.  No murmur heard. Pulmonary/Chest: Effort normal. No respiratory distress. He exhibits no tenderness.  Abdominal: Soft. Bowel sounds are normal. He exhibits no distension. There is no abdominal tenderness.  Musculoskeletal: Normal range of motion.  Neurological: He is alert.  Skin: Skin is warm and dry. He is not diaphoretic. No erythema.  Psychiatric:  Flat affect  Nursing note and vitals reviewed.  I/Os:  Intake/Output Summary (Last 24 hours) at 03/10/2019 1017 Last data filed at 03/10/2019 0900 Gross per 24 hour  Intake 705 ml  Output 903 ml  Net -198 ml   Assessment/Plan:  Assessment: Mr. Flott is a 58 year old male with daily cocaine use and no previously recorded medical history who presented with altered mental status and right-sided weakness, found to have left ACA/MCA watershed infarcts now status post 3 weeks of dual antiplatelet therapy whose stay has been complicated by urinary and GI bleeding, CAUTI status post antibiotics and depression with improvement in mood on prozac and remeron. Patient medically stable for discharge however disposition difficult given insurance status. Social work and case management are working to obtain appropriate posthospitalization disposition.  Plan: Principal Problem:   Cerebral  thrombosis with cerebral infarction   Right sided weakness   Hypertension   Hyperlipidemia  -Status post 3 weeks of dual antiplatelet therapy  Plan: -Continue aspirin and statin -Baclofen twice daily -Continue PT/OT/ST -up in chair for meals   Active Problems:   Urinary retention -urinating well -continue flomax 0.8 daily     Anemia of chronic disease/Acute blood loss anemia -CBC 11/20 with increasing hemoglobin of 9.8     Depression -Continue Prozac 60 mg daily  -Continue mirtazapine at current dose  Dispo: Anticipated discharge pending Medicaid approval and SNF placement.  Al Decant, MD 03/10/2019, 10:17 AM Pager: [phone]

## 2019-03-11 DIAGNOSIS — N5089 Other specified disorders of the male genital organs: Secondary | ICD-10-CM

## 2019-03-11 NOTE — Progress Notes (Addendum)
   Subjective: HD#82 Overnight, patient noted to have drainage of purulent material from area at bottom of scrotum that's not painful. Per RN, it was cleaned and cream applied. This morning, [name] was evaluated at bedside. He does not have any acute complaints this morning. He denies any scrotal pain or discharge.   Objective:  Vital signs in last 24 hours: Vitals:   03/10/19 1614 03/10/19 1917 03/11/19 0005 03/11/19 0300  BP: 122/88 123/76 125/77 137/80  Pulse: 76 69 66 62  Resp: 18 18 18 18   Temp: 98.4 F (36.9 C) 98.1 F (36.7 C) 98 F (36.7 C) 98.1 F (36.7 C)  TempSrc: Oral Oral Oral Oral  SpO2: 98% 98% 98% 99%  Weight:      Height:       Physical Exam Constitutional:      General: He is not in acute distress.    Appearance: Normal appearance. He is not ill-appearing.  Skin:    General: Skin is warm and dry.     Capillary Refill: Capillary refill takes less than 2 seconds.     Findings: No lesion.  Neurological:     Mental Status: He is alert. Mental status is at baseline.     Assessment/Plan:  [name] is a 58 year old male with history of daily cocaine use and no previously recorded medical history who presented with altered mental status and right-sided weakness, found to have left ACA/MCA watershed infarcts now s/p 3 weeks of dual antiplatelet therapy whose stay has been complicated by urinary and GI bleeding, CAUTI s/p antibiotics and depression with improvement in mood on prozac and remeron. Patient medically stable for discharge however disposition difficult given insurance status. Social work and case management are working to obtain appropriate posthospitalization disposition.  Cerebral thrombosis w/cerebral infarction:  Patient continues to have residual right sided weakness. He continues to work with PT and speech therapy.  - Continue aspirin and statin - Baclofen bid - Continue PT/OT/Speech therapy - Up in chair for meals  Scrotal lesion:  Per nursing note, patient noted to have scrotal lesion with purulent discharge which was cleaned and cream was applied. Patient is afebrile. He does not appear to be in any acute distress and denies any scrotal pain. On examination, no lesion appreciated.  - Continue to monitor   Depression:  Continue prozac 60mg  qd - Continue mirtazapine  Anemia of chronic disease:  CBC on 11/20 with Hb 9.8. No acute changes noted.  - Continue to monitor   Urinary retention:  - He is currently urinating without any issues - Continue flomax 0.8mg  qd   Dispo: Anticipated discharge pending Medicaid approval and SNF placement.   Harvie Heck, MD  Internal Medicine, PGY-1 03/11/2019, 6:15 AM Pager: [phone]

## 2019-03-11 NOTE — Progress Notes (Signed)
  Speech Language Pathology Treatment: Cognitive-Linquistic  Patient Details Name: Philip Vasquez MRN: [id number] DOB: 12-Jan-1961 Today's Date: 03/11/2019 Time: 9924-2683 SLP Time Calculation (min) (ACUTE ONLY): 11 min  Assessment / Plan / Recommendation Clinical Impression  Skilled treatment session focused on cognition goals. SLP facilitated session with similar activity from previous session. Pt continues to demonstrate willingness to work with therapy but presents with delayed processing so occasionally he appears disinterested. Pt unable to create ways to make letter cancellation task easier. SLP recommended pt write down the letter requested for cancellation, verbally repeat letter x 5 times and he created sentences using the letter (I need to remember to cross out the letter E, I am going to cross out the letter E, The letter E is the letter that I am looking for). Pt required Min A cues for use of these strategies and as a result of using these strategies, his accuracy increased over previous session to ~80%. Pt voiced proud feeling. Pt left in bed, bed alarm on and all needs within reach. Nursing made aware of session.    HPI HPI: Pt is a 58 year old gentleman with no past medical history who presented with AMS and right-sided weakness. MRI: Widely scattered small acute infarcts in the medial lefthemisphere involving left ACA and left ACA/MCA watershed area. UA positive for cocaine use.       SLP Plan  Continue with current plan of care       Recommendations   24 hour supervision                Oral Care Recommendations: Oral care BID Follow up Recommendations: 24 hour supervision/assistance;Skilled Nursing facility SLP Visit Diagnosis: Cognitive communication deficit (M19.622) Plan: Continue with current plan of care       GO                Jaicee Michelotti 03/11/2019, 9:12 AM

## 2019-03-11 NOTE — Progress Notes (Signed)
Pt had an area at the bottom of his scrotum that started to leak, pus-filled, tan. Pt stated did not hurt. Cleaned and cream applied.

## 2019-03-12 LAB — BASIC METABOLIC PANEL
Anion gap: 10 (ref 5–15)
BUN: 24 mg/dL — ABNORMAL HIGH (ref 6–20)
CO2: 23 mmol/L (ref 22–32)
Calcium: 9.6 mg/dL (ref 8.9–10.3)
Chloride: 105 mmol/L (ref 98–111)
Creatinine, Ser: 1.34 mg/dL — ABNORMAL HIGH (ref 0.61–1.24)
GFR calc Af Amer: >60 mL/min (ref >60)
GFR calc non Af Amer: 58 mL/min — ABNORMAL LOW (ref >60)
Glucose, Bld: 98 mg/dL (ref 70–99)
Potassium: 4.1 mmol/L (ref 3.5–5.1)
Sodium: 138 mmol/L (ref 135–145)

## 2019-03-12 LAB — CBC
HCT: 34.8 % — ABNORMAL LOW (ref 39.0–52.0)
Hemoglobin: 10.9 g/dL — ABNORMAL LOW (ref 13.0–17.0)
MCH: 27.7 pg (ref 26.0–34.0)
MCHC: 31.3 g/dL (ref 30.0–36.0)
MCV: 88.3 fL (ref 80.0–100.0)
Platelets: 259 10*3/uL (ref 150–400)
RBC: 3.94 MIL/uL — ABNORMAL LOW (ref 4.22–5.81)
RDW: 13.3 % (ref 11.5–15.5)
WBC: 5.6 10*3/uL (ref 4.0–10.5)
nRBC: 0 % (ref 0.0–0.2)

## 2019-03-12 NOTE — Progress Notes (Signed)
Occupational Therapy Treatment Patient Details Name: Philip Vasquez MRN: [id number] DOB: 1960-04-23 Today's Date: 03/12/2019    History of present illness Mr. Goshert is a 58 year old gentleman with no past medical history who presented with AMS and right-sided weakness. MRI: Widely scattered small acute infarcts in the medial lefthemisphere involving left ACA and left ACA/MCA watershed area.   OT comments  Pt. Seen for skilled OT treatment session. Pt. Able to complete grooming and self feeding tasks. Promoted scanning by having pt. Attend to tasks and objects on either side of the bed. Initially resistant to looking to the L but agreed and was able to.    Follow Up Recommendations  SNF;Supervision/Assistance - 24 hour    Equipment Recommendations  3 in 1 bedside commode    Recommendations for Other Services Rehab consult    Precautions / Restrictions Precautions Precautions: Fall Precaution Comments: R UE/LE weakness/tone       Mobility Bed Mobility                  Transfers                      Balance                                           ADL either performed or assessed with clinical judgement   ADL Overall ADL's : Needs assistance/impaired Eating/Feeding: Supervision/ safety;Minimal assistance;Bed level Eating/Feeding Details (indicate cue type and reason): pt. able to reach for and hold cup with B hands to bring to mouth for drinking orange juice Grooming: Wash/dry face;Brushing hair;Minimal assistance;Moderate assistance;Bed level Grooming Details (indicate cue type and reason): assistance for washing face for thoroughness.  pt. with notable frustration with inability to comb hair the way he wanted to and threw down the comb. encouraged him to try again.  he was wanting to comb hair forward and was attempting to detangle some of the curls in the front.  hand over hand assistance provided along with encouragement not to complete the  task                                     Vision       Perception     Praxis      Cognition Arousal/Alertness: Awake/alert Behavior During Therapy: Flat affect Overall Cognitive Status: Impaired/Different from baseline Area of Impairment: Following commands;Safety/judgement;Awareness;Problem solving                                        Exercises     Shoulder Instructions       General Comments      Pertinent Vitals/ Pain       Pain Assessment: Faces Pain Location: RLE with movement Pain Descriptors / Indicators: Grimacing  Home Living                                          Prior Functioning/Environment              Frequency  Min 2X/week        Progress Toward Goals  OT Goals(current goals can now be found in the care plan section)  Progress towards OT goals: Progressing toward goals     Plan Discharge plan remains appropriate    Co-evaluation                 AM-PAC OT "6 Clicks" Daily Activity     Outcome Measure   Help from another person eating meals?: A Little Help from another person taking care of personal grooming?: A Lot Help from another person toileting, which includes using toliet, bedpan, or urinal?: Total Help from another person bathing (including washing, rinsing, drying)?: A Lot Help from another person to put on and taking off regular upper body clothing?: A Little Help from another person to put on and taking off regular lower body clothing?: A Lot 6 Click Score: 13    End of Session    OT Visit Diagnosis: Hemiplegia and hemiparesis;Other symptoms and signs involving cognitive function Symptoms and signs involving cognitive functions: Cerebral infarction Hemiplegia - Right/Left: Right Hemiplegia - dominant/non-dominant: Dominant Hemiplegia - caused by: Cerebral infarction   Activity Tolerance Patient tolerated treatment well   Patient Left in bed;with call  bell/phone within reach;with bed alarm set   Nurse Communication          Time: 4818-5631 OT Time Calculation (min): 10 min  Charges: OT General Charges $OT Visit: 1 Visit OT Treatments $Self Care/Home Management : 8-22 mins   Robet Leu, COTA/L 03/12/2019, 11:01 AM

## 2019-03-12 NOTE — Progress Notes (Signed)
   Subjective: HD#83 Overnight, no acute events reported. This morning, Mr. Dnaiel Voller was assessed at bedside. He is sleeping comfortably in bed and does not appear to be in any acute distress.   Objective:  Vital signs in last 24 hours: Vitals:   03/11/19 1922 03/11/19 2218 03/11/19 2341 03/12/19 0339  BP: 133/85 (!) 133/97 121/78 138/86  Pulse: 73 66 63 (!) 58  Resp: 18  18 18   Temp: 97.7 F (36.5 C) 97.9 F (36.6 C) (!) 97.5 F (36.4 C) 98.1 F (36.7 C)  TempSrc: Oral Oral Oral Oral  SpO2: 97% 98% 95% 98%  Weight:      Height:       Physical Exam Constitutional:      General: He is not in acute distress.    Appearance: Normal appearance. He is not ill-appearing.  Cardiovascular:     Rate and Rhythm: Normal rate and regular rhythm.     Pulses: Normal pulses.     Heart sounds: Normal heart sounds.  Pulmonary:     Effort: Pulmonary effort is normal.     Breath sounds: Normal breath sounds.  Abdominal:     General: Bowel sounds are normal. There is no distension.     Palpations: Abdomen is soft.  Skin:    General: Skin is warm and dry.     Findings: No lesion.  Neurological:     Mental Status: Mental status is at baseline.     Assessment/Plan:  Mr. Wiechmann is a 58 year old male with history of daily cocaine use and no previously recorded medical history who presented with altered mental status and right-sided weakness, found to have left ACA/MCA watershed infarcts now s/p 3 weeks of dual antiplatelet therapy whose stay has been complicated by urinary and GI bleeding, CAUTI s/p antibiotics and depression with improvement in mood on prozac and remeron. Patient medically stable for discharge however disposition difficult given insurance status. Social work and case management are working to obtain appropriate posthospitalization disposition.  Cerebral thrombosis w/cerebral infarction:  Patient continues to have residual right sided weakness. He continues to work with  PT and speech therapy.  - Continue aspirin and statin - Baclofen bid - Continue PT/OT/Speech therapy - Up in chair for meals  Depression:  - Continue prozac 60mg  qd - Continue mirtazapine  Anemia of chronic disease:  CBC on 12/4 with Hb 10.4, MCV 88. No acute changes noted.  - Continue to monitor with weekly labs  Urinary retention:  - He is currently urinating without any issues - Continue flomax 0.8mg  qd   Dispo: Anticipated discharge pending Medicaid approval and SNF placement.   Harvie Heck, MD  Internal Medicine, PGY-1 03/12/2019, 6:31 AM Pager: [phone]

## 2019-03-13 NOTE — Progress Notes (Addendum)
   Subjective: Patient seen at the bedside on rounds this morning.  Upon entering the room, the patient had the bed sheets pulled over his head. When he woke up, the patient was appropriately answering questions.  States that he has no complaints at this time and feels well.  Objective:  Vital signs in last 24 hours: Vitals:   03/12/19 1558 03/12/19 2043 03/13/19 0000 03/13/19 0402  BP: 112/85 132/82 122/79 118/87  Pulse: 73 71 68 63  Resp: 18 18 18 18   Temp: 99.1 F (37.3 C) 98.1 F (36.7 C) 98.9 F (37.2 C) 98.3 F (36.8 C)  TempSrc: Oral Oral Oral Oral  SpO2: 98% 98% 97% 99%  Weight:      Height:       Physical Exam General: Comfortable, laying in bed HEENT: NCAT, EMOI CV: RRR, normal S1-S2 no murmurs rubs or gallops appreciated PULM: Clear to auscultation bilaterally, no crackles or wheezing ABD: Bowel sounds present, nontender in all quadrants Neuro: Alert and oriented x3, RUE & RLE weakness  Assessment/Plan:  Principal Problem:   Cerebral thrombosis with cerebral infarction Active Problems:   Right sided weakness   Hypertension   Hyperlipidemia   Ulcer of penis   Urinary retention   Depression   Hematuria   Anemia of chronic disease   Acute blood loss anemia  In summary, Philip Vasquez is a 58 year old male with past medical history significant for daily cocaine use who presented with L ACA/MCA watershed infarcts.  His course was complicated by urinary and GI bleeding,CAUTI s/p antibiotics and depression with improvement in mood on Prozac and Remeron.  Patient is medically stable for discharge, however his disposition is uncertain given his uninsured status.  Social work and case management are working on obtaining posthospitalization disposition.  #L ACA/MCA watershed infarct: Pt's deficits are R sided upper and lower extremities.  -Aspirin 81 mg daily -Rosuvastatin 40 mg daily -Baclofen 5 mg twice daily for spasticity  #HTN -Metoprolol heart rate 25 mg daily   -Amlodipine 10 mg daily -Hydralazine 75 mg 3 times daily -Losartan 50 mg daily  #Depression -Prozac 60 mg daily -Mirtazapine 75 mg daily  #Urinary retention -Flomax 0.8 mg daily  #FEN/GI -Diet: Regula, plus feeding supplements -Fluids: None  #DVT Prophylaxis -Lovenox 40 mg subq injections daily  #CODE STATUS: FULL  #Dispo: Anticipated discharge pending Medicaid approval and SNF placement  [name], MD Internal Medicine, PGY1 Pager: [phone]  03/13/2019,7:23 AM

## 2019-03-14 NOTE — Progress Notes (Signed)
   Subjective: Patient seen at the bedside this morning on rounds.  He was sitting up comfortably while eating breakfast.  No complaints at this time.  Objective:  Vital signs in last 24 hours: Vitals:   03/13/19 1931 03/13/19 2336 03/14/19 0345 03/14/19 0803  BP: 134/90 119/76 115/84 122/88  Pulse: 72 69 65 71  Resp: 19 19 17 18   Temp: 98.8 F (37.1 C) 98.7 F (37.1 C) 98 F (36.7 C) 98.6 F (37 C)  TempSrc: Oral Oral Oral Oral  SpO2: 97% 98% 97% 100%  Weight:   136 kg   Height:       Physical Exam General: Comfortable and well appearing HEENT: NCAT CV: Warm and well-perfused PULM: Normal work of breathing NEURO: Right upper and lower extremity weakness  Assessment/Plan:  Principal Problem:   Cerebral thrombosis with cerebral infarction Active Problems:   Right sided weakness   Hypertension   Hyperlipidemia   Ulcer of penis   Urinary retention   Depression   Hematuria   Anemia of chronic disease   Acute blood loss anemia  In summary, Mr. Oestreich is a 58 year old male with past medical history significant for daily cocaine use who presented with L ACA/MCA watershed infarcts.  His course was complicated by urinary and GI bleeding,CAUTI s/p antibiotics and depression with improvement in mood on Prozac and Remeron.  Patient is medically stable for discharge, however his disposition is uncertain given his uninsured status.  Social work and case management are working on obtaining posthospitalization disposition.  #L ACA/MCA watershed infarct: Pt's deficits are R sided upper and lower extremities.  Continues to be stable -Aspirin 81 mg daily -Rosuvastatin 40 mg daily -Baclofen 5 mg twice daily for spasticity  #HTN -Metoprolol heart rate 25 mg daily  -Amlodipine 10 mg daily -Hydralazine 75 mg 3 times daily -Losartan 50 mg daily  #Depression -Prozac 60 mg daily -Mirtazapine 75 mg daily  #Urinary retention -Flomax 0.8 mg daily  #FEN/GI -Diet: Regula, plus  feeding supplements -Fluids: None  #DVT Prophylaxis -Lovenox 40 mg subq injections daily  #CODE STATUS: FULL  #Dispo: Anticipated discharge pending Medicaid approval and SNF placement  [name], MD Internal Medicine, PGY1 Pager: [phone]  03/14/2019,10:27 AM

## 2019-03-15 NOTE — TOC Progression Note (Signed)
Transition of Care [facility]) - Progression Note    Patient Details  Name: Philip Vasquez MRN: [id number] Date of Birth: 02/13/1961  [facility] CM/SW Contact  Pollie Friar, RN Phone Number: 03/15/2019, 1:37 PM  Clinical Narrative:    Continues to have medicaid pending and no bed offers. TOC following.   Expected Discharge Plan: IP Rehab Facility Barriers to Discharge: SNF Pending payor source - LOG, SNF Pending bed offer, Inadequate or no insurance, Active Substance Use - Placement  Expected Discharge Plan and Services Expected Discharge Plan: [facility] In-house Referral: Clinical Social Work Discharge Planning Services: CM Consult                                           Social Determinants of Health (SDOH) Interventions    Readmission Risk Interventions No flowsheet data found.

## 2019-03-15 NOTE — Progress Notes (Signed)
   Subjective: Philip Vasquez is lying comfortably in bed in no distress. He denies pain depressed mood. He is looking forward to the [employer]. He has no complaints or concerns.   Consults: na  Objective:  Vital signs in last 24 hours: Vitals:   03/14/19 1644 03/14/19 1941 03/14/19 2331 03/15/19 0312  BP: (!) 138/93 137/86 109/68 (!) 139/93  Pulse: 73 77 64 63  Resp: 17 18 18 16   Temp: 98.1 F (36.7 C) 98.4 F (36.9 C) 97.7 F (36.5 C) 97.7 F (36.5 C)  TempSrc: Oral Oral Oral Oral  SpO2: 100% 98% 97% 100%  Weight:      Height:        Physical Exam  Constitutional: No distress.  Cardiovascular: Normal rate, regular rhythm, normal heart sounds and intact distal pulses.  No murmur heard. Pulmonary/Chest: Effort normal. No respiratory distress. He exhibits no tenderness.  Abdominal: Soft. Bowel sounds are normal. He exhibits no distension. There is no abdominal tenderness.  Musculoskeletal: Normal range of motion.  Neurological: He is alert.  Skin: Skin is warm and dry. He is not diaphoretic. No erythema.  Psychiatric: Affect normal.  Flat affect  Nursing note and vitals reviewed.  I/Os:  Intake/Output Summary (Last 24 hours) at 03/15/2019 0557 Last data filed at 03/14/2019 1644 Gross per 24 hour  Intake 240 ml  Output 1225 ml  Net -985 ml   Assessment/Plan:  Assessment: Philip Vasquez is a 58 year old male with daily cocaine use and no previously recorded medical history who presented with altered mental status and right-sided weakness, found to have left ACA/MCA watershed infarcts now status post 3 weeks of dual antiplatelet therapy whose stay has been complicated by urinary and GI bleeding, CAUTI status post antibiotics and depression with improvement in mood on prozac and remeron. Patient medically stable for discharge however disposition difficult given insurance status. Social work and case management are working to obtain appropriate posthospitalization  disposition.  Plan: Principal Problem:   Cerebral thrombosis with cerebral infarction   Right sided weakness   Hypertension   Hyperlipidemia  -Status post 3 weeks of dual antiplatelet therapy  Plan: -Continue aspirin and statin -Baclofen twice daily -Continue PT/OT/ST -up in chair for meals   Active Problems:   Urinary retention -urinating well -continue flomax 0.8 daily     Anemia of chronic disease/Acute blood loss anemia -Hgb continuing to increase; 10.9 on 03/12/2019    Depression -Continue Prozac 60 mg daily  -Continue mirtazapine at current dose  Dispo: Anticipated discharge pending Medicaid approval and SNF placement.  Al Decant, MD 03/15/2019, 5:57 AM Pager: [phone]

## 2019-03-15 NOTE — Progress Notes (Signed)
Physical Therapy Treatment Patient Details Name: Philip Vasquez MRN: [id number] DOB: December 03, 1960 Today's Date: 03/15/2019    History of Present Illness Philip Vasquez is a 58 year old gentleman with no past medical history who presented with AMS and right-sided weakness. MRI: Widely scattered small acute infarcts in the medial lefthemisphere involving left ACA and left ACA/MCA watershed area.    PT Comments    Pt performed gt training short steps from bed to recliner chair placed 1ft away from bed.  He presents with posterior bias in standing with hips flexed.  Pt more motivated this session and following commands to move from bed to recliner with the use of RW.  Pt fatigues very quickly and noted wuith DOE after transfer to recliner chair.  Based on continued need for external assistance and decreased activity tolerance will continue to recommend SNF placement at time of d/c he is making slow functional gains to date.     Follow Up Recommendations  SNF;Supervision/Assistance - 24 hour     Equipment Recommendations  Wheelchair (measurements PT);Wheelchair cushion (measurements PT);Hospital bed;Other (comment)    Recommendations for Other Services Rehab consult     Precautions / Restrictions Precautions Precautions: Fall Precaution Comments: R UE/LE weakness/tone Restrictions Weight Bearing Restrictions: No    Mobility  Bed Mobility Overal bed mobility: Needs Assistance Bed Mobility: Supine to Sit     Supine to sit: Mod assist;HOB elevated     General bed mobility comments: Pt required assistance to advance LEs to edge of bed and to elevate trunk into a seated position.  Pt required cues for upper trunk control and maintaining midline once in sitting. Able to sit unsupported edge of bed once feet were grounded with heavy BUE support.  Transfers Overall transfer level: Needs assistance Equipment used: Rolling walker (2 wheeled) Transfers: Sit to/from Stand Sit to Stand: Mod  assist(with heavy posterior lean.) Stand pivot transfers: Max assist       General transfer comment: Mod assistance to power up into standing.  Pt bracing back of legs against the bed requiring cues and facilitation for forward weight shifting.  Pt able to follow commands to progress to chair from bed.  Poor eccentric load noted from bed to recliner but he did reach back for chair arm.  Ambulation/Gait Ambulation/Gait assistance: Max assist Gait Distance (Feet): 4 Feet(steps with RW from bed to recliner chair, able to turn and back to seated surface.) Assistive device: Rolling walker (2 wheeled) Gait Pattern/deviations: Step-to pattern;Trunk flexed;Ataxic     General Gait Details: Pt with good participation noted in sequencing from bed to recliner.  Pt required cues for upper trunk control, sequencing, turning and backing to seated surface.   Stairs             Wheelchair Mobility    Modified Rankin (Stroke Patients Only) Modified Rankin (Stroke Patients Only) Pre-Morbid Rankin Score: Moderate disability Modified Rankin: Moderately severe disability     Balance Overall balance assessment: Needs assistance Sitting-balance support: Feet supported;Single extremity supported Sitting balance-Leahy Scale: Poor Sitting balance - Comments: intermittent no UE support, requires assist from therapist to stabilize     Standing balance-Leahy Scale: Poor Standing balance comment: mod to max with heavy UE use.                            Cognition Arousal/Alertness: Awake/alert Behavior During Therapy: Flat affect Overall Cognitive Status: Impaired/Different from baseline Area of Impairment: Following commands;Safety/judgement;Awareness;Problem solving  Following Commands: Follows one step commands with increased time;Follows multi-step commands inconsistently Safety/Judgement: Decreased awareness of safety;Decreased awareness of  deficits Awareness: Intellectual Problem Solving: Difficulty sequencing;Requires verbal cues;Requires tactile cues;Slow processing General Comments: Pt more receptive to participating in session.  He was able to progress to short bout of gt training.      Exercises      General Comments        Pertinent Vitals/Pain Pain Assessment: Faces Faces Pain Scale: Hurts little more Pain Location: RLE with movement Pain Descriptors / Indicators: Grimacing Pain Intervention(s): Monitored during session;Repositioned    Home Living                      Prior Function            PT Goals (current goals can now be found in the care plan section) Acute Rehab PT Goals Patient Stated Goal: to get better Potential to Achieve Goals: Fair Progress towards PT goals: Progressing toward goals    Frequency    Min 1X/week      PT Plan Current plan remains appropriate    Co-evaluation              AM-PAC PT "6 Clicks" Mobility   Outcome Measure  Help needed turning from your back to your side while in a flat bed without using bedrails?: A Lot Help needed moving from lying on your back to sitting on the side of a flat bed without using bedrails?: A Lot Help needed moving to and from a bed to a chair (including a wheelchair)?: A Lot Help needed standing up from a chair using your arms (e.g., wheelchair or bedside chair)?: A Lot Help needed to walk in hospital room?: Total Help needed climbing 3-5 steps with a railing? : Total 6 Click Score: 10    End of Session Equipment Utilized During Treatment: Gait belt Activity Tolerance: Patient tolerated treatment well;Patient limited by fatigue Patient left: in chair;with call bell/phone within reach;with chair alarm set Nurse Communication: Mobility status PT Visit Diagnosis: Other abnormalities of gait and mobility (R26.89);Hemiplegia and hemiparesis;Muscle weakness (generalized) (M62.81) Hemiplegia - Right/Left:  Right Hemiplegia - dominant/non-dominant: Dominant Hemiplegia - caused by: Cerebral infarction     Time: 1517-6160 PT Time Calculation (min) (ACUTE ONLY): 16 min  Charges:  $Therapeutic Activity: 8-22 mins                     Philip Vasquez , PTA Acute Rehabilitation Services Pager [phone] Office [phone]     Yoneko Talerico [name] 03/15/2019, 5:49 PM

## 2019-03-16 NOTE — Plan of Care (Signed)
Patient helped with breakfast set up. Patient able to feed self and currently is consuming breakfast without and difficulties. 75% of meal completed at this time

## 2019-03-16 NOTE — Progress Notes (Signed)
Nutrition Follow-up  DOCUMENTATION CODES:   Obesity unspecified  INTERVENTION:  Continue Ensure Enlive po once daily, each supplement provides 350 kcal and 20 grams of protein.  Continue 30 ml Prostat po BID, each supplement provides 100 kcal and 15 grams of protein.   Encourage adequate PO intake.   NUTRITION DIAGNOSIS:   Inadequate oral intake related to lethargy/confusion as evidenced by estimated needs(reported meal completion); ongoing  GOAL:   Patient will meet greater than or equal to 90% of their needs; met  MONITOR:   PO intake, Supplement acceptance, Skin, Weight trends, Labs, I & O's  REASON FOR ASSESSMENT:   Malnutrition Screening Tool    ASSESSMENT:   58 year old man with hyperlipidemia, hypertension, and cocaine use disorder admitted with right upper and lower extremity weakness admitted with scattered acute infarcts in the medial left ACA and MCA watershed distribution. Pt medically stable for discharge, however difficult disposition given insurance status. Social work and case management are working to obtain appropriate post hospitalization disposition.  Meal completion has been 75-100%. Per RN, pt tolerating diet with no difficulties. Pt currently has Ensure ordered with varied consumption. Pt additionally with Prostat ordered and has been consuming them. RD to continue with current orders to aid in caloric and protein needs.   Labs and medications reviewed.   Diet Order:   Diet Order            Diet regular Room service appropriate? Yes with Assist; Fluid consistency: Thin  Diet effective now              EDUCATION NEEDS:   Not appropriate for education at this time  Skin:  Skin Assessment: Reviewed RN Assessment Skin Integrity Issues:: Other (Comment) Other: N/A  Last BM:  12/7  Height:   Ht Readings from Last 1 Encounters:  12/19/18 5' 9" (1.753 m)    Weight:   Wt Readings from Last 1 Encounters:  03/16/19 135 kg    Ideal Body  Weight:  72.7 kg  BMI:  Body mass index is 43.95 kg/m.  Estimated Nutritional Needs:   Kcal:  2000-2200  Protein:  100-115 grams  Fluid:  >/= 2 L/day    Stephanie Craig, MS, RD, LDN Pager # [phone] After hours/ weekend pager # [phone]  

## 2019-03-16 NOTE — Progress Notes (Signed)
Patient has an area at the bottom of his scrotum that was leaking a small amount of clear fluid. The patient denies any pain at this time. Area cleaned and left opened to air.

## 2019-03-16 NOTE — Progress Notes (Addendum)
Spoke in length w/patient's mom. SHe is unable to visit due to her own limitations. She had questions about allowing the brother of the pt to return. I explained that the safety of the pt, the staff and other patients were of importance. SHe was concerned about pt needing a shave as well as the MD assessing his feet. I shared that I would communicate with the nursing team. WE would ask the pt if he wanted to be shaved and also ensure the MD did an assessment of his feet. Offered face time with smart phones or use of the IPAD. She declined at this time. ---Lora Havens - mom [phone]

## 2019-03-16 NOTE — Progress Notes (Signed)
This Probation officer was informed that the patient's mom called with concerns about the patient's feet and needing to be shaved. This Probation officer applied the Eucerin cream as ordered to the patient's feet. The feet are still dry but are showing significant improvement. Also this Probation officer asked patient if he would like help shaving his facial hair, the patient stated "yes, I would like my face cleaned up a little". The nurse tech assisted the patient in shaving his face per patient's requested. After shaved this writer asked patient if wanted his mom to be notified of the care performed. Patient stated "no, I'm fine". Will continue to monitor.

## 2019-03-16 NOTE — Progress Notes (Signed)
   Subjective: [name] is lying comfortably in bed in no distress. He denies pain and depressed mood. He is looking forward to the [employer] He has no complaints or concerns.   Consults: na  Objective:  Vital signs in last 24 hours: Vitals:   03/16/19 0012 03/16/19 0342 03/16/19 0412 03/16/19 0923  BP: 106/74  113/84 124/80  Pulse: 66  61 69  Resp: 18  20 16   Temp: 97.8 F (36.6 C)  98 F (36.7 C) 97.8 F (36.6 C)  TempSrc: Oral  Oral Oral  SpO2: 98%  100% 98%  Weight:  135 kg    Height:        Physical Exam  Constitutional: No distress.  Cardiovascular: Normal rate, regular rhythm, normal heart sounds and intact distal pulses.  No murmur heard. Pulmonary/Chest: Effort normal. No respiratory distress. He exhibits no tenderness.  Abdominal: Soft. Bowel sounds are normal. He exhibits no distension. There is no abdominal tenderness.  Musculoskeletal: Normal range of motion.  Neurological: He is alert.  Skin: Skin is warm and dry. He is not diaphoretic. No erythema.  Psychiatric: Affect normal.  Nursing note and vitals reviewed.  I/Os:  Intake/Output Summary (Last 24 hours) at 03/16/2019 1106 Last data filed at 03/16/2019 0342 Gross per 24 hour  Intake 245 ml  Output 1050 ml  Net -805 ml   Assessment/Plan:  Assessment: [name] is a 58 year old male with daily cocaine use and no previously recorded medical history who presented with altered mental status and right-sided weakness, found to have left ACA/MCA watershed infarcts now status post 3 weeks of dual antiplatelet therapy whose stay has been complicated by urinary and GI bleeding, CAUTI status post antibiotics and depression with improvement in mood on prozac and remeron. Patient medically stable for discharge however disposition difficult given insurance status. Social work and case management are working to obtain appropriate posthospitalization disposition.  Plan: Principal Problem:  Cerebral thrombosis with cerebral infarction   Right sided weakness   Hypertension   Hyperlipidemia  -Status post 3 weeks of dual antiplatelet therapy  Plan: -Continue aspirin and statin -Baclofen twice daily -Continue PT/OT/ST -up in chair for meals   Active Problems:   Urinary retention -urinating well -continue flomax 0.8 daily     Anemia of chronic disease/Acute blood loss anemia -Hgb continuing to increase; 10.9 on 03/12/2019    Depression -Continue Prozac 60 mg daily  -Continue mirtazapine at current dose  Dispo: Anticipated discharge pending Medicaid approval and SNF placement.  Philip Decant, MD 03/16/2019, 11:06 AM Pager: [phone]

## 2019-03-17 NOTE — Plan of Care (Signed)
Patient reporting a 0 on the pain scale from 0 to 10. No pain or discomfort reported at this time. Will continue o monitor.

## 2019-03-17 NOTE — Progress Notes (Signed)
   Subjective: Philip Vasquez is working with OT on exam today. We discussed the importance of trying to stand up with therapy. OT said he was improving. He reports he got a shave yesterday and some lotion for his feet. He is disappointed in the [place] loss. I asked him about the area of drainage from his scrotum and he said it was not causing him any pain or discomfort. He had no other complaints or concerns.  Consults: na  Objective:  Vital signs in last 24 hours: Vitals:   03/17/19 0103 03/17/19 0639 03/17/19 0747 03/17/19 1035  BP: 122/75 131/86 (!) 133/95 128/86  Pulse: 63 63 61 67  Resp: 20 20 16    Temp: 97.8 F (36.6 C) 98.5 F (36.9 C) 98.2 F (36.8 C)   TempSrc: Oral Oral Oral   SpO2: 99% 98% 100%   Weight:      Height:        Physical Exam  Constitutional: No distress.  Cardiovascular: Normal rate, regular rhythm, normal heart sounds and intact distal pulses.  No murmur heard. Pulmonary/Chest: Effort normal. No respiratory distress. He exhibits no tenderness.  Abdominal: Soft. Bowel sounds are normal. He exhibits no distension. There is no abdominal tenderness.  Musculoskeletal: Normal range of motion.  Neurological: He is alert.  Skin: Skin is warm and dry. He is not diaphoretic. No erythema.  Nursing note and vitals reviewed.  I/Os:  Intake/Output Summary (Last 24 hours) at 03/17/2019 1137 Last data filed at 03/17/2019 0600 Gross per 24 hour  Intake -  Output 600 ml  Net -600 ml   Assessment/Plan:  Assessment: Philip Vasquez is a 58 year old male with daily cocaine use and no previously recorded medical history who presented with altered mental status and right-sided weakness, found to have left ACA/MCA watershed infarcts now status post 3 weeks of dual antiplatelet therapy whose stay has been complicated by urinary and GI bleeding, CAUTI status post antibiotics and depression with improvement in mood on prozac and remeron. Patient medically stable for discharge  however disposition difficult given insurance status. Social work and case management are working to obtain appropriate posthospitalization disposition.  Plan: Principal Problem:   Cerebral thrombosis with cerebral infarction   Right sided weakness   Hypertension   Hyperlipidemia  -Status post 3 weeks of dual antiplatelet therapy  Plan: -Continue aspirin and statin -Baclofen twice daily -Continue PT/OT/ST -up in chair for meals   Active Problems:   Urinary retention -urinating well -continue flomax 0.8 daily     Anemia of chronic disease/Acute blood loss anemia -Hgb continuing to increase; 10.9 on 03/12/2019    Depression -Continue Prozac 60 mg daily  -Continue mirtazapine at current dose  Dispo: Anticipated discharge pending Medicaid approval and SNF placement.  Al Decant, MD 03/17/2019, 11:37 AM Pager: [phone]

## 2019-03-17 NOTE — Progress Notes (Signed)
Occupational Therapy Treatment Patient Details Name: Philip Vasquez MRN: [id number] DOB: 10/09/1960 Today's Date: 03/17/2019    History of present illness [name] is a 58 year old gentleman with no past medical history who presented with AMS and right-sided weakness. MRI: Widely scattered small acute infarcts in the medial lefthemisphere involving left ACA and left ACA/MCA watershed area.   OT comments  Upon arrival, pt initially declining therapy this date. When given a time frame to work with therapy and max encouragement, pt more participatory. Pt resisted sit<>stand but agreeable to session at EOB. Pt progress to EOB with modA. He demonstrates improvement in bilateral coordination and initiating use of RUE during ADL. Pt able to squeeze lotion with right hand onto legs/in left hand with increased effort. He rubbed lotion on legs use bilateral UE with improved coordination. Pt initially required minA-modA for stability sitting EOB, after about 5 min required maxA and demonstrated significant posterior lean. Pt fatigues quickly. Pt will continue to benefit from skilled OT services to maximize safety and independence with ADL/IADL and functional mobility. Will continue to follow acutely and progress as tolerated.    Follow Up Recommendations  SNF;Supervision/Assistance - 24 hour    Equipment Recommendations  3 in 1 bedside commode    Recommendations for Other Services Rehab consult    Precautions / Restrictions Precautions Precautions: Fall Precaution Comments: R UE/LE weakness/tone Restrictions Weight Bearing Restrictions: No       Mobility Bed Mobility Overal bed mobility: Needs Assistance Bed Mobility: Supine to Sit;Sit to Supine;Rolling Rolling: Min assist   Supine to sit: Mod assist;HOB elevated Sit to supine: Mod assist;+2 for safety/equipment;+2 for physical assistance   General bed mobility comments: minA to roll to the left;modA to progress trunk upright to sitting  EOB;modA to return to supine;pt with significant posterior lean  Transfers                 General transfer comment: deferred    Balance Overall balance assessment: Needs assistance Sitting-balance support: Feet supported;Single extremity supported Sitting balance-Leahy Scale: Poor Sitting balance - Comments: minA-modA for initial 5 min, pt with increasing posterior lean, unable to redirect posture, required maxA for support Postural control: Posterior lean     Standing balance comment: deferred                           ADL either performed or assessed with clinical judgement   ADL Overall ADL's : Needs assistance/impaired     Grooming: Wash/dry hands;Minimal assistance;Sitting Grooming Details (indicate cue type and reason): minA to clean under fingernails     Lower Body Bathing: Sitting/lateral leans;Minimal assistance Lower Body Bathing Details (indicate cue type and reason): improvement with bilateral coordination;pt able to squeeze lotion with minA and rub lotion on legs able to access knees     Lower Body Dressing: Maximal assistance;Sitting/lateral leans                 General ADL Comments: pt agreeable to OT session sitting EOB;pt declined use of stedy and resisted sit<>stand;he appears fearful and reports the stedy is uncomfortable;pt sat EOB with minA-modA for first 5 min, progressing to maxA with significant posterior lean, unable to redirect leaning by reaching forward     Vision   Vision Assessment?: Vision impaired- to be further tested in functional context Additional Comments: pt continues to keep eyes shut majority of session, difficult to thoroughly assess vision   Perception     Praxis  Cognition Arousal/Alertness: Awake/alert Behavior During Therapy: Flat affect Overall Cognitive Status: Impaired/Different from baseline Area of Impairment: Following commands;Safety/judgement;Awareness;Problem solving                        Following Commands: Follows one step commands with increased time;Follows multi-step commands inconsistently Safety/Judgement: Decreased awareness of safety;Decreased awareness of deficits Awareness: Intellectual Problem Solving: Difficulty sequencing;Requires verbal cues;Requires tactile cues;Slow processing General Comments: pt responds well to time frame to work with therapist;unsure accuracy of yes/no answers, pt will verbalize one answer and shake head opposite;pt appears to have self-limiting behaviors;appears to be fearful of falling        Exercises Exercises: Other exercises Other Exercises Other Exercises: supine scapular mobilization in sidelying x10 on R scapula   Shoulder Instructions       General Comments vss;pt with stool underneath fingernails assisted pt with thorough cleaning    Pertinent Vitals/ Pain       Pain Assessment: Faces Faces Pain Scale: Hurts even more Pain Location: RUE with movement Pain Descriptors / Indicators: Grimacing;Guarding Pain Intervention(s): Monitored during session;Limited activity within patient's tolerance  Home Living                                          Prior Functioning/Environment              Frequency  Min 2X/week        Progress Toward Goals  OT Goals(current goals can now be found in the care plan section)  Progress towards OT goals: Progressing toward goals  Acute Rehab OT Goals Patient Stated Goal: to get better OT Goal Formulation: With patient Time For Goal Achievement: 03/16/19 Potential to Achieve Goals: Fair ADL Goals Pt Will Perform Grooming: with set-up;sitting Pt Will Perform Upper Body Bathing: with set-up;sitting Pt Will Transfer to Toilet: stand pivot transfer;bedside commode;with mod assist Pt/caregiver will Perform Home Exercise Program: Increased ROM;Right Upper extremity;With written HEP provided Additional ADL Goal #1: Pt will be able to roll left and  right with min A to help with basic ADLs Additional ADL Goal #2: Pt will be able to come up to sit EOB with min A in prep for transfers and EOB ADLs Additional ADL Goal #3: Pt will scan environment for appropriate items for functional tasks with minimal cueing.  Plan Discharge plan remains appropriate    Co-evaluation                 AM-PAC OT "6 Clicks" Daily Activity     Outcome Measure   Help from another person eating meals?: A Little Help from another person taking care of personal grooming?: A Lot Help from another person toileting, which includes using toliet, bedpan, or urinal?: Total Help from another person bathing (including washing, rinsing, drying)?: A Lot Help from another person to put on and taking off regular upper body clothing?: A Little Help from another person to put on and taking off regular lower body clothing?: A Lot 6 Click Score: 13    End of Session    OT Visit Diagnosis: Hemiplegia and hemiparesis;Other symptoms and signs involving cognitive function Symptoms and signs involving cognitive functions: Cerebral infarction Hemiplegia - Right/Left: Right Hemiplegia - dominant/non-dominant: Dominant Hemiplegia - caused by: Cerebral infarction   Activity Tolerance Patient tolerated treatment well;Other (comment)(pt limited by self-limiting behaviors and fear of falling)   Patient  Left in bed;with call bell/phone within reach;with bed alarm set   Nurse Communication Mobility status        Time: 8841-6606 OT Time Calculation (min): 32 min  Charges: OT General Charges $OT Visit: 1 Visit OT Treatments $Self Care/Home Management : 23-37 mins  Diona Browner OTR/L Acute Rehabilitation Services Office: [phone]    Rebeca Alert 03/17/2019, 11:56 AM

## 2019-03-18 NOTE — Progress Notes (Signed)
Encouraged patient to get into chair.  He adamantly refused despite repeated attempts.

## 2019-03-18 NOTE — Progress Notes (Signed)
  Speech Language Pathology Treatment: Cognitive-Linquistic  Patient Details Name: Philip Vasquez MRN: [id number] DOB: 1960-07-08 Today's Date: 03/18/2019 Time: 0623-7628 SLP Time Calculation (min) (ACUTE ONLY): 10 min  Assessment / Plan / Recommendation Clinical Impression  Skilled treatment session focused on recalling current plan of care, discharge plan and recall of current physical deficits that impede independent discharge to home. Pt able to participate and acknowledge progress within ST, with Min A questions, he is able to demosntrate intellectual awareness of physical deficits and demonstrates some insight into need to participate fully in PT.   Pt has been participating in skilled ST targeting cognition and as such he has made good progress towards goals. Pt has met his goals and is currently Mod A with alternating attention and Min A with complex problem solving. As a result, pt is appropriate for discharge from skilled ST at this time. When pt discharges to SNF would recommend that ST re-assess pt's cognitive function within their tasks to increase independence with that specific setting. All education has been completed with pt and nursing consulted, all in support of this decision.    HPI HPI: Pt is a 58 year old gentleman with no past medical history who presented with AMS and right-sided weakness. MRI: Widely scattered small acute infarcts in the medial lefthemisphere involving left ACA and left ACA/MCA watershed area. UA positive for cocaine use.       SLP Plan  All goals met;Discharge SLP treatment due to (comment)(Goals met)       Recommendations   SNF follow up, 24 hour supervision                Oral Care Recommendations: Oral care BID Follow up Recommendations: 24 hour supervision/assistance;Skilled Nursing facility SLP Visit Diagnosis: Cognitive communication deficit (R41.841) Plan: All goals met;Discharge SLP treatment due to (comment)(Goals met)        GO                [name] 03/18/2019, 4:26 PM

## 2019-03-18 NOTE — Progress Notes (Signed)
   Subjective: Philip Vasquez has no complaints today. He denies any scrotal pain or discomfort. He says his mood is good. He does not want to stand or walk with therapists today or in the future. It doesn't hurt, he just doesn't want to do it. He has no other complaints or concerns.  Consults: na  Objective:  Vital signs in last 24 hours: Vitals:   03/17/19 1957 03/18/19 0001 03/18/19 0416 03/18/19 0933  BP: 128/82 127/65 121/75 135/83  Pulse: 68 71 69 73  Resp: 18 18 20 18   Temp: 98.4 F (36.9 C) 98 F (36.7 C) 98.2 F (36.8 C) 98.3 F (36.8 C)  TempSrc: Oral Oral Oral Oral  SpO2: 98% 96% 98% 98%  Weight:      Height:        Physical Exam  Constitutional: No distress.  Cardiovascular: Normal rate, regular rhythm, normal heart sounds and intact distal pulses.  No murmur heard. Pulmonary/Chest: Effort normal. No respiratory distress. He exhibits no tenderness.  Abdominal: Soft. Bowel sounds are normal. He exhibits no distension. There is no abdominal tenderness.  Musculoskeletal:        General: Normal range of motion.  Neurological: He is alert.  Skin: Skin is warm and dry. He is not diaphoretic. No erythema.  Nursing note and vitals reviewed.  I/Os:  Intake/Output Summary (Last 24 hours) at 03/18/2019 1009 Last data filed at 03/18/2019 0929 Gross per 24 hour  Intake 120 ml  Output 550 ml  Net -430 ml   Assessment/Plan:  Assessment: Philip Vasquez is a 58 year old male with daily cocaine use and no previously recorded medical history who presented with altered mental status and right-sided weakness, found to have left ACA/MCA watershed infarcts now status post 3 weeks of dual antiplatelet therapy whose stay has been complicated by urinary and GI bleeding, CAUTI status post antibiotics and depression with improvement in mood on prozac and remeron. Patient medically stable for discharge however disposition difficult given insurance status. Social work and case management are  working to obtain appropriate posthospitalization disposition.  Plan: Principal Problem:   Cerebral thrombosis with cerebral infarction   Right sided weakness   Hypertension   Hyperlipidemia  -Status post 3 weeks of dual antiplatelet therapy  Plan: -Continue aspirin and statin -Baclofen twice daily -Continue PT/OT/ST -up in chair for meals   Active Problems:   Urinary retention -urinating well -continue flomax 0.8 daily     Anemia of chronic disease/Acute blood loss anemia -Hgb continuing to increase; 10.9 on 03/12/2019    Depression -Continue Prozac 60 mg daily  -Continue mirtazapine at current dose  Dispo: Anticipated discharge pending Medicaid approval and SNF placement.  Al Decant, MD 03/18/2019, 10:09 AM Pager: [phone]

## 2019-03-19 NOTE — Progress Notes (Signed)
   Subjective: Patient resting in bed with no concerns.   Objective:  Vital signs in last 24 hours: Vitals:   03/18/19 1603 03/18/19 1948 03/18/19 2339 03/19/19 0402  BP: 119/85 121/78 102/74 109/78  Pulse: 64 69 62 62  Resp: 18 18 18 18   Temp: 98.5 F (36.9 C) 98.7 F (37.1 C) 98.1 F (36.7 C) 98.2 F (36.8 C)  TempSrc: Oral Oral Oral Oral  SpO2: 100% 98% 96% 99%  Weight:      Height:         Physical Exam  Constitutional: No distress.  Cardiovascular: Normal rate, regular rhythm, normal heart sounds and intact distal pulses.  No murmur heard. Pulmonary/Chest: Effort normal. No respiratory distress. He exhibits no tenderness.  Abdominal: Soft. Bowel sounds are normal. He exhibits no distension. There is no abdominal tenderness.  Musculoskeletal:        General: Normal range of motion.  Neurological: He is alert.  Baseline R sided weakness   Skin: Skin is warm and dry. He is not diaphoretic. No erythema.  Nursing note and vitals reviewed.  I/Os:  Intake/Output Summary (Last 24 hours) at 03/19/2019 0531 Last data filed at 03/18/2019 2340 Gross per 24 hour  Intake 120 ml  Output 850 ml  Net -730 ml   Assessment/Plan:  Assessment: Mr. Bartnik is a 58 year old male with daily cocaine use and no previously recorded medical history who presented with altered mental status and right-sided weakness, found to have left ACA/MCA watershed infarcts now status post 3 weeks of dual antiplatelet therapy whose stay has been complicated by urinary and GI bleeding, CAUTI status post antibiotics and depression with improvement in mood on prozac and remeron. Patient medically stable for discharge however disposition difficult given insurance status. Social work and case management are working to obtain appropriate posthospitalization disposition.  Plan: Principal Problem:   Cerebral thrombosis with cerebral infarction   Right sided weakness   Hypertension    Hyperlipidemia  Plan:  #CVA -Continue aspirin and statin -Baclofen twice daily -Continue PT/OT/ST -up in chair for meals   Active Problems:   #Urinary retention -Condom cath in place, urinating well currently  -continue flomax 0.8 daily   #HTN -Continue amlodipine, hydralazine, losartan and metoprolol tartrate  # Depression -Continue Prozac 60 mg daily  -Continue mirtazapine at current dose  Dispo: Anticipated discharge pending Medicaid approval and SNF placement.  Tamsen Snider, MD PGY1  [phone]

## 2019-03-19 NOTE — TOC Progression Note (Signed)
Transition of Care [facility]) - Progression Note    Patient Details  Name: Philip Vasquez MRN: [id number] Date of Birth: 05-Jan-1961  Transition of Care [facility]) CM/SW [place], [place] Phone Number: 03/19/2019, 8:54 AM  Clinical Narrative:    Continues to have medicaid pending and no bed offers. TOC following for placement. Multiple barriers exist please see below.    Expected Discharge Plan: Skilled Nursing Facility Barriers to Discharge: SNF Pending payor source - LOG, SNF Pending bed offer, Inadequate or no insurance, Active Substance Use - Placement  Expected Discharge Plan and Services Expected Discharge Plan: [place] In-house Referral: Clinical Social Work Discharge Planning Services: CM Consult   Readmission Risk Interventions No flowsheet data found.

## 2019-03-19 NOTE — TOC Progression Note (Signed)
Transition of Care [facility]) - Progression Note    Patient Details  Name: Philip Vasquez MRN: [id number] Date of Birth: 08-13-1960  Transition of Care [facility]) CM/SW [place], [place] Phone Number: 03/19/2019, 2:06 PM  Clinical Narrative:    At request of [facility] leadership pt referral re-sent to [facility] and [employer].  Medicaid pending, TOC team continues to follow.    Expected Discharge Plan: Skilled Nursing Facility Barriers to Discharge: SNF Pending payor source - LOG, SNF Pending bed offer, Inadequate or no insurance, Active Substance Use - Placement  Expected Discharge Plan and Services Expected Discharge Plan: [place] In-house Referral: Clinical Social Work Discharge Planning Services: CM Consult  Readmission Risk Interventions No flowsheet data found.

## 2019-03-20 LAB — HEMOGLOBIN A1C
Hgb A1c MFr Bld: 5.8 % — ABNORMAL HIGH (ref 4.8–5.6)
Mean Plasma Glucose: 119.76 mg/dL

## 2019-03-20 NOTE — Progress Notes (Signed)
   Subjective: Resting in bed watching football, no concerns today.    Objective:  Vital signs in last 24 hours: Vitals:   03/19/19 1922 03/19/19 2046 03/20/19 0003 03/20/19 0410  BP: 120/82 (!) 121/91 111/70 132/88  Pulse: 71 73 63 64  Resp: 16 18 18 18   Temp: (!) 97.4 F (36.3 C) 98.5 F (36.9 C) 98.4 F (36.9 C) 99.3 F (37.4 C)  TempSrc: Oral Oral Oral Oral  SpO2: 99% 98% 98% 99%  Weight:      Height:         Physical Exam  Constitutional: He is well-developed, well-nourished, and in no distress.  Cardiovascular: Normal rate, regular rhythm, normal heart sounds and intact distal pulses.  No murmur heard. Abdominal: Soft. Bowel sounds are normal. He exhibits no distension. There is no abdominal tenderness.  Musculoskeletal:        General: Normal range of motion.  Neurological: He is alert.  Baseline R sided weakness   Skin: Skin is warm and dry.  Psychiatric: Mood and affect normal.  Vitals reviewed.  I/Os:  Intake/Output Summary (Last 24 hours) at 03/20/2019 0543 Last data filed at 03/20/2019 0535 Gross per 24 hour  Intake 480 ml  Output 1200 ml  Net -720 ml   Assessment/Plan:  Assessment: Mr. Sanjuan is a 58 year old male with daily cocaine use and no previously recorded medical history who presented with altered mental status and right-sided weakness, found to have left ACA/MCA watershed infarcts now status post 3 weeks of dual antiplatelet therapy whose stay has been complicated by urinary and GI bleeding, CAUTI status post antibiotics and depression with improvement in mood on prozac and remeron. Patient medically stable for discharge however disposition difficult given insurance status. Social work and case management are working to obtain appropriate posthospitalization disposition.  Plan: Principal Problem:   Cerebral thrombosis with cerebral infarction   Right sided weakness   Hypertension   Hyperlipidemia  Plan:  #CVA -Continue aspirin and  statin -Baclofen twice daily -Continue PT/OT/ST -up in chair for meals    #Urinary retention ( resolved)  -Condom cath in place -continue flomax 0.8 daily   #HTN -Continue amlodipine, hydralazine, losartan and metoprolol tartrate  # Depression -Continue Prozac 60 mg daily  -Continue mirtazapine at current dose  Dispo: Anticipated discharge pending Medicaid approval and SNF placement.  Tamsen Snider, MD PGY1  [phone]

## 2019-03-21 NOTE — Progress Notes (Signed)
   Subjective: Resting in bed . Suggested letting nurse help him to the chair daily to maintain core strength, patient agreeable.    Objective:  Vital signs in last 24 hours: Vitals:   03/20/19 1639 03/20/19 2025 03/20/19 2333 03/21/19 0435  BP: 128/80 119/85 103/78 112/78  Pulse: 72 77 64 63  Resp: 20 18 18 18   Temp: 98.4 F (36.9 C) 98.5 F (36.9 C) 97.9 F (36.6 C) (!) 97.3 F (36.3 C)  TempSrc: Oral Oral Oral Oral  SpO2: 97% 96% 98% 96%  Weight:      Height:         Physical Exam  Constitutional: He is well-developed, well-nourished, and in no distress.  Cardiovascular: Normal rate, regular rhythm, normal heart sounds and intact distal pulses.  No murmur heard. Abdominal: Soft. Bowel sounds are normal. He exhibits no distension. There is no abdominal tenderness.  Musculoskeletal:        General: Normal range of motion.  Neurological: He is alert.  Baseline R sided weakness   Skin: Skin is warm and dry.  Psychiatric: Mood and affect normal.  Vitals reviewed.  I/Os:  Intake/Output Summary (Last 24 hours) at 03/21/2019 0602 Last data filed at 03/21/2019 0535 Gross per 24 hour  Intake 540 ml  Output 850 ml  Net -310 ml   Assessment/Plan:  Assessment: Mr. Socarras is a 58 year old male with daily cocaine use and no previously recorded medical history who presented with altered mental status and right-sided weakness, found to have left ACA/MCA watershed infarcts now status post 3 weeks of dual antiplatelet therapy whose stay has been complicated by urinary and GI bleeding, CAUTI status post antibiotics and depression with improvement in mood on prozac and remeron. Patient medically stable for discharge however disposition difficult given insurance status. Social work and case management are working to obtain appropriate post hospitalization disposition.  Plan: Principal Problem:   Cerebral thrombosis with cerebral infarction   Right sided weakness   Hypertension  Hyperlipidemia  Plan:  #CVA -Continue aspirin and statin -Baclofen twice daily -Continue PT/OT/ST -up in chair for meals    #Urinary retention ( resolved)  -Condom cath in place -continue flomax 0.8 daily   #HTN -Continue amlodipine, hydralazine, losartan and metoprolol tartrate  # Depression -Continue Prozac 60 mg daily  -Continue mirtazapine at current dose  #Health maintenance - Hgb A1C 5.8%  Dispo: Anticipated discharge pending Medicaid approval and SNF placement.  Tamsen Snider, MD PGY1  [phone]

## 2019-03-22 NOTE — Progress Notes (Signed)
Pt.'s mom called, wants to know if cream is being put on his feet. Stated that we are still doing this every day. Also, mom and brother wanted to speak with patient. The patient however continues to refuse to speak with them and does not want his mom visiting.

## 2019-03-22 NOTE — Progress Notes (Signed)
Nutrition Follow-up  DOCUMENTATION CODES:   Obesity unspecified  INTERVENTION:  Continue Ensure Enlive po once daily, each supplement provides 350 kcal and 20 grams of protein.  Continue 30 ml Prostat po BID, each supplement provides 100 kcal and 15 grams of protein.   Encourage adequate PO intake.   NUTRITION DIAGNOSIS:   Inadequate oral intake related to lethargy/confusion as evidenced by estimated needs(reported meal completion); improved  GOAL:   Patient will meet greater than or equal to 90% of their needs; progressing  MONITOR:   PO intake, Supplement acceptance, Skin, Weight trends, Labs, I & O's  REASON FOR ASSESSMENT:   Malnutrition Screening Tool    ASSESSMENT:   58 year old man with hyperlipidemia, hypertension, and cocaine use disorder admitted with right upper and lower extremity weakness admitted with scattered acute infarcts in the medial left ACA and MCA watershed distribution. Pt medically stable for discharge, however difficult disposition given insurance status. Social work and case management are working to obtain appropriate post hospitalization disposition.  Meal completion has been varied from 25-100%. Pt currently has Ensure and Prostat ordered and has been consuming them. RD to continue with current orders to aid in caloric and protein needs. Labs and medications reviewed.   Diet Order:   Diet Order            Diet regular Room service appropriate? Yes with Assist; Fluid consistency: Thin  Diet effective now              EDUCATION NEEDS:   Not appropriate for education at this time  Skin:  Skin Assessment: Reviewed RN Assessment Skin Integrity Issues:: Other (Comment) Other: N/A  Last BM:  12/13  Height:   Ht Readings from Last 1 Encounters:  12/19/18 5\' 9"  (1.753 m)    Weight:   Wt Readings from Last 1 Encounters:  03/16/19 135 kg    Ideal Body Weight:  72.7 kg  BMI:  Body mass index is 43.95 kg/m.  Estimated  Nutritional Needs:   Kcal:  2000-2200  Protein:  100-115 grams  Fluid:  >/= 2 L/day    Corrin Parker, MS, RD, LDN Pager # [phone] After hours/ weekend pager # [phone]

## 2019-03-22 NOTE — Progress Notes (Addendum)
   Subjective:  Mr. Vo was seen at bedside this morning. Patient states that he is doing fine today with no complaints overnight. Patient denies any nausea, vomiting, diarrhea/constipation.  Objective:  Vital signs in last 24 hours: Vitals:   03/21/19 1719 03/21/19 2009 03/22/19 0005 03/22/19 0449  BP: 134/79 134/85 106/74 130/83  Pulse: 73 79 73 65  Resp:  17 17 17   Temp: 98.2 F (36.8 C) 98.3 F (36.8 C) 98.5 F (36.9 C) 98.3 F (36.8 C)  TempSrc: Oral Oral Oral Oral  SpO2: 97% 96% 94% 95%  Weight:      Height:       Physical Exam Vitals and nursing note reviewed.  Constitutional:      General: He is not in acute distress.    Appearance: Normal appearance. He is not ill-appearing or toxic-appearing.  HENT:     Head: Normocephalic and atraumatic.  Cardiovascular:     Rate and Rhythm: Normal rate and regular rhythm.     Pulses: Normal pulses.     Heart sounds: Normal heart sounds. No murmur. No friction rub. No gallop.   Pulmonary:     Effort: Pulmonary effort is normal.     Breath sounds: Normal breath sounds. No wheezing, rhonchi or rales.  Abdominal:     General: Bowel sounds are normal.     Palpations: Abdomen is soft.     Tenderness: There is no abdominal tenderness. There is no guarding.  Musculoskeletal:        General: No swelling.     Right lower leg: No edema.     Left lower leg: No edema.     Comments: RUE RLE 4/5 strength   Neurological:     Mental Status: He is alert and oriented to person, place, and time.  Psychiatric:        Mood and Affect: Mood normal.        Behavior: Behavior normal.     Assessment/Plan:  Principal Problem:   Cerebral thrombosis with cerebral infarction Active Problems:   Right sided weakness   Hypertension   Hyperlipidemia   Ulcer of penis   Urinary retention   Depression   Hematuria   Anemia of chronic disease   Acute blood loss anemia  Mr. Demuro is a 58 y/o male and used cocaine daily, with no prior  medical history presented to the [facility] with AMS and R sided weakness. Imaging showed ACA/MCA watershed infarcts and completed 3 weeks of dual antiplatelet therapy whose medical course has been complicated by urinary and GI bleeding, CAUTI, with a completed antibiotic course, and depression which is managed on prozac and remeron. Patient stable for discharge, but difficult to due to lack of insurance. Social work and case management are working toward obtaining the appropriate placement for disposition.   1. ACA/MCA Stroke - Continue aspirin and statin - Continue Baclofen 5 mg BID - Continue PT/OT/ST   2. Urinary retention (Resolved) -Condom cath in place -Continue flomax 0.8 QD   3. HTN - Continue amlodipine 10 QD - Continue hydralazine 75 TID - Continue losartan 50 mg QD - Continue metoprolol tartrate 25 BID  4. Depression -Continue Prozac 60 mg QD -Continue mirtazapine at current dose  5. Health maintenance - Hgb A1C 5.8%  Dispo: Anticipated discharge pending medical course.   Maudie Mercury, MD IMTS, PGY-1 Pager: [phone] 03/22/2019,6:07 AM

## 2019-03-22 NOTE — Progress Notes (Signed)
PT Cancellation Note  Patient Details Name: Philip Vasquez Needs MRN: [id number] DOB: 10/12/1960   Cancelled Treatment:    Reason Eval/Treat Not Completed: Patient declined, no reason specified patient adamantly and stubbornly refuses therapy today. When asked why, he states "doesn't matter, I'm not doing it". Educated on significant functional weakness/deconditioning related to staying in bed, continues to refuse and states "staying in bed does me way more good than that chair, I'll do it the rest of my life". Unable to give a reason why he won't participate other than he doesn't want to and doesn't like therapy. Attempted to ask orientation questions but he refuses to answer, stating "those don't matter".  Will attempt back on next date of service.    Windell Norfolk, DPT, PN1   Supplemental Physical Therapist [facility]    Pager [phone] Acute Rehab Office [phone]

## 2019-03-23 NOTE — Progress Notes (Signed)
   Subjective:  Philip Vasquez was seen and evaluated at bedside this morning. He states that he is doing well and has no complaints at this time.   Objective:  Vital signs in last 24 hours: Vitals:   03/22/19 2000 03/23/19 0000 03/23/19 0409 03/23/19 0820  BP: 120/82 115/78 114/87 (!) 130/92  Pulse: 68 74 67 65  Resp: 17 18 19 18   Temp: 98.1 F (36.7 C) 98 F (36.7 C) 97.7 F (36.5 C) 98.1 F (36.7 C)  TempSrc: Oral Oral Oral Oral  SpO2: 99% 98% 96% 97%  Weight:      Height:       Physical Exam Vitals and nursing note reviewed.  Constitutional:      General: He is not in acute distress.    Appearance: Normal appearance. He is not ill-appearing or toxic-appearing.  HENT:     Head: Normocephalic and atraumatic.  Cardiovascular:     Rate and Rhythm: Normal rate and regular rhythm.     Pulses: Normal pulses.     Heart sounds: Normal heart sounds. No murmur. No friction rub. No gallop.   Pulmonary:     Effort: Pulmonary effort is normal.     Breath sounds: Normal breath sounds. No wheezing, rhonchi or rales.  Abdominal:     General: Bowel sounds are normal.     Palpations: Abdomen is soft.     Tenderness: There is no abdominal tenderness. There is no guarding.  Musculoskeletal:        General: No swelling.     Right lower leg: No edema.     Left lower leg: No edema.     Comments: RUE RLE 4/5 strength   Neurological:     Mental Status: He is alert and oriented to person, place, and time.  Psychiatric:        Mood and Affect: Mood normal.        Behavior: Behavior normal.     Assessment/Plan:  Principal Problem:   Cerebral thrombosis with cerebral infarction Active Problems:   Right sided weakness   Hypertension   Hyperlipidemia   Ulcer of penis   Urinary retention   Depression   Hematuria   Anemia of chronic disease   Acute blood loss anemia  Philip Vasquez is a 58 y/o male and used cocaine daily, with no prior medical history presented to the [facility] with AMS and  R sided weakness. Imaging showed ACA/MCA watershed infarcts and completed 3 weeks of dual antiplatelet therapy whose medical course has been complicated by urinary and GI bleeding, CAUTI, with a completed antibiotic course, and depression which is managed on prozac and remeron. Patient stable for discharge, but having difficulties due to lack of insurance. Social work and case management are working toward obtaining the appropriate placement for disposition.   1. ACA/MCA Stroke - Continue aspirin and statin - Continue Baclofen 5 mg BID - Continue PT/OT/ST  2. HTN - Continue amlodipine 10 QD - Continue hydralazine 75 TID - Continue losartan 50 mg QD - Continue metoprolol tartrate 25 BID  3. Depression -Continue Prozac 60 mg QD -Continue mirtazapine at current dose  4. Health maintenance - Hgb A1C 5.8%   Urinary Retention (Resolved) -Condom cath in place -Continue flomax 0.8 QD  Dispo: Anticipated discharge pending medical course.   Maudie Mercury, MD IMTS, PGY-1 Pager: [phone] 03/23/2019,11:14 AM

## 2019-03-23 NOTE — TOC Progression Note (Signed)
Transition of Care [facility]) - Progression Note    Patient Details  Name: Philip Vasquez MRN: [id number] Date of Birth: 1961-03-31  Transition of Care [facility]) CM/SW [place], [place] Phone Number: 03/23/2019, 11:20 AM  Clinical Narrative:    Pt has been approved for Medicaid per financial counselor Norville Haggard. Information also provided to floor RNCM. Pt referral f/u on with [facility] and [facility] in [place].   Expected Discharge Plan: Skilled Nursing Facility Barriers to Discharge: SNF Pending payor source - LOG, SNF Pending bed offer, Inadequate or no insurance, Active Substance Use - Placement  Expected Discharge Plan and Services Expected Discharge Plan: [place] In-house Referral: Clinical Social Work Discharge Planning Services: CM Consult  Readmission Risk Interventions No flowsheet data found.

## 2019-03-23 NOTE — Progress Notes (Signed)
Physical Therapy Treatment Patient Details Name: Philip Vasquez MRN: [id number] DOB: 08-Apr-1961 Today's Date: 03/23/2019    History of Present Illness Philip Vasquez is a 58 year old gentleman with no past medical history who presented with AMS and right-sided weakness. MRI: Widely scattered small acute infarcts in the medial lefthemisphere involving left ACA and left ACA/MCA watershed area.    PT Comments    Pt agreeable to working with therapy today but not agreeable to attempted transfer to chair or ambulation. Pt escalates easily. Pt tolerated sitting EOB 12 mins working on trunk control and reaching activities as well as LE there ex. Pt fatigued after 10 mins, needed mod A to sit due to posterior lean. PT will continue to follow.    Follow Up Recommendations  SNF;Supervision/Assistance - 24 hour     Equipment Recommendations  Wheelchair (measurements PT);Wheelchair cushion (measurements PT);Hospital bed;Other (comment)    Recommendations for Other Services       Precautions / Restrictions Precautions Precautions: Fall Precaution Comments: R UE/LE weakness/tone Restrictions Weight Bearing Restrictions: No    Mobility  Bed Mobility Overal bed mobility: Needs Assistance Bed Mobility: Supine to Sit;Sit to Supine;Rolling Rolling: Min assist Sidelying to sit: Mod assist   Sit to supine: Mod assist;+2 for safety/equipment;+2 for physical assistance   General bed mobility comments: mod A for LE's off bed, mod A at trunk for elevation into sitting. Mod A +2 for return to supine, min A for rolling to position. Pt able to assist with scoot up in bed by pulling on headboard with LUE  Transfers Overall transfer level: Needs assistance Equipment used: Rolling walker (2 wheeled)             General transfer comment: encouragement given for pt to transfer to chair or even stand EOB but he was adamant that he was not going to stand up  Ambulation/Gait             General Gait  Details: pt refused to attempt   Stairs             Wheelchair Mobility    Modified Rankin (Stroke Patients Only) Modified Rankin (Stroke Patients Only) Pre-Morbid Rankin Score: Moderate disability Modified Rankin: Moderately severe disability     Balance Overall balance assessment: Needs assistance Sitting-balance support: Feet supported;Single extremity supported Sitting balance-Leahy Scale: Poor Sitting balance - Comments: minA for initial 5 min, pt with increasing posterior lean Postural control: Posterior lean                                  Cognition Arousal/Alertness: Awake/alert Behavior During Therapy: Flat affect Overall Cognitive Status: Impaired/Different from baseline Area of Impairment: Following commands;Safety/judgement;Awareness;Problem solving                 Orientation Level: Disoriented to;Time Current Attention Level: Selective Memory: Decreased recall of precautions;Decreased short-term memory Following Commands: Follows one step commands with increased time;Follows multi-step commands inconsistently Safety/Judgement: Decreased awareness of safety;Decreased awareness of deficits Awareness: Intellectual Problem Solving: Difficulty sequencing;Requires verbal cues;Requires tactile cues;Slow processing General Comments: pt escalates easily      Exercises General Exercises - Upper Extremity Shoulder Flexion: AROM;5 reps;Right;Left;10 reps;AAROM General Exercises - Lower Extremity Long Arc Quad: AROM;Both;10 reps;Seated    General Comments General comments (skin integrity, edema, etc.): VSS. Worked on seated wt shifting and reaching activities, pt able to perform with min-guard, began posterior lean after this though due to fatigue  Pertinent Vitals/Pain Pain Assessment: Faces Faces Pain Scale: Hurts even more Pain Location: RUE with movement and shoulder with palpation Pain Descriptors / Indicators:  Grimacing;Guarding Pain Intervention(s): Limited activity within patient's tolerance    Home Living                      Prior Function            PT Goals (current goals can now be found in the care plan section) Acute Rehab PT Goals Patient Stated Goal: to walk PT Goal Formulation: With patient Time For Goal Achievement: 03/29/19 Potential to Achieve Goals: Fair Progress towards PT goals: Not progressing toward goals - comment(self limiting)    Frequency    Min 1X/week      PT Plan Current plan remains appropriate    Vasquez-evaluation              AM-PAC PT "6 Clicks" Mobility   Outcome Measure  Help needed turning from your back to your side while in a flat bed without using bedrails?: A Lot Help needed moving from lying on your back to sitting on the side of a flat bed without using bedrails?: A Lot Help needed moving to and from a bed to a chair (including a wheelchair)?: A Lot Help needed standing up from a chair using your arms (e.g., wheelchair or bedside chair)?: A Lot Help needed to walk in hospital room?: Total Help needed climbing 3-5 steps with a railing? : Total 6 Click Score: 10    End of Session   Activity Tolerance: Other (comment)(pt self limiting) Patient left: in bed;with bed alarm set;with call bell/phone within reach Nurse Communication: Mobility status PT Visit Diagnosis: Other abnormalities of gait and mobility (R26.89);Hemiplegia and hemiparesis;Muscle weakness (generalized) (M62.81) Hemiplegia - Right/Left: Right Hemiplegia - dominant/non-dominant: Dominant Hemiplegia - caused by: Cerebral infarction     Time: 7001-7494 PT Time Calculation (min) (ACUTE ONLY): 16 min  Charges:  $Therapeutic Activity: 8-22 mins                     Philip Vasquez, PT  Acute Rehab Services  Pager [phone] Office [phone]    Philip Vasquez 03/23/2019, 1:45 PM

## 2019-03-24 NOTE — Progress Notes (Signed)
Subjective:  [name] was seen and evaluated at bedside this morning. We reported to the patient that he has become eligible for medicaid, and that social services are currently working on applying for SNF placement. Patient expressed joy upon hearing the news. He has no complaints at this time. All questions and concerns were addressed.   Objective:  Vital signs in last 24 hours: Vitals:   03/23/19 2324 03/24/19 0312 03/24/19 0414 03/24/19 0749  BP: 129/88 109/72  (!) 123/93  Pulse: 71 62  62  Resp: 18 18  18   Temp: 98.2 F (36.8 C) 97.9 F (36.6 C)  97.8 F (36.6 C)  TempSrc: Oral Oral  Oral  SpO2: 97% 98%  98%  Weight:   133 kg   Height:      Physical Exam Vitals and nursing note reviewed.  Constitutional:      General: He is not in acute distress.    Appearance: Normal appearance. He is not ill-appearing or toxic-appearing.  HENT:     Head: Normocephalic and atraumatic.  Eyes:     General:        Right eye: No discharge.        Left eye: No discharge.     Conjunctiva/sclera: Conjunctivae normal.  Cardiovascular:     Rate and Rhythm: Normal rate and regular rhythm.     Pulses: Normal pulses.     Heart sounds: Normal heart sounds. No murmur. No friction rub. No gallop.   Pulmonary:     Effort: Pulmonary effort is normal.     Breath sounds: Normal breath sounds. No wheezing, rhonchi or rales.  Abdominal:     General: Bowel sounds are normal.     Palpations: Abdomen is soft.     Tenderness: There is no abdominal tenderness. There is no guarding.  Musculoskeletal:        General: No swelling.     Right lower leg: No edema.     Left lower leg: No edema.     Comments: Right upper extremity with 4/5 strength  Right lower extremity with 4/5 strength.   Neurological:     General: No focal deficit present.     Mental Status: He is alert and oriented to person, place, and time.  Psychiatric:        Mood and Affect: Mood normal.        Behavior: Behavior normal.      Assessment/Plan:  Principal Problem:   Cerebral thrombosis with cerebral infarction Active Problems:   Right sided weakness   Hypertension   Hyperlipidemia   Ulcer of penis   Urinary retention   Depression   Hematuria   Anemia of chronic disease   Acute blood loss anemia  Mr. Cerezo is a 58 y/o male and used cocaine daily, with no prior medical history presented to the [facility] with AMS and R sided weakness. Imaging showed ACA/MCA watershed infarcts. and completed 3 weeks of dual antiplatelet therapy.   1. ACA/MCA Stroke: - Continue aspirin and statin - Continue Baclofen 5 mg BID - Continue PT/OT/ST  2. HTN: - Continue amlodipine 10 QD - Continue hydralazine 75 TID - Continue losartan 50 mg QD - Continue metoprolol tartrate 25 BID  3. Depression: -Continue Prozac 60 mg QD -Continue mirtazapine at current dose  4. Health maintenance: - Hgb A1C 5.8%   Urinary Retention (Resolved) -Condom cath intact -Continue flomax 0.8 QD   Dispo: Anticipated discharge pending SNF placement.   Maudie Mercury, MD IMTS,  PGY-1 Pager: [phone] 03/24/2019,8:22 AM

## 2019-03-24 NOTE — TOC Progression Note (Signed)
Transition of Care [facility]) - Progression Note    Patient Details  Name: Philip Vasquez MRN: [id number] Date of Birth: 1960/07/26  Transition [facility]) CM/SW [place], [place] Phone Number: 03/24/2019, 1:48 PM  Clinical Narrative:    CSW spoke with Ebony Hail at [facility]- they are reviewing pt. Question about disability application status.   CSW f/u with Norville Haggard in financial counseling. Pt application for disability is being managed by [facility]. It was submitted and is pending as of 10/7 with no current updates.  This update was communicated to SNF- they are having issues approving pt for admission without disability.    Expected Discharge Plan: Skilled Nursing Facility Barriers to Discharge: SNF Pending payor source - LOG, SNF Pending bed offer, Inadequate or no insurance, Active Substance Use - Placement  Expected Discharge Plan and Services Expected Discharge Plan: [place] In-house Referral: Clinical Social Work Discharge Planning Services: CM Consult   Readmission Risk Interventions No flowsheet data found.

## 2019-03-25 NOTE — Plan of Care (Signed)
  Problem: Education: Goal: Knowledge of General Education information will improve Description Including pain rating scale, medication(s)/side effects and non-pharmacologic comfort measures Outcome: Progressing   Problem: Health Behavior/Discharge Planning: Goal: Ability to manage health-related needs will improve Outcome: Progressing   Problem: Clinical Measurements: Goal: Ability to maintain clinical measurements within normal limits will improve Outcome: Progressing Goal: Will remain free from infection Outcome: Progressing Goal: Diagnostic test results will improve Outcome: Progressing Goal: Respiratory complications will improve Outcome: Progressing Goal: Cardiovascular complication will be avoided Outcome: Progressing   Problem: Activity: Goal: Risk for activity intolerance will decrease Outcome: Progressing   Problem: Nutrition: Goal: Adequate nutrition will be maintained Outcome: Progressing   Problem: Coping: Goal: Level of anxiety will decrease Outcome: Progressing   Problem: Elimination: Goal: Will not experience complications related to bowel motility Outcome: Progressing Goal: Will not experience complications related to urinary retention Outcome: Progressing   Problem: Pain Managment: Goal: General experience of comfort will improve Outcome: Progressing   Problem: Safety: Goal: Ability to remain free from injury will improve Outcome: Progressing   Problem: Skin Integrity: Goal: Risk for impaired skin integrity will decrease Outcome: Progressing   Problem: Education: Goal: Knowledge of disease or condition will improve Outcome: Progressing Goal: Knowledge of secondary prevention will improve Outcome: Progressing Goal: Knowledge of patient specific risk factors addressed and post discharge goals established will improve Outcome: Progressing Goal: Individualized Educational Video(s) Outcome: Progressing   Problem: Coping: Goal: Will verbalize  positive feelings about self Outcome: Progressing Goal: Will identify appropriate support needs Outcome: Progressing   Problem: Health Behavior/Discharge Planning: Goal: Ability to manage health-related needs will improve Outcome: Progressing   Problem: Self-Care: Goal: Ability to participate in self-care as condition permits will improve Outcome: Progressing Goal: Verbalization of feelings and concerns over difficulty with self-care will improve Outcome: Progressing Goal: Ability to communicate needs accurately will improve Outcome: Progressing   Problem: Nutrition: Goal: Risk of aspiration will decrease Outcome: Progressing Goal: Dietary intake will improve Outcome: Progressing   Problem: Ischemic Stroke/TIA Tissue Perfusion: Goal: Complications of ischemic stroke/TIA will be minimized Outcome: Progressing  Starlena Beil, BSN, RN 

## 2019-03-25 NOTE — Progress Notes (Signed)
   Subjective:  Mr. Goldammer was seen and evaluated at bedside this morning. He states that he is doing well with no complaints overnight. We discussed that his placement is still pending, and that referrals have been sent out on his behalf. He voiced understanding. All questions and concerns were addressed.   Objective:  Vital signs in last 24 hours: Vitals:   03/24/19 1944 03/24/19 2225 03/24/19 2346 03/25/19 0414  BP: 108/79 125/79 122/83 117/74  Pulse: 69 70 69 (!) 58  Resp: 18  18 17   Temp: 98.1 F (36.7 C) 98 F (36.7 C) 98.4 F (36.9 C) 97.8 F (36.6 C)  TempSrc: Oral Oral Oral Oral  SpO2: 96% 97% 96% 99%  Weight:      Height:      Physical Exam Constitutional:      General: He is not in acute distress.    Appearance: Normal appearance. He is not ill-appearing or toxic-appearing.  HENT:     Head: Normocephalic and atraumatic.  Cardiovascular:     Rate and Rhythm: Normal rate and regular rhythm.     Pulses: Normal pulses.     Heart sounds: Normal heart sounds. No murmur. No friction rub. No gallop.   Pulmonary:     Effort: Pulmonary effort is normal. No respiratory distress.     Breath sounds: Normal breath sounds. No wheezing or rhonchi.  Abdominal:     General: Abdomen is flat. Bowel sounds are normal.     Tenderness: There is no abdominal tenderness.  Neurological:     Mental Status: He is alert and oriented to person, place, and time. Mental status is at baseline.     Comments: RUE and RLE weakness.   Psychiatric:        Mood and Affect: Mood normal.        Behavior: Behavior normal.    Assessment/Plan:  Principal Problem:   Cerebral thrombosis with cerebral infarction Active Problems:   Right sided weakness   Hypertension   Hyperlipidemia   Ulcer of penis   Urinary retention   Depression   Hematuria   Anemia of chronic disease   Acute blood loss anemia  Mr. Catanzaro is a 58 y/o male and used cocaine daily, with no prior medical history presented to  the [facility] with AMS and R sided weakness. Imaging showed ACA/MCA watershed infarcts.   1. ACA/MCA Stroke: - Continue aspirin and statin - Continue Baclofen 5 mg BID - Continue PT/OT/ST  2. Hypertension: - Continue amlodipine 10 QD - Continue hydralazine 75 TID - Continue losartan 50 mg QD - Continue metoprolol tartrate 25 BID  3. Depression: -Continue Prozac 60 mg QD -Continue mirtazapine at current dose   Urinary Retention (Resolved) -Condom cath intact -Continue flomax 0.8 QD   Dispo: Anticipated discharge pending SNF placement.   Maudie Mercury, MD IMTS, PGY-1 Pager: [phone] 03/25/2019,7:11 AM

## 2019-03-25 NOTE — Plan of Care (Signed)
  Problem: Nutrition: Goal: Adequate nutrition will be maintained Outcome: Progressing   

## 2019-03-26 DIAGNOSIS — E872 Acidosis: Secondary | ICD-10-CM

## 2019-03-26 LAB — BASIC METABOLIC PANEL
Anion gap: 21 — ABNORMAL HIGH (ref 5–15)
BUN: 25 mg/dL — ABNORMAL HIGH (ref 6–20)
CO2: 15 mmol/L — ABNORMAL LOW (ref 22–32)
Calcium: 9.5 mg/dL (ref 8.9–10.3)
Chloride: 106 mmol/L (ref 98–111)
Creatinine, Ser: 1.47 mg/dL — ABNORMAL HIGH (ref 0.61–1.24)
GFR calc Af Amer: >60 mL/min (ref >60)
GFR calc non Af Amer: 52 mL/min — ABNORMAL LOW (ref >60)
Glucose, Bld: 103 mg/dL — ABNORMAL HIGH (ref 70–99)
Potassium: 4.2 mmol/L (ref 3.5–5.1)
Sodium: 142 mmol/L (ref 135–145)

## 2019-03-26 LAB — BLOOD GAS, ARTERIAL
Acid-Base Excess: 1.5 mmol/L (ref 0.0–2.0)
Bicarbonate: 25.2 mmol/L (ref 20.0–28.0)
Drawn by: 560031
FIO2: 21
O2 Saturation: 92.7 %
Patient temperature: 36.7
pCO2 arterial: 36.9 mmHg (ref 32.0–48.0)
pH, Arterial: 7.447 (ref 7.350–7.450)
pO2, Arterial: 63.6 mmHg — ABNORMAL LOW (ref 83.0–108.0)

## 2019-03-26 LAB — CBC
HCT: 34.5 % — ABNORMAL LOW (ref 39.0–52.0)
Hemoglobin: 11 g/dL — ABNORMAL LOW (ref 13.0–17.0)
MCH: 27.8 pg (ref 26.0–34.0)
MCHC: 31.9 g/dL (ref 30.0–36.0)
MCV: 87.3 fL (ref 80.0–100.0)
Platelets: 224 10*3/uL (ref 150–400)
RBC: 3.95 MIL/uL — ABNORMAL LOW (ref 4.22–5.81)
RDW: 13.5 % (ref 11.5–15.5)
WBC: 5.1 10*3/uL (ref 4.0–10.5)
nRBC: 0 % (ref 0.0–0.2)

## 2019-03-26 LAB — LACTIC ACID, PLASMA: Lactic Acid, Venous: 1.1 mmol/L (ref 0.5–1.9)

## 2019-03-26 MED ORDER — POLYETHYLENE GLYCOL 3350 17 G PO PACK
17.0000 g | PACK | Freq: Every day | ORAL | Status: DC | PRN
  Administered 2019-04-12 – 2019-04-13 (×2): 17 g via ORAL
  Filled 2019-03-26: qty 1

## 2019-03-26 NOTE — Progress Notes (Signed)
RT drew pt ABG per MD order. RT collected at [phone] and was sent to lab at 0835. Lab team notified. RT will continue to monitor.

## 2019-03-26 NOTE — Progress Notes (Signed)
Subjective:  Philip Vasquez was seen and evaluated by the IMTS service this morning. Patient has no complaints at this time. He denies neglecting his meals, nausea, vomiting, constipation, or diarrhea at this time. We spoke with him about his abnormal labs today, and will continue to monitor them. He voiced agreement to the plan.   Objective:  Vital signs in last 24 hours: Vitals:   03/25/19 2014 03/25/19 2344 03/26/19 0407 03/26/19 0504  BP: 131/78 105/77 117/82   Pulse: 72 63 64   Resp: 18 12 18    Temp: 98 F (36.7 C) 98.1 F (36.7 C) 98.2 F (36.8 C)   TempSrc: Oral Oral Oral   SpO2: 98% 97% 96%   Weight:    132 kg  Height:      Physical Exam Vitals and nursing note reviewed.  Constitutional:      General: He is not in acute distress.    Appearance: Normal appearance. He is not ill-appearing or toxic-appearing.  HENT:     Head: Normocephalic and atraumatic.  Eyes:     General:        Right eye: No discharge.        Left eye: No discharge.     Conjunctiva/sclera: Conjunctivae normal.  Cardiovascular:     Rate and Rhythm: Normal rate and regular rhythm.     Pulses: Normal pulses.     Heart sounds: Normal heart sounds. No murmur. No friction rub. No gallop.   Pulmonary:     Effort: Pulmonary effort is normal.     Breath sounds: Normal breath sounds. No wheezing, rhonchi or rales.  Abdominal:     General: Bowel sounds are normal.     Palpations: Abdomen is soft.     Tenderness: There is no abdominal tenderness. There is no guarding.  Musculoskeletal:        General: No swelling.     Right lower leg: No edema.     Left lower leg: No edema.     Comments: RUE and RLE weakness on examination.   Neurological:     Mental Status: He is alert and oriented to person, place, and time.  Psychiatric:        Mood and Affect: Mood normal.        Behavior: Behavior normal.     CBC Latest Ref Rng & Units 03/26/2019 03/12/2019 03/08/2019  WBC 4.0 - 10.5 K/uL 5.1 5.6 5.8    Hemoglobin 13.0 - 17.0 g/dL 11.0(L) 10.9(L) 10.4(L)  Hematocrit 39.0 - 52.0 % 34.5(L) 34.8(L) 33.0(L)  Platelets 150 - 400 K/uL 224 259 290   BMP Latest Ref Rng & Units 03/26/2019 03/12/2019 03/04/2019  Glucose 70 - 99 mg/dL 103(H) 98 101(H)  BUN 6 - 20 mg/dL 25(H) 24(H) 22(H)  Creatinine 0.61 - 1.24 mg/dL 1.47(H) 1.34(H) 1.32(H)  Sodium 135 - 145 mmol/L 142 138 140  Potassium 3.5 - 5.1 mmol/L 4.2 4.1 4.1  Chloride 98 - 111 mmol/L 106 105 106  CO2 22 - 32 mmol/L 15(L) 23 26  Calcium 8.9 - 10.3 mg/dL 9.5 9.6 9.4    Assessment/Plan:  Principal Problem:   Cerebral thrombosis with cerebral infarction Active Problems:   Right sided weakness   Hypertension   Hyperlipidemia   Ulcer of penis   Urinary retention   Depression   Hematuria   Anemia of chronic disease   Acute blood loss anemia  Philip Vasquez is a 58 y/o male and used cocaine daily, with no prior medical history  presented to the [facility] with AMS and R sided weakness. Imaging showed ACA/MCA watershed infarcts.   1. ACA/MCA Stroke: - Continue aspirin and statin - Continue Baclofen 5 mg BID - Continue PT/OT/ST  2. Hypertension: - Continue amlodipine 10 QD - Continue hydralazine 75 TID - Continue losartan 50 mg QD - Continue metoprolol tartrate 25 BID  3. Depression: -Continue Prozac 60 mg QD -Continue mirtazapine at current dose  4. Anion Gap Metabolic Acidosis:  - BMP shows anion gap of 21, ABG ordered. Patient is stable and asymptomatic on examination.  - ABG: Low arterial saturation (63.6) with a pCO2 of 36.6 and pH of 7.45. - Will continue to monitor.    Urinary Retention (Resolved) -Condom cath intact -Continue flomax 0.8 QD   Dispo: Anticipated discharge pending SNF placement.   Dolan Amen, MD IMTS, PGY-1 Pager: [phone] 03/26/2019,7:07 AM

## 2019-03-27 LAB — BLOOD GAS, ARTERIAL
Acid-Base Excess: 1.9 mmol/L (ref 0.0–2.0)
Bicarbonate: 25.8 mmol/L (ref 20.0–28.0)
FIO2: 21
O2 Saturation: 93 %
Patient temperature: 37
pCO2 arterial: 38.8 mmHg (ref 32.0–48.0)
pH, Arterial: 7.438 (ref 7.350–7.450)
pO2, Arterial: 67.3 mmHg — ABNORMAL LOW (ref 83.0–108.0)

## 2019-03-27 LAB — BASIC METABOLIC PANEL
Anion gap: 10 (ref 5–15)
BUN: 23 mg/dL — ABNORMAL HIGH (ref 6–20)
CO2: 25 mmol/L (ref 22–32)
Calcium: 9.5 mg/dL (ref 8.9–10.3)
Chloride: 103 mmol/L (ref 98–111)
Creatinine, Ser: 1.46 mg/dL — ABNORMAL HIGH (ref 0.61–1.24)
GFR calc Af Amer: >60 mL/min (ref >60)
GFR calc non Af Amer: 52 mL/min — ABNORMAL LOW (ref >60)
Glucose, Bld: 93 mg/dL (ref 70–99)
Potassium: 4 mmol/L (ref 3.5–5.1)
Sodium: 138 mmol/L (ref 135–145)

## 2019-03-27 NOTE — Progress Notes (Signed)
Subjective:  [name] was examined at bedside this morning and he reports that he is doing well. He denies nausea, vomiting and any complaints whatsoever.   Objective:  Vital signs in last 24 hours: Vitals:   03/26/19 1520 03/26/19 1934 03/27/19 0004 03/27/19 0307  BP: 122/90 (!) 115/92 131/80 115/79  Pulse: 62 74 69 64  Resp: 15 16 16 16   Temp: 97.9 F (36.6 C) 98 F (36.7 C) 98.2 F (36.8 C) 98.6 F (37 C)  TempSrc: Oral Oral Oral Oral  SpO2: 96% 98% 99% 97%  Weight:      Height:      Physical Exam Constitutional:      General: He is not in acute distress.    Appearance: He is normal weight. He is not ill-appearing or toxic-appearing.  HENT:     Head: Normocephalic and atraumatic.  Cardiovascular:     Rate and Rhythm: Normal rate and regular rhythm.     Pulses: Normal pulses.     Heart sounds: Normal heart sounds. No murmur. No friction rub. No gallop.   Pulmonary:     Effort: Pulmonary effort is normal.     Breath sounds: Normal breath sounds. No wheezing, rhonchi or rales.  Abdominal:     General: Bowel sounds are normal. There is no distension.     Palpations: Abdomen is soft.     Tenderness: There is no abdominal tenderness. There is no rebound.  Musculoskeletal:     Comments: Weakness in the RUE and RLE.   Neurological:     Mental Status: He is alert and oriented to person, place, and time.     ABG    Component Value Date/Time   PHART 7.438 03/27/2019 0407   PCO2ART 38.8 03/27/2019 0407   PO2ART 67.3 (L) 03/27/2019 0407   HCO3 25.8 03/27/2019 0407   TCO2 21 (L) 12/19/2018 0815   O2SAT 93.0 03/27/2019 0407   BMP Latest Ref Rng & Units 03/27/2019 03/26/2019 03/12/2019  Glucose 70 - 99 mg/dL 93 14/07/2018) 98  BUN 6 - 20 mg/dL 202(R) 42(H) 06(C)  Creatinine 0.61 - 1.24 mg/dL 37(S) 2.83(T) 5.17(O)  Sodium 135 - 145 mmol/L 138 142 138  Potassium 3.5 - 5.1 mmol/L 4.0 4.2 4.1  Chloride 98 - 111 mmol/L 103 106 105  CO2 22 - 32 mmol/L 25 15(L) 23  Calcium  8.9 - 10.3 mg/dL 9.5 9.5 9.6    Assessment/Plan:  Principal Problem:   Cerebral thrombosis with cerebral infarction Active Problems:   Right sided weakness   Hypertension   Hyperlipidemia   Ulcer of penis   Urinary retention   Depression   Hematuria   Anemia of chronic disease   Acute blood loss anemia  [name] and used cocaine daily, with no prior medical history presented to the [facility] with AMS and R sided weakness. Imaging showed ACA/MCA watershed infarcts.   1. ACA/MCA Stroke: - Continue aspirin and statin - Continue Baclofen 5 mg BID - Continue PT/OT/ST  2. Hypertension: - Continue amlodipine 10 QD - Continue hydralazine 75 TID - Continue losartan 50 mg QD - Continue metoprolol tartrate 25 BID  3. Depression: -Continue Prozac 60 mg QD -Continue mirtazapine at current dose   Anion Gap Metabolic Acidosis (Resolved):  - AM ABG shows resolution.   Urinary Retention (Resolved) - Condom cath intact - Continue flomax 0.8 QD  - AM bladder scan shows 92 mL   Dispo: Anticipated discharge pending SNF placement.  Maudie Mercury, MD IMTS, PGY-1 Pager: [phone] 03/27/2019,6:24 AM

## 2019-03-28 NOTE — Progress Notes (Signed)
   Subjective:  Philip Vasquez was seen and evaluated at bedside this morning. Patient was asleep, but aroused to vocal stimuli. He states that he is doing fine, with no complaints at this time.   Objective:  Vital signs in last 24 hours: Vitals:   03/27/19 1611 03/27/19 1953 03/27/19 2307 03/28/19 0403  BP: 135/90 128/63 132/82 (!) 145/79  Pulse: 68 85 75 86  Resp: 16 15 16 18   Temp: 98.3 F (36.8 C) 98.3 F (36.8 C) 98.4 F (36.9 C) 98.5 F (36.9 C)  TempSrc: Oral Oral Oral Oral  SpO2: 95% 100% 100% 97%  Weight:      Height:      Physical Exam Vitals and nursing note reviewed.  Constitutional:      General: He is not in acute distress.    Appearance: Normal appearance. He is not ill-appearing or toxic-appearing.  Cardiovascular:     Rate and Rhythm: Normal rate and regular rhythm.     Pulses: Normal pulses.     Heart sounds: Normal heart sounds. No murmur. No friction rub. No gallop.   Pulmonary:     Effort: Pulmonary effort is normal.     Breath sounds: Normal breath sounds. No wheezing, rhonchi or rales.  Abdominal:     General: Bowel sounds are normal.     Palpations: Abdomen is soft.     Tenderness: There is no abdominal tenderness. There is no guarding.  Musculoskeletal:        General: No swelling.     Right lower leg: No edema.     Left lower leg: No edema.     Comments: RUE and RLE weakness appreciated.   Neurological:     General: No focal deficit present.     Mental Status: He is alert and oriented to person, place, and time.  Psychiatric:        Mood and Affect: Mood normal.        Behavior: Behavior normal.    Assessment/Plan:  Principal Problem:   Cerebral thrombosis with cerebral infarction Active Problems:   Right sided weakness   Hypertension   Hyperlipidemia   Ulcer of penis   Urinary retention   Depression   Hematuria   Anemia of chronic disease   Acute blood loss anemia  Philip Vasquez is a 58 y/o male and used cocaine daily, with no prior  medical history presented to the [facility] with AMS and R sided weakness. Imaging showed ACA/MCA watershed infarcts.   1. ACA/MCA Stroke: - Continue aspirin and statin - Continue Baclofen 5 mg BID - Continue PT/OT/ST  2. Hypertension: - Continue amlodipine 10 QD - Continue hydralazine 75 TID - Continue losartan 50 mg QD - Continue metoprolol tartrate 25 BID  3. Depression: -Continue Prozac 60 mg QD -Continue mirtazapine at current dose   Urinary Retention (Resolved) - Condom cath intact - Continue flomax 0.8 QD   Dispo: Anticipated discharge pending SNF placement.   Maudie Mercury, MD IMTS, PGY-1 Pager: [phone] 03/28/2019,5:45 AM

## 2019-03-29 ENCOUNTER — Inpatient Hospital Stay (HOSPITAL_COMMUNITY): Payer: Medicaid Other

## 2019-03-29 DIAGNOSIS — D72829 Elevated white blood cell count, unspecified: Secondary | ICD-10-CM

## 2019-03-29 DIAGNOSIS — I63529 Cerebral infarction due to unspecified occlusion or stenosis of unspecified anterior cerebral artery: Secondary | ICD-10-CM

## 2019-03-29 DIAGNOSIS — I63519 Cerebral infarction due to unspecified occlusion or stenosis of unspecified middle cerebral artery: Secondary | ICD-10-CM

## 2019-03-29 DIAGNOSIS — R509 Fever, unspecified: Secondary | ICD-10-CM

## 2019-03-29 LAB — URINALYSIS, ROUTINE W REFLEX MICROSCOPIC
Bilirubin Urine: NEGATIVE
Glucose, UA: NEGATIVE mg/dL
Hgb urine dipstick: NEGATIVE
Ketones, ur: NEGATIVE mg/dL
Nitrite: NEGATIVE
Protein, ur: 30 mg/dL — AB
Specific Gravity, Urine: 1.023 (ref 1.005–1.030)
WBC, UA: >50 WBC/hpf — ABNORMAL HIGH (ref 0–5)
pH: 5 (ref 5.0–8.0)

## 2019-03-29 LAB — CBC WITH DIFFERENTIAL/PLATELET
Abs Immature Granulocytes: 0.03 10*3/uL (ref 0.00–0.07)
Basophils Absolute: 0 10*3/uL (ref 0.0–0.1)
Basophils Relative: 0 %
Eosinophils Absolute: 0 10*3/uL (ref 0.0–0.5)
Eosinophils Relative: 0 %
HCT: 35.3 % — ABNORMAL LOW (ref 39.0–52.0)
Hemoglobin: 11.7 g/dL — ABNORMAL LOW (ref 13.0–17.0)
Immature Granulocytes: 0 %
Lymphocytes Relative: 9 %
Lymphs Abs: 1.1 10*3/uL (ref 0.7–4.0)
MCH: 28.1 pg (ref 26.0–34.0)
MCHC: 33.1 g/dL (ref 30.0–36.0)
MCV: 84.7 fL (ref 80.0–100.0)
Monocytes Absolute: 0.8 10*3/uL (ref 0.1–1.0)
Monocytes Relative: 7 %
Neutro Abs: 9.4 10*3/uL — ABNORMAL HIGH (ref 1.7–7.7)
Neutrophils Relative %: 84 %
Platelets: 215 10*3/uL (ref 150–400)
RBC: 4.17 MIL/uL — ABNORMAL LOW (ref 4.22–5.81)
RDW: 13.8 % (ref 11.5–15.5)
WBC: 11.4 10*3/uL — ABNORMAL HIGH (ref 4.0–10.5)
nRBC: 0 % (ref 0.0–0.2)

## 2019-03-29 LAB — PROCALCITONIN: Procalcitonin: 1.14 ng/mL

## 2019-03-29 LAB — LACTIC ACID, PLASMA
Lactic Acid, Venous: 1.3 mmol/L (ref 0.5–1.9)
Lactic Acid, Venous: 1.3 mmol/L (ref 0.5–1.9)

## 2019-03-29 LAB — SARS CORONAVIRUS 2 (TAT 6-24 HRS): SARS Coronavirus 2: NEGATIVE

## 2019-03-29 MED ORDER — VANCOMYCIN HCL 2000 MG/400ML IV SOLN
2000.0000 mg | INTRAVENOUS | Status: DC
  Filled 2019-03-29: qty 400

## 2019-03-29 MED ORDER — VANCOMYCIN HCL 1750 MG/350ML IV SOLN: 1750.0000 mg | INTRAVENOUS | Status: DC

## 2019-03-29 MED ORDER — SODIUM CHLORIDE 0.9 % IV BOLUS
1000.0000 mL | Freq: Once | INTRAVENOUS | Status: AC
  Administered 2019-03-29: 1000 mL via INTRAVENOUS

## 2019-03-29 MED ORDER — SODIUM CHLORIDE 0.9 % IV SOLN
2.0000 g | Freq: Three times a day (TID) | INTRAVENOUS | Status: DC
  Administered 2019-03-29 – 2019-03-30 (×2): 2 g via INTRAVENOUS
  Filled 2019-03-29 (×2): qty 2

## 2019-03-29 MED ORDER — VANCOMYCIN HCL 10 G IV SOLR
2250.0000 mg | Freq: Once | INTRAVENOUS | Status: AC
  Administered 2019-03-29: 2250 mg via INTRAVENOUS
  Filled 2019-03-29: qty 2000

## 2019-03-29 MED ORDER — SODIUM CHLORIDE 0.9 % IV SOLN: INTRAVENOUS | Status: AC

## 2019-03-29 MED ORDER — SODIUM CHLORIDE 0.9 % IV SOLN
2.0000 g | INTRAVENOUS | Status: DC
  Administered 2019-03-29: 2 g via INTRAVENOUS
  Filled 2019-03-29: qty 20
  Filled 2019-03-29: qty 2

## 2019-03-29 NOTE — Progress Notes (Signed)
Occupational Therapy Treatment Patient Details Name: Philip Vasquez MRN: [id number] DOB: 11/30/1960 Today's Date: 03/29/2019    History of present illness Philip Vasquez is a 58 year old gentleman with no past medical history who presented with AMS and right-sided weakness. MRI: Widely scattered small acute infarcts in the medial lefthemisphere involving left ACA and left ACA/MCA watershed area.   OT comments  Pt received in bed soiled, required totalA for posterior pericare and minA for rolling in bed. Pt required modA+2 to progress to EOB. Pt with significant self limiting behaviors demonstrated this session. Used sara plus to stand pt, who quickly became resistive and agitated with use of equipment, limiting participation during session. He did stand for 5 min with sara plus demonstrating increased posterior lean. Pt will continue to benefit from skilled OT services to maximize safety and independence with ADL/IADL and functional mobility. Will continue to follow acutely and progress as tolerated.    Follow Up Recommendations  SNF;Supervision/Assistance - 24 hour    Equipment Recommendations  3 in 1 bedside commode    Recommendations for Other Services Rehab consult    Precautions / Restrictions Precautions Precautions: Fall Precaution Comments: R UE/LE weakness/tone Restrictions Weight Bearing Restrictions: No       Mobility Bed Mobility Overal bed mobility: Needs Assistance Bed Mobility: Supine to Sit;Sit to Supine;Rolling Rolling: Min assist   Supine to sit: Mod assist;HOB elevated;+2 for safety/equipment;+2 for physical assistance Sit to supine: Mod assist;+2 for safety/equipment   General bed mobility comments: modA to progress trunk to upright posture;assist for BLE managmenet  Transfers Overall transfer level: Needs assistance Equipment used: Ambulation equipment used Transfers: Sit to/from Stand Sit to Stand: Max assist;+2 physical assistance;+2 safety/equipment          General transfer comment: pt with increasing resitance and escalating agitation with use of sara plus;He did stand in sara plus for 5 min    Balance Overall balance assessment: Needs assistance Sitting-balance support: Feet supported;Bilateral upper extremity supported Sitting balance-Leahy Scale: Poor Sitting balance - Comments: minA initially progressing to modA, pt with increasing posterior lean;appeared fearful of falling Postural control: Posterior lean Standing balance support: Bilateral upper extremity supported Standing balance-Leahy Scale: Zero Standing balance comment: pt with significant posterior lean as he was resisting compliance with safe use of sara lift;became agitated when prompted to bring his LUE from bed rail to sara+ handles                           ADL either performed or assessed with clinical judgement   ADL Overall ADL's : Needs assistance/impaired                     Lower Body Dressing: Maximal assistance;Sitting/lateral leans   Toilet Transfer: Total assistance;Maximal assistance;+2 for physical assistance Toilet Transfer Details (indicate cue type and reason): pt soiled upon arrival totalA for pericare;use of sara lift to transfer pt;pt resistive to standing Toileting- Clothing Manipulation and Hygiene: Total assistance Toileting - Clothing Manipulation Details (indicate cue type and reason): totalA for posterior pericare     Functional mobility during ADLs: Maximal assistance;+2 for physical assistance;+2 for safety/equipment;Cueing for safety General ADL Comments: pt required modA for sitting EOB;pt resistive to standing this session, demonstrating increased posterior lean      Vision       Perception     Praxis      Cognition Arousal/Alertness: Awake/alert Behavior During Therapy: Flat affect Overall Cognitive Status: Impaired/Different from  baseline Area of Impairment: Following  commands;Safety/judgement;Awareness;Problem solving                       Following Commands: Follows one step commands with increased time;Follows multi-step commands inconsistently Safety/Judgement: Decreased awareness of safety;Decreased awareness of deficits Awareness: Intellectual Problem Solving: Difficulty sequencing;Requires verbal cues;Requires tactile cues;Slow processing General Comments: Pt with self-limiting behaviors pt agitated when encouraged to get out of bed;pt resisting all movements despite max encouragement by therapists;pt appeares to be fearful of falling, but when asked responds "no";unsure accuracy of yes/no answers as pt will respond with one answer shaking head to signal the opposite answer        Exercises     Shoulder Instructions       General Comments      Pertinent Vitals/ Pain       Pain Assessment: Faces Faces Pain Scale: Hurts even more Pain Location: RUE with movement Pain Descriptors / Indicators: Grimacing;Guarding Pain Intervention(s): Limited activity within patient's tolerance;Monitored during session  Home Living                                          Prior Functioning/Environment              Frequency  Min 2X/week        Progress Toward Goals  OT Goals(current goals can now be found in the care plan section)  Progress towards OT goals: Not progressing toward goals - comment(pt with self-limiting behaviors)  Acute Rehab OT Goals Patient Stated Goal: pt did not state OT Goal Formulation: With patient Time For Goal Achievement: 04/12/19 Potential to Achieve Goals: Fair ADL Goals Pt Will Perform Grooming: with set-up;sitting Pt Will Perform Upper Body Bathing: with set-up;sitting Pt Will Transfer to Toilet: stand pivot transfer;bedside commode;with mod assist Pt/caregiver will Perform Home Exercise Program: Increased ROM;Right Upper extremity;With written HEP provided Additional ADL Goal #1:  Pt will be able to roll left and right with min A to help with basic ADLs Additional ADL Goal #2: Pt will be able to come up to sit EOB with min A in prep for transfers and EOB ADLs Additional ADL Goal #3: Pt will scan environment for appropriate items for functional tasks with minimal cueing.  Plan Discharge plan remains appropriate    Co-evaluation    PT/OT/SLP Co-Evaluation/Treatment: Yes Reason for Co-Treatment: Complexity of the patient's impairments (multi-system involvement);For patient/therapist safety;To address functional/ADL transfers   OT goals addressed during session: ADL's and self-care      AM-PAC OT "6 Clicks" Daily Activity     Outcome Measure   Help from another person eating meals?: A Little Help from another person taking care of personal grooming?: A Lot Help from another person toileting, which includes using toliet, bedpan, or urinal?: Total Help from another person bathing (including washing, rinsing, drying)?: A Lot Help from another person to put on and taking off regular upper body clothing?: A Little Help from another person to put on and taking off regular lower body clothing?: A Lot 6 Click Score: 13    End of Session Equipment Utilized During Treatment: Gait belt;Other (comment)(sara lift)  OT Visit Diagnosis: Hemiplegia and hemiparesis;Other symptoms and signs involving cognitive function Symptoms and signs involving cognitive functions: Cerebral infarction Hemiplegia - Right/Left: Right Hemiplegia - dominant/non-dominant: Dominant Hemiplegia - caused by: Cerebral infarction   Activity Tolerance Treatment  limited secondary to agitation;Patient tolerated treatment well   Patient Left in bed;with call bell/phone within reach;with bed alarm set   Nurse Communication Mobility status        Time: 1428-1500 OT Time Calculation (min): 32 min  Charges: OT General Charges $OT Visit: 1 Visit OT Treatments $Self Care/Home Management : 8-22  mins  Dorinda Hill OTR/L [place] Office: [place] 03/29/2019, 4:03 PM

## 2019-03-29 NOTE — Progress Notes (Signed)
Nutrition Brief Note   Wt Readings from Last 15 Encounters:  03/26/19 132 kg    Body mass index is 42.97 kg/m. Patient meets criteria for morbid obesity based on current BMI.   Current diet order is regular diet with thin liquids, patient is consuming approximately 100% of meals at this time. Labs and medications reviewed. Pt currently has Ensure and Prostat ordered and has been consuming them.Intake has been adequate. Pt medically stable for discharge, however difficult disposition. Social work and case management are working to obtain appropriate post hospitalization disposition. No further nutrition interventions warranted at this time. If nutrition issues arise, please consult RD.   Corrin Parker, MS, RD, LDN Pager # [phone] After hours/ weekend pager # [phone]

## 2019-03-29 NOTE — Progress Notes (Signed)
Pharmacy Antibiotic Note  Philip Vasquez is a 58 y.o. male admitted on 12/19/2018 with stroke.  Pharmacy has been consulted for Vancomycin dosing for bacteremia.  WBC increased (5.1>>11.4). Noted renal dysfunction. Previous citrobacter bacteremia.   Plan: Vancomycin 2000 mg IV q24h >>Estimated AUC: 458 Ceftriaxone re-started per MD Trend WBC, temp, renal function  F/U infectious work-up Drug levels as indicated   Height: 5\' 9"  (175.3 cm) Weight: 291 lb 0.1 oz (132 kg) IBW/kg (Calculated) : 70.7  Temp (24hrs), Avg:99.8 F (37.7 C), Min:97.5 F (36.4 C), Max:102.6 F (39.2 C)  Recent Labs  Lab 03/26/19 0233 03/26/19 1103 03/27/19 0308 03/29/19 0152  WBC 5.1  --   --  11.4*  CREATININE 1.47*  --  1.46*  --   LATICACIDVEN  --  1.1  --   --     Estimated Creatinine Clearance: 74.3 mL/min (A) (by C-G formula based on SCr of 1.46 mg/dL (H)).    No Known Allergies  Narda Bonds, PharmD, BCPS Clinical Pharmacist Phone: [phone]

## 2019-03-29 NOTE — Progress Notes (Signed)
Patient no longer needs PIV started RN on the until was successful

## 2019-03-29 NOTE — Progress Notes (Signed)
Pharmacy Antibiotic Note  Philip Vasquez is a 58 y.o. male admitted on 12/19/2018 with stroke.  Pharmacy has been consulted for Vancomycin dosing for bacteremia.  WBC increased (5.1>>11.4), with fever up to 102 overnight (currently 101.80F). Scr 1.46, CrCl 74.3 ml/min. Previous Citrobacter youngae UTI, treated with ceftriaxone during this admission.  Medical hx includes: HTN, HLD, penile ulcer, urinary retention, depression, hematuria, anemia of chronic disease, acute blood loss anemia  Plan: Cefepime 2 gm IV Q 8 hrs Vancomycin 2250 mg IV X 1, followed by vancomycin 1750 mg IV Q 24 hrs (estimated vancomycin AUC on this regimen, using Scr 1.46, is 528.5; goal vancomycin AUC is 400-550) Monitor renal function, WBC, temp, clinical improvement, cultures, vancomycin levels as indicated  Height: 5\' 9"  (175.3 cm) Weight: 291 lb 0.1 oz (132 kg) IBW/kg (Calculated) : 70.7  Temp (24hrs), Avg:100.1 F (37.8 C), Min:98 F (36.7 C), Max:102.6 F (39.2 C)  Recent Labs  Lab 03/26/19 0233 03/26/19 1103 03/27/19 0308 03/29/19 0152 03/29/19 0838 03/29/19 1127  WBC 5.1  --   --  11.4*  --   --   CREATININE 1.47*  --  1.46*  --   --   --   LATICACIDVEN  --  1.1  --   --  1.3 1.3    Estimated Creatinine Clearance: 74.3 mL/min (A) (by C-G formula based on SCr of 1.46 mg/dL (H)).    No Known Allergies   Antimicrobials This Admission: 11/10 - 11/17, 12/21: ceftriaxone  Recent Microbiology Results: 11/10 Urine cx: Citrobacter youngae: treated with ceftriaxone 12/21 Bld cx X 2: pending 12/21 Urine cx: pending   Gillermina Hu, PharmD, BCPS, [facility] Clinical Pharmacist 03/29/19, 19:37 PM

## 2019-03-29 NOTE — Progress Notes (Signed)
Subjective: HD#100 Events Overnight: Patient developed a fever over night.   Patient was seen this morning on rounds. He states that he has had some fever and dry cough over night, but denies any other symptoms. He denies chest pain, abdominal pain, shortness of breath, problems urinating or defecating.   Objective:  Vital signs in last 24 hours: Vitals:   03/28/19 1936 03/28/19 2353 03/29/19 0055 03/29/19 0343  BP: 119/69 133/87  131/80  Pulse: 89 85  82  Resp: 18 18  18   Temp: 99.3 F (37.4 C) (!) 102.6 F (39.2 C) 99.7 F (37.6 C) (!) 100.4 F (38 C)  TempSrc: Oral Oral Oral Oral  SpO2: 98% 97%  100%  Weight:      Height:        Physical Exam: Physical Exam  Constitutional: He is oriented to person, place, and time. No distress.  HENT:  Head: Normocephalic and atraumatic.  Eyes: EOM are normal.  Cardiovascular: Normal rate, regular rhythm, normal heart sounds and intact distal pulses. Exam reveals no gallop and no friction rub.  No murmur heard. Pulmonary/Chest: Effort normal and breath sounds normal. No respiratory distress. He exhibits no tenderness.  Abdominal: Soft. Bowel sounds are normal. He exhibits no distension. There is no abdominal tenderness.  Musculoskeletal:        General: No tenderness or edema. Normal range of motion.     Cervical back: Normal range of motion.  Neurological: He is alert and oriented to person, place, and time.  Skin: Skin is warm and dry. He is not diaphoretic.    Filed Weights   03/16/19 0342 03/24/19 0414 03/26/19 0504  Weight: 135 kg 133 kg 132 kg     Intake/Output Summary (Last 24 hours) at 03/29/2019 0542 Last data filed at 03/29/2019 0001 Gross per 24 hour  Intake 240 ml  Output 400 ml  Net -160 ml    Pertinent labs/Imaging: CBC Latest Ref Rng & Units 03/29/2019 03/26/2019 03/12/2019  WBC 4.0 - 10.5 K/uL 11.4(H) 5.1 5.6  Hemoglobin 13.0 - 17.0 g/dL 11.7(L) 11.0(L) 10.9(L)  Hematocrit 39.0 - 52.0 % 35.3(L) 34.5(L)  34.8(L)  Platelets 150 - 400 K/uL 215 224 259    CMP Latest Ref Rng & Units 03/27/2019 03/26/2019 03/12/2019  Glucose 70 - 99 mg/dL 93 103(H) 98  BUN 6 - 20 mg/dL 23(H) 25(H) 24(H)  Creatinine 0.61 - 1.24 mg/dL 1.46(H) 1.47(H) 1.34(H)  Sodium 135 - 145 mmol/L 138 142 138  Potassium 3.5 - 5.1 mmol/L 4.0 4.2 4.1  Chloride 98 - 111 mmol/L 103 106 105  CO2 22 - 32 mmol/L 25 15(L) 23  Calcium 8.9 - 10.3 mg/dL 9.5 9.5 9.6  Total Protein 6.5 - 8.1 g/dL - - -  Total Bilirubin 0.3 - 1.2 mg/dL - - -  Alkaline Phos 38 - 126 U/L - - -  AST 15 - 41 U/L - - -  ALT 0 - 44 U/L - - -    No results found.  Assessment/Plan:  Principal Problem:   Cerebral thrombosis with cerebral infarction Active Problems:   Right sided weakness   Hypertension   Hyperlipidemia   Ulcer of penis   Urinary retention   Depression   Hematuria   Anemia of chronic disease   Acute blood loss anemia    Patient Summary: Philip Vasquez is a 58 y.o. with no pertinent PMH, admit for AMS with Rt. Sided weakness 2/2 to ACA/MCA watershed CVA on hospital day 100  Fever  Leukocytosis - fever up to 102 overnight - leukocytosis of 11.4 - Procal: 1.14 - LA 1.3 - UA pending  - Covid Test pending - Blood cultures pending - Continue Ceftriaxone pending infectious work up   ACA/MCA Stroke: - ASA and statin  - Baclofen 5 mg BID - PT/OT/ST   Hypertension: - Continue anti-hypertensive meds  Urinary Retention (Resolved) - Condom cath. - Continue flow max  Diet: heart health IVF: none VTE: enoxaparin  Code: Full  Dispo: Anticipated discharge pending clinical improvement.    Dellia Cloud, D.O. MCIMTP, PGY-1 Date 03/29/2019 Time 5:42 AM Pager: [phone]

## 2019-03-29 NOTE — Progress Notes (Signed)
Physical Therapy Treatment Patient Details Name: Philip Vasquez MRN: [id number] DOB: Apr 14, 1960 Today's Date: 03/29/2019    History of Present Illness Mr. Azbill is a 58 year old gentleman with no past medical history who presented with AMS and right-sided weakness. MRI: Widely scattered small acute infarcts in the medial lefthemisphere involving left ACA and left ACA/MCA watershed area.    PT Comments    Pt supine in bed on arrival, soiled in stool.  Performed rolling and pericare.  Once in sitting patient became feaful of falling.  When RW placed in front he reports, no but then later would agree.  His communication is impaired and difficult to follow.  For safety utilized sara + lift to stand and reposition patient from sliding.  Once in standing he refused to progress steps to the chair and became agitated.  Pt returned to bed and alarm applied to bed.  He continues to self limit progress.  Continue to recommend snf placement.      Follow Up Recommendations  SNF;Supervision/Assistance - 24 hour     Equipment Recommendations  Wheelchair (measurements PT);Wheelchair cushion (measurements PT);Hospital bed;Other (comment)    Recommendations for Other Services       Precautions / Restrictions Precautions Precautions: Fall Precaution Comments: R UE/LE weakness/tone Restrictions Weight Bearing Restrictions: No    Mobility  Bed Mobility Overal bed mobility: Needs Assistance Bed Mobility: Supine to Sit;Sit to Supine;Rolling Rolling: Min assist   Supine to sit: Mod assist;HOB elevated;+2 for safety/equipment;+2 for physical assistance Sit to supine: Mod assist;+2 for safety/equipment   General bed mobility comments: modA to progress trunk to upright posture;assist for BLE managment, pushing with posterior bias,  Transfers Overall transfer level: Needs assistance Equipment used: Ambulation equipment used(sara +) Transfers: Sit to/from Stand Sit to Stand: Max assist;+2 physical  assistance;+2 safety/equipment;Total assist         General transfer comment: pt with increasing resitance and escalating agitation with use of sara plus;He did stand in sara plus for 5 min, required repeated trial to stand due to bowel incontinence and need for pad change to return back to bed.  Ambulation/Gait                 Stairs             Wheelchair Mobility    Modified Rankin (Stroke Patients Only)       Balance Overall balance assessment: Needs assistance Sitting-balance support: Feet supported;Bilateral upper extremity supported Sitting balance-Leahy Scale: Poor Sitting balance - Comments: minA initially progressing to modA, pt with increasing posterior lean;appeared fearful of falling Postural control: Posterior lean Standing balance support: Bilateral upper extremity supported Standing balance-Leahy Scale: Zero Standing balance comment: pt with significant posterior lean as he was resisting compliance with safe use of sara lift;became agitated when prompted to bring his LUE from bed rail to sara+ handles                            Cognition Arousal/Alertness: Awake/alert Behavior During Therapy: Flat affect;Agitated Overall Cognitive Status: Impaired/Different from baseline Area of Impairment: Following commands;Safety/judgement;Awareness;Problem solving                       Following Commands: Follows one step commands with increased time;Follows multi-step commands inconsistently Safety/Judgement: Decreased awareness of safety;Decreased awareness of deficits Awareness: Intellectual Problem Solving: Difficulty sequencing;Requires verbal cues;Requires tactile cues;Slow processing General Comments: Pt with self-limiting behaviors pt agitated when encouraged to get  out of bed;pt resisting all movements despite max encouragement by therapists;pt appeares to be fearful of falling, but when asked responds "no";unsure accuracy of  yes/no answers as pt will respond with one answer shaking head to signal the opposite answer      Exercises      General Comments        Pertinent Vitals/Pain Pain Assessment: Faces Faces Pain Scale: Hurts even more Pain Location: RUE with movement Pain Descriptors / Indicators: Grimacing;Guarding Pain Intervention(s): Monitored during session;Repositioned    Home Living                      Prior Function            PT Goals (current goals can now be found in the care plan section) Acute Rehab PT Goals Patient Stated Goal: pt did not state Progress towards PT goals: Not progressing toward goals - comment(self limiting behavior)    Frequency    Min 1X/week      PT Plan Current plan remains appropriate    Co-evaluation   Reason for Co-Treatment: Complexity of the patient's impairments (multi-system involvement);For patient/therapist safety;To address functional/ADL transfers   OT goals addressed during session: ADL's and self-care      AM-PAC PT "6 Clicks" Mobility   Outcome Measure  Help needed turning from your back to your side while in a flat bed without using bedrails?: Total Help needed moving from lying on your back to sitting on the side of a flat bed without using bedrails?: Total Help needed moving to and from a bed to a chair (including a wheelchair)?: Total Help needed standing up from a chair using your arms (e.g., wheelchair or bedside chair)?: Total Help needed to walk in hospital room?: Total Help needed climbing 3-5 steps with a railing? : Total 6 Click Score: 6    End of Session Equipment Utilized During Treatment: Gait belt   Patient left: in bed;with bed alarm set;with call bell/phone within reach Nurse Communication: Mobility status PT Visit Diagnosis: Other abnormalities of gait and mobility (R26.89);Hemiplegia and hemiparesis;Muscle weakness (generalized) (M62.81) Hemiplegia - Right/Left: Right Hemiplegia -  dominant/non-dominant: Dominant Hemiplegia - caused by: Cerebral infarction     Time: 1429-1501 PT Time Calculation (min) (ACUTE ONLY): 32 min  Charges:  $Therapeutic Activity: 8-22 mins                     Bonney Leitz , PTA Acute Rehabilitation Services Pager [phone] Office [phone]     Dameka Younker Artis Delay 03/29/2019, 6:26 PM

## 2019-03-30 DIAGNOSIS — N390 Urinary tract infection, site not specified: Secondary | ICD-10-CM

## 2019-03-30 DIAGNOSIS — Z960 Presence of urogenital implants: Secondary | ICD-10-CM

## 2019-03-30 LAB — CBC WITH DIFFERENTIAL/PLATELET
Abs Immature Granulocytes: 0.04 10*3/uL (ref 0.00–0.07)
Basophils Absolute: 0 10*3/uL (ref 0.0–0.1)
Basophils Relative: 0 %
Eosinophils Absolute: 0.1 10*3/uL (ref 0.0–0.5)
Eosinophils Relative: 1 %
HCT: 32.5 % — ABNORMAL LOW (ref 39.0–52.0)
Hemoglobin: 10.4 g/dL — ABNORMAL LOW (ref 13.0–17.0)
Immature Granulocytes: 0 %
Lymphocytes Relative: 9 %
Lymphs Abs: 0.9 10*3/uL (ref 0.7–4.0)
MCH: 27.7 pg (ref 26.0–34.0)
MCHC: 32 g/dL (ref 30.0–36.0)
MCV: 86.4 fL (ref 80.0–100.0)
Monocytes Absolute: 0.8 10*3/uL (ref 0.1–1.0)
Monocytes Relative: 8 %
Neutro Abs: 7.9 10*3/uL — ABNORMAL HIGH (ref 1.7–7.7)
Neutrophils Relative %: 82 %
Platelets: 189 10*3/uL (ref 150–400)
RBC: 3.76 MIL/uL — ABNORMAL LOW (ref 4.22–5.81)
RDW: 14.2 % (ref 11.5–15.5)
WBC: 9.8 10*3/uL (ref 4.0–10.5)
nRBC: 0 % (ref 0.0–0.2)

## 2019-03-30 LAB — URINE CULTURE

## 2019-03-30 LAB — COMPREHENSIVE METABOLIC PANEL
ALT: 39 U/L (ref 0–44)
AST: 28 U/L (ref 15–41)
Albumin: 3.1 g/dL — ABNORMAL LOW (ref 3.5–5.0)
Alkaline Phosphatase: 59 U/L (ref 38–126)
Anion gap: 10 (ref 5–15)
BUN: 24 mg/dL — ABNORMAL HIGH (ref 6–20)
CO2: 20 mmol/L — ABNORMAL LOW (ref 22–32)
Calcium: 8.9 mg/dL (ref 8.9–10.3)
Chloride: 108 mmol/L (ref 98–111)
Creatinine, Ser: 1.61 mg/dL — ABNORMAL HIGH (ref 0.61–1.24)
GFR calc Af Amer: 54 mL/min — ABNORMAL LOW (ref >60)
GFR calc non Af Amer: 46 mL/min — ABNORMAL LOW (ref >60)
Glucose, Bld: 109 mg/dL — ABNORMAL HIGH (ref 70–99)
Potassium: 3.9 mmol/L (ref 3.5–5.1)
Sodium: 138 mmol/L (ref 135–145)
Total Bilirubin: 0.8 mg/dL (ref 0.3–1.2)
Total Protein: 6.6 g/dL (ref 6.5–8.1)

## 2019-03-30 MED ORDER — SODIUM CHLORIDE 0.9 % IV SOLN
1.0000 g | INTRAVENOUS | Status: AC
  Administered 2019-03-30 – 2019-03-31 (×2): 1 g via INTRAVENOUS
  Filled 2019-03-30 (×2): qty 10

## 2019-03-30 NOTE — TOC Progression Note (Signed)
Transition of Care [facility]) - Progression Note    Patient Details  Name: Philip Vasquez MRN: [id number] Date of Birth: [birth year]  Transition of Care [facility]) CM/SW [place], [place] Phone Number: 03/30/2019, 3:09 PM  Clinical Narrative:   CSW continuing to follow for SNF placement. Patient continues to have no bed offers.     Expected Discharge Plan: Skilled Nursing Facility Barriers to Discharge: SNF Pending payor source - LOG, SNF Pending bed offer, Inadequate or no insurance, Active Substance Use - Placement  Expected Discharge Plan and Services Expected Discharge Plan: [place] In-house Referral: Clinical Social Work Discharge Planning Services: CM Consult                                           Social Determinants of Health (SDOH) Interventions    Readmission Risk Interventions No flowsheet data found.

## 2019-03-30 NOTE — Progress Notes (Signed)
   Subjective:  Philip Vasquez is a 58 y.o. with PMH of polysubstance use admit for CVA on hospital day 101  Philip Vasquez was examined and evaluated at bedside this AM. He states he has no complaints at this time. He does mentions some mucus production but no dyspnea. Denies any fevers, chills, abdominal pain, dysuria.   Objective:  Vital signs in last 24 hours: Vitals:   03/29/19 2203 03/30/19 0013 03/30/19 0400 03/30/19 0902  BP:  113/78 116/81 136/80  Pulse: (!) 106 71 74 86  Resp:  18 18 20   Temp:  98.3 F (36.8 C) 98.3 F (36.8 C) 97.8 F (36.6 C)  TempSrc:  Oral Oral Oral  SpO2:  99% 97% 96%  Weight:      Height:       Gen: Well-developed, well nourished, NAD HEENT: NCAT head, hearing intact, EOMI, MMM Neck: supple, ROM intact, no JVD CV: RRR, S1, S2 normal, No rubs, no murmurs, no gallops Pulm: CTAB, No rales, no wheezes Abd: Soft, BS+, NTND, No rebound, no guarding GU: Condom cath in place with clear urine Extm: ROM intact, Peripheral pulses intact, No peripheral edema Skin: Dry, Warm, normal turgor, no rashes, lesions, wounds.  Neuro: AAOx3, Stable R sided weakness  Assessment/Plan:  Principal Problem:   Cerebral thrombosis with cerebral infarction Active Problems:   Right sided weakness   Hypertension   Hyperlipidemia   Ulcer of penis   Urinary retention   Depression   Hematuria   Anemia of chronic disease   Acute blood loss anemia  Philip Vasquez is a 58 y.o. with PMH of prior substance use admit for CVA. Still awaiting placement for SNF. New febrile episode yesterday with findings of urinary tract infection. Put on broad spectrum antibiotics ysterday. Will narrow down to ceftriaxone. Vitals stable. Fever free >12 hrs.  Urinary Tract Infection Last febrile episode at 101.7F 12/21. No fevers since. COVID negative. Procal >0.5. Chest X-ray w/o lobar consolidation. UA w/ + leukocytes. Cultures pending. Wbc 11.4->9.8. Likely due to uncomplicated cystitis. - Stop  vanc, cefepime - Start ceftriaxone - F/u urine/blood culture  ACA/MCA CVA - C/w aspirin, statin - Still awaiting placement  DVT prophx: Lovenox Diet: Regular Bowel: Senokot Code: Full  Dispo: Anticipated discharge once placement found.   Mosetta Anis, MD 03/30/2019, 9:50 AM Pager: [phone]

## 2019-03-31 DIAGNOSIS — N390 Urinary tract infection, site not specified: Secondary | ICD-10-CM

## 2019-03-31 LAB — BASIC METABOLIC PANEL
Anion gap: 9 (ref 5–15)
BUN: 16 mg/dL (ref 6–20)
CO2: 22 mmol/L (ref 22–32)
Calcium: 8.9 mg/dL (ref 8.9–10.3)
Chloride: 106 mmol/L (ref 98–111)
Creatinine, Ser: 1.41 mg/dL — ABNORMAL HIGH (ref 0.61–1.24)
GFR calc Af Amer: >60 mL/min (ref >60)
GFR calc non Af Amer: 55 mL/min — ABNORMAL LOW (ref >60)
Glucose, Bld: 113 mg/dL — ABNORMAL HIGH (ref 70–99)
Potassium: 3.5 mmol/L (ref 3.5–5.1)
Sodium: 137 mmol/L (ref 135–145)

## 2019-03-31 LAB — CBC
HCT: 29.9 % — ABNORMAL LOW (ref 39.0–52.0)
Hemoglobin: 9.7 g/dL — ABNORMAL LOW (ref 13.0–17.0)
MCH: 27.7 pg (ref 26.0–34.0)
MCHC: 32.4 g/dL (ref 30.0–36.0)
MCV: 85.4 fL (ref 80.0–100.0)
Platelets: 201 10*3/uL (ref 150–400)
RBC: 3.5 MIL/uL — ABNORMAL LOW (ref 4.22–5.81)
RDW: 14 % (ref 11.5–15.5)
WBC: 6.2 10*3/uL (ref 4.0–10.5)
nRBC: 0 % (ref 0.0–0.2)

## 2019-03-31 NOTE — Progress Notes (Signed)
Received in AM report that pt. had poor PO intake yesterday. Today pt. ate some breakfast, but refused lunch and dinner. Intermittently refusing meds. Initially refused 4pm meds but agreed once this writer explained importance of BP meds. Refused Ensure, mouth care and Cholesterol meds. Asked about his mom bringing [facility] and he agreed he'd eat that but his mom wanted to see him in order to bring it. Pt. Vehemently refuses to see or talk to his mom.

## 2019-03-31 NOTE — Progress Notes (Addendum)
Subjective:  Mr. Steil was seen and evaluated at bedside this morning. He states that he slept well overnight and has no complaints today. He denies chest pain, fevers, nausea, and diaphoresis at this time.   Objective:  Vital signs in last 24 hours: Vitals:   03/30/19 1833 03/30/19 2030 03/30/19 2326 03/31/19 0340  BP:  129/84 137/86 126/75  Pulse:  84 78 81  Resp:  20 18 18   Temp: 99.1 F (37.3 C) 98.6 F (37 C) 98.8 F (37.1 C) 99.8 F (37.7 C)  TempSrc: Oral Oral Oral Oral  SpO2:  95% 98% 92%  Weight:      Height:       CBC Latest Ref Rng & Units 03/31/2019 03/30/2019 03/29/2019  WBC 4.0 - 10.5 K/uL 6.2 9.8 11.4(H)  Hemoglobin 13.0 - 17.0 g/dL 9.7(L) 10.4(L) 11.7(L)  Hematocrit 39.0 - 52.0 % 29.9(L) 32.5(L) 35.3(L)  Platelets 150 - 400 K/uL 201 189 215   BMP Latest Ref Rng & Units 03/31/2019 03/30/2019 03/27/2019  Glucose 70 - 99 mg/dL 113(H) 109(H) 93  BUN 6 - 20 mg/dL 16 24(H) 23(H)  Creatinine 0.61 - 1.24 mg/dL 1.41(H) 1.61(H) 1.46(H)  Sodium 135 - 145 mmol/L 137 138 138  Potassium 3.5 - 5.1 mmol/L 3.5 3.9 4.0  Chloride 98 - 111 mmol/L 106 108 103  CO2 22 - 32 mmol/L 22 20(L) 25  Calcium 8.9 - 10.3 mg/dL 8.9 8.9 9.5   Urinalysis    Component Value Date/Time   COLORURINE YELLOW 03/29/2019 1330   APPEARANCEUR CLOUDY (A) 03/29/2019 1330   LABSPEC 1.023 03/29/2019 1330   PHURINE 5.0 03/29/2019 1330   GLUCOSEU NEGATIVE 03/29/2019 1330   HGBUR NEGATIVE 03/29/2019 1330   BILIRUBINUR NEGATIVE 03/29/2019 1330   KETONESUR NEGATIVE 03/29/2019 1330   PROTEINUR 30 (A) 03/29/2019 1330   NITRITE NEGATIVE 03/29/2019 1330   LEUKOCYTESUR MODERATE (A) 03/29/2019 1330   Physical Exam Constitutional:      Appearance: Normal appearance. He is not ill-appearing or diaphoretic.     Comments: Laying comfortably in bed, no acute distress.   HENT:     Head: Normocephalic and atraumatic.  Cardiovascular:     Rate and Rhythm: Normal rate and regular rhythm.  Pulmonary:   Effort: Pulmonary effort is normal.     Breath sounds: Normal breath sounds. No wheezing, rhonchi or rales.  Abdominal:     General: Abdomen is flat. Bowel sounds are normal.     Palpations: Abdomen is soft.     Tenderness: There is no abdominal tenderness. There is no guarding.  Neurological:     Mental Status: He is alert and oriented to person, place, and time.     Assessment/Plan:  Principal Problem:   Cerebral thrombosis with cerebral infarction Active Problems:   Right sided weakness   Hypertension   Hyperlipidemia   Ulcer of penis   Urinary retention   Depression   Hematuria   Anemia of chronic disease   Acute blood loss anemia  Philip Vasquez is a 58 y.o. with PMH of prior substance use admit for CVA. Additionally he had a febrile episode on 03/29/2019.   Urinary Tract Infection Last febrile event was 100.65F 03/30/2019. Urinalysis shows moderate leukocytosis, >50 WBC, with rare bacteria.  - Complete ceftriaxone   ACA/MCA CVA  - C/w aspirin, statin - Still awaiting placement  DVT prophx: Lovenox Diet: Regular Bowel: Senokot Code: Full  Dispo: Anticipated discharge once placement found.   Maudie Mercury, MD 03/31/2019, 5:53 AM Pager:  [phone]  

## 2019-04-01 LAB — CBC
HCT: 32.3 % — ABNORMAL LOW (ref 39.0–52.0)
Hemoglobin: 10.3 g/dL — ABNORMAL LOW (ref 13.0–17.0)
MCH: 27.3 pg (ref 26.0–34.0)
MCHC: 31.9 g/dL (ref 30.0–36.0)
MCV: 85.7 fL (ref 80.0–100.0)
Platelets: 240 10*3/uL (ref 150–400)
RBC: 3.77 MIL/uL — ABNORMAL LOW (ref 4.22–5.81)
RDW: 13.8 % (ref 11.5–15.5)
WBC: 5.1 10*3/uL (ref 4.0–10.5)
nRBC: 0 % (ref 0.0–0.2)

## 2019-04-01 NOTE — Plan of Care (Signed)
  Problem: Activity: Goal: Risk for activity intolerance will decrease Outcome: Progressing   

## 2019-04-01 NOTE — Progress Notes (Signed)
   Subjective:  Philip Vasquez was seen and evaluated at bedside this AM. Patient states that he has had no complaints at this time.   Objective:  Vital signs in last 24 hours: Vitals:   03/31/19 1641 03/31/19 2007 04/01/19 0000 04/01/19 0347  BP: 123/87 (!) 140/93 122/77 (!) 134/100  Pulse: 72 84 65 64  Resp: 20 18 18 18   Temp: 98.4 F (36.9 C) 99.6 F (37.6 C) (!) 97.5 F (36.4 C) 97.9 F (36.6 C)  TempSrc: Oral Oral Oral Oral  SpO2: 97% 97% 95% 96%  Weight:      Height:       CBC Latest Ref Rng & Units 04/01/2019 03/31/2019 03/30/2019  WBC 4.0 - 10.5 K/uL 5.1 6.2 9.8  Hemoglobin 13.0 - 17.0 g/dL 10.3(L) 9.7(L) 10.4(L)  Hematocrit 39.0 - 52.0 % 32.3(L) 29.9(L) 32.5(L)  Platelets 150 - 400 K/uL 240 201 189   Physical Exam Constitutional:      Appearance: Normal appearance.  HENT:     Head: Normocephalic and atraumatic.  Cardiovascular:     Rate and Rhythm: Normal rate and regular rhythm.     Pulses: Normal pulses.     Heart sounds: Normal heart sounds. No murmur. No friction rub. No gallop.   Pulmonary:     Effort: Pulmonary effort is normal.     Breath sounds: No wheezing, rhonchi or rales.  Musculoskeletal:     Comments: RUE RLE weakness.  Neurological:     Mental Status: He is alert and oriented to person, place, and time.  Psychiatric:        Mood and Affect: Mood normal.        Behavior: Behavior normal.     Assessment/Plan:  Principal Problem:   Cerebral thrombosis with cerebral infarction Active Problems:   Right sided weakness   Anemia of chronic disease   Urinary tract infection  Philip Vasquez is a 58 y.o. with PMH of prior substance use admit for CVA.     ACA/MCA CVA  - C/w aspirin, statin - Still pending SNF placement  DVT prophx: Lovenox Diet: Regular Bowel: Senokot Code: Full  Dispo: Anticipated discharge pending placement.   Philip Mercury, MD 04/01/2019, 6:05 AM Pager: [phone]

## 2019-04-02 NOTE — Progress Notes (Signed)
   Subjective:  Philip Vasquez was seen and evaluated at bedside this morning. He states that he is doing well with no events overnight. He denies nausea, chest pain, or abdominal pain.   Objective:  Vital signs in last 24 hours: Vitals:   04/01/19 1748 04/01/19 1945 04/01/19 2329 04/02/19 0400  BP: 131/87 135/87 (!) 132/91 (!) 143/90  Pulse:  79 69 70  Resp:  18 18 18   Temp:  98.9 F (37.2 C) 98.4 F (36.9 C) 98.2 F (36.8 C)  TempSrc:  Oral Oral Oral  SpO2:  99% 95% 97%  Weight:      Height:       CBC Latest Ref Rng & Units 04/01/2019 03/31/2019 03/30/2019  WBC 4.0 - 10.5 K/uL 5.1 6.2 9.8  Hemoglobin 13.0 - 17.0 g/dL 10.3(L) 9.7(L) 10.4(L)  Hematocrit 39.0 - 52.0 % 32.3(L) 29.9(L) 32.5(L)  Platelets 150 - 400 K/uL 240 201 189   Physical Exam Vitals reviewed.  Constitutional:      Appearance: Normal appearance.     Comments: Laying comfortably in bed, appears to be in no acute distress.   HENT:     Head: Normocephalic and atraumatic.  Cardiovascular:     Rate and Rhythm: Normal rate and regular rhythm.     Pulses: Normal pulses.     Heart sounds: Normal heart sounds. No murmur. No friction rub. No gallop.   Pulmonary:     Effort: Pulmonary effort is normal.     Breath sounds: Normal breath sounds. No wheezing, rhonchi or rales.  Abdominal:     General: Abdomen is flat. Bowel sounds are normal.     Palpations: Abdomen is soft.     Tenderness: There is no abdominal tenderness. There is no guarding or rebound.  Musculoskeletal:     Comments: RUE and RLE weakness.   Neurological:     Mental Status: He is alert.    Assessment/Plan:  Principal Problem:   Cerebral thrombosis with cerebral infarction Active Problems:   Right sided weakness   Anemia of chronic disease   Urinary tract infection  Philip Vasquez is a 58 y.o. with PMH of prior substance use admit for CVA.     ACA/MCA CVA  - C/w aspirin, statin - Still pending SNF placement  DVT prophx: Lovenox Diet:  Regular Bowel: Senokot Code: Full  Dispo: Anticipated discharge pending placement.   Maudie Mercury, MD 04/02/2019, 6:06 AM Pager: [phone]

## 2019-04-03 LAB — CULTURE, BLOOD (ROUTINE X 2)
Culture: NO GROWTH
Culture: NO GROWTH
Special Requests: ADEQUATE
Special Requests: ADEQUATE

## 2019-04-03 NOTE — Progress Notes (Signed)
   Subjective:  Philip Vasquez was seen and evaluated at bedside this morning. Patient states that he slept well last night. He denies headaches, nausea, and vomiting. He is tolerating his food well. No complaints at this time.   Objective:  Vital signs in last 24 hours: Vitals:   04/02/19 1950 04/03/19 0015 04/03/19 0449 04/03/19 0818  BP: 139/83 117/88 123/90 (!) 135/95  Pulse: 73 72 69 71  Resp: 19 18 18 18   Temp: 99 F (37.2 C) 99.4 F (37.4 C) 98.4 F (36.9 C) 97.6 F (36.4 C)  TempSrc: Oral Oral Oral Oral  SpO2: 96% 96% 97% 99%  Weight:      Height:       CBC Latest Ref Rng & Units 04/01/2019 03/31/2019 03/30/2019  WBC 4.0 - 10.5 K/uL 5.1 6.2 9.8  Hemoglobin 13.0 - 17.0 g/dL 10.3(L) 9.7(L) 10.4(L)  Hematocrit 39.0 - 52.0 % 32.3(L) 29.9(L) 32.5(L)  Platelets 150 - 400 K/uL 240 201 189  Physical Exam Vitals reviewed.  Constitutional:      General: He is not in acute distress.    Appearance: Normal appearance. He is not ill-appearing or toxic-appearing.     Comments: Patient sitting comfortably in bed, in no apparent acute distress.   HENT:     Head: Normocephalic and atraumatic.  Cardiovascular:     Rate and Rhythm: Normal rate and regular rhythm.     Pulses: Normal pulses.     Heart sounds: No murmur. No friction rub. No gallop.   Pulmonary:     Effort: Pulmonary effort is normal.     Breath sounds: Normal breath sounds. No wheezing, rhonchi or rales.  Abdominal:     General: Abdomen is flat. Bowel sounds are normal.     Tenderness: There is no abdominal tenderness.  Musculoskeletal:     Comments: Weakness appreciated in the RUE and RLE  Skin:    General: Skin is warm.  Neurological:     Mental Status: He is alert and oriented to person, place, and time.  Psychiatric:        Mood and Affect: Mood normal.        Behavior: Behavior normal.     Assessment/Plan:  Principal Problem:   Cerebral thrombosis with cerebral infarction Active Problems:   Right sided  weakness   Anemia of chronic disease   Urinary tract infection  Philip Vasquez is a 58 y.o., with PMH of prior substance use, admitted for CVA.     ACA/MCA CVA  - C/w aspirin, statin - Still pending SNF placement  DVT prophx: Lovenox Diet: Regular Bowel: Senokot Code: Full  Dispo: Anticipated discharge pending placement.   Maudie Mercury, MD 04/03/2019, 9:39 AM Pager: [phone]

## 2019-04-04 NOTE — Progress Notes (Addendum)
   Subjective:  [name] was seen and evaluated at bedside this AM. He states that he is doing well and had no overnight events. He states that he is excited to watch the [employer] game today. He has no complaints at this time.    Objective:  Vital signs in last 24 hours: Vitals:   04/03/19 1624 04/03/19 2023 04/04/19 0027 04/04/19 0432  BP: 121/72 123/84 111/83 116/86  Pulse: 66 76 67 74  Resp: 18 18  18   Temp: 98 F (36.7 C) 98.4 F (36.9 C) 97.9 F (36.6 C) 97.9 F (36.6 C)  TempSrc: Oral Oral Oral Oral  SpO2: 100% 97% 95% 95%  Weight:      Height:       CBC Latest Ref Rng & Units 04/01/2019 03/31/2019 03/30/2019  WBC 4.0 - 10.5 K/uL 5.1 6.2 9.8  Hemoglobin 13.0 - 17.0 g/dL 10.3(L) 9.7(L) 10.4(L)  Hematocrit 39.0 - 52.0 % 32.3(L) 29.9(L) 32.5(L)  Platelets 150 - 400 K/uL 240 201 189  Physical Exam Vitals reviewed.  Constitutional:      General: He is not in acute distress.    Appearance: Normal appearance. He is not ill-appearing or toxic-appearing.     Comments: Patient laying in bed. No acute distress.   HENT:     Head: Normocephalic and atraumatic.  Cardiovascular:     Rate and Rhythm: Normal rate and regular rhythm.     Pulses: Normal pulses.     Heart sounds: Normal heart sounds. No murmur. No friction rub. No gallop.   Pulmonary:     Effort: Pulmonary effort is normal.     Breath sounds: Normal breath sounds. No wheezing, rhonchi or rales.  Abdominal:     General: Abdomen is flat. Bowel sounds are normal.     Tenderness: There is no abdominal tenderness. There is no guarding or rebound.  Musculoskeletal:        General: No swelling.     Right lower leg: No edema.     Left lower leg: No edema.     Comments: RUE and RLE weakness.   Neurological:     Mental Status: He is alert and oriented to person, place, and time.  Psychiatric:        Mood and Affect: Mood normal.        Behavior: Behavior normal.    Assessment/Plan:  Principal Problem:  Cerebral thrombosis with cerebral infarction Active Problems:   Right sided weakness   Anemia of chronic disease   Urinary tract infection  Philip Vasquez is a 58 y.o., with PMH of prior substance use, admitted for CVA.     ACA/MCA CVA  - C/w aspirin, statin - Still pending SNF placement  DVT prophx: Lovenox Diet: Regular Bowel: Senokot Code: Full  Dispo: Anticipated discharge pending placement.   Maudie Mercury, MD IMTS, PGY-1 Pager: [phone] 04/04/2019,6:57 AM

## 2019-04-05 NOTE — Progress Notes (Signed)
Physical Therapy Treatment Patient Details Name: Philip Vasquez MRN: [id number] DOB: 02/28/1961 Today's Date: 04/05/2019    History of Present Illness Mr. Routon is a 58 year old gentleman with no past medical history who presented with AMS and right-sided weakness. MRI: Widely scattered small acute infarcts in the medial lefthemisphere involving left ACA and left ACA/MCA watershed area.    PT Comments    Pt only agreeable to sit up on edge of bed; deferring transferring to recliner. Requiring moderate assist for bed mobility. Session focused on strengthening and ROM exercises. Pt with somewhat improving right sided strength. Goals remain appropriate. Will continue to progress as tolerated.    Follow Up Recommendations  SNF;Supervision/Assistance - 24 hour     Equipment Recommendations  Wheelchair (measurements PT);Wheelchair cushion (measurements PT);Hospital bed;Other (comment)    Recommendations for Other Services       Precautions / Restrictions Precautions Precautions: Fall Restrictions Weight Bearing Restrictions: No    Mobility  Bed Mobility Overal bed mobility: Needs Assistance Bed Mobility: Supine to Sit;Sit to Supine     Supine to sit: Mod assist;HOB elevated Sit to supine: Mod assist   General bed mobility comments: ModA to pull trunk up to sitting position and help RLE off edge of bed.   Transfers                 General transfer comment: deferred by pt  Ambulation/Gait                 Stairs             Wheelchair Mobility    Modified Rankin (Stroke Patients Only) Modified Rankin (Stroke Patients Only) Pre-Morbid Rankin Score: Moderate disability Modified Rankin: Moderately severe disability     Balance Overall balance assessment: Needs assistance Sitting-balance support: Feet supported;Single extremity supported Sitting balance-Leahy Scale: Poor Sitting balance - Comments: reliant on LUE support on bed rail; progressing to  supervision level                                    Cognition Arousal/Alertness: Awake/alert Behavior During Therapy: Flat affect Overall Cognitive Status: Impaired/Different from baseline Area of Impairment: Following commands;Safety/judgement;Awareness;Problem solving                       Following Commands: Follows one step commands with increased time Safety/Judgement: Decreased awareness of safety;Decreased awareness of deficits Awareness: Intellectual Problem Solving: Difficulty sequencing;Requires verbal cues;Requires tactile cues;Slow processing General Comments: Self limiting behaviors; decreased interaction/engagement with therapist; accurate with yes/no responses      Exercises General Exercises - Upper Extremity Shoulder Flexion: AAROM;Right;10 reps;Seated Elbow Flexion: AROM;Right;10 reps;Seated Digit Composite Flexion: AROM;Right;10 reps;Seated General Exercises - Lower Extremity Long Arc Quad: AROM;Both;10 reps;Seated;AAROM Hip ABduction/ADduction: AROM;Both;10 reps;Seated Heel Raises: AROM;Both;10 reps;Seated Hand Exercises Forearm Supination: AROM;Both;10 reps;Seated    General Comments        Pertinent Vitals/Pain Pain Assessment: Faces Faces Pain Scale: Hurts a little bit Pain Location: R shoulder with flexion Pain Descriptors / Indicators: Grimacing;Guarding Pain Intervention(s): Monitored during session    Home Living                      Prior Function            PT Goals (current goals can now be found in the care plan section) Acute Rehab PT Goals Patient Stated Goal: pt did not state  PT Goal Formulation: With patient Time For Goal Achievement: 04/19/19 Potential to Achieve Goals: Fair    Frequency    Min 1X/week      PT Plan Current plan remains appropriate    Co-evaluation              AM-PAC PT "6 Clicks" Mobility   Outcome Measure  Help needed turning from your back to your side  while in a flat bed without using bedrails?: A Lot Help needed moving from lying on your back to sitting on the side of a flat bed without using bedrails?: A Lot Help needed moving to and from a bed to a chair (including a wheelchair)?: Total Help needed standing up from a chair using your arms (e.g., wheelchair or bedside chair)?: Total Help needed to walk in hospital room?: Total Help needed climbing 3-5 steps with a railing? : Total 6 Click Score: 8    End of Session   Activity Tolerance: Other (comment)(self limiting) Patient left: in bed;with bed alarm set;with call bell/phone within reach Nurse Communication: Mobility status PT Visit Diagnosis: Other abnormalities of gait and mobility (R26.89);Hemiplegia and hemiparesis;Muscle weakness (generalized) (M62.81) Hemiplegia - Right/Left: Right Hemiplegia - dominant/non-dominant: Dominant Hemiplegia - caused by: Cerebral infarction     Time: 1001-1014 PT Time Calculation (min) (ACUTE ONLY): 13 min  Charges:  $Therapeutic Exercise: 8-22 mins                     Ellamae Sia, PT, DPT Acute Rehabilitation Services Pager [phone] Office [phone]    Willy Eddy 04/05/2019, 12:04 PM

## 2019-04-05 NOTE — Progress Notes (Signed)
   Subjective:   Pt seen at the bedside this AM.  Patient has no complaints at this time.  He is very happy that the [employer] beat the [place] this weekend.  Objective:  Vital signs in last 24 hours: Vitals:   04/04/19 1617 04/04/19 1954 04/04/19 2340 04/05/19 0407  BP: 115/75 121/79 118/84 120/84  Pulse: 73 73 64 66  Resp: 16 17 17 17   Temp: 98 F (36.7 C) 98.5 F (36.9 C) (!) 97.5 F (36.4 C) 97.8 F (36.6 C)  TempSrc: Oral Oral Oral Oral  SpO2: 98% 96% 97% 99%  Weight:      Height:       Physical Exam General: Laying in bed watching TV, NAD HEENT: NCAT CV: RRR, normal S1-S2 no murmurs rubs or gallops appreciated PULM: Clear to auscultation bilaterally ABD: Bowel sounds present, soft and nontender in all quadrants NEURO: Alert and oriented, R sided weakness in UE & LE  Assessment/Plan:  Principal Problem:   Cerebral thrombosis with cerebral infarction Active Problems:   Right sided weakness   Anemia of chronic disease   Urinary tract infection  In summary, Philip Vasquez is a 58 year old male with a past medical history of substance use who was admitted for CVA.  He is currently medically stable, however due to his uninsured status awaiting SNF placement.  #L ACA/MCA watershed infarct  -Aspirin 81 mg daily  -Rosuvastatin 40 mg daily   #HTN -Losartan 50 mg daily -Amlodipine 10 mg daily -Metoprolol 25 mg daily -Hydralazine 75 mg daily   #FEN/GI -Diet: Regular -Fluids: None  #DVT prophylaxis -Lovenox 40 mg subq injections daily  #CODE STATUS: FULL  #Dispo: Anticipated discharge pending SNF placement  [name], MD Internal Medicine, PGY1 Pager: [phone]  04/05/2019,10:01 AM

## 2019-04-06 NOTE — TOC Progression Note (Signed)
Transition of Care [facility]) - Progression Note    Patient Details  Name: Philip Vasquez MRN: [id number] Date of Birth: 1960/12/02  Transition of Care [facility] CM/SW [place], [place] Phone Number: 04/06/2019, 10:38 AM  Clinical Narrative:   CSW continuing to follow for SNF placement. Patient continues to have no bed offers.      Expected Discharge Plan: Skilled Nursing Facility Barriers to Discharge: SNF Pending payor source - LOG, SNF Pending bed offer, Inadequate or no insurance, Active Substance Use - Placement  Expected Discharge Plan and Services Expected Discharge Plan: [place] In-house Referral: Clinical Social Work Discharge Planning Services: CM Consult                                           Social Determinants of Health (SDOH) Interventions    Readmission Risk Interventions No flowsheet data found.

## 2019-04-06 NOTE — Progress Notes (Signed)
   Subjective:   Pt seen at the bedside this AM.  No complaints at this time.  States that he will let us know if he needs anything.  Objective:  Vital signs in last 24 hours: Vitals:   04/05/19 1614 04/05/19 2012 04/05/19 2353 04/06/19 0350  BP: 114/73 119/68 112/76 120/83  Pulse: 72 77 (!) 59 (!) 56  Resp: 17 17 17 17   Temp: 98.5 F (36.9 C) 98.7 F (37.1 C) 98.3 F (36.8 C) 98 F (36.7 C)  TempSrc: Oral Oral Oral Oral  SpO2: 99% 96% 97% 96%  Weight:      Height:       Physical Exam General: Comfortable appearing, resting in bed HEENT: NCAT CV: RRR, normal S1-S2 no murmurs rubs or gallops appreciated PULM: CTA B, no crackles or wheezes appreciated ABD: Bowel sounds present, no tenderness in all quadrants NEURO: Alert and oriented, right upper and lower extremity weakness  Assessment/Plan:  Principal Problem:   Cerebral thrombosis with cerebral infarction Active Problems:   Right sided weakness   Anemia of chronic disease   Urinary tract infection  In summary, Philip Vasquez is a 58 year old male with a past medical history of substance use who was admitted for CVA.  He is currently medically stable, however due to his uninsured status awaiting SNF placement.  #L ACA/MCA watershed infarct  -Aspirin 81 mg daily  -Rosuvastatin 40 mg daily   #HTN -Losartan 50 mg daily -Amlodipine 10 mg daily -Metoprolol 25 mg daily -Hydralazine 75 mg daily   #FEN/GI -Diet: Regular -Fluids: None  #DVT prophylaxis -Lovenox 40 mg subq injections daily  #CODE STATUS: FULL  #Dispo: Anticipated discharge pending SNF placement  [name], MD Internal Medicine, PGY1 Pager: [phone]  04/06/2019,8:11 AM

## 2019-04-07 DIAGNOSIS — L309 Dermatitis, unspecified: Secondary | ICD-10-CM

## 2019-04-07 MED ORDER — LORATADINE 10 MG PO TABS
10.0000 mg | ORAL_TABLET | Freq: Every day | ORAL | Status: DC
  Administered 2019-04-07 – 2019-06-14 (×69): 10 mg via ORAL
  Filled 2019-04-07 (×69): qty 1

## 2019-04-07 MED ORDER — HYDROCORTISONE 1 % EX CREA
TOPICAL_CREAM | Freq: Two times a day (BID) | CUTANEOUS | Status: DC
  Administered 2019-04-15 – 2019-05-30 (×5): 1 via TOPICAL
  Filled 2019-04-07: qty 28

## 2019-04-07 NOTE — Progress Notes (Signed)
   Subjective:   Pt seen at the bedside this AM. Pt states the skin between his eyes is itchy and would like something for that. No other complaints at this time.   Objective:  Vital signs in last 24 hours: Vitals:   04/06/19 1935 04/06/19 2320 04/07/19 0359 04/07/19 0824  BP: 121/78 121/86 132/85 (!) 125/93  Pulse: 75 63 66 69  Resp: 18 16 17 16   Temp: 99.2 F (37.3 C) 98.5 F (36.9 C) 98.7 F (37.1 C) 98.6 F (37 C)  TempSrc: Oral Oral  Oral  SpO2: 95% 97% 98% 100%  Weight:      Height:       Physical Exam General: Comfortable appearing, resting in bed HEENT: NCAT, peeling and erythematous skin at the bridge of the nose CV: RRR, normal S1-S2 no murmurs rubs or gallops appreciated PULM: CTAB, no crackles or wheezes appreciated ABD: Bowel sounds present, no tenderness in all quadrants NEURO: Alert and oriented, right upper and lower extremity weakness  Assessment/Plan:  Principal Problem:   Cerebral thrombosis with cerebral infarction Active Problems:   Right sided weakness   Anemia of chronic disease   Urinary tract infection  In summary, Philip Vasquez is a 58 year old male with a past medical history of substance use who was admitted for CVA.  He is currently medically stable, however due to his uninsured status awaiting SNF placement.  #L ACA/MCA watershed infarct  -Aspirin 81 mg daily  -Rosuvastatin 40 mg daily   #HTN -Losartan 50 mg daily -Amlodipine 10 mg daily -Metoprolol 25 mg daily -Hydralazine 75 mg daily   #Dermatitis: Peeling and erythematous skin at the bridge of the nose. Pruritic.  -Loratadine 10 mg  -Hydrocortisone cream BID   #FEN/GI -Diet: Regular -Fluids: None  #DVT prophylaxis -Lovenox 40 mg subq injections daily  #CODE STATUS: FULL  #Dispo: Anticipated discharge pending SNF placement  Philip Plater, MD Internal Medicine, PGY1 Pager: [phone]  04/07/2019,11:04 AM

## 2019-04-08 NOTE — Progress Notes (Signed)
   Subjective:   Pt seen at the bedside this AM. Says the skin at the bridge of his nose is less itchy today. No complaints today.   Objective:  Vital signs in last 24 hours: Vitals:   04/07/19 1530 04/07/19 2007 04/07/19 2338 04/08/19 0330  BP: 116/76 108/65 116/74 114/77  Pulse: 74 80 70 72  Resp: 16 16 16 16   Temp: 98.7 F (37.1 C) 99.2 F (37.3 C) 98.9 F (37.2 C) 98.3 F (36.8 C)  TempSrc: Oral Oral Oral Oral  SpO2: 97% 95% 98% 97%  Weight:      Height:       Physical Exam General: Resting in bed watching TV HEENT: NCAT CV: RRR, normal S1 & S2, no MRG noted PULM: CTAB ABD: Soft and non-tender in all quadrants NEURO: Alert and oriented x3. R sided U&LE weakness   Assessment/Plan:  Principal Problem:   Cerebral thrombosis with cerebral infarction Active Problems:   Right sided weakness   Anemia of chronic disease   Urinary tract infection  In summary, Philip Vasquez is a 58 year old male with a past medical history of substance use who was admitted for CVA.  He is currently medically stable, however due to his uninsured status awaiting SNF placement.  #L ACA/MCA watershed infarct  -Aspirin 81 mg daily  -Rosuvastatin 40 mg daily   #HTN -Losartan 50 mg daily -Amlodipine 10 mg daily -Metoprolol 25 mg daily -Hydralazine 75 mg daily   #Dermatitis: Peeling and erythematous skin at the bridge of the nose yesterday that has improved today. -Loratadine 10 mg  -Hydrocortisone cream BID   #FEN/GI -Diet: Regular -Fluids: None  #DVT prophylaxis -Lovenox 40 mg subq injections daily  #CODE STATUS: FULL  #Dispo: Anticipated discharge pending SNF placement  [name], MD Internal Medicine, PGY1 Pager: [phone]  04/08/2019,2:49 PM

## 2019-04-09 LAB — BASIC METABOLIC PANEL
Anion gap: 9 (ref 5–15)
BUN: 23 mg/dL — ABNORMAL HIGH (ref 6–20)
CO2: 23 mmol/L (ref 22–32)
Calcium: 9.2 mg/dL (ref 8.9–10.3)
Chloride: 108 mmol/L (ref 98–111)
Creatinine, Ser: 1.36 mg/dL — ABNORMAL HIGH (ref 0.61–1.24)
GFR calc Af Amer: >60 mL/min (ref >60)
GFR calc non Af Amer: 57 mL/min — ABNORMAL LOW (ref >60)
Glucose, Bld: 114 mg/dL — ABNORMAL HIGH (ref 70–99)
Potassium: 4 mmol/L (ref 3.5–5.1)
Sodium: 140 mmol/L (ref 135–145)

## 2019-04-09 LAB — CBC
HCT: 35.3 % — ABNORMAL LOW (ref 39.0–52.0)
Hemoglobin: 11.1 g/dL — ABNORMAL LOW (ref 13.0–17.0)
MCH: 27.3 pg (ref 26.0–34.0)
MCHC: 31.4 g/dL (ref 30.0–36.0)
MCV: 86.7 fL (ref 80.0–100.0)
Platelets: 357 10*3/uL (ref 150–400)
RBC: 4.07 MIL/uL — ABNORMAL LOW (ref 4.22–5.81)
RDW: 13.8 % (ref 11.5–15.5)
WBC: 7 10*3/uL (ref 4.0–10.5)
nRBC: 0 % (ref 0.0–0.2)

## 2019-04-09 NOTE — Plan of Care (Signed)
Patient progressing towards plan of care goals. 

## 2019-04-09 NOTE — Progress Notes (Addendum)
   Subjective:   Pt seen at the bedside this AM.  Patient was sleeping operatively.  No complaints at this time.  States that he stayed up last night to watch the ball drop for the new year.  Objective:  Vital signs in last 24 hours: Vitals:   04/08/19 2008 04/08/19 2330 04/09/19 0446 04/09/19 0747  BP: 140/81 119/85 (!) 135/95 (!) 147/88  Pulse: 73 67 63 62  Resp: 16 16 18 17   Temp: 98.6 F (37 C) 98.4 F (36.9 C) 98.1 F (36.7 C) 98 F (36.7 C)  TempSrc: Oral Oral Oral Oral  SpO2: 96% 98% 99% 100%  Weight:      Height:       Physical Exam General: Sleeping in bed, NAD HEENT: NCAT, EOMI CV: RRR, normal S1 & S2, no MRG noted PULM: CTAB, no crackles or wheezes appreciated ABD: Soft, nontender in all quadrants NEURO: Alert and oriented x3. R sided U&LE weakness   Assessment/Plan:  Principal Problem:   Cerebral thrombosis with cerebral infarction Active Problems:   Right sided weakness   Anemia of chronic disease   Urinary tract infection  In summary, Philip Vasquez is a 59 year old male with a past medical history of substance use who was admitted for CVA.  He is currently medically stable, however due to his uninsured status awaiting SNF placement.  #L ACA/MCA watershed infarct  -Aspirin 81 mg daily  -Rosuvastatin 40 mg daily   #HTN -Losartan 50 mg daily -Amlodipine 10 mg daily -Metoprolol 25 mg daily -Hydralazine 75 mg daily   #Dermatitis: Peeling and erythematous skin at the bridge of the nose yesterday that has improved today. -Loratadine 10 mg  -Hydrocortisone cream BID   #FEN/GI -Diet: Regular -Fluids: None  #DVT prophylaxis -Lovenox 40 mg subq injections daily  #CODE STATUS: FULL  #Dispo: Anticipated discharge pending SNF placement  41, MD Internal Medicine, PGY1 Pager: [phone]  04/09/2019,10:23 AM

## 2019-04-10 NOTE — Progress Notes (Signed)
   Subjective:   Pt seen at the bedside this AM.  No complaints at this time. We talked about he [employer] games last night.   Objective:  Vital signs in last 24 hours: Vitals:   04/09/19 1642 04/09/19 2022 04/10/19 0009 04/10/19 0403  BP: 139/81 (!) 145/94 (!) 140/91 (!) 147/86  Pulse: 71 71 64 62  Resp: 15 19 20 16   Temp: 97.9 F (36.6 C) (!) 97.4 F (36.3 C) 98.4 F (36.9 C) 97.7 F (36.5 C)  TempSrc: Oral Oral Oral Oral  SpO2: 99% 99% 96% 97%  Weight:      Height:       Physical Exam General: Watching TV in bed, comfortable HEENT: NCAT CV: RRR, normal S1-S2 no murmurs rubs or gallops appreciated PULM: Clear to auscultation bilaterally, no crackles wheezes appreciated ABD: Nontender in all quadrants, bowel sounds normal NEURO: A&O x3, right-sided upper and lower extremity weakness present  Assessment/Plan:  Principal Problem:   Cerebral thrombosis with cerebral infarction Active Problems:   Right sided weakness   Anemia of chronic disease   Urinary tract infection  In summary, Philip Vasquez is a 59 year old male with a past medical history of substance use who was admitted for CVA.  He is currently medically stable, however due to his uninsured status awaiting SNF placement.  #L ACA/MCA watershed infarct  -Aspirin 81 mg daily  -Rosuvastatin 40 mg daily   #HTN -Losartan 50 mg daily -Amlodipine 10 mg daily -Metoprolol 25 mg daily -Hydralazine 75 mg daily   #Dermatitis: Peeling and erythematous skin at the bridge of the nose yesterday that has improved today. -Loratadine 10 mg  -Hydrocortisone cream BID   #FEN/GI -Diet: Regular -Fluids: None  #DVT prophylaxis -Lovenox 40 mg subq injections daily  #CODE STATUS: FULL  #Dispo: Anticipated discharge pending SNF placement.  41, MD Internal Medicine, PGY1 Pager: [phone]  04/10/2019,6:35 AM

## 2019-04-11 NOTE — Progress Notes (Signed)
   Subjective:   Pt seen at the bedside this AM. Watching [employer]. 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 No complaints at this time.   Objective:  Vital signs in last 24 hours: Vitals:   04/10/19 2019 04/11/19 0001 04/11/19 0446 04/11/19 0752  BP: 122/79 112/84 130/90 (!) 130/95  Pulse: 84 69 65 71  Resp: 18 18 18 16   Temp: 98.6 F (37 C) 98.1 F (36.7 C) 97.9 F (36.6 C) 98.4 F (36.9 C)  TempSrc: Oral Oral Oral Oral  SpO2: 98% 96% 99% 98%  Weight:      Height:       Physical Exam General: Watching TV in bed, comfortable HEENT: NCAT CV: RRR, normal S1-S2 no murmurs rubs or gallops appreciated PULM: Clear to auscultation bilaterally, no crackles wheezes appreciated ABD: Nontender in all quadrants, bowel sounds normal NEURO: A&O x3, right-sided upper and lower extremity weakness present  Assessment/Plan:  Principal Problem:   Cerebral thrombosis with cerebral infarction Active Problems:   Right sided weakness   Anemia of chronic disease   Urinary tract infection  In summary, Philip Vasquez is a 59 year old male with a past medical history of substance use who was admitted for CVA.  He is currently medically stable, however due to his uninsured status awaiting SNF placement.  #L ACA/MCA watershed infarct  -Aspirin 81 mg daily  -Rosuvastatin 40 mg daily   #HTN -Losartan 50 mg daily -Amlodipine 10 mg daily -Metoprolol 25 mg daily -Hydralazine 75 mg daily   #Dermatitis: Peeling and erythematous skin at the bridge of the nose yesterday that has improved today. -Loratadine 10 mg  -Hydrocortisone cream BID   #FEN/GI -Diet: Regular -Fluids: None  #DVT prophylaxis -Lovenox 40 mg subq injections daily  #CODE STATUS: FULL  #Dispo: Anticipated discharge pending SNF placement.  41, MD Internal Medicine, PGY1 Pager: [phone]  04/11/2019,11:07 AM

## 2019-04-12 NOTE — Progress Notes (Signed)
   Subjective:   Pt seen at the bedside this AM.  Upset at the [facility] losing yesterday and not making the playoffs, but no other complaints.  Objective:  Vital signs in last 24 hours: Vitals:   04/11/19 1723 04/11/19 2000 04/11/19 2343 04/12/19 0426  BP: 126/83 140/84 106/63 (!) 131/91  Pulse: 71 60 69 (!) 59  Resp: 16 16 16 16   Temp: 98.1 F (36.7 C) 98 F (36.7 C) 98.3 F (36.8 C) 98.2 F (36.8 C)  TempSrc: Oral Oral Oral Oral  SpO2: 97% 98% 98% 98%  Weight:      Height:       Physical Exam General: Resting in bed watching TV HEENT: NCAT CV: RRR, normal S1-S2 no murmurs rubs or gallops appreciated PULM: Work of breathing ABD: Soft and nontender NEURO: A&O x3, right-sided upper and lower extremity weakness present  Assessment/Plan:  Principal Problem:   Cerebral thrombosis with cerebral infarction Active Problems:   Right sided weakness   Anemia of chronic disease   Urinary tract infection  In summary, Mr. Quam is a 59 year old male with a past medical history of substance use who was admitted for CVA.  He is currently medically stable, however due to his uninsured status awaiting SNF placement.  #L ACA/MCA watershed infarct  -Aspirin 81 mg daily  -Rosuvastatin 40 mg daily   #HTN -Losartan 50 mg daily -Amlodipine 10 mg daily -Metoprolol 25 mg daily -Hydralazine 75 mg daily   #Dermatitis: Peeling and erythematous skin at the bridge of the nose yesterday that has improved today. -Loratadine 10 mg  -Hydrocortisone cream BID   #FEN/GI -Diet: Regular -Fluids: None  #DVT prophylaxis -Lovenox 40 mg subq injections daily  #CODE STATUS: FULL  #Dispo: Anticipated discharge pending SNF placement.  41, MD Internal Medicine, PGY1 Pager: [phone]  04/12/2019,10:41 AM

## 2019-04-12 NOTE — Progress Notes (Signed)
Physical Therapy Treatment Patient Details Name: Philip Vasquez MRN: [id number] DOB: Nov 04, 1960 Today's Date: 04/12/2019    History of Present Illness [name] is a 59 year old gentleman with no past medical history who presented with AMS and right-sided weakness. MRI: Widely scattered small acute infarcts in the medial lefthemisphere involving left ACA and left ACA/MCA watershed area.    PT Comments    Pt supine soiled in urine on arrival.  Pt required assistance to roll for pericare clean up and to move to a seated position edge of bed.  Once in sitting he was heavily reliant on UE support but progressed to supervision in sitting,  He sat edge of bed to perform ADLs.  Pt required max encouragement to participate and declined OOB activity.  He continues to benefit from SNF based on presentation.     Follow Up Recommendations  SNF;Supervision/Assistance - 24 hour     Equipment Recommendations  Wheelchair (measurements PT);Wheelchair cushion (measurements PT);Hospital bed;Other (comment)    Recommendations for Other Services Rehab consult     Precautions / Restrictions Precautions Precautions: Fall Precaution Comments: R UE/LE weakness/tone Restrictions Weight Bearing Restrictions: No    Mobility  Bed Mobility Overal bed mobility: Needs Assistance Bed Mobility: Supine to Sit;Sit to Supine;Rolling Rolling: Mod assist;+2 for safety/equipment   Supine to sit: Mod assist;+2 for physical assistance Sit to supine: Mod assist;+2 for physical assistance   General bed mobility comments: Mod assistance for rolling LE advancement and trunk elevation.  To return back to bed he required lifting of B LEs back to bed and assistance in postioning.  Pt left sitting in chair position in bed.  Transfers Overall transfer level: (refused OOB transfers,)                  Ambulation/Gait                 Stairs             Wheelchair Mobility    Modified Rankin (Stroke  Patients Only)       Balance Overall balance assessment: Needs assistance Sitting-balance support: Feet supported;Single extremity supported Sitting balance-Leahy Scale: Poor Sitting balance - Comments: reliant on LUE support on bed rail; progressing to supervision level                                    Cognition Arousal/Alertness: Awake/alert Behavior During Therapy: Flat affect Overall Cognitive Status: Impaired/Different from baseline Area of Impairment: Following commands;Safety/judgement;Awareness;Problem solving                 Orientation Level: Disoriented to;Time Current Attention Level: Selective Memory: Decreased recall of precautions;Decreased short-term memory Following Commands: Follows one step commands with increased time Safety/Judgement: Decreased awareness of safety;Decreased awareness of deficits Awareness: Intellectual Problem Solving: Difficulty sequencing;Requires verbal cues;Requires tactile cues;Slow processing General Comments: Self limiting behaviors; decreased interaction/engagement with therapist; he would report no at times and then start participating in activities.      Exercises      General Comments        Pertinent Vitals/Pain Pain Assessment: Faces Faces Pain Scale: Hurts little more Pain Location: R shoulder with flexion Pain Descriptors / Indicators: Grimacing;Guarding Pain Intervention(s): Monitored during session;Repositioned    Home Living                      Prior Function  PT Goals (current goals can now be found in the care plan section) Acute Rehab PT Goals Patient Stated Goal: pt did not state Potential to Achieve Goals: Fair Progress towards PT goals: Not progressing toward goals - comment(due to self limiting behavior.)    Frequency    Min 1X/week      PT Plan Current plan remains appropriate    Co-evaluation PT/OT/SLP Co-Evaluation/Treatment: Yes Reason for  Co-Treatment: Complexity of the patient's impairments (multi-system involvement) PT goals addressed during session: Mobility/safety with mobility OT goals addressed during session: ADL's and self-care      AM-PAC PT "6 Clicks" Mobility   Outcome Measure  Help needed turning from your back to your side while in a flat bed without using bedrails?: A Lot Help needed moving from lying on your back to sitting on the side of a flat bed without using bedrails?: A Lot Help needed moving to and from a bed to a chair (including a wheelchair)?: Total Help needed standing up from a chair using your arms (e.g., wheelchair or bedside chair)?: Total Help needed to walk in hospital room?: Total Help needed climbing 3-5 steps with a railing? : Total 6 Click Score: 8    End of Session   Activity Tolerance: Other (comment)(remains to be very self limiting.)   Nurse Communication: Mobility status PT Visit Diagnosis: Other abnormalities of gait and mobility (R26.89);Hemiplegia and hemiparesis;Muscle weakness (generalized) (M62.81) Hemiplegia - Right/Left: Right Hemiplegia - dominant/non-dominant: Dominant Hemiplegia - caused by: Cerebral infarction     Time: 1533-1600 PT Time Calculation (min) (ACUTE ONLY): 27 min  Charges:  $Therapeutic Activity: 8-22 mins                     Bonney Leitz , PTA Acute Rehabilitation Services Pager [phone] Office [phone]     Desiraye Rolfson Artis Delay 04/12/2019, 4:32 PM

## 2019-04-12 NOTE — TOC Progression Note (Signed)
Transition of Care [facility]) - Progression Note    Patient Details  Name: Philip Vasquez MRN: [id number] Date of Birth: December 10, 1960  [facility] of [facility] CM/SW Contact  Baldemar Lenis, [place] Phone Number: 04/12/2019, 10:33 AM  Clinical Narrative:   CSW continuing to follow for SNF placement. Patient continues to have no bed offers due to no payor source. CSW to follow.    Expected Discharge Plan: Skilled Nursing Facility Barriers to Discharge: SNF Pending payor source - LOG, SNF Pending bed offer, Inadequate or no insurance, Active Substance Use - Placement  Expected Discharge Plan and Services Expected Discharge Plan: Skilled Nursing Facility In-house Referral: Clinical Social Work Discharge Planning Services: CM Consult                                           Social Determinants of Health (SDOH) Interventions    Readmission Risk Interventions No flowsheet data found.

## 2019-04-12 NOTE — Progress Notes (Signed)
Occupational Therapy Treatment Patient Details Name: Philip Vasquez MRN: [id number] DOB: Aug 09, 1960 Today's Date: 04/12/2019    History of present illness Philip Vasquez is a 59 year old gentleman with no past medical history who presented with AMS and right-sided weakness. MRI: Widely scattered small acute infarcts in the medial lefthemisphere involving left ACA and left ACA/MCA watershed area.   OT comments  Pt making slow progress towards OT goals, pt with self-limiting behaviors and requiring max encouragement to participate in session. Pt requiring maxA for bathing ADL and pericare at bed level, +13modA to perform bed mobility and pt seated EOB for additional UB/LB ADL. Pt overall requiring maxA for LB ADL, minA for seated grooming ADL. Acute OT goal date updated to reflect pt's progress as current acute OT goals remain appropriate. Continue to recommend SNF level therapies at time of discharge. Will continue to follow acutely.   Follow Up Recommendations  SNF;Supervision/Assistance - 24 hour    Equipment Recommendations  3 in 1 bedside commode          Precautions / Restrictions Precautions Precautions: Fall Precaution Comments: R UE/LE weakness/tone Restrictions Weight Bearing Restrictions: No       Mobility Bed Mobility Overal bed mobility: Needs Assistance Bed Mobility: Supine to Sit;Sit to Supine;Rolling Rolling: Mod assist;+2 for safety/equipment   Supine to sit: Mod assist;+2 for physical assistance Sit to supine: Mod assist;+2 for physical assistance   General bed mobility comments: Mod assistance for rolling LE advancement and trunk elevation.  To return back to bed he required lifting of B LEs back to bed and assistance in postioning.  Pt left sitting in chair position in bed.  Transfers Overall transfer level: (refused OOB transfers,)               General transfer comment: deferred by pt    Balance Overall balance assessment: Needs  assistance Sitting-balance support: Feet supported;Single extremity supported Sitting balance-Leahy Scale: Poor Sitting balance - Comments: reliant on LUE support on bed rail; progressing to supervision level                                   ADL either performed or assessed with clinical judgement   ADL Overall ADL's : Needs assistance/impaired     Grooming: Applying deodorant;Wash/dry face;Moderate assistance;Sitting;Bed level   Upper Body Bathing: Moderate assistance;Bed level   Lower Body Bathing: Maximal assistance;Bed level;+2 for safety/equipment   Upper Body Dressing : Moderate assistance;Bed level Upper Body Dressing Details (indicate cue type and reason): donning new gown Lower Body Dressing: Maximal assistance;Bed level;Sitting/lateral leans Lower Body Dressing Details (indicate cue type and reason): given MAX cues/encouragement and with physical facilitation to place RLE into figure 4 pt able to pull sock over heel once started over toes     Toileting- Clothing Manipulation and Hygiene: Total assistance;Bed level Toileting - Clothing Manipulation Details (indicate cue type and reason): for perciare       General ADL Comments: pt continues to be self-limiting and requires max encouragement to participate in ADL/mobility tasks, assisted with bathing/pericare initially as pt with soiled linens, seated EOB pt washing hair and donning new socks with max encouragement to complete all activities      Vision       Perception     Praxis      Cognition Arousal/Alertness: Awake/alert Behavior During Therapy: Flat affect Overall Cognitive Status: Impaired/Different from baseline Area of Impairment: Following commands;Safety/judgement;Awareness;Problem solving  Orientation Level: Disoriented to;Time Current Attention Level: Selective Memory: Decreased recall of precautions;Decreased short-term memory Following Commands: Follows one  step commands with increased time Safety/Judgement: Decreased awareness of safety;Decreased awareness of deficits Awareness: Intellectual Problem Solving: Difficulty sequencing;Requires verbal cues;Requires tactile cues;Slow processing General Comments: Self limiting behaviors; decreased interaction/engagement with therapist; he would report no at times and then start participating in activities.        Exercises     Shoulder Instructions       General Comments      Pertinent Vitals/ Pain       Pain Assessment: Faces Faces Pain Scale: Hurts little more Pain Location: R shoulder with flexion Pain Descriptors / Indicators: Grimacing;Guarding Pain Intervention(s): Limited activity within patient's tolerance;Monitored during session;Repositioned  Home Living                                          Prior Functioning/Environment              Frequency  Min 2X/week        Progress Toward Goals  OT Goals(current goals can now be found in the care plan section)  Progress towards OT goals: Progressing toward goals  Acute Rehab OT Goals Patient Stated Goal: pt did not state OT Goal Formulation: With patient Time For Goal Achievement: 04/26/19 Potential to Achieve Goals: Fair ADL Goals Pt Will Perform Grooming: with set-up;sitting Pt Will Perform Upper Body Bathing: with set-up;sitting Pt Will Transfer to Toilet: stand pivot transfer;bedside commode;with mod assist Pt/caregiver will Perform Home Exercise Program: Increased ROM;Right Upper extremity;With written HEP provided Additional ADL Goal #1: Pt will be able to roll left and right with min A to help with basic ADLs Additional ADL Goal #2: Pt will be able to come up to sit EOB with min A in prep for transfers and EOB ADLs Additional ADL Goal #3: Pt will scan environment for appropriate items for functional tasks with minimal cueing.  Plan Discharge plan remains appropriate    Co-evaluation     PT/OT/SLP Co-Evaluation/Treatment: Yes Reason for Co-Treatment: To address functional/ADL transfers;Complexity of the patient's impairments (multi-system involvement) PT goals addressed during session: Mobility/safety with mobility OT goals addressed during session: ADL's and self-care      AM-PAC OT "6 Clicks" Daily Activity     Outcome Measure   Help from another person eating meals?: A Little Help from another person taking care of personal grooming?: A Lot Help from another person toileting, which includes using toliet, bedpan, or urinal?: Total Help from another person bathing (including washing, rinsing, drying)?: A Lot Help from another person to put on and taking off regular upper body clothing?: A Little Help from another person to put on and taking off regular lower body clothing?: A Lot 6 Click Score: 13    End of Session    OT Visit Diagnosis: Hemiplegia and hemiparesis;Other symptoms and signs involving cognitive function Symptoms and signs involving cognitive functions: Cerebral infarction Hemiplegia - Right/Left: Right Hemiplegia - dominant/non-dominant: Dominant Hemiplegia - caused by: Cerebral infarction   Activity Tolerance Other (comment);Treatment limited secondary to agitation(pt self limiting )   Patient Left in bed;with call bell/phone within reach;with bed alarm set;with nursing/sitter in room   Nurse Communication Mobility status        Time: 8341-9622 OT Time Calculation (min): 25 min  Charges: OT General Charges $OT Visit: 1 Visit OT  Treatments $Self Care/Home Management : 8-22 mins  Philip Vasquez, OT Supplemental Rehabilitation Services Pager [phone] Office [phone]    Philip Vasquez 04/12/2019, 5:35 PM

## 2019-04-13 NOTE — Progress Notes (Addendum)
   Subjective:   Pt seen at the bedside this AM.  No complaints at this time.   Objective:  Vital signs in last 24 hours: Vitals:   04/12/19 1650 04/12/19 2005 04/13/19 0020 04/13/19 0323  BP: (!) 128/95 124/84 121/84 122/87  Pulse: 68 65 66 (!) 59  Resp: 18 16 16 16   Temp: 98.4 F (36.9 C) 98.2 F (36.8 C) 98.1 F (36.7 C) 97.8 F (36.6 C)  TempSrc: Oral Oral Oral Oral  SpO2: 99% 97% 99% 97%  Weight:      Height:       Physical Exam General: Resting in bed watching TV HEENT: NCAT CV: RRR, normal S1-S2 no murmurs rubs or gallops appreciated PULM: Normal work of breathing ABD: Soft and nontender NEURO: A&O x3, right-sided upper and lower extremity weakness present  Assessment/Plan:  Principal Problem:   Cerebral thrombosis with cerebral infarction Active Problems:   Right sided weakness   Anemia of chronic disease   Urinary tract infection  In summary, Philip Vasquez is a 59 year old male with a past medical history of substance use who was admitted for CVA.  He is currently medically stable, however due to his uninsured status awaiting SNF placement.  #L ACA/MCA watershed infarct  -Aspirin 81 mg daily  -Rosuvastatin 40 mg daily   #HTN -Losartan 50 mg daily -Amlodipine 10 mg daily -Metoprolol 25 mg daily -Hydralazine 75 mg daily   #FEN/GI -Diet: Regular -Fluids: None  #DVT prophylaxis -Lovenox 40 mg subq injections daily  #CODE STATUS: FULL  #Dispo: Anticipated discharge pending SNF placement.  41, MD Internal Medicine, PGY1 Pager: [phone]  04/13/2019,11:05 AM

## 2019-04-14 NOTE — Progress Notes (Signed)
   Subjective:   Pt seen at the bedside this AM.  We discussed the [place] playoff match ups.  No complaints at this time.   Objective:  Vital signs in last 24 hours: Vitals:   04/13/19 1646 04/13/19 2031 04/13/19 2355 04/14/19 0443  BP: 108/77 119/77 107/69 118/83  Pulse: 85 88 76 64  Resp:  18 20 18   Temp:  98 F (36.7 C) 98.4 F (36.9 C) 97.8 F (36.6 C)  TempSrc:  Oral Oral Oral  SpO2: 100% 97% 98% 97%  Weight:      Height:       Physical Exam General: Resting in bed watching TV HEENT: NCAT CV: RRR, normal S1-S2 no murmurs rubs or gallops appreciated PULM: Normal work of breathing ABD: Soft and nontender NEURO: A&O x3, right-sided upper and lower extremity weakness present  Assessment/Plan:  Principal Problem:   Cerebral thrombosis with cerebral infarction Active Problems:   Right sided weakness   Anemia of chronic disease   Urinary tract infection  In summary, Philip Vasquez is a 59 year old male with a past medical history of substance use who was admitted for CVA.  He is currently medically stable, however due to his uninsured status awaiting SNF placement.  #L ACA/MCA watershed infarct  -Aspirin 81 mg daily  -Rosuvastatin 40 mg daily   #HTN -Losartan 50 mg daily -Amlodipine 10 mg daily -Metoprolol 25 mg daily -Hydralazine 75 mg daily   #FEN/GI -Diet: Regular -Fluids: None  #DVT prophylaxis -Lovenox 40 mg subq injections daily  #CODE STATUS: FULL  #Dispo: Anticipated discharge pending SNF placement.  41, MD Internal Medicine, PGY1 Pager: [phone]  04/14/2019,10:08 AM

## 2019-04-15 NOTE — Progress Notes (Signed)
   Subjective:   Pt seen at the bedside this AM. Pt says he feels well and has no complaints at this time.   Objective:  Vital signs in last 24 hours: Vitals:   04/14/19 1615 04/14/19 2007 04/14/19 2343 04/15/19 0338  BP: 121/90 129/87 122/86 122/79  Pulse: 65 66 76 81  Resp: 16 18 18 18   Temp: 98.8 F (37.1 C) 98.6 F (37 C) 98.5 F (36.9 C) 98.3 F (36.8 C)  TempSrc: Oral Oral Oral Oral  SpO2: 97% 98% 99% 100%  Weight:      Height:       Physical Exam General: Resting in bed watching TV HEENT: NCAT CV: RRR, normal S1-S2 no murmurs rubs or gallops appreciated PULM: Normal work of breathing, lung sounds in all field, no crackles ABD: Soft and nontender to palpation  NEURO: A&O x3, right-sided upper and lower extremity weakness present. Moves all extremities antigravity   Assessment/Plan:  Principal Problem:   Cerebral thrombosis with cerebral infarction Active Problems:   Right sided weakness   Anemia of chronic disease   Urinary tract infection  In summary, Philip Vasquez is a 59 year old male with a past medical history of substance use who was admitted for CVA. He is currently medically stable for discharge, however due to his uninsured status awaiting SNF placement.  #L ACA/MCA watershed infarct  -Aspirin 81 mg daily  -Rosuvastatin 40 mg daily   #HTN -Losartan 50 mg daily -Amlodipine 10 mg daily -Metoprolol 25 mg daily -Hydralazine 75 mg daily   #FEN/GI -Diet: Regular -Fluids: None  #DVT prophylaxis -Lovenox 40 mg subq injections daily  #CODE STATUS: FULL  #Dispo: Medically stable for d/c. Anticipated discharge pending SNF placement.  41, MD Internal Medicine, PGY1 Pager: [phone]  04/15/2019,2:18 PM

## 2019-04-16 LAB — CBC
HCT: 38.4 % — ABNORMAL LOW (ref 39.0–52.0)
Hemoglobin: 12.2 g/dL — ABNORMAL LOW (ref 13.0–17.0)
MCH: 27 pg (ref 26.0–34.0)
MCHC: 31.8 g/dL (ref 30.0–36.0)
MCV: 85 fL (ref 80.0–100.0)
Platelets: 280 10*3/uL (ref 150–400)
RBC: 4.52 MIL/uL (ref 4.22–5.81)
RDW: 14.1 % (ref 11.5–15.5)
WBC: 5.5 10*3/uL (ref 4.0–10.5)
nRBC: 0 % (ref 0.0–0.2)

## 2019-04-16 LAB — BASIC METABOLIC PANEL
Anion gap: 11 (ref 5–15)
BUN: 24 mg/dL — ABNORMAL HIGH (ref 6–20)
CO2: 27 mmol/L (ref 22–32)
Calcium: 9.8 mg/dL (ref 8.9–10.3)
Chloride: 102 mmol/L (ref 98–111)
Creatinine, Ser: 1.34 mg/dL — ABNORMAL HIGH (ref 0.61–1.24)
GFR calc Af Amer: >60 mL/min (ref >60)
GFR calc non Af Amer: 58 mL/min — ABNORMAL LOW (ref >60)
Glucose, Bld: 100 mg/dL — ABNORMAL HIGH (ref 70–99)
Potassium: 4.4 mmol/L (ref 3.5–5.1)
Sodium: 140 mmol/L (ref 135–145)

## 2019-04-16 NOTE — Progress Notes (Signed)
   Subjective:   Pt seen at the bedside this AM. Just finished breakfast. No complaints at this time.   Objective:  Vital signs in last 24 hours: Vitals:   04/15/19 2340 04/16/19 0358 04/16/19 0745 04/16/19 1147  BP: 112/78 (!) 144/98 (!) 126/95 113/86  Pulse: 64 61 69 66  Resp: 18 18 16 20   Temp: 98.2 F (36.8 C) 98.1 F (36.7 C) 98.1 F (36.7 C) 98.8 F (37.1 C)  TempSrc: Oral Oral Oral Oral  SpO2: 95% 98% 97% 98%  Weight:      Height:       Physical Exam General: Resting in bed watching TV, finished breakfast. HEENT: NCAT CV: RRR, normal S1-S2 no murmurs rubs or gallops appreciated PULM: Normal work of breathing, lung sounds in all field, no crackles ABD: Soft and nontender to palpation  NEURO: A&O x3, right-sided upper and lower extremity weakness present. Moves all extremities antigravity   Assessment/Plan:  Principal Problem:   Cerebral thrombosis with cerebral infarction Active Problems:   Right sided weakness   Anemia of chronic disease   Urinary tract infection  In summary, [name] is a 59 year old male with a past medical history of substance use who was admitted for CVA. He is currently medically stable for discharge, however due to his uninsured status awaiting SNF placement.  #L ACA/MCA watershed infarct  -Aspirin 81 mg daily  -Rosuvastatin 40 mg daily   #HTN -Losartan 50 mg daily -Amlodipine 10 mg daily -Metoprolol 25 mg daily -Hydralazine 75 mg daily   #FEN/GI -Diet: Regular -Fluids: None  #DVT prophylaxis -Lovenox 40 mg subq injections daily  #CODE STATUS: FULL  #Dispo: Medically stable for d/c. Anticipated discharge pending SNF placement.  41, MD Internal Medicine, PGY1 Pager: [phone]  04/16/2019,12:13 PM

## 2019-04-17 NOTE — Progress Notes (Signed)
   Subjective: HD#119   Overnight: No acute events overnight  Today, Philip Vasquez states he is doing well and denies any complaints  Objective:  Vital signs in last 24 hours: Vitals:   04/16/19 1528 04/16/19 2002 04/17/19 0024 04/17/19 0359  BP: 112/89 119/77 112/61 118/89  Pulse: 71 74 69 69  Resp: 16 18 18 16   Temp: 98.6 F (37 C) 98.3 F (36.8 C) 98.2 F (36.8 C) 98.4 F (36.9 C)  TempSrc: Oral Oral Oral Oral  SpO2: 98% 98% 95% 98%  Weight:      Height:       Const: In no apparent distress, lying comfortably in bed, conversational CV: RRR, no murmurs, gallop, rub  Assessment/Plan:  Principal Problem:   Cerebral thrombosis with cerebral infarction Active Problems:   Right sided weakness   Anemia of chronic disease   Urinary tract infection  In summary, [name] is a 59 year old male with a past medical history of substance use who was admitted for CVA. He is currently medically stable for discharge, however due to his uninsured status awaiting SNF placement.  #L ACA/MCA watershed infarct  -Aspirin 81 mg daily  -Rosuvastatin 40 mg daily   #HTN -Losartan 50 mg daily -Amlodipine 10 mg daily -Metoprolol 25 mg daily -Hydralazine 75 mg daily   #FEN/GI -Diet: Regular -Fluids: None  #DVT prophylaxis -Lovenox 40 mg subq injections daily  #CODE STATUS: FULL  #Dispo: Medically stable for d/c. Anticipated discharge pending SNF placement.  41, MD 04/17/2019, 6:19 AM Pager: [phone] Internal Medicine Teaching Service

## 2019-04-18 DIAGNOSIS — G8194 Hemiplegia, unspecified affecting left nondominant side: Secondary | ICD-10-CM

## 2019-04-18 NOTE — Progress Notes (Signed)
   Subjective:   Pt was seen at the bedside this AM. No complaints today. S states he plans on watching NFL football playoffs today.  Objective:  Vital signs in last 24 hours: Vitals:   04/17/19 1606 04/17/19 2017 04/18/19 0000 04/18/19 0343  BP: 113/76 118/75 120/82 109/76  Pulse: 70 78 63 80  Resp: 18 18 18 18   Temp: 97.6 F (36.4 C) 97.8 F (36.6 C) 98.5 F (36.9 C) 98.4 F (36.9 C)  TempSrc: Oral Oral Oral Oral  SpO2: 99% 97% 96% 98%  Weight:      Height:       Physical Exam General: Comfortably resting in bed, watching TV HEENT: NCAT CV: RRR, normal S1-S2 no murmurs rubs or gallops appreciated PULM: Clear to auscultation bilaterally, no wheezes or crackles ABD: Soft and nontender in all quadrants NEURO: Alert and oriented, 4/5 left-sided weakness in upper and lower extremity.  5/5 in the right upper and lower extremity  Assessment/Plan:  Principal Problem:   Cerebral thrombosis with cerebral infarction Active Problems:   Right sided weakness   Anemia of chronic disease   Urinary tract infection  In summary, [name] is a 59 year old male with a past medical history of substance use who was admitted for CVA. He is currently medically stable for discharge, however due to his uninsured status awaiting SNF placement.  #L ACA/MCA watershed infarct  -Aspirin 81 mg daily  -Rosuvastatin 40 mg daily   #HTN -Losartan 50 mg daily -Amlodipine 10 mg daily -Metoprolol 25 mg daily -Hydralazine 75 mg daily   #FEN/GI -Diet: Regular -Fluids: None  #DVT prophylaxis -Lovenox 40 mg subq injections daily  #CODE STATUS: FULL  #Dispo: Medically stable for d/c. Anticipated discharge pending SNF placement.  41, MD 04/18/2019, 6:20 AM Pager: [phone] Internal Medicine Teaching Service

## 2019-04-19 NOTE — Progress Notes (Signed)
   Subjective:   Pt seen at the bedside. The patient has no complaints today.   Objective:  Vital signs in last 24 hours: Vitals:   04/18/19 1927 04/18/19 2336 04/19/19 0344 04/19/19 0725  BP: 124/79 108/73 109/66 129/87  Pulse: 74  71 64  Resp: 17 18 18 18   Temp: 97.6 F (36.4 C) 97.6 F (36.4 C) 98 F (36.7 C) 98.2 F (36.8 C)  TempSrc: Oral Oral Oral Oral  SpO2: 99% 97% 98% 100%  Weight:      Height:       Physical Exam General: Comfortably resting in bed, watching TV HEENT: NCAT CV: RRR, normal S1-S2 no murmurs rubs or gallops appreciated PULM: Clear to auscultation bilaterally, no wheezes or crackles ABD: Soft and nontender in all quadrants NEURO: Alert and oriented, 4/5 left-sided weakness in upper and lower extremity.  5/5 in the right upper and lower extremity  Assessment/Plan:  Principal Problem:   Cerebral thrombosis with cerebral infarction Active Problems:   Right sided weakness   Anemia of chronic disease   Urinary tract infection  In summary, Philip Vasquez is a 59 year old male with a past medical history of substance use who was admitted for CVA. He is currently medically stable for discharge, however due to his uninsured status awaiting SNF placement.  #L ACA/MCA watershed infarct  -Aspirin 81 mg daily  -Rosuvastatin 40 mg daily   #HTN -Losartan 50 mg daily -Amlodipine 10 mg daily -Metoprolol 25 mg daily -Hydralazine 75 mg daily   #FEN/GI -Diet: Regular -Fluids: None  #DVT prophylaxis -Lovenox 40 mg subq injections daily  #CODE STATUS: FULL  #Dispo: Medically stable for d/c. Anticipated discharge pending SNF placement.  41, MD Internal Medicine, PGY1 Pager: [phone]  04/19/2019,11:26 AM

## 2019-04-19 NOTE — Progress Notes (Signed)
Physical Therapy Treatment Patient Details Name: Philip Vasquez MRN: [id number] DOB: 04-26-1960 Today's Date: 04/19/2019    History of Present Illness Philip Vasquez is a 59 year old gentleman with no past medical history who presented with AMS and right-sided weakness. MRI: Widely scattered small acute infarcts in the medial lefthemisphere involving left ACA and left ACA/MCA watershed area.    PT Comments    Pt performed bed mobility and transfer training this session with good participation.  He initially refused but after PTA educated on what exactly the plan was he was more receptive and participated well.  He continues to benefit from SNF placement based on continued need for external assistance.  Plan next session for OOB to [facility] for WC mobility.    Follow Up Recommendations  SNF;Supervision/Assistance - 24 hour     Equipment Recommendations  Wheelchair (measurements PT);Wheelchair cushion (measurements PT);Hospital bed;Other (comment)    Recommendations for Other Services Rehab consult     Precautions / Restrictions Precautions Precautions: Fall Precaution Comments: R UE/LE weakness/tone Restrictions Weight Bearing Restrictions: No    Mobility  Bed Mobility Overal bed mobility: Needs Assistance Bed Mobility: Supine to Sit;Sit to Supine Rolling: Mod assist     Sit to supine: Mod assist   General bed mobility comments: Mod assistance to move to edge of bed and assist LEs and elevate trunk into sitting.  Pt required assistance to return to bed.  Pt required lifting of B LEs to edge of bed. POst back to bed placed in chair position.  Transfers Overall transfer level: Needs assistance Equipment used: Ambulation equipment used(sara stedy) Transfers: Sit to/from Stand Sit to Stand: Mod assist         General transfer comment: Pt performed sit to stand with multimodal cueing and assistance to boost into standing.  He presents with stool incontinence and pericare performed in  standing.  He tolerated standing x 6 min.  R side started to tremor as he began to fatigue.  Ambulation/Gait Ambulation/Gait assistance: (NT)               Stairs             Wheelchair Mobility    Modified Rankin (Stroke Patients Only)       Balance Overall balance assessment: Needs assistance Sitting-balance support: Feet supported;Single extremity supported Sitting balance-Leahy Scale: Poor Sitting balance - Comments: reliant on LUE support on bed rail; progressing to supervision level Postural control: Posterior lean Standing balance support: Bilateral upper extremity supported Standing balance-Leahy Scale: Poor Standing balance comment: min to mod assistance to maintain standing.                            Cognition Arousal/Alertness: Awake/alert Behavior During Therapy: Flat affect Overall Cognitive Status: Impaired/Different from baseline Area of Impairment: Following commands;Safety/judgement;Awareness;Problem solving                 Orientation Level: Disoriented to;Time Current Attention Level: Selective Memory: Decreased recall of precautions;Decreased short-term memory Following Commands: Follows one step commands with increased time Safety/Judgement: Decreased awareness of safety;Decreased awareness of deficits Awareness: Intellectual Problem Solving: Difficulty sequencing;Requires verbal cues;Requires tactile cues;Slow processing General Comments: Better caryover this session with education on duration and what activities are going to be performed this session.      Exercises      General Comments        Pertinent Vitals/Pain Pain Assessment: Faces Faces Pain Scale: Hurts little more Pain Location:  R shoulder with flexion Pain Descriptors / Indicators: Grimacing;Guarding Pain Intervention(s): Repositioned    Home Living                      Prior Function            PT Goals (current goals can now be  found in the care plan section) Acute Rehab PT Goals Patient Stated Goal: pt did not state Potential to Achieve Goals: Fair Additional Goals Additional Goal #1: Min assist wheelchair mobility x 50 feet, straight/level/obstacle free surface. Progress towards PT goals: Progressing toward goals    Frequency    Min 1X/week      PT Plan Current plan remains appropriate    Co-evaluation              AM-PAC PT "6 Clicks" Mobility   Outcome Measure  Help needed turning from your back to your side while in a flat bed without using bedrails?: A Lot Help needed moving from lying on your back to sitting on the side of a flat bed without using bedrails?: A Lot Help needed moving to and from a bed to a chair (including a wheelchair)?: A Lot Help needed standing up from a chair using your arms (e.g., wheelchair or bedside chair)?: A Lot Help needed to walk in hospital room?: Total Help needed climbing 3-5 steps with a railing? : Total 6 Click Score: 10    End of Session Equipment Utilized During Treatment: Gait belt Activity Tolerance: Patient tolerated treatment well Patient left: in bed;with bed alarm set;with call bell/phone within reach Nurse Communication: Mobility status PT Visit Diagnosis: Other abnormalities of gait and mobility (R26.89);Hemiplegia and hemiparesis;Muscle weakness (generalized) (M62.81) Hemiplegia - Right/Left: Right Hemiplegia - dominant/non-dominant: Dominant Hemiplegia - caused by: Cerebral infarction     Time: 6433-2951 PT Time Calculation (min) (ACUTE ONLY): 15 min  Charges:  $Therapeutic Activity: 8-22 mins                     Bonney Leitz , PTA Acute Rehabilitation Services Pager [phone] Office [phone]     Kaven Cumbie Artis Delay 04/19/2019, 2:37 PM

## 2019-04-19 NOTE — Progress Notes (Signed)
PT Progress Note  PT goals reviewed and updated as needed. New wheelchair goal added.  Aida Raider, PT  Office # [phone] Pager [phone]

## 2019-04-19 NOTE — TOC Progression Note (Signed)
Transition of Care [facility]) - Progression Note    Patient Details  Name: Philip Vasquez MRN: [id number] Date of Birth: 1960/09/23  [facility] CM/SW Contact  Kermit Balo, RN Phone Number: 04/19/2019, 10:48 AM  Clinical Narrative:    Disability still pending and no bed offers. TOC following.   Expected Discharge Plan: Skilled Nursing Facility Barriers to Discharge: SNF Pending payor source - LOG, SNF Pending bed offer, Inadequate or no insurance, Active Substance Use - Placement  Expected Discharge Plan and Services Expected Discharge Plan: Skilled Nursing Facility In-house Referral: Clinical Social Work Discharge Planning Services: CM Consult                                           Social Determinants of Health (SDOH) Interventions    Readmission Risk Interventions No flowsheet data found.

## 2019-04-20 MED ORDER — POLYETHYLENE GLYCOL 3350 17 G PO PACK
17.0000 g | PACK | Freq: Every day | ORAL | Status: DC
  Administered 2019-04-20 – 2019-06-14 (×17): 17 g via ORAL
  Filled 2019-04-20 (×45): qty 1

## 2019-04-20 NOTE — Progress Notes (Signed)
Occupational Therapy Treatment Patient Details Name: Philip Vasquez MRN: [id number] DOB: 1960-05-17 Today's Date: 04/20/2019    History of present illness Philip Vasquez is a 59 year old gentleman with no past medical history who presented with AMS and right-sided weakness. MRI: Widely scattered small acute infarcts in the medial lefthemisphere involving left ACA and left ACA/MCA watershed area.   OT comments  Upon arrival pt soiled, required maxA to roll toward R and minguard to roll toward L with totalA for posterior pericare. Pt initially agreeable to OT session, but after pericare pt adamantly declined. Pt only answering yes/no/I don't know to questions, pt answered yes when asked if he was unmotivated. He also responded "yes" when asked if he felt like he was giving up. Therapist has asked this question in previous session and pt has responded "no". Feel pt would benefit from Palliative Consult. Frequency updated this date as pt frequently declines further mobility with occupational therapy. Pt will continue to benefit from skilled OT services to maximize safety and independence with ADL/IADL and functional mobility. Will continue to follow acutely and progress as tolerated.    Follow Up Recommendations  SNF;Supervision/Assistance - 24 hour    Equipment Recommendations  3 in 1 bedside commode    Recommendations for Other Services Other (comment)(Palliative)    Precautions / Restrictions Precautions Precautions: Fall Precaution Comments: R UE/LE weakness/tone       Mobility Bed Mobility Overal bed mobility: Needs Assistance Bed Mobility: Rolling Rolling: Min guard;Max assist         General bed mobility comments: minguard to roll toward the L, maxA to roll toward the R  Transfers                 General transfer comment: pt adamantly declined    Balance                                           ADL either performed or assessed with clinical judgement    ADL Overall ADL's : Needs assistance/impaired                         Toilet Transfer: Maximal assistance Toilet Transfer Details (indicate cue type and reason): pt soiled upon arrival;maxA to roll toward the left, minguard to roll toward the R Toileting- Clothing Manipulation and Hygiene: Total assistance Toileting - Clothing Manipulation Details (indicate cue type and reason): for pericare       General ADL Comments: pt with self-limiting behaviors      Vision   Vision Assessment?: Vision impaired- to be further tested in functional context Additional Comments: decreased visual attention difficult to assess   Perception     Praxis      Cognition Arousal/Alertness: Awake/alert Behavior During Therapy: Flat affect Overall Cognitive Status: Impaired/Different from baseline Area of Impairment: Following commands;Safety/judgement;Awareness;Problem solving                       Following Commands: Follows one step commands with increased time Safety/Judgement: Decreased awareness of safety;Decreased awareness of deficits Awareness: Intellectual Problem Solving: Difficulty sequencing;Requires verbal cues;Requires tactile cues;Slow processing General Comments: pt continues to answer yes/no/I don't know to open ended questions;pt initially agreeable to participate in OT session, following pericare pt declined and requested OT "leave him alone and not come back";When asked why pt replied "I don't know", pt  asked if he felt like he was giving up to which he answered "yes" but was unable to state why        Exercises     Shoulder Instructions       General Comments vss    Pertinent Vitals/ Pain       Pain Assessment: Faces Faces Pain Scale: Hurts whole lot Pain Location: R knee with movement Pain Descriptors / Indicators: Guarding;Grimacing Pain Intervention(s): Limited activity within patient's tolerance;Monitored during session  Home Living                                           Prior Functioning/Environment              Frequency  Min 2X/week        Progress Toward Goals  OT Goals(current goals can now be found in the care plan section)  Progress towards OT goals: Not progressing toward goals - comment(self-limiting )  Acute Rehab OT Goals Patient Stated Goal: pt did not state OT Goal Formulation: With patient Time For Goal Achievement: 04/26/19 Potential to Achieve Goals: Fair ADL Goals Pt Will Perform Grooming: with set-up;sitting Pt Will Perform Upper Body Bathing: with set-up;sitting Pt Will Transfer to Toilet: stand pivot transfer;bedside commode;with mod assist Pt/caregiver will Perform Home Exercise Program: Increased ROM;Right Upper extremity;With written HEP provided Additional ADL Goal #1: Pt will be able to roll left and right with min A to help with basic ADLs Additional ADL Goal #2: Pt will be able to come up to sit EOB with min A in prep for transfers and EOB ADLs Additional ADL Goal #3: Pt will scan environment for appropriate items for functional tasks with minimal cueing.  Plan Discharge plan remains appropriate    Co-evaluation                 AM-PAC OT "6 Clicks" Daily Activity     Outcome Measure   Help from another person eating meals?: A Little Help from another person taking care of personal grooming?: A Lot Help from another person toileting, which includes using toliet, bedpan, or urinal?: Total Help from another person bathing (including washing, rinsing, drying)?: A Lot Help from another person to put on and taking off regular upper body clothing?: A Little Help from another person to put on and taking off regular lower body clothing?: A Lot 6 Click Score: 13    End of Session    OT Visit Diagnosis: Hemiplegia and hemiparesis;Other symptoms and signs involving cognitive function Symptoms and signs involving cognitive functions: Cerebral  infarction Hemiplegia - Right/Left: Right Hemiplegia - dominant/non-dominant: Dominant Hemiplegia - caused by: Cerebral infarction   Activity Tolerance Other (comment)(treatment limited to pt declining )   Patient Left in bed;with call bell/phone within reach;with bed alarm set;with nursing/sitter in room   Nurse Communication Mobility status        Time: 1427-1500 OT Time Calculation (min): 33 min  Charges: OT General Charges $OT Visit: 1 Visit OT Treatments $Self Care/Home Management : 23-37 mins  Diona Browner OTR/L Acute Rehabilitation Services Office: [phone]    Rebeca Alert 04/20/2019, 4:30 PM

## 2019-04-20 NOTE — Progress Notes (Signed)
   Subjective:   Pt seen at the bedside.  Patient states he wants the [employer] trip last night.  No complaints at this time.  He states that he does not remember B the bowel movement.  Objective:  Vital signs in last 24 hours: Vitals:   04/19/19 2039 04/19/19 2039 04/19/19 2356 04/20/19 0410  BP: 134/83 134/83 120/87 109/82  Pulse: 79 79 73 60  Resp: 16 18 17 18   Temp: 98.9 F (37.2 C) 98.9 F (37.2 C) 98.2 F (36.8 C) 98 F (36.7 C)  TempSrc: Oral Oral Oral Oral  SpO2: 97% 97% 97% 99%  Weight:      Height:       Physical Exam General: Comfortably resting in bed, watching TV HEENT: NCAT CV: RRR, normal S1-S2 no murmurs rubs or gallops appreciated PULM: Clear to auscultation bilaterally, no wheezes or crackles ABD: Soft and nontender in all quadrants NEURO: Alert and oriented, 4/5 left-sided weakness in upper and lower extremity.  5/5 in the right upper and lower extremity  Assessment/Plan:  Principal Problem:   Cerebral thrombosis with cerebral infarction Active Problems:   Right sided weakness   Anemia of chronic disease   Urinary tract infection  In summary, Philip Vasquez is a 59 year old male with a past medical history of substance use who was admitted for CVA. He is currently medically stable for discharge, however due to his uninsured status awaiting SNF placement.  #L ACA/MCA watershed infarct  -Aspirin 81 mg daily  -Rosuvastatin 40 mg daily   #HTN -Losartan 50 mg daily -Amlodipine 10 mg daily -Metoprolol 25 mg daily -Hydralazine 75 mg daily   #FEN/GI -Diet: Regular -Fluids: None -MiraLAX daily -Senokot 2 tablets daily  #DVT prophylaxis -Lovenox 40 mg subq injections daily  #CODE STATUS: FULL  #Dispo: Medically stable for d/c. Anticipated discharge pending SNF placement.  41, MD Internal Medicine, PGY1 Pager: [phone]  04/20/2019,9:20 AM

## 2019-04-21 NOTE — Progress Notes (Signed)
   Subjective:   Pt seen at the bedside.  No complaints today.   Objective:  Vital signs in last 24 hours: Vitals:   04/20/19 2035 04/21/19 0018 04/21/19 0424 04/21/19 0839  BP: 123/88 101/78 125/86 120/86  Pulse: 68 70 (!) 57 68  Resp: 18 18 16 17   Temp: 98.1 F (36.7 C) 98.5 F (36.9 C) 97.7 F (36.5 C) 98.3 F (36.8 C)  TempSrc: Oral Oral Oral Oral  SpO2: 97% 98% 97% 100%  Weight:      Height:       Physical Exam General: Comfortably resting and laying in bed, watching TV HEENT: NCAT CV: RRR, normal S1-S2 no murmurs rubs or gallops appreciated PULM: Clear to auscultation bilaterally, no wheezes or crackles ABD: Soft and nontender in all quadrants NEURO: Alert and oriented, 4/5 left-sided weakness in upper and lower extremity.  5/5 in the right upper and lower extremity  Assessment/Plan:  Principal Problem:   Cerebral thrombosis with cerebral infarction Active Problems:   Right sided weakness   Anemia of chronic disease   Urinary tract infection  In summary, Mr. Hence is a 59 year old male with a past medical history of substance use who was admitted for CVA. He is currently medically stable for discharge, however due to his uninsured status awaiting SNF placement.  #L ACA/MCA watershed infarct  -Aspirin 81 mg daily  -Rosuvastatin 40 mg daily   #HTN -Losartan 50 mg daily -Amlodipine 10 mg daily -Metoprolol 25 mg daily -Hydralazine 75 mg daily   #FEN/GI -Diet: Regular -Fluids: None -MiraLAX daily -Senokot 2 tablets daily  #DVT prophylaxis -Lovenox 40 mg subq injections daily  #CODE STATUS: FULL  #Dispo: Medically stable for d/c. Anticipated discharge pending SNF placement.  41, MD Internal Medicine, PGY1 Pager: [phone]  04/21/2019,9:14 AM

## 2019-04-22 MED ORDER — ENOXAPARIN SODIUM 80 MG/0.8ML ~~LOC~~ SOLN
0.5000 mg/kg | SUBCUTANEOUS | Status: DC
  Administered 2019-04-22 – 2019-05-16 (×25): 65 mg via SUBCUTANEOUS
  Filled 2019-04-22 (×25): qty 0.8

## 2019-04-22 NOTE — Plan of Care (Signed)
Problem: Education: Goal: Knowledge of General Education information will improve Description: Including pain rating scale, medication(s)/side effects and non-pharmacologic comfort measures 04/22/2019 0341 by Yvone Neu, RN Outcome: Progressing 04/21/2019 2217 by Yvone Neu, RN Outcome: Progressing   Problem: Health Behavior/Discharge Planning: Goal: Ability to manage health-related needs will improve 04/22/2019 0341 by Yvone Neu, RN Outcome: Progressing 04/21/2019 2217 by Yvone Neu, RN Outcome: Progressing   Problem: Clinical Measurements: Goal: Ability to maintain clinical measurements within normal limits will improve 04/22/2019 0341 by Yvone Neu, RN Outcome: Progressing 04/21/2019 2217 by Yvone Neu, RN Outcome: Progressing Goal: Will remain free from infection 04/22/2019 0341 by Yvone Neu, RN Outcome: Progressing 04/21/2019 2217 by Yvone Neu, RN Outcome: Progressing Goal: Diagnostic test results will improve 04/22/2019 0341 by Yvone Neu, RN Outcome: Progressing 04/21/2019 2217 by Yvone Neu, RN Outcome: Progressing Goal: Respiratory complications will improve 04/22/2019 0341 by Yvone Neu, RN Outcome: Progressing 04/21/2019 2217 by Yvone Neu, RN Outcome: Progressing Goal: Cardiovascular complication will be avoided 04/22/2019 0341 by Yvone Neu, RN Outcome: Progressing 04/21/2019 2217 by Yvone Neu, RN Outcome: Progressing   Problem: Activity: Goal: Risk for activity intolerance will decrease 04/22/2019 0341 by Yvone Neu, RN Outcome: Progressing 04/21/2019 2217 by Yvone Neu, RN Outcome: Progressing   Problem: Elimination: Goal: Will not experience complications related to bowel motility 04/22/2019 0341 by Yvone Neu, RN Outcome: Progressing 04/21/2019 2217 by Yvone Neu, RN Outcome: Progressing Goal: Will not experience complications related to urinary  retention 04/22/2019 0341 by Yvone Neu, RN Outcome: Progressing 04/21/2019 2217 by Yvone Neu, RN Outcome: Progressing   Problem: Safety: Goal: Ability to remain free from injury will improve 04/22/2019 0341 by Yvone Neu, RN Outcome: Progressing 04/21/2019 2217 by Yvone Neu, RN Outcome: Progressing   Problem: Skin Integrity: Goal: Risk for impaired skin integrity will decrease 04/22/2019 0341 by Yvone Neu, RN Outcome: Progressing 04/21/2019 2217 by Yvone Neu, RN Outcome: Progressing   Problem: Education: Goal: Knowledge of disease or condition will improve 04/22/2019 0341 by Yvone Neu, RN Outcome: Progressing 04/21/2019 2217 by Yvone Neu, RN Outcome: Progressing Goal: Knowledge of secondary prevention will improve 04/22/2019 0341 by Yvone Neu, RN Outcome: Progressing 04/21/2019 2217 by Yvone Neu, RN Outcome: Progressing Goal: Knowledge of patient specific risk factors addressed and post discharge goals established will improve 04/22/2019 0341 by Yvone Neu, RN Outcome: Progressing 04/21/2019 2217 by Yvone Neu, RN Outcome: Progressing Goal: Individualized Educational Video(s) 04/22/2019 0341 by Yvone Neu, RN Outcome: Progressing 04/21/2019 2217 by Yvone Neu, RN Outcome: Progressing   Problem: Coping: Goal: Will verbalize positive feelings about self 04/22/2019 0341 by Yvone Neu, RN Outcome: Progressing 04/21/2019 2217 by Yvone Neu, RN Outcome: Progressing Goal: Will identify appropriate support needs 04/22/2019 0341 by Yvone Neu, RN Outcome: Progressing 04/21/2019 2217 by Yvone Neu, RN Outcome: Progressing   Problem: Health Behavior/Discharge Planning: Goal: Ability to manage health-related needs will improve 04/22/2019 0341 by Yvone Neu, RN Outcome: Progressing 04/21/2019 2217 by Yvone Neu, RN Outcome: Progressing   Problem:  Self-Care: Goal: Ability to participate in self-care as condition permits will improve 04/22/2019 0341 by Yvone Neu, RN Outcome: Progressing 04/21/2019 2217 by Yvone Neu, RN Outcome: Progressing Goal: Verbalization of feelings and concerns over difficulty with self-care will improve 04/22/2019 0341 by Yvone Neu, RN Outcome: Progressing  04/21/2019 2217 by Gwyndolyn Kaufman, RN Outcome: Progressing Goal: Ability to communicate needs accurately will improve 04/22/2019 0341 by Gwyndolyn Kaufman, RN Outcome: Progressing 04/21/2019 2217 by Gwyndolyn Kaufman, RN Outcome: Progressing   Problem: Nutrition: Goal: Risk of aspiration will decrease 04/22/2019 0341 by Gwyndolyn Kaufman, RN Outcome: Progressing 04/21/2019 2217 by Gwyndolyn Kaufman, RN Outcome: Progressing Goal: Dietary intake will improve 04/22/2019 0341 by Gwyndolyn Kaufman, RN Outcome: Progressing 04/21/2019 2217 by Gwyndolyn Kaufman, RN Outcome: Progressing   Problem: Ischemic Stroke/TIA Tissue Perfusion: Goal: Complications of ischemic stroke/TIA will be minimized 04/22/2019 0341 by Gwyndolyn Kaufman, RN Outcome: Progressing 04/21/2019 2217 by Gwyndolyn Kaufman, RN Outcome: Progressing

## 2019-04-22 NOTE — TOC Progression Note (Signed)
Transition of Care [facility]) - Progression Note    Patient Details  Name: Philip Vasquez MRN: [id number] Date of Birth: 11-May-1960  [facility]) CM/SW Contact  Kermit Balo, RN Phone Number: 04/22/2019, 2:49 PM  Clinical Narrative:    Disability still pending. No current bed offers. TOC following.   Expected Discharge Plan: Skilled Nursing Facility Barriers to Discharge: SNF Pending payor source - LOG, SNF Pending bed offer, Inadequate or no insurance, Active Substance Use - Placement  Expected Discharge Plan and Services Expected Discharge Plan: Skilled Nursing Facility In-house Referral: Clinical Social Work Discharge Planning Services: CM Consult                                           Social Determinants of Health (SDOH) Interventions    Readmission Risk Interventions No flowsheet data found.

## 2019-04-22 NOTE — Progress Notes (Signed)
   Subjective:   Pt seen at the bedside. Doing well this morning and taking his medications. No complaints at this time.   Objective:  Vital signs in last 24 hours: Vitals:   04/21/19 1621 04/21/19 1947 04/21/19 2300 04/22/19 0319  BP: 129/79 126/83 93/66 103/74  Pulse: 77 79 73 70  Resp: 18 16 16 18   Temp: 99 F (37.2 C) 98.8 F (37.1 C) 98 F (36.7 C) 98 F (36.7 C)  TempSrc: Oral Oral Oral Oral  SpO2: 97% 97% 99% 93%  Weight:      Height:       Physical Exam General: Comfortably resting and laying in bed, watching TV HEENT: NCAT CV: RRR, normal S1-S2 no murmurs rubs or gallops appreciated PULM: Clear to auscultation bilaterally, no wheezes or crackles ABD: Soft and nontender in all quadrants NEURO: Alert and oriented, L sided 4/5 stregnth in upper and lower extremity.  5/5 in right upper and lower extremity  Assessment/Plan:  Principal Problem:   Cerebral thrombosis with cerebral infarction Active Problems:   Right sided weakness   Anemia of chronic disease   Urinary tract infection  In summary, Philip Vasquez is a 59 year old male with a past medical history of substance use who was admitted for CVA. He is currently medically stable for discharge, however due to his uninsured status awaiting SNF placement.  #L ACA/MCA watershed infarct  -Aspirin 81 mg daily  -Rosuvastatin 40 mg daily   #HTN -Losartan 50 mg daily -Amlodipine 10 mg daily -Metoprolol 25 mg daily -Hydralazine 75 mg daily   #FEN/GI -Diet: Regular -Fluids: None -MiraLAX daily -Senokot 2 tablets daily  #DVT prophylaxis -Lovenox 40 mg subq injections daily  #CODE STATUS: FULL  #Dispo: Medically stable for d/c. Anticipated discharge pending SNF placement.  41, MD Internal Medicine, PGY1 Pager: [phone]  04/22/2019,10:00 AM

## 2019-04-23 LAB — BASIC METABOLIC PANEL
Anion gap: 7 (ref 5–15)
BUN: 26 mg/dL — ABNORMAL HIGH (ref 6–20)
CO2: 26 mmol/L (ref 22–32)
Calcium: 9.1 mg/dL (ref 8.9–10.3)
Chloride: 106 mmol/L (ref 98–111)
Creatinine, Ser: 1.52 mg/dL — ABNORMAL HIGH (ref 0.61–1.24)
GFR calc Af Amer: 58 mL/min — ABNORMAL LOW (ref >60)
GFR calc non Af Amer: 50 mL/min — ABNORMAL LOW (ref >60)
Glucose, Bld: 101 mg/dL — ABNORMAL HIGH (ref 70–99)
Potassium: 3.8 mmol/L (ref 3.5–5.1)
Sodium: 139 mmol/L (ref 135–145)

## 2019-04-23 LAB — CBC
HCT: 35.5 % — ABNORMAL LOW (ref 39.0–52.0)
Hemoglobin: 11.1 g/dL — ABNORMAL LOW (ref 13.0–17.0)
MCH: 26.6 pg (ref 26.0–34.0)
MCHC: 31.3 g/dL (ref 30.0–36.0)
MCV: 84.9 fL (ref 80.0–100.0)
Platelets: 208 10*3/uL (ref 150–400)
RBC: 4.18 MIL/uL — ABNORMAL LOW (ref 4.22–5.81)
RDW: 14.4 % (ref 11.5–15.5)
WBC: 4.9 10*3/uL (ref 4.0–10.5)
nRBC: 0 % (ref 0.0–0.2)

## 2019-04-23 NOTE — Progress Notes (Addendum)
   Subjective:   Pt seen at the bedside. Pt was asleep upon entering the room and I startled him. States he is doing okay and has no complaints.    Objective: CBC Latest Ref Rng & Units 04/23/2019 04/16/2019 04/09/2019  WBC 4.0 - 10.5 K/uL 4.9 5.5 7.0  Hemoglobin 13.0 - 17.0 g/dL 11.1(L) 12.2(L) 11.1(L)  Hematocrit 39.0 - 52.0 % 35.5(L) 38.4(L) 35.3(L)  Platelets 150 - 400 K/uL 208 280 357   BMP Latest Ref Rng & Units 04/23/2019 04/16/2019 04/09/2019  Glucose 70 - 99 mg/dL 793(J) 030(S) 923(R)  BUN 6 - 20 mg/dL 00(T) 62(U) 63(F)  Creatinine 0.61 - 1.24 mg/dL 3.54(T) 6.25(W) 3.89(H)  Sodium 135 - 145 mmol/L 139 140 140  Potassium 3.5 - 5.1 mmol/L 3.8 4.4 4.0  Chloride 98 - 111 mmol/L 106 102 108  CO2 22 - 32 mmol/L 26 27 23   Calcium 8.9 - 10.3 mg/dL 9.1 9.8 9.2   Vital signs in last 24 hours: Vitals:   04/22/19 1953 04/22/19 2327 04/23/19 0300 04/23/19 0823  BP: 123/90 131/80 121/77 (!) 145/83  Pulse: 75 69 71 64  Resp: 16 18 16 17   Temp: 97.6 F (36.4 C) 98 F (36.7 C) 98.1 F (36.7 C) 97.7 F (36.5 C)  TempSrc: Oral Oral Oral Oral  SpO2: 100% 100% 100% 100%  Weight:      Height:       Physical Exam General: Comfortably resting and laying in bed, watching TV HEENT: NCAT CV: RRR, normal S1-S2 no murmurs rubs or gallops appreciated PULM: Clear to auscultation bilaterally, no wheezes or crackles ABD: Soft and nontender in all quadrants NEURO: Alert and oriented, L sided 4/5 stregnth in upper and lower extremity.  5/5 in right upper and lower extremity  Assessment/Plan:  Principal Problem:   Cerebral thrombosis with cerebral infarction Active Problems:   Right sided weakness   Anemia of chronic disease   Urinary tract infection  In summary, Philip Vasquez is a 59 year old male with a past medical history of substance use who was admitted for CVA. He is currently medically stable for discharge, however due to his uninsured status awaiting SNF placement.  #L ACA/MCA watershed  infarct  -Aspirin 81 mg daily  -Rosuvastatin 40 mg daily   #HTN -Losartan 50 mg daily -Amlodipine 10 mg daily -Metoprolol 25 mg daily -Hydralazine 75 mg daily   #FEN/GI -Diet: Regular -Fluids: None -MiraLAX daily -Senokot 2 tablets daily  #DVT prophylaxis -Lovenox 40 mg subq injections daily  #CODE STATUS: FULL  #Dispo: Medically stable for d/c. Anticipated discharge pending SNF placement.  Durene Cal, MD Internal Medicine, PGY1 Pager: [phone]  04/23/2019,11:59 AM

## 2019-04-23 NOTE — Progress Notes (Signed)
OT Cancellation Note  Patient Details Name: Philip Vasquez MRN: [id number] DOB: 01/22/1961   Cancelled Treatment:    Reason Eval/Treat Not Completed: Patient declined, no reason specified. Pt states " im good! I dont need therapy". Pt agreeing to get up and show progress so therapy can sign off but when attempting to set room up ( move call bell/ move sheets) pt pulling at items and states "NO! Im good"  Pt frequency 1 x per week at this time   Timmothy Euler, OTR/L  Acute Rehabilitation Services Pager: [phone] Office: [phone] .   Mateo Flow 04/23/2019, 12:17 PM

## 2019-04-24 NOTE — Progress Notes (Signed)
   Subjective: HD#126   Overnight: No acute events reported  Today, Philip Vasquez denies any new complaints.  Comfortably watching TV  Objective:  Vital signs in last 24 hours: Vitals:   04/23/19 1603 04/23/19 1944 04/24/19 0003 04/24/19 0329  BP: 123/75 110/70 105/72 117/78  Pulse: 72 71 63 61  Resp: 16 18 18 16   Temp: 97.8 F (36.6 C) 98.2 F (36.8 C) 98.3 F (36.8 C) 98.2 F (36.8 C)  TempSrc: Oral Oral Oral Oral  SpO2: 98% 98% 96% 98%  Weight:      Height:       Const: In no apparent distress, lying comfortably in bed, conversational CV: RRR, no murmurs, gallop, rub  Assessment/Plan:  Principal Problem:   Cerebral thrombosis with cerebral infarction Active Problems:   Right sided weakness   Anemia of chronic disease   Urinary tract infection  In summary, Philip Vasquez is a 59 year old male with a past medical history of substance use who was admitted for CVA. He is currently medically stable for discharge, however due to his uninsured status awaiting SNF placement.  #L ACA/MCA watershed infarct  -Aspirin 81 mg daily  -Rosuvastatin 40 mg daily   #HTN -Losartan 50 mg daily -Amlodipine 10 mg daily -Metoprolol 25 mg daily -Hydralazine 75 mg daily   #FEN/GI -Diet: Regular -Fluids: None -MiraLAX daily -Senokot 2 tablets daily  #DVT prophylaxis -Lovenox 40 mg subq injections daily  #CODE STATUS: FULL  #Dispo: Medically stable for d/c. Anticipated discharge pending SNF placement.  41, MD 04/24/2019, 6:35 AM Pager: [phone] Internal Medicine Teaching Service

## 2019-04-25 NOTE — Progress Notes (Signed)
   Subjective: HD#127   Overnight: No acute events  Today, Philip Vasquez was lying in bed watching TV.  Objective:  Vital signs in last 24 hours: Vitals:   04/24/19 1300 04/24/19 2015 04/24/19 2338 04/25/19 0326  BP: 127/78 123/72 115/83 130/87  Pulse: 70 80 63 60  Resp:  18 19 19   Temp: 98.7 F (37.1 C) 99.5 F (37.5 C) 98 F (36.7 C) 97.9 F (36.6 C)  TempSrc: Oral Oral Oral Oral  SpO2: 98% 96% 96% 97%  Weight:      Height:       Const: In bed watching TV CV: RRR, no murmurs, gallop, rub   Assessment/Plan:  Principal Problem:   Cerebral thrombosis with cerebral infarction Active Problems:   Right sided weakness   Anemia of chronic disease   Urinary tract infection  In summary, [name] is a 59 year old male with a past medical history of substance use who was admitted for CVA. He is currently medically stable for discharge, however due to his uninsured status awaiting SNF placement.  #L ACA/MCA watershed infarct  -Aspirin 81 mg daily  -Rosuvastatin 40 mg daily   #HTN -Losartan 50 mg daily -Amlodipine 10 mg daily -Metoprolol 25 mg daily -Hydralazine 75 mg daily   #FEN/GI -Diet: Regular -Fluids: None -MiraLAX daily -Senokot 2 tablets daily  #DVT prophylaxis -Lovenox 40 mg subq injections daily  #CODE STATUS: FULL  #Dispo: Medically stable for d/c. Anticipated discharge pending SNF placement.  41, MD 04/25/2019, 6:26 AM Pager: [phone] Internal Medicine Teaching Service

## 2019-04-26 NOTE — Progress Notes (Signed)
Physical Therapy Treatment Patient Details Name: Philip Vasquez MRN: [id number] DOB: [birth year] Today's Date: 04/26/2019    History of Present Illness Philip Vasquez is a 59 year old gentleman with no past medical history who presented with AMS and right-sided weakness. MRI: Widely scattered small acute infarcts in the medial lefthemisphere involving left ACA and left ACA/MCA watershed area.    PT Comments    Pt performed sit to stand progression away from using the stedy to standing with the RW.  He performed weight shifting in standing but unable to take steps back to bed.  He sits with poor eccentric load.  He required mod-max assistance.  He continues to benefit from snf placement.    Follow Up Recommendations  SNF;Supervision/Assistance - 24 hour     Equipment Recommendations  Wheelchair (measurements PT);Wheelchair cushion (measurements PT);Hospital bed;Other (comment)    Recommendations for Other Services Rehab consult     Precautions / Restrictions Precautions Precautions: Fall Precaution Comments: R UE/LE weakness/tone Restrictions Weight Bearing Restrictions: No    Mobility  Bed Mobility Overal bed mobility: Needs Assistance Bed Mobility: Rolling Rolling: Mod assist Sidelying to sit: Mod assist       General bed mobility comments: assistance to advance LEs to edge of bed and elevate trunk into a seated position.  Use of chux pad to scoot hips to edge of bed to prepare to stand.  Transfers Overall transfer level: Needs assistance Equipment used: Rolling walker (2 wheeled) Transfers: Sit to/from Stand Sit to Stand: Mod assist;From elevated surface         General transfer comment: Pt required cues for hand placement, bed placed in elevated position and assistance to boost into standing.  Cues for forward weight shifting and pushing through B LEs.  Pt with posteriolateral lean to the R.  Ambulation/Gait Ambulation/Gait assistance: (NT unable to progress gt but  performed weight shifting in standing.)               Stairs             Wheelchair Mobility    Modified Rankin (Stroke Patients Only) Modified Rankin (Stroke Patients Only) Pre-Morbid Rankin Score: Moderate disability Modified Rankin: Severe disability     Balance Overall balance assessment: Needs assistance   Sitting balance-Leahy Scale: Poor       Standing balance-Leahy Scale: Poor                              Cognition Arousal/Alertness: Awake/alert Behavior During Therapy: Flat affect Overall Cognitive Status: Impaired/Different from baseline Area of Impairment: Following commands;Safety/judgement;Problem solving                       Following Commands: Follows one step commands with increased time Safety/Judgement: Decreased awareness of safety;Decreased awareness of deficits   Problem Solving: Difficulty sequencing;Requires verbal cues;Requires tactile cues;Slow processing General Comments: Pt very recpetive to tx when given exact detail on tasks to perform and how long tx will take.      Exercises      General Comments        Pertinent Vitals/Pain Pain Assessment: Faces Faces Pain Scale: Hurts even more Pain Location: R knee with movement Pain Descriptors / Indicators: Guarding;Grimacing Pain Intervention(s): Monitored during session;Repositioned    Home Living                      Prior Function  PT Goals (current goals can now be found in the care plan section) Acute Rehab PT Goals Patient Stated Goal: pt did not state Potential to Achieve Goals: Fair Progress towards PT goals: Progressing toward goals    Frequency    Min 1X/week      PT Plan Current plan remains appropriate    Co-evaluation              AM-PAC PT "6 Clicks" Mobility   Outcome Measure  Help needed turning from your back to your side while in a flat bed without using bedrails?: A Lot Help needed moving  from lying on your back to sitting on the side of a flat bed without using bedrails?: A Lot Help needed moving to and from a bed to a chair (including a wheelchair)?: A Lot Help needed standing up from a chair using your arms (e.g., wheelchair or bedside chair)?: A Lot Help needed to walk in hospital room?: Total Help needed climbing 3-5 steps with a railing? : Total 6 Click Score: 10    End of Session Equipment Utilized During Treatment: Gait belt Activity Tolerance: Patient tolerated treatment well Patient left: in bed;with bed alarm set;with call bell/phone within reach(placed in chair position.) Nurse Communication: Mobility status PT Visit Diagnosis: Other abnormalities of gait and mobility (R26.89);Hemiplegia and hemiparesis;Muscle weakness (generalized) (M62.81) Hemiplegia - Right/Left: Right Hemiplegia - dominant/non-dominant: Dominant Hemiplegia - caused by: Cerebral infarction     Time: 1206-1225 PT Time Calculation (min) (ACUTE ONLY): 19 min  Charges:  $Therapeutic Activity: 8-22 mins                     Bonney Leitz , PTA Acute Rehabilitation Services Pager [phone] Office [phone]     Jahel Wavra Artis Delay 04/26/2019, 12:42 PM

## 2019-04-26 NOTE — Progress Notes (Addendum)
   Subjective:   Patient seen at the bedside this morning.Patient seen at the bedside this morning.  Sleeping comfortably in bed.  Wakes up to the call of his name and is alert and oriented.  No complaints at this time.  Objective: CBC Latest Ref Rng & Units 04/23/2019 04/16/2019 04/09/2019  WBC 4.0 - 10.5 K/uL 4.9 5.5 7.0  Hemoglobin 13.0 - 17.0 g/dL 11.1(L) 12.2(L) 11.1(L)  Hematocrit 39.0 - 52.0 % 35.5(L) 38.4(L) 35.3(L)  Platelets 150 - 400 K/uL 208 280 357   BMP Latest Ref Rng & Units 04/23/2019 04/16/2019 04/09/2019  Glucose 70 - 99 mg/dL 937(T) 024(O) 973(Z)  BUN 6 - 20 mg/dL 32(D) 92(E) 26(S)  Creatinine 0.61 - 1.24 mg/dL 3.41(D) 6.22(W) 9.79(G)  Sodium 135 - 145 mmol/L 139 140 140  Potassium 3.5 - 5.1 mmol/L 3.8 4.4 4.0  Chloride 98 - 111 mmol/L 106 102 108  CO2 22 - 32 mmol/L 26 27 23   Calcium 8.9 - 10.3 mg/dL 9.1 9.8 9.2   Vital signs in last 24 hours: Vitals:   04/25/19 1512 04/25/19 1956 04/26/19 0011 04/26/19 0353  BP: 126/86 129/82 101/80 126/90  Pulse:  73 67 63  Resp: 18 18 17 18   Temp: 98.3 F (36.8 C) 98 F (36.7 C) 97.9 F (36.6 C) 97.8 F (36.6 C)  TempSrc: Oral Oral Oral Oral  SpO2: 97% 94% 94% 95%  Weight:      Height:       Physical Exam General: Resting in bed comfortably, NAD HEENT: NCAT CV: RRR, normal S1-S2 no murmurs rubs or gallops appreciated PULM: Clear to auscultation bilaterally, no crackles or wheezes appreciated ABD: Soft and nontender in all 4 quadrants Neuro: Alert and oriented, 4/5 strength in right upper and lower extremity.  5/5 strength in left upper and lower extremity  Assessment/Plan:  Principal Problem:   Cerebral thrombosis with cerebral infarction Active Problems:   Right sided weakness   Anemia of chronic disease   Urinary tract infection  In summary, Philip Vasquez is a 59 year old male with a past medical history of substance use who was admitted for L ACA/MCA watershed infarct resulting in right-sided upper and lower  extremity deficits. He is currently medically stable for discharge, however due to his uninsured status awaiting SNF placement.  #L ACA/MCA watershed infarct  -Aspirin 81 mg daily  -Rosuvastatin 40 mg daily   #HTN -Losartan 50 mg daily -Amlodipine 10 mg daily -Metoprolol 25 mg daily -Hydralazine 75 mg daily   #FEN/GI -Diet: Regular -Fluids: None -MiraLAX daily -Senokot 2 tablets daily  #DVT prophylaxis -Lovenox 40 mg subq injections daily  #CODE STATUS: FULL  #Dispo: Medically stable for d/c. Anticipated discharge pending SNF placement.  Durene Cal, MD Internal Medicine, PGY1 Pager: [phone]  04/26/2019,7:52 AM

## 2019-04-27 NOTE — Progress Notes (Signed)
   Subjective: HD#129   Overnight: No acute events overnight reported  Today, Philip Vasquez was examined at bedside comfortably lying in his bed watching TV.  Objective:  Vital signs in last 24 hours: Vitals:   04/26/19 2009 04/26/19 2334 04/27/19 0404 04/27/19 0818  BP: 111/70 92/70 107/76 120/85  Pulse: 83 68 70 75  Resp: 17 17 18 18   Temp:  98.8 F (37.1 C) 97.7 F (36.5 C) 98.9 F (37.2 C)  TempSrc:  Oral Oral Oral  SpO2: 98% 95% 96% 98%  Weight:      Height:       Const: In no apparent distress, lying comfortably in bed, conversational Ext: No lower extremity edema, skin is warm to touch   Assessment/Plan:  Principal Problem:   Cerebral thrombosis with cerebral infarction Active Problems:   Right sided weakness   Anemia of chronic disease   Urinary tract infection  In summary, Philip Vasquez is a 59 year old male with a past medical history of substance use who was admitted for L ACA/MCA watershed infarct resulting in right-sided upper and lower extremity deficits. He is currently medically stable for discharge, however due to his uninsured status awaiting SNF placement.  #L ACA/MCA watershed infarct  -Aspirin 81 mg daily  -Rosuvastatin 40 mg daily   #HTN -Losartan 50 mg daily -Amlodipine 10 mg daily -Metoprolol 25 mg daily -Hydralazine 75 mg daily   #FEN/GI -Diet: Regular -Fluids: None -MiraLAX daily -Senokot 2 tablets daily  #DVT prophylaxis -Lovenox 40 mg subq injections daily  #CODE STATUS: FULL  #Dispo: Medically stable for d/c. Anticipated discharge pending SNF placement. 1.  Where patient is from: Home 2.  Anticipate discharge to: SNF 3.  Barriers to discharge: Pending bed availability   41, MD 04/27/2019, 10:59 AM Pager: [phone] Internal Medicine Teaching Service

## 2019-04-28 NOTE — Progress Notes (Signed)
   Subjective:   Patient seen at the bedside and resting comfortably.  No complaints at this time  Objective:  Vital signs in last 24 hours: Vitals:   04/28/19 0022 04/28/19 0351 04/28/19 0818 04/28/19 1122  BP: 107/72 113/73 (!) 135/97 119/78  Pulse: 70 66 68 75  Resp: 18 18 18 18   Temp: 98.1 F (36.7 C) 98.4 F (36.9 C) 98.3 F (36.8 C) 97.6 F (36.4 C)  TempSrc: Oral Oral Oral Oral  SpO2: 93% 98% 99% 98%  Weight:      Height:       Physical Exam General: Resting in bed comfortably watching TV HEENT: NCAT CV: RRR, Normal S1-S2 No Murmurs Rubs or Gallops Appreciated PULM: Clear to auscultation bilaterally ABD: Soft and nontender in all quadrants  Assessment/Plan:  Principal Problem:   Cerebral thrombosis with cerebral infarction Active Problems:   Right sided weakness   Anemia of chronic disease   Urinary tract infection  In summary, Mr. Lich is a 59 year old male with a past medical history of substance use who was admitted for L ACA/MCA watershed infarct resulting in right-sided upper and lower extremity deficits. He is currently medically stable for discharge, however due to his uninsured status awaiting SNF placement.  #L ACA/MCA watershed infarct  -Aspirin 81 mg daily  -Rosuvastatin 40 mg daily   #HTN -Losartan 50 mg daily -Amlodipine 10 mg daily -Metoprolol 25 mg daily -Hydralazine 75 mg daily   #FEN/GI -Diet: Regular -Fluids: None -MiraLAX daily -Senokot 2 tablets daily  #DVT prophylaxis -Lovenox 40 mg subq injections daily  #CODE STATUS: FULL  #Dispo: Medically stable for d/c 1.  Where patient is from: Home 2.  Anticipate discharge to: SNF 3.  Barriers to discharge: Pending bed availability  41, MD Internal Medicine, PGY1 Pager: [phone]  04/28/2019,11:48 AM

## 2019-04-28 NOTE — Progress Notes (Signed)
OT Cancellation Note  Patient Details Name: Philip Vasquez MRN: [id number] DOB: 04-Dec-1960   Cancelled Treatment:    Reason Eval/Treat Not Completed: Patient declined, no reason specified Pt declined working with therapy stating "I don't feel like doing anything", when asked if he was okay being like this the rest of his life he replied "yes". When asked if he wanted to get stronger he replied "no". But he was unable to give a reason. He continued to state "I don't want to see you again". Therapist educated pt on declining OT services 3x and that he would be discharged. Pt verbalized agreement. Pt has declined 2x. Will follow-up with pt to continue to attempt OT sessions.   Diona Browner OTR/L Acute Rehabilitation Services Office: [phone]   Rebeca Alert 04/28/2019, 3:05 PM

## 2019-04-29 NOTE — Progress Notes (Signed)
   Subjective:   Patient seen at the bedside and resting comfortably. Pt states that he has no complaints.  Objective:  Vital signs in last 24 hours: Vitals:   04/28/19 1951 04/28/19 2359 04/29/19 0342 04/29/19 0813  BP: 116/75 120/78 130/79 138/90  Pulse: 87 73 64 71  Resp: 18 18 18 18   Temp: 98.3 F (36.8 C) 98.7 F (37.1 C) 98 F (36.7 C) 98.5 F (36.9 C)  TempSrc: Oral Oral Oral Oral  SpO2: 97% 97% 98% 95%  Weight:      Height:       Physical Exam General: Resting in bed comfortably watching TV HEENT: NCAT CV: RRR, Normal S1-S2 No Murmurs Rubs or Gallops Appreciated PULM: Clear to auscultation bilaterally,no crackles ABD: Soft and nontender in all quadrants  Assessment/Plan:  Principal Problem:   Cerebral thrombosis with cerebral infarction Active Problems:   Right sided weakness   Anemia of chronic disease   Urinary tract infection  In summary, Philip Vasquez is a 59 year old male with a past medical history of substance use who was admitted for L ACA/MCA watershed infarct resulting in right-sided upper and lower extremity deficits. He is currently medically stable for discharge, however due to his uninsured status awaiting SNF placement.  #L ACA/MCA watershed infarct  -Aspirin 81 mg daily  -Rosuvastatin 40 mg daily   #HTN -Losartan 50 mg daily -Amlodipine 10 mg daily -Metoprolol 25 mg daily -Hydralazine 75 mg daily   #FEN/GI -Diet: Regular -Fluids: None -MiraLAX daily -Senokot 2 tablets daily  #DVT prophylaxis -Lovenox 40 mg subq injections daily  #CODE STATUS: FULL  #Dispo: Medically stable for d/c 1.  Where patient is from: Home 2.  Anticipate discharge to: SNF 3.  Barriers to discharge: Pending bed availability  41, MD Internal Medicine, PGY1 Pager: [phone]  04/29/2019,11:22 AM

## 2019-04-30 NOTE — Progress Notes (Addendum)
   Subjective:   Patient seen at the bedside and resting comfortably. Watching TV currently.  No complaints at this time.  Objective: CBC Latest Ref Rng & Units 04/23/2019 04/16/2019 04/09/2019  WBC 4.0 - 10.5 K/uL 4.9 5.5 7.0  Hemoglobin 13.0 - 17.0 g/dL 11.1(L) 12.2(L) 11.1(L)  Hematocrit 39.0 - 52.0 % 35.5(L) 38.4(L) 35.3(L)  Platelets 150 - 400 K/uL 208 280 357   BMP Latest Ref Rng & Units 04/23/2019 04/16/2019 04/09/2019  Glucose 70 - 99 mg/dL 809(X) 833(A) 250(N)  BUN 6 - 20 mg/dL 39(J) 67(H) 41(P)  Creatinine 0.61 - 1.24 mg/dL 3.79(K) 2.40(X) 7.35(H)  Sodium 135 - 145 mmol/L 139 140 140  Potassium 3.5 - 5.1 mmol/L 3.8 4.4 4.0  Chloride 98 - 111 mmol/L 106 102 108  CO2 22 - 32 mmol/L 26 27 23   Calcium 8.9 - 10.3 mg/dL 9.1 9.8 9.2   Vital signs in last 24 hours: Vitals:   04/29/19 1524 04/29/19 1941 04/29/19 2353 04/30/19 0348  BP: 110/72 (!) 143/87 116/78 135/85  Pulse: 78 76 66 67  Resp: 18 18 17 19   Temp: 98.3 F (36.8 C) 98.2 F (36.8 C) 98.2 F (36.8 C) 97.6 F (36.4 C)  TempSrc: Oral Oral Oral Oral  SpO2: 94% 97% 96% 98%  Weight:      Height:       Physical Exam General: Resting in bed comfortably watching TV HEENT: NCAT  CV: RRR, Normal S1-S2 No Murmurs Rubs or Gallops Appreciated PULM: Normal WOB, clear to auscultation bilaterally,no crackles ABD: Soft and nontender in all quadrants  Assessment/Plan:  Principal Problem:   Cerebral thrombosis with cerebral infarction Active Problems:   Right sided weakness   Anemia of chronic disease   Urinary tract infection  In summary, Philip Vasquez is a 59 year old male with a past medical history of substance use who was admitted for L ACA/MCA watershed infarct resulting in right-sided upper and lower extremity deficits. He is currently medically stable for discharge, however due to his uninsured status awaiting SNF placement.  #L ACA/MCA watershed infarct  -Aspirin 81 mg daily  -Rosuvastatin 40 mg daily    #HTN -Losartan 50 mg daily -Amlodipine 10 mg daily -Metoprolol 25 mg daily -Hydralazine 75 mg daily   #FEN/GI -Diet: Regular -Fluids: None -MiraLAX daily -Senokot 2 tablets daily  #DVT prophylaxis -Lovenox 40 mg subq injections daily  #CODE STATUS: FULL  #Dispo: Medically stable for d/c 1.  Where patient is from: Home 2.  Anticipate discharge to: SNF 3.  Barriers to discharge: Pending bed availability  Philip Cal, MD Internal Medicine, PGY1 Pager: [phone]  04/30/2019,8:10 AM

## 2019-05-01 NOTE — Progress Notes (Signed)
   Subjective:   Patient seen at the bedside and resting comfortably. Watching TV currently. No complaints at this time.  Objective: CBC Latest Ref Rng & Units 04/23/2019 04/16/2019 04/09/2019  WBC 4.0 - 10.5 K/uL 4.9 5.5 7.0  Hemoglobin 13.0 - 17.0 g/dL 11.1(L) 12.2(L) 11.1(L)  Hematocrit 39.0 - 52.0 % 35.5(L) 38.4(L) 35.3(L)  Platelets 150 - 400 K/uL 208 280 357   BMP Latest Ref Rng & Units 04/23/2019 04/16/2019 04/09/2019  Glucose 70 - 99 mg/dL 606(T) 016(W) 109(N)  BUN 6 - 20 mg/dL 23(F) 57(D) 22(G)  Creatinine 0.61 - 1.24 mg/dL 2.54(Y) 7.06(C) 3.76(E)  Sodium 135 - 145 mmol/L 139 140 140  Potassium 3.5 - 5.1 mmol/L 3.8 4.4 4.0  Chloride 98 - 111 mmol/L 106 102 108  CO2 22 - 32 mmol/L 26 27 23   Calcium 8.9 - 10.3 mg/dL 9.1 9.8 9.2   Vital signs in last 24 hours: Vitals:   04/30/19 1600 04/30/19 2000 04/30/19 2353 05/01/19 0354  BP: 125/87 127/82 125/81 126/86  Pulse: 72 80 70 66  Resp: 18 16 17 16   Temp: 98.1 F (36.7 C) 97.6 F (36.4 C) 98.2 F (36.8 C) 97.9 F (36.6 C)  TempSrc: Oral Oral Oral Oral  SpO2: 98% 96% 93% 93%  Weight:      Height:       Physical Exam General: Resting in bed comfortably watching TV HEENT: NCAT  CV: RRR, Normal S1-S2 No murmurs rubs or gallops appreciated PULM: Normal WOB, clear to auscultation bilaterally,no crackles ABD: Soft and nontender in all quadrants  Assessment/Plan:  Principal Problem:   Cerebral thrombosis with cerebral infarction Active Problems:   Right sided weakness   Anemia of chronic disease   Urinary tract infection  In summary, Philip Vasquez is a 59 year old male with a past medical history of substance use who was admitted for L ACA/MCA watershed infarct resulting in right-sided upper and lower extremity deficits. He is currently medically stable for discharge, however due to his uninsured status awaiting SNF placement.  #L ACA/MCA watershed infarct  -Aspirin 81 mg daily  -Rosuvastatin 40 mg daily   #HTN -Losartan  50 mg daily -Amlodipine 10 mg daily -Metoprolol 25 mg daily -Hydralazine 75 mg daily   #FEN/GI -Diet: Regular -Fluids: None -MiraLAX daily -Senokot 2 tablets daily  #DVT prophylaxis -Lovenox 40 mg subq injections daily  #CODE STATUS: FULL  #Dispo: Medically stable for d/c 1.  Where patient is from: Home 2.  Anticipate discharge to: SNF 3.  Barriers to discharge: Pending bed availability  Durene Cal, MD Internal Medicine, PGY1 Pager: [phone]  05/01/2019,10:09 AM

## 2019-05-02 NOTE — Progress Notes (Signed)
   Subjective: HD#134   Overnight: None  Today, Philip Vasquez states he is doing well. He just finished breakfast  Objective:  Vital signs in last 24 hours: Vitals:   05/01/19 1650 05/01/19 2009 05/01/19 2356 05/02/19 0355  BP: 117/80 123/74 115/74 106/87  Pulse: 72 76 65 65  Resp: 20 18 18 18   Temp: 98.2 F (36.8 C) 97.8 F (36.6 C) 98.5 F (36.9 C) 98.5 F (36.9 C)  TempSrc: Oral Oral Oral Oral  SpO2: 97% 98% 95% 99%  Weight:      Height:       Const: In no apparent distress, lying comfortably in bed, conversational HEENT: Atraumatic, normocephalic CV: RRR, no murmurs, gallop, rub  Assessment/Plan:  Principal Problem:   Cerebral thrombosis with cerebral infarction Active Problems:   Right sided weakness   Anemia of chronic disease   Urinary tract infection  In summary, Philip Vasquez is a 59 year old male with a past medical history of substance use who was admitted forL ACA/MCA watershed infarctresulting in right-sided upper and lower extremity deficits.He is currently medically stable for discharge, however due to his uninsured status awaiting SNF placement.  #L ACA/MCA watershed infarct  -Aspirin 81 mg daily  -Rosuvastatin 40 mg daily   #HTN -Losartan 50 mg daily -Amlodipine 10 mg daily -Metoprolol 25 mg daily -Hydralazine 75 mg daily   #FEN/GI -Diet: Regular -Fluids: None -MiraLAX daily -Senokot 2 tablets daily  #DVT prophylaxis -Lovenox 40 mg subq injections daily  #CODE STATUS: FULL  Prior to Admission Living Arrangement: Home Anticipated Discharge Location: SNF Barriers to Discharge: Pending bed availability  Dispo: Anticipated discharge in approximately 3-4 day(s).    41, MD 05/02/2019, 6:48 AM Pager: [phone] Internal Medicine Teaching Service

## 2019-05-03 NOTE — Progress Notes (Signed)
   Subjective:   Pt seen at the bedside this AM.  No complaints at this time.  We did talk about the [facility] and [employer] championships yesterday.  Objective:  Vital signs in last 24 hours: Vitals:   05/02/19 1827 05/02/19 1949 05/03/19 0001 05/03/19 0431  BP: 121/74 117/76 118/80 122/83  Pulse: 80 78 68 65  Resp: 16 18 18 17   Temp: 98 F (36.7 C) 98.3 F (36.8 C) (!) 97.4 F (36.3 C) 98 F (36.7 C)  TempSrc: Oral Oral Oral Oral  SpO2: 98% 97% 94% 96%  Weight:      Height:       Physical Exam Const: In no apparent distress, lying comfortably in bed, conversational HEENT: Atraumatic, normocephalic CV: RRR, no murmurs, gallop, rub PULM: CTAB ABD: Soft and non tender  Assessment/Plan:  Principal Problem:   Cerebral thrombosis with cerebral infarction Active Problems:   Right sided weakness   Anemia of chronic disease   Urinary tract infection  In summary, Philip Vasquez is a 59 year old male with a past medical history of substance use who was admitted forL ACA/MCA watershed infarctresulting in right-sided upper and lower extremity deficits.He is currently medically stable for discharge, however due to his uninsured status awaiting SNF placement.  #L ACA/MCA watershed infarct  -Aspirin 81 mg daily  -Rosuvastatin 40 mg daily   #HTN -Losartan 50 mg daily -Amlodipine 10 mg daily -Metoprolol 25 mg daily -Hydralazine 75 mg daily   #FEN/GI -Diet: Regular -Fluids: None -MiraLAX daily -Senokot 2 tablets daily  #DVT prophylaxis -Lovenox 40 mg subq injections daily  #CODE STATUS: FULL  Dispo Prior to Admission Living Arrangement: Home Anticipated Discharge Location: SNF Barriers to Discharge: Pending bed availability   41, MD Internal Medicine, PGY1 Pager: [phone]  05/03/2019,9:49 AM

## 2019-05-03 NOTE — Progress Notes (Signed)
PT Cancellation Note  Patient Details Name: Philip Vasquez MRN: [id number] DOB: Feb 09, 1961   Cancelled Treatment:    Reason Eval/Treat Not Completed: Patient declined, no reason specified. Pt adamantly refusing participation in therapy, stating "I just don't want to do it." Offered additional attempts this afternoon but pt saying no. PT to re-attempt tomorrow.   Ilda Foil 05/03/2019, 11:22 AM  Aida Raider, PT  Office # [phone] Pager [phone]

## 2019-05-03 NOTE — Plan of Care (Signed)
[place] POC, pt waiting placement.

## 2019-05-03 NOTE — TOC Progression Note (Signed)
Transition of Care [facility]) - Progression Note    Patient Details  Name: Unknown Schleyer MRN: [id number] Date of Birth: [birth year]  [facility] CM/SW Contact  Doy Hutching, [place] Phone Number: 05/03/2019, 1:21 PM  Clinical Narrative:    Disability still pending. No current bed offers. Without disability no SNF able to accept.   Expected Discharge Plan: Skilled Nursing Facility Barriers to Discharge: SNF Pending payor source - LOG, SNF Pending bed offer, Inadequate or no insurance, Active Substance Use - Placement  Expected Discharge Plan and Services Expected Discharge Plan: Skilled Nursing Facility In-house Referral: Clinical Social Work Discharge Planning Services: CM Consult  Readmission Risk Interventions No flowsheet data found.

## 2019-05-04 MED ORDER — FLUOXETINE HCL 20 MG PO CAPS
80.0000 mg | ORAL_CAPSULE | Freq: Every day | ORAL | Status: DC
  Administered 2019-05-04 – 2019-06-14 (×42): 80 mg via ORAL
  Filled 2019-05-04 (×42): qty 4

## 2019-05-04 NOTE — Progress Notes (Addendum)
   Subjective:   Seen at the bedside today.  The patient has refused to work with PT/OT over the last several attempts. I asked the patient why this is, and he states that he feels sorry for himself and does not feel like working with any therapist.  He has a sense of hopelessness about his condition and how he has to stay in the hospital.  I reassured the patient that we are here for him and will try to do what we can to support him.  Patient states that he wants to stop talking about it.  Objective:  Vital signs in last 24 hours: Vitals:   05/03/19 2020 05/04/19 0009 05/04/19 0457 05/04/19 0802  BP: 118/77 106/70 105/78 126/78  Pulse: 75 68 64 67  Resp: 18 18 16 17   Temp: 98.4 F (36.9 C) 98 F (36.7 C) 98.1 F (36.7 C) 98.3 F (36.8 C)  TempSrc: Oral Oral Oral Oral  SpO2: 97% 94% 95% 99%  Weight:      Height:       Physical Exam Const: In no apparent distress, lying comfortably in bed, conversational HEENT: Atraumatic, normocephalic CV: RRR, no murmurs, gallop, rub PULM: CTAB ABD: Soft and non tender Psych: Depressed affect  Assessment/Plan:  Principal Problem:   Cerebral thrombosis with cerebral infarction Active Problems:   Right sided weakness   Anemia of chronic disease   Urinary tract infection  In summary, Mr. Lineman is a 59 year old male with a past medical history of substance use who was admitted forL ACA/MCA watershed infarctresulting in right-sided upper and lower extremity deficits.He is currently medically stable for discharge, however due to his uninsured status awaiting SNF placement.  #L ACA/MCA watershed infarct  -Aspirin 81 mg daily  -Rosuvastatin 40 mg daily  -Continue PT/OT  #Depressed mood -Increase fluoxetine to 80 mg daily and 60 mg -Consider adding on Wellbutrin  #HTN -Losartan 50 mg daily -Amlodipine 10 mg daily -Metoprolol 25 mg daily -Hydralazine 75 mg daily   #FEN/GI -Diet: Regular -Fluids: None -MiraLAX daily -Senokot  2 tablets daily  #DVT prophylaxis -Lovenox 40 mg subq injections daily  #CODE STATUS: FULL  Dispo Prior to Admission Living Arrangement: Home Anticipated Discharge Location: SNF  Barriers to Discharge: Pending bed availability   41, MD Internal Medicine, PGY1 Pager: [phone]  05/04/2019,10:40 AM

## 2019-05-04 NOTE — Progress Notes (Signed)
PT Cancellation Note  Patient Details Name: Philip Vasquez MRN: [id number] DOB: 26-Jul-1960   Cancelled Treatment:    Reason Eval/Treat Not Completed: Patient declined, no reason specified  Pt shaking head "no" as soon as therapy walked in.  Tried to encourage to participate even 5-10 minutes just to sit on EOB or take side steps and lie back down.  He continued to refuse.  Educated on benefits of maintaining strength and getting OOB but stated "I understand, and I don't want to."  Will f/u as able.  Royetta Asal, PT Acute Rehab Services Pager [phone] [facility] [phone] [facility] [phone]  Rayetta Humphrey 05/04/2019, 3:22 PM

## 2019-05-05 MED ORDER — HYDRALAZINE HCL 25 MG PO TABS
25.0000 mg | ORAL_TABLET | Freq: Three times a day (TID) | ORAL | Status: DC
  Administered 2019-05-05 – 2019-06-14 (×121): 25 mg via ORAL
  Filled 2019-05-05 (×121): qty 1

## 2019-05-05 NOTE — Progress Notes (Signed)
   Subjective:   Seen at the bedside today.  Patient has no complaints at this time.  Objective:  Vital signs in last 24 hours: Vitals:   05/04/19 1640 05/04/19 1900 05/05/19 0001 05/05/19 0358  BP: 111/79 115/78 112/76 106/77  Pulse: 78 82 79 65  Resp: 18 18 18 18   Temp: 99.1 F (37.3 C) 98.8 F (37.1 C) 98.7 F (37.1 C) 97.9 F (36.6 C)  TempSrc: Oral Oral Oral Oral  SpO2: 98% 99% 99% 98%  Weight:      Height:       Physical Exam Const: In no apparent distress, lying comfortably in bed, conversational HEENT: Atraumatic, normocephalic CV: RRR, no murmurs, gallop, rub PULM: CTAB ABD: Soft and non tender Psych: Depressed affect  Assessment/Plan:  Principal Problem:   Cerebral thrombosis with cerebral infarction Active Problems:   Right sided weakness   Anemia of chronic disease   Urinary tract infection  In summary, Philip Vasquez is a 59 year old male with a past medical history of substance use who was admitted forL ACA/MCA watershed infarctresulting in right-sided upper and lower extremity deficits.He is currently medically stable for discharge, however due to his uninsured status awaiting SNF placement.  #L ACA/MCA watershed infarct  -Aspirin 81 mg daily  -Rosuvastatin 40 mg daily  -Continue PT/OT  #Depressed mood -Fluoxetine 80 mg daily -Consider adding on Wellbutrin in the future for mood  #HTN -Losartan 50 mg daily -Amlodipine 10 mg daily -Metoprolol 25 mg daily -Hydralazine 75 mg daily   #FEN/GI -Diet: Regular -Fluids: None -MiraLAX daily -Senokot 2 tablets daily  #DVT prophylaxis -Lovenox 40 mg subq injections daily  #CODE STATUS: FULL  Dispo Prior to Admission Living Arrangement: Home Anticipated Discharge Location: SNF  Barriers to Discharge: Pending bed availability   41, MD Internal Medicine, PGY1 Pager: [phone]  05/05/2019,2:03 PM

## 2019-05-05 NOTE — Progress Notes (Signed)
Occupational Therapy Treatment Patient Details Name: Philip Vasquez MRN: [id number] DOB: September 08, 1960 Today's Date: 05/05/2019    History of present illness Philip Vasquez is a 59 year old gentleman with no past medical history who presented with AMS and right-sided weakness. MRI: Widely scattered small acute infarcts in the medial lefthemisphere involving left ACA and left ACA/MCA watershed area.   OT comments  Pt seen for OT f./u in coordination with PT with max encouragement from RN staff as well. Pt initially declining, but participated with initiation. Pt completed bed mobility with mod A +2. Once seated EOB he was able to stand with max A +2 and use of RW. Cues needed to correct posture and posterior lean. R tone still noted. Pt deferring further mobility and engagement in session. Once back in bed set pt up with meal tray, requiring assist for packaging and arrangement of tray. He was thankful after this. D/c recs remain appropriate. Will continue to follow.    Follow Up Recommendations  SNF;Supervision/Assistance - 24 hour    Equipment Recommendations  3 in 1 bedside commode    Recommendations for Other Services      Precautions / Restrictions Precautions Precautions: Fall Precaution Comments: R UE/LE weakness/tone Restrictions Weight Bearing Restrictions: No       Mobility Bed Mobility Overal bed mobility: Needs Assistance Bed Mobility: Sit to Supine     Supine to sit: Mod assist;+2 for physical assistance     General bed mobility comments: mod A +2 to initiate transfer due to pt initial refusal, use of pads to scoot hips to EOB  Transfers Overall transfer level: Needs assistance Equipment used: Rolling walker (2 wheeled) Transfers: Sit to/from Stand   Stand pivot transfers: Max assist;+2 safety/equipment       General transfer comment: max A +2 to rise and steady with RW, cues for weight shifting and posture    Balance Overall balance assessment: Needs  assistance Sitting-balance support: Feet supported;Single extremity supported Sitting balance-Leahy Scale: Fair Sitting balance - Comments: able to sit EOB with supervision, LUE supporting self on rail   Standing balance support: Bilateral upper extremity supported Standing balance-Leahy Scale: Poor Standing balance comment: reliant on external support                           ADL either performed or assessed with clinical judgement   ADL Overall ADL's : Needs assistance/impaired Eating/Feeding: Supervision/ safety;Minimal assistance;Bed level Eating/Feeding Details (indicate cue type and reason): set up for lunch in bed at end of session. Pt able to self feed but needing tray set up                     Toilet Transfer: Maximal assistance;+2 for physical assistance;+2 for safety/equipment Toilet Transfer Details (indicate cue type and reason): simulated at side of bed Toileting- Clothing Manipulation and Hygiene: Total assistance Toileting - Clothing Manipulation Details (indicate cue type and reason): for pericare     Functional mobility during ADLs: Maximal assistance;+2 for physical assistance;+2 for safety/equipment;Cueing for safety General ADL Comments: pt continues to be self limiting, but can be encouraged with use of RN staff. Focused on BADL progression and strength     Vision   Vision Assessment?: Vision impaired- to be further tested in functional context Additional Comments: cont to be difficult to assess   Perception     Praxis      Cognition Arousal/Alertness: Awake/alert Behavior During Therapy: Flat affect Overall Cognitive Status:  Impaired/Different from baseline Area of Impairment: Following commands;Safety/judgement;Problem solving                       Following Commands: Follows one step commands with increased time Safety/Judgement: Decreased awareness of safety;Decreased awareness of deficits   Problem Solving:  Difficulty sequencing;Requires verbal cues;Requires tactile cues;Slow processing General Comments: pt needig max encouragement from therapists and RN staff to participate. needs concrete details of treatment        Exercises     Shoulder Instructions       General Comments      Pertinent Vitals/ Pain       Pain Assessment: No/denies pain  Home Living                                          Prior Functioning/Environment              Frequency  Min 1X/week        Progress Toward Goals  OT Goals(current goals can now be found in the care plan section)  Progress towards OT goals: Not progressing toward goals - comment(remains self limiting)  Acute Rehab OT Goals Patient Stated Goal: pt did not state OT Goal Formulation: With patient Time For Goal Achievement: 05/19/19 Potential to Achieve Goals: [place] Discharge plan remains appropriate;Frequency remains appropriate    Co-evaluation    PT/OT/SLP Co-Evaluation/Treatment: Yes Reason for Co-Treatment: To address functional/ADL transfers PT goals addressed during session: Mobility/safety with mobility OT goals addressed during session: ADL's and self-care      AM-PAC OT "6 Clicks" Daily Activity     Outcome Measure   Help from another person eating meals?: A Little Help from another person taking care of personal grooming?: A Lot Help from another person toileting, which includes using toliet, bedpan, or urinal?: Total Help from another person bathing (including washing, rinsing, drying)?: A Lot Help from another person to put on and taking off regular upper body clothing?: A Little Help from another person to put on and taking off regular lower body clothing?: A Lot 6 Click Score: 13    End of Session Equipment Utilized During Treatment: Gait belt;Rolling walker  OT Visit Diagnosis: Hemiplegia and hemiparesis;Other symptoms and signs involving cognitive function Symptoms and signs  involving cognitive functions: Cerebral infarction Hemiplegia - Right/Left: Right Hemiplegia - dominant/non-dominant: Dominant Hemiplegia - caused by: Cerebral infarction   Activity Tolerance Patient tolerated treatment well   Patient Left in bed;with call bell/phone within reach;with bed alarm set   Nurse Communication Mobility status        Time: 1761-6073 OT Time Calculation (min): 26 min  Charges: OT General Charges $OT Visit: 1 Visit OT Treatments $Self Care/Home Management : 8-22 mins  Zenovia Jarred, MSOT, OTR/L Acute Rehabilitation Services [facility] Office Number: [phone]  Zenovia Jarred 05/05/2019, 4:21 PM

## 2019-05-05 NOTE — Progress Notes (Signed)
Physical Therapy Treatment Patient Details Name: Philip Vasquez MRN: [id number] DOB: January 30, 1961 Today's Date: 05/05/2019    History of Present Illness [name] is a 59 year old gentleman with no past medical history who presented with AMS and right-sided weakness. MRI: Widely scattered small acute infarcts in the medial lefthemisphere involving left ACA and left ACA/MCA watershed area.    PT Comments    Pt requiring max encouragement from PT/OT and RN to participate in session. Requiring two person maximal assist to stand with use of walker. Pt deferring further mobility or transfer to chair. Continues with right sided weakness, impaired tone, balance deficits, cognitive impairments, and poor balance. Continue to recommend SNF for ongoing Physical Therapy.     Follow Up Recommendations  SNF;Supervision/Assistance - 24 hour     Equipment Recommendations  Wheelchair (measurements PT);Wheelchair cushion (measurements PT);Hospital bed;Other (comment)    Recommendations for Other Services       Precautions / Restrictions Precautions Precautions: Fall Precaution Comments: R UE/LE weakness/tone Restrictions Weight Bearing Restrictions: No    Mobility  Bed Mobility Overal bed mobility: Needs Assistance Bed Mobility: Sit to Supine;Supine to Sit     Supine to sit: +2 for physical assistance;Mod assist Sit to supine: Mod assist   General bed mobility comments: ModA + 2 for initation, progression of RLE off edge of bed, trunk assist up to sitting  Transfers Overall transfer level: Needs assistance Equipment used: Rolling walker (2 wheeled) Transfers: Sit to/from Stand          General transfer comment: MaxA + 2 to stand from edge of bed; cues provided for weight shifting and upright posture.  Ambulation/Gait                 Stairs             Wheelchair Mobility    Modified Rankin (Stroke Patients Only) Modified Rankin (Stroke Patients Only) Pre-Morbid  Rankin Score: Moderate disability Modified Rankin: Severe disability     Balance Overall balance assessment: Needs assistance Sitting-balance support: Feet supported;Single extremity supported Sitting balance-Leahy Scale: Fair Sitting balance - Comments: Progressing to supervision level   Standing balance support: Bilateral upper extremity supported Standing balance-Leahy Scale: Poor Standing balance comment: reliant on external support                            Cognition Arousal/Alertness: Awake/alert Behavior During Therapy: Flat affect Overall Cognitive Status: Impaired/Different from baseline Area of Impairment: Safety/judgement;Problem solving                       Following Commands: Follows one step commands with increased time Safety/Judgement: Decreased awareness of safety;Decreased awareness of deficits   Problem Solving: Slow processing;Decreased initiation;Requires verbal cues;Requires tactile cues General Comments: Pt requiring max encouragement from PT/OT and RN to participate; often with circular reasoning i.e. states he knows he needs increased assist but also that he can go home and his 88 y.o. mother will take care of him.      Exercises      General Comments        Pertinent Vitals/Pain Pain Assessment: Faces Faces Pain Scale: Hurts a little bit Pain Location: RLE with movement Pain Descriptors / Indicators: Guarding;Grimacing Pain Intervention(s): Limited activity within patient's tolerance;Monitored during session;Repositioned    Home Living                      Prior Function  PT Goals (current goals can now be found in the care plan section) Acute Rehab PT Goals Patient Stated Goal: pt did not state PT Goal Formulation: With patient Time For Goal Achievement: 05/19/19 Potential to Achieve Goals: Fair Progress towards PT goals: Not progressing toward goals - comment(self limiting)     Frequency    Min 1X/week      PT Plan Current plan remains appropriate    Co-evaluation PT/OT/SLP Co-Evaluation/Treatment: Yes Reason for Co-Treatment: Complexity of the patient's impairments (multi-system involvement);Necessary to address cognition/behavior during functional activity;For patient/therapist safety;To address functional/ADL transfers PT goals addressed during session: Mobility/safety with mobility OT goals addressed during session: ADL's and self-care      AM-PAC PT "6 Clicks" Mobility   Outcome Measure  Help needed turning from your back to your side while in a flat bed without using bedrails?: A Lot Help needed moving from lying on your back to sitting on the side of a flat bed without using bedrails?: Total Help needed moving to and from a bed to a chair (including a wheelchair)?: Total Help needed standing up from a chair using your arms (e.g., wheelchair or bedside chair)?: Total Help needed to walk in hospital room?: Total Help needed climbing 3-5 steps with a railing? : Total 6 Click Score: 7    End of Session Equipment Utilized During Treatment: Gait belt Activity Tolerance: Other (comment)(self limiting) Patient left: in bed;with call bell/phone within reach;with bed alarm set Nurse Communication: Mobility status PT Visit Diagnosis: Other abnormalities of gait and mobility (R26.89);Hemiplegia and hemiparesis;Muscle weakness (generalized) (M62.81) Hemiplegia - Right/Left: Right Hemiplegia - dominant/non-dominant: Dominant Hemiplegia - caused by: Cerebral infarction     Time: 1341-1404 PT Time Calculation (min) (ACUTE ONLY): 23 min  Charges:  $Therapeutic Activity: 8-22 mins                     Laurina Bustle, PT, DPT Acute Rehabilitation Services Pager [phone] Office [phone]    Norval Morton 05/05/2019, 4:27 PM

## 2019-05-05 NOTE — TOC Progression Note (Signed)
Transition of Care [facility]) - Progression Note    Patient Details  Name: Philip Vasquez MRN: [id number] Date of Birth: May 17, 1960  [facility]) CM/SW Contact  Doy Hutching, [place] Phone Number: 05/05/2019, 11:30 AM  Clinical Narrative:    CSW and TOC team still without placement for pt- barrier remains disability pending status for pt.    Expected Discharge Plan: Skilled Nursing Facility Barriers to Discharge: SNF Pending payor source - LOG, SNF Pending bed offer, Inadequate or no insurance, Active Substance Use - Placement  Expected Discharge Plan and Services Expected Discharge Plan: Skilled Nursing Facility In-house Referral: Clinical Social Work Discharge Planning Services: CM Consult  Readmission Risk Interventions No flowsheet data found.

## 2019-05-06 LAB — CBC
HCT: 36.9 % — ABNORMAL LOW (ref 39.0–52.0)
Hemoglobin: 11.5 g/dL — ABNORMAL LOW (ref 13.0–17.0)
MCH: 26.6 pg (ref 26.0–34.0)
MCHC: 31.2 g/dL (ref 30.0–36.0)
MCV: 85.4 fL (ref 80.0–100.0)
Platelets: 248 10*3/uL (ref 150–400)
RBC: 4.32 MIL/uL (ref 4.22–5.81)
RDW: 14.7 % (ref 11.5–15.5)
WBC: 5 10*3/uL (ref 4.0–10.5)
nRBC: 0 % (ref 0.0–0.2)

## 2019-05-06 LAB — BASIC METABOLIC PANEL
Anion gap: 8 (ref 5–15)
BUN: 21 mg/dL — ABNORMAL HIGH (ref 6–20)
CO2: 26 mmol/L (ref 22–32)
Calcium: 9.4 mg/dL (ref 8.9–10.3)
Chloride: 107 mmol/L (ref 98–111)
Creatinine, Ser: 1.47 mg/dL — ABNORMAL HIGH (ref 0.61–1.24)
GFR calc Af Amer: >60 mL/min (ref >60)
GFR calc non Af Amer: 52 mL/min — ABNORMAL LOW (ref >60)
Glucose, Bld: 100 mg/dL — ABNORMAL HIGH (ref 70–99)
Potassium: 4.2 mmol/L (ref 3.5–5.1)
Sodium: 141 mmol/L (ref 135–145)

## 2019-05-06 NOTE — Progress Notes (Signed)
   Subjective:   Seen at the bedside today.  Patient has no complaints at this time. We discussed his mood and he says he is feeling a little better.  I asked him if he is up to working with a therapist today, and he said he may.  I asked him to give it a try patient said okay.  Objective:  Vital signs in last 24 hours: Vitals:   05/05/19 2041 05/06/19 0004 05/06/19 0404 05/06/19 0825  BP: 126/79 133/87 121/83 (!) 140/100  Pulse: 74 75 62 62  Resp: 18 18 19 17   Temp: 98.6 F (37 C) 99.4 F (37.4 C) 98.1 F (36.7 C) 98.1 F (36.7 C)  TempSrc: Oral Oral Oral Oral  SpO2: 95%  95% 98%  Weight:      Height:       Physical Exam Const: In no apparent distress, lying comfortably in bed watching TV HEENT: Atraumatic, normocephalic CV: RRR, no murmurs, gallop, rub PULM: CTAB ABD: Soft and non tender Psych: Flat affect  Assessment/Plan:  Principal Problem:   Cerebral thrombosis with cerebral infarction Active Problems:   Right sided weakness   Anemia of chronic disease   Urinary tract infection  In summary, Mr. Squillace is a 59 year old male with a past medical history of substance use who was admitted forL ACA/MCA watershed infarctresulting in right-sided upper and lower extremity deficits.He is currently medically stable for discharge, however due to his uninsured status awaiting SNF placement.  #L ACA/MCA watershed infarct  -Aspirin 81 mg daily  -Rosuvastatin 40 mg daily  -Continue PT/OT  #Depressed mood -Fluoxetine 80 mg daily -Consider adding on Wellbutrin in the future for mood  #HTN -Losartan 50 mg daily -Amlodipine 10 mg daily -Metoprolol 25 mg daily -Hydralazine 75 mg daily   #FEN/GI -Diet: Regular -Fluids: None -MiraLAX daily -Senokot 2 tablets daily  #DVT prophylaxis -Lovenox 40 mg subq injections daily  #CODE STATUS: FULL  Dispo Prior to Admission Living Arrangement: Home Anticipated Discharge Location: SNF  Barriers to Discharge: Pending  bed availability   41, MD Internal Medicine, PGY1 Pager: [phone]  05/06/2019,8:36 AM

## 2019-05-07 NOTE — Progress Notes (Signed)
   Subjective:   Seen at the bedside today.  We discussed consulting palliative care to evaluate goals of care for the future. Patient is in agreement. No other other complaints at this time.  Objective:  Vital signs in last 24 hours: Vitals:   05/06/19 1646 05/06/19 2025 05/06/19 2322 05/07/19 0352  BP: (!) 142/87 134/85 117/78 (!) 142/91  Pulse: 64 69 61 (!) 55  Resp: 18 18 17 18   Temp: 97.6 F (36.4 C) (!) 97.5 F (36.4 C) 98.1 F (36.7 C) (!) 97.4 F (36.3 C)  TempSrc: Oral Oral Oral Oral  SpO2: 97% 97% 98% 97%  Weight:      Height:       Physical Exam Const: In no apparent distress, lying comfortably in bed watching TV HEENT: Atraumatic, normocephalic CV: RRR, no murmurs, gallop, rub PULM: CTAB ABD: Soft and non tender Psych: Flat affect  Assessment/Plan:  Principal Problem:   Cerebral thrombosis with cerebral infarction Active Problems:   Right sided weakness   Anemia of chronic disease   Urinary tract infection  In summary, Philip Vasquez is a 59 year old male with a past medical history of substance use who was admitted forL ACA/MCA watershed infarctresulting in right-sided upper and lower extremity deficits.He is currently medically stable for discharge, however due to his uninsured status awaiting SNF placement.  #L ACA/MCA watershed infarct  -Aspirin 81 mg daily  -Rosuvastatin 40 mg daily  -Continue PT/OT  #Depressed mood -Fluoxetine 80 mg daily -Consider adding on Wellbutrin in the future for mood -Consult palliative care   #HTN -Losartan 50 mg daily -Amlodipine 10 mg daily -Metoprolol 25 mg daily -Hydralazine 75 mg daily   #FEN/GI -Diet: Regular -Fluids: None -MiraLAX daily -Senokot 2 tablets daily  #DVT prophylaxis -Lovenox 40 mg subq injections daily  #CODE STATUS: FULL  Dispo Prior to Admission Living Arrangement: Home Anticipated Discharge Location: SNF  Barriers to Discharge: Pending bed availability   41,  MD Internal Medicine, PGY1 Pager: [phone]  05/07/2019,7:03 AM

## 2019-05-08 DIAGNOSIS — Z7189 Other specified counseling: Secondary | ICD-10-CM

## 2019-05-08 DIAGNOSIS — Z659 Problem related to unspecified psychosocial circumstances: Secondary | ICD-10-CM

## 2019-05-08 DIAGNOSIS — Z515 Encounter for palliative care: Secondary | ICD-10-CM

## 2019-05-08 DIAGNOSIS — F339 Major depressive disorder, recurrent, unspecified: Secondary | ICD-10-CM

## 2019-05-08 DIAGNOSIS — M6281 Muscle weakness (generalized): Secondary | ICD-10-CM

## 2019-05-08 NOTE — Progress Notes (Signed)
   Subjective:   Seen at the bedside today.  No complaints at this time.  Appears comfortable.  Objective:  Vital signs in last 24 hours: Vitals:   05/07/19 1500 05/07/19 1958 05/08/19 0012 05/08/19 0416  BP: 128/88 135/88 122/83 135/90  Pulse: 65 70 65 (!) 55  Resp: 18 17 18 16   Temp: 98.7 F (37.1 C) 97.6 F (36.4 C) 97.8 F (36.6 C) 97.8 F (36.6 C)  TempSrc: Oral Oral Oral Oral  SpO2: 97% 98% 99% 97%  Weight:      Height:       Physical Exam Const: In no apparent distress, resting in bed HEENT: Atraumatic, normocephalic CV: RRR, no murmurs, gallop, rub PULM: CTAB ABD: Soft and non tender Psych: Flat affect  Assessment/Plan:  Principal Problem:   Cerebral thrombosis with cerebral infarction Active Problems:   Right sided weakness   Anemia of chronic disease   Urinary tract infection  In summary, Philip Vasquez is a 59 year old male with a past medical history of substance use who was admitted forL ACA/MCA watershed infarctresulting in right-sided upper and lower extremity deficits.He is currently medically stable for discharge, however due to his uninsured status awaiting SNF placement.  #L ACA/MCA watershed infarct w/ RU & RL Extremity Deficits -Aspirin 81 mg daily  -Rosuvastatin 40 mg daily  -Continue PT/OT  #Depressed mood -Fluoxetine 80 mg daily -Consider adding on Wellbutrin in the future for mood -Consult palliative care   #HTN -Losartan 50 mg daily -Amlodipine 10 mg daily -Metoprolol 25 mg daily -Hydralazine 75 mg daily   #FEN/GI -Diet: Regular -Fluids: None -MiraLAX daily -Senokot 2 tablets daily  #DVT prophylaxis -Lovenox 40 mg subq injections daily  #CODE STATUS: FULL  Dispo Prior to Admission Living Arrangement: Home Anticipated Discharge Location: SNF  Barriers to Discharge: Pending bed availability   41, MD Internal Medicine, PGY1 Pager: [phone]  05/08/2019,7:47 AM

## 2019-05-08 NOTE — Plan of Care (Signed)
  Problem: Education: Goal: Knowledge of General Education information will improve Description: Including pain rating scale, medication(s)/side effects and non-pharmacologic comfort measures Outcome: Progressing   Problem: Health Behavior/Discharge Planning: Goal: Ability to manage health-related needs will improve Outcome: Progressing   Problem: Clinical Measurements: Goal: Ability to maintain clinical measurements within normal limits will improve Outcome: Progressing Goal: Will remain free from infection Outcome: Progressing Goal: Diagnostic test results will improve Outcome: Progressing   Problem: Activity: Goal: Risk for activity intolerance will decrease Outcome: Progressing   Problem: Elimination: Goal: Will not experience complications related to urinary retention Outcome: Progressing   Problem: Safety: Goal: Ability to remain free from injury will improve Outcome: Progressing   Problem: Skin Integrity: Goal: Risk for impaired skin integrity will decrease Outcome: Progressing   Problem: Education: Goal: Knowledge of disease or condition will improve Outcome: Progressing Goal: Knowledge of secondary prevention will improve Outcome: Progressing Goal: Knowledge of patient specific risk factors addressed and post discharge goals established will improve Outcome: Progressing Goal: Individualized Educational Video(s) Outcome: Progressing   Problem: Coping: Goal: Will verbalize positive feelings about self Outcome: Progressing Goal: Will identify appropriate support needs Outcome: Progressing   Problem: Health Behavior/Discharge Planning: Goal: Ability to manage health-related needs will improve Outcome: Progressing   Problem: Self-Care: Goal: Ability to participate in self-care as condition permits will improve Outcome: Progressing Goal: Verbalization of feelings and concerns over difficulty with self-care will improve Outcome: Progressing   Problem:  Ischemic Stroke/TIA Tissue Perfusion: Goal: Complications of ischemic stroke/TIA will be minimized Outcome: Progressing   

## 2019-05-08 NOTE — Consult Note (Addendum)
Consultation Note Date: 05/08/2019   Patient Name: Philip Vasquez  DOB: [birth year]  MRN: [id number]  Age / Sex: 59 y.o., male  PCP: Patient, No Pcp Per Referring Physician: Lucious Groves, DO  Reason for Consultation: Establishing goals of care per family request  HPI/Patient Profile:  Per hospitalist note --> In summary, Philip Vasquez is a 59 year old male with a past medical history of substance use who was admitted forL ACA/MCA watershed infarctresulting in right-sided upper and lower extremity deficits.He is currently medically stable for discharge, however due to his uninsured status awaiting SNF placement. He has been hospitalized for 140 days.  Palliative care was asked to be involved to help address goals of care.  Through review of recent physical therapy notes it appears that Philip Vasquez requires tremendous effort to mobilize, he is a two person maximum assistance.   Clinical Assessment and Goals of Care: I have reviewed medical records including EPIC notes, labs and imaging, assessed the patient.   I met with Philip Vasquez to further discuss diagnosis prognosis, GOC, EOL wishes, disposition and options.  Philip Vasquez was in bed watching football. He was in no distress appearing comfortable.    I introduced Palliative Medicine as specialized medical care for people living with serious illness. It focuses on providing relief from the symptoms and stress of a serious illness. The goal is to improve quality of life for both the patient and the family.  I asked Philip Vasquez to explain to me why he was in the hospital. He stated that he had a rather large stroke, which has now left him debilitated. He said that they had been waiting to find him a rehabilitation facility which has not yet happened. I asked him why he feels he suffered a stoke. He told me that he had been using illicit drugs (crack) regularly likely causing  this event. We talked about how he is feeling since this occurred. He was rather flat, it was difficult to illicit meaningful responses. I asked him if he feels his life has changed. He said that his life is forever changed because he can't move his right side on his own anymore. We talked about how he feels which made Philip Vasquez more withdrawn. He thereafter answered for the most part yes and no.   I asked Philip Vasquez to explain to me what life will look like after rehabilitation, who he has to help care for him. He shared that he had been living with his mother, Philip Vasquez prior to this event. He was able perform all of his own bADLS, IADLS and was driving. I asked given that his mother is older if he feels she will be able to help care for him at home which he feels that she will. I shared that I am concerned about this as he requires quite a bit of help. He said that he is not worried about it. I asked him for permission to call his mother to discuss this further which he said NO to.   We talked about code  status. He presently is enlisted as full code. We talked about what CPR would look like in his situation as well as intubation. He said that he does not know if he would want these things. I told him that it is a lot to think about. I asked him if he would like some time to process the discussion which he answered yes to. Brought up the topic of Palliative following him when he gets home which he also wanted to think about.  Discussed with patient the importance of continued conversation with family and their  medical providers regarding overall plan of care and treatment options, ensuring decisions are within the context of the patients values and GOCs.  Decision Maker: Philip Vasquez (Self)    SUMMARY OF RECOMMENDATIONS   Remains Full Code  Awaiting SNF bed, complicated as patient is w/o insurance  Patient hopes to eventually go back home, though not convinced this is a safe plan as he would be unable to  care for himself and rely upon his mother.  Plan to check in tomorrow to discuss code status again. Patient had requested a day to think about it.  Plan to follow up on whether the patient would want Palliative care follow up.   Code Status/Advance Care Planning:  Full code  Symptom Management:  Depression:  - Per primary  - On prozac, has been titrated recently L ACA/MCA watershed infarct w/ RU & RL Extremity Deficits:  - Physical therapy  - Occupational therapy Complex Social Situation:  - Per notes says pending payor source, has been awaiting disability approval  - Per discussion w/ MSW had been approved for medicaid Palliative Prophylaxis:   Aspiration, Bowel Regimen, Delirium Protocol, Eye Care, Frequent Pain Assessment, Oral Care, Palliative Wound Care and Turn Reposition  Additional Recommendations (Limitations, Scope, Preferences):  Full Scope Treatment  Psycho-social/Spiritual:   Desire for further Chaplaincy support: No  Additional Recommendations:  Therapeutic Listening  Prognosis:   Unable to determine  Discharge Planning: Skilled Nursing Facility    Primary Diagnoses: Present on Admission: . Cerebral thrombosis with cerebral infarction . (Resolved) Cocaine use disorder ([name]) . Anemia of chronic disease  I have reviewed the medical record, interviewed the patient and family, and examined the patient. The following aspects are pertinent.  Past Medical History:  Diagnosis Date  . Cocaine use disorder ([name]) 12/21/2018   Social History   Socioeconomic History  . Marital status: Single    Spouse name: Not on file  . Number of children: Not on file  . Years of education: Not on file  . Highest education level: Not on file  Occupational History  . Not on file  Tobacco Use  . Smoking status: Never Smoker  . Smokeless tobacco: Never Used  Substance and Sexual Activity  . Alcohol use: Yes    Comment: 2 cans of beer (natural lite)/day  . Drug use:  Never  . Sexual activity: Not on file  Other Topics Concern  . Not on file  Social History Narrative  . Not on file   Social Determinants of Health   Financial Resource Strain:   . Difficulty of Paying Living Expenses: Not on file  Food Insecurity:   . Worried About Charity fundraiser in the Last Year: Not on file  . Ran Out of Food in the Last Year: Not on file  Transportation Needs:   . Lack of Transportation (Medical): Not on file  . Lack of Transportation (Non-Medical): Not on file  Physical Activity:   . Days of Exercise per Week: Not on file  . Minutes of Exercise per Session: Not on file  Stress:   . Feeling of Stress : Not on file  Social Connections:   . Frequency of Communication with Friends and Family: Not on file  . Frequency of Social Gatherings with Friends and Family: Not on file  . Attends Religious Services: Not on file  . Active Member of Clubs or Organizations: Not on file  . Attends Archivist Meetings: Not on file  . Marital Status: Not on file   smokes 3 crack rocks per day drinks 3 beers per day Denies tobacco abuse and no other drug use  Family History: Denies family history of stroke. Denies DM, HTN, cardiac problems, CP, SOB  Scheduled Meds: . aspirin EC  81 mg Oral Daily  . baclofen  5 mg Oral BID  . enoxaparin (LOVENOX) injection  0.5 mg/kg Subcutaneous Q24H  . feeding supplement (ENSURE ENLIVE)  237 mL Oral Q1500  . feeding supplement (PRO-STAT SUGAR FREE 64)  30 mL Oral BID  . FLUoxetine  80 mg Oral Daily  . folic acid  1 mg Oral Daily  . Gerhardt's butt cream   Topical BID  . hydrALAZINE  25 mg Oral TID  . hydrocerin   Topical BID  . hydrocortisone cream   Topical BID  . loratadine  10 mg Oral Daily  . mouth rinse  15 mL Mouth Rinse BID  . metoprolol tartrate  25 mg Oral BID  . mirtazapine  7.5 mg Oral QHS  . multivitamin with minerals  1 tablet Oral Daily  . pantoprazole  40 mg Oral Daily  . polyethylene glycol  17 g  Oral Daily  . rosuvastatin  40 mg Oral q1800  . senna-docusate  2 tablet Oral QHS  . thiamine  100 mg Oral Daily   Continuous Infusions: PRN Meds:.acetaminophen **OR** acetaminophen Medications Prior to Admission:  Prior to Admission medications   Not on File   No Known Allergies Review of Systems  Constitutional: Positive for activity change.   Physical Exam Vitals and nursing note reviewed.  HENT:     Head: Normocephalic.     Nose: Nose normal.     Mouth/Throat:     Mouth: Mucous membranes are moist.  Eyes:     Pupils: Pupils are equal, round, and reactive to light.  Cardiovascular:     Rate and Rhythm: Normal rate and regular rhythm.     Pulses: Normal pulses.  Pulmonary:     Effort: Pulmonary effort is normal.  Abdominal:     General: Abdomen is flat. Bowel sounds are normal.  Musculoskeletal:     Cervical back: Normal range of motion.     Comments: Limited ROM  Skin:    General: Skin is warm and dry.     Capillary Refill: Capillary refill takes less than 2 seconds.  Neurological:     Mental Status: He is alert and oriented to person, place, and time.  Psychiatric:     Comments: Flat     Vital Signs: BP 135/90 (BP Location: Left Arm)   Pulse (!) 55   Temp 97.8 F (36.6 C) (Oral)   Resp 16   Ht '5\' 9"'$  (1.753 m)   Wt 132 kg   SpO2 97%   BMI 42.97 kg/m  Pain Scale: 0-10 POSS *See Group Information*: 1-Acceptable,Awake and alert Pain Score: 0-No pain  SpO2: SpO2: 97 % O2  Device:SpO2: 97 % O2 Flow Rate: .O2 Flow Rate (L/min): 2 L/min  IO: Intake/output summary:   Intake/Output Summary (Last 24 hours) at 05/08/2019 0645 Last data filed at 05/08/2019 0556 Gross per 24 hour  Intake --  Output 800 ml  Net -800 ml   LBM: Last BM Date: 05/08/19 Baseline Weight: Weight: 102.1 kg Most recent weight: Weight: 132 kg     Palliative Assessment/Data: 40%   Time In: 0810 Time Out: 0920 Time Total: 70 Greater than 50%  of this time was spent counseling and  coordinating care related to the above assessment and plan.  Signed by: Rosezella Rumpf, NP   Please contact Palliative Medicine Team phone at [phone] for questions and concerns.  For individual provider: See Shea Evans

## 2019-05-09 NOTE — Progress Notes (Signed)
   Subjective:   Pt seen at the bedside today.  I asked the patient how his discussion with palliative care yesterday.  He said he went fine.  When I implored about specifics of the conversation, the patient said he "talk about certain things" but was not forthcoming with his responses.  No complaints at this time.  Objective:  Vital signs in last 24 hours: Vitals:   05/08/19 2216 05/09/19 0024 05/09/19 0350 05/09/19 0700  BP: (!) 143/93 (!) 155/97 137/89 (!) 158/98  Pulse: 62 (!) 59 (!) 59 60  Resp:  17 17 18   Temp:  97.8 F (36.6 C) 97.7 F (36.5 C) 98.4 F (36.9 C)  TempSrc:  Oral Oral Oral  SpO2: 100% 96% 98% 99%  Weight:      Height:       Physical Exam Const: In no apparent distress, resting in bed HEENT: Atraumatic, normocephalic CV: RRR, no murmurs, gallop, rub PULM: CTAB ABD: Soft and non tender Psych: Flat affect  Assessment/Plan:  Principal Problem:   Cerebral thrombosis with cerebral infarction Active Problems:   Right sided weakness   Anemia of chronic disease   Urinary tract infection   Palliative care by specialist   Goals of care, counseling/discussion   Depression, recurrent (HCC)   Concerned about having social problem   Muscular weakness  In summary, Philip Vasquez is a 59 year old male with a past medical history of substance use who was admitted forL ACA/MCA watershed infarctresulting in right-sided upper and lower extremity deficits.He is currently medically stable for discharge, however due to his uninsured status awaiting SNF placement.  #L ACA/MCA watershed infarct w/ RU & RL Extremity Deficits -Aspirin 81 mg daily  -Rosuvastatin 40 mg daily  -Continue PT/OT  #Depressed mood -Fluoxetine 80 mg daily -Consider adding on Wellbutrin in the future for mood -Palliative care consult, and we appreciate the recommendations  -Continue discussions on ongoing goals of care and code status  #HTN -Losartan 50 mg daily -Amlodipine 10 mg  daily -Metoprolol 25 mg daily -Hydralazine 75 mg daily   #FEN/GI -Diet: Regular -Fluids: None -MiraLAX daily -Senokot 2 tablets daily  #DVT prophylaxis -Lovenox 40 mg subq injections daily  #CODE STATUS: FULL  Dispo Prior to Admission Living Arrangement: Home Anticipated Discharge Location: SNF  Barriers to Discharge: Bed availability, lack of payer source  41, MD Internal Medicine, PGY1 Pager: [phone]  05/09/2019,10:18 AM

## 2019-05-09 NOTE — Progress Notes (Signed)
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 He has been hospitalized for 141 days.  Palliative care was asked to be involved to help address goals of care.  Through review of recent physical therapy notes it appears that Philip Vasquez requires tremendous effort to mobilize, he is a two person maximum assistance.   Today's Discussion (05/09/2019): Chart reviewed. Met with Philip Vasquez at bedside to follow up on yesterdays conversation. He stated that he is feeling cold, but otherwise has no complaints.   I asked Philip Vasquez if there was anything that I could help in clarifying now that he has had a night to think about our discussion. He shook his head no. I asked if he had thought about his code status or a Palliative provider follow up and he shook his head no.   Philip Vasquez remains to have a very flat affect. Unable to illicit much information from him.   We will continue to check in regarding his goals for future incrementally.   Discussed with patient the importance of continued conversation with family and their  medical providers regarding overall plan of care and treatment options, ensuring decisions are within the context of the patients values and GOCs.  Questions and concerns addressed   Vital Signs Vitals:   05/09/19 0700 05/09/19 1100  BP: (!) 158/98 (!) 130/93  Pulse: 60 64  Resp: 18 18  Temp: 98.4 F (36.9 C) 98.4 F (36.9 C)  SpO2: 99% 97%    Intake/Output Summary (Last 24 hours) at 05/09/2019 1233 Last data filed at 05/09/2019 8841 Gross per 24 hour  Intake --  Output 2000 ml  Net -2000 ml   Last Weight  Most recent update: 03/26/2019  5:04 AM    Weight  132 kg (291 lb 0.1 oz)            Physical Exam Vitals and nursing note reviewed.  HENT:     Head: Normocephalic.     Nose: Nose normal.     Mouth/Throat:     Mouth: Mucous membranes are moist.  Eyes:     Pupils: Pupils are equal, round, and reactive to light.  Cardiovascular:     Rate and Rhythm: Normal rate and regular rhythm.     Pulses: Normal pulses.  Pulmonary:     Effort: Pulmonary effort is normal.  Abdominal:     General: Abdomen is flat. Bowel sounds are normal.  Musculoskeletal:     Cervical back: Normal range of motion.     Comments: Limited ROM  Skin:    General: Skin is warm and dry.     Capillary Refill: Capillary refill takes less than 2 seconds.  Neurological:     Mental Status: He is alert and oriented to person, place, and time.  Psychiatric:     Comments: Flat  SUMMARY OF RECOMMENDATIONS   Remains Full Code  Awaiting SNF bed, complicated as patient is w/o insurance  Patient hopes to eventually go back home, though not convinced this is a safe plan as he would be unable to care for himself and rely upon his mother.  Plan to follow up on whether the patient would want Palliative care follow up.   Code  Status/Advance Care Planning:  Full code  Symptom Management:  Depression:                 - Per primary                 - On prozac, has been titrated recently L ACA/MCA watershed infarctw/ RU & RL Extremity Deficits:                 - Physical therapy                 - Occupational therapy Complex Social Situation:                 - Per notes says pending payor source, has been awaiting disability approval                 - Per discussion w/ MSW had been approved for medicaid   Time Spent: 15 Greater than 50% of the time was spent in counseling and coordination of care ______________________________________________________________________________________ Michelle Ferolito [place] Palliative Medicine Team Team Cell  Phone: [phone] Please utilize secure chat with additional questions, if there is no response within 30 minutes please call the above phone number  Palliative Medicine Team providers are available by phone from 7am to 7pm daily and can be reached through the team cell phone.  Should this patient require assistance outside of these hours, please call the patient's attending physician.     

## 2019-05-10 NOTE — Progress Notes (Signed)
   Subjective: HD#142 Overnight, no acute events reported.   Pt seen at the bedside today. He reports that he is doing well this morning. He is sitting up in bed eating breakfast. He denies any acute concerns at this time.   Objective:  Vital signs in last 24 hours: Vitals:   05/09/19 2012 05/09/19 2214 05/10/19 0004 05/10/19 0426  BP: 132/81 133/89 129/84 137/88  Pulse: 74 75 71 61  Resp: 18  18 18   Temp: 98.5 F (36.9 C)  98 F (36.7 C) 98.6 F (37 C)  TempSrc: Oral  Oral Oral  SpO2: 98% 97% 95% 99%  Weight:      Height:       Physical Exam Physical Exam  Constitutional: He is well-developed, well-nourished, and in no distress.  Cardiovascular: Normal rate, regular rhythm, normal heart sounds and intact distal pulses. Exam reveals no gallop and no friction rub.  No murmur heard. Pulmonary/Chest: Effort normal and breath sounds normal. No respiratory distress.  Abdominal: Soft. Bowel sounds are normal.  Skin: Skin is warm and dry.  Psychiatric: Affect normal.    Assessment/Plan: Mr. 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 He is interactive with normal affect.  - Palliative care consulted, appreciate their recommendations with ongoing goals of care discussion -Fluoxetine 80 mg daily -Consider adding on Wellbutrin in the future for mood  Hypertension Currently well controlled w/SBP 120-130s.  -Losartan 50 mg daily -Amlodipine 10 mg daily -Metoprolol 25 mg daily -Hydralazine 75 mg daily   FEN/GI -Diet: Regular -Fluids: None -MiraLAX daily -Senokot 2 tablets daily  DVT  prophylaxis -Lovenox 40 mg subq injections daily  CODE STATUS: FULL  Dispo Prior to Admission Living Arrangement: Home Anticipated Discharge Location: SNF  Barriers to Discharge: Bed availability, lack of payer source  41, MD Internal Medicine, PGY-1 Pager # [phone] 05/10/19   6:49 AM

## 2019-05-10 NOTE — Plan of Care (Signed)
  Problem: Education: Goal: Knowledge of General Education information will improve Description: Including pain rating scale, medication(s)/side effects and non-pharmacologic comfort measures Outcome: Progressing   Problem: Health Behavior/Discharge Planning: Goal: Ability to manage health-related needs will improve Outcome: Progressing   Problem: Clinical Measurements: Goal: Ability to maintain clinical measurements within normal limits will improve Outcome: Progressing Goal: Will remain free from infection Outcome: Progressing Goal: Diagnostic test results will improve Outcome: Progressing   Problem: Activity: Goal: Risk for activity intolerance will decrease Outcome: Progressing   Problem: Elimination: Goal: Will not experience complications related to urinary retention Outcome: Progressing   Problem: Safety: Goal: Ability to remain free from injury will improve Outcome: Progressing   Problem: Skin Integrity: Goal: Risk for impaired skin integrity will decrease Outcome: Progressing   Problem: Education: Goal: Knowledge of disease or condition will improve Outcome: Progressing Goal: Knowledge of secondary prevention will improve Outcome: Progressing Goal: Knowledge of patient specific risk factors addressed and post discharge goals established will improve Outcome: Progressing Goal: Individualized Educational Video(s) Outcome: Progressing   Problem: Coping: Goal: Will verbalize positive feelings about self Outcome: Progressing Goal: Will identify appropriate support needs Outcome: Progressing   Problem: Health Behavior/Discharge Planning: Goal: Ability to manage health-related needs will improve Outcome: Progressing   Problem: Self-Care: Goal: Ability to participate in self-care as condition permits will improve Outcome: Progressing Goal: Verbalization of feelings and concerns over difficulty with self-care will improve Outcome: Progressing   Problem:  Ischemic Stroke/TIA Tissue Perfusion: Goal: Complications of ischemic stroke/TIA will be minimized Outcome: Progressing   

## 2019-05-10 NOTE — Progress Notes (Signed)
   05/10/19 1020  Clinical Encounter Type  Visited With Patient  Visit Type Initial  Referral From Nurse  Consult/Referral To Chaplain  Stress Factors  Patient Stress Factors None identified   Chaplain responded to verbal request to check on [place]. Chaplain entered room and asked Draiden if he felt like a visit. Merwyn said he was not interested in a visit today, and thanked Chaplain for stopping by. Chaplains remain available for support as needs arise.   Chaplain Resident, Amado Coe, [place] [phone] on-call pager

## 2019-05-11 DIAGNOSIS — I6330 Cerebral infarction due to thrombosis of unspecified cerebral artery: Secondary | ICD-10-CM

## 2019-05-11 DIAGNOSIS — Z66 Do not resuscitate: Secondary | ICD-10-CM

## 2019-05-11 DIAGNOSIS — Z515 Encounter for palliative care: Secondary | ICD-10-CM

## 2019-05-11 NOTE — Progress Notes (Signed)
Physical Therapy Discharge Patient Details Name: Philip Vasquez MRN: [id number] DOB: 11-Feb-1961 Today's Date: 05/11/2019 Time:  1500-    Patient discharged from PT services secondary to patient has made no progress toward goals in a reasonable time frame and requests that therapy not come back to see him. Had a frank discussion with pt about his prognosis without therapy and he voiced understanding and says that he still does not want to do any more therapy. Pt repeatedly requesting to be left alone. PT/OT agreeable to signing off and let pt know if he changes his mind to speak to his nurse about a reconsult.   Please see latest therapy progress note for current level of functioning and progress toward goals.    Progress and discharge plan discussed with patient and/or caregiver: agrees  GP   Lyanne Co, PT  Acute Rehab Services  Pager [phone] Office [phone]   Lawana Chambers Torey Reinard 05/11/2019, 3:05 PM

## 2019-05-11 NOTE — Progress Notes (Signed)
   Subjective: HD#143 Overnight, no acute events reported.   Pt seen at the bedside today. He is resting comfortably in bed and does not report any acute concerns. He is watching sports and notes that he had a bacon omelette for breakfast this morning. He denies any pain or discomfort at this time.   Objective:  Vital signs in last 24 hours: Vitals:   05/10/19 1702 05/10/19 1938 05/10/19 2312 05/11/19 0405  BP: (!) 150/89 137/76 134/84 140/81  Pulse: 70 79 60 66  Resp:  19 19 19   Temp:  98 F (36.7 C) 97.8 F (36.6 C) 97.9 F (36.6 C)  TempSrc:  Oral Oral Oral  SpO2: 98% 94% 96% 98%  Weight:      Height:       Physical Exam Physical Exam  Constitutional: He is well-developed, well-nourished, and in no distress.  Cardiovascular: Normal rate, regular rhythm, normal heart sounds and intact distal pulses. Exam reveals no gallop and no friction rub.  No murmur heard. Pulmonary/Chest: Effort normal and breath sounds normal. No respiratory distress.  Abdominal: Soft. Bowel sounds are normal.  Skin: Skin is warm and dry.  Psychiatric: Affect normal.    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 He is interactive with normal affect.  - Palliative care consulted, appreciate their recommendations with ongoing goals of care discussion -Fluoxetine 80 mg daily -Consider adding on Wellbutrin in the future for mood  Hypertension Currently well controlled w/SBP 120-130s.  -Losartan 50 mg daily -Amlodipine 10 mg daily -Metoprolol 25 mg daily -Hydralazine 75 mg daily    FEN/GI -Diet: Regular -Fluids: None -MiraLAX daily -Senokot 2 tablets daily  DVT prophylaxis -Lovenox 40 mg subq injections daily  CODE STATUS: FULL  Dispo Prior to Admission Living Arrangement: Home Anticipated Discharge Location: SNF  Barriers to Discharge: Bed availability, lack of payer source  41, MD Internal Medicine, PGY-1 Pager # [phone] 05/11/19   6:21 AM

## 2019-05-11 NOTE — Progress Notes (Signed)
OT Cancellation Note  Patient Details Name: Philip Vasquez MRN: [id number] DOB: 09-11-1960   Cancelled Treatment:    Reason Eval/Treat Not Completed: Patient declined, no reason specified Pt declined therapy session stating "y'all need to go". Thorough education about implications of declining therapy services for third time, pt stated "I understand" and "I'm good like this". When therapist asked pt what his long term goals were, pt stated "I don't know". Pt continued to request therapists leave him alone. Therapists acknowledged their understanding. OT will sign off as pt has continued to decline services and verbalized understanding of therapy signing off.   Diona Browner OTR/L Acute Rehabilitation Services Office: [phone]   Rebeca Alert 05/11/2019, 2:30 PM

## 2019-05-11 NOTE — Progress Notes (Signed)
Patient ID: Philip Vasquez, male   DOB: [birth year], 59 y.o.   MRN: [id number]  This NP visited patient at the bedside as a follow up for palliative medicine needs and emotional support.  On admission, toxicology was positive for cocaine,and  head MRI on 12/19/2018 significant for: IMPRESSION: 1. Widely scattered small acute infarcts in the medial left hemisphere involving left ACA and left ACA/MCA watershed area. No associated hemorrhage or mass effect. 2. 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  Patient is awake and alert and sitting up in bed.  He is alert and oriented to person and place and is able to tell me that he has had a stroke.  Orlene Erm is appropriate but limited  Discussed important decisions that he faces secondary to his serious medical conditions.  Today is day 143 of this hospitalization.   He is stable and awaits bed placement; insurance and nursing care needs are the hurdles.  Patient is able to communicate his wish for no resuscitation or artificial feeding now or in the future.  He is stable and awaits bed placement; insurance and nursing care needs hurdles.  Tangential information is his mother is a patient on 3 E with overall physical and functional decline and likely transitioning to hospice on discharge.  According to the nursing unit this patient's brother is no longer privileged to visitation secondary to aggressive disruption to staff and unit function.  Patient tells me he does not want any contact with his mother.  He has no HPOA or AD and tells me that if he cannot speak for himself he would want his brother Casimiro Needle to speak for him.  Questions and concerns addressed   Discussed with nursing staff  Total time spent on the unit was 35 minutes  Greater than 50% of the time was spent in counseling and coordination of care  Lorinda Creed NP  Palliative  Medicine Team Team Phone # [phone] Pager [phone]

## 2019-05-12 NOTE — Progress Notes (Signed)
   Subjective: HD#143 Overnight, no acute events reported.   Pt seen at the bedside today. He is resting comfortably in bed and does not report any acute concerns. He is watching TV and denies any pain at this time. We discussed need to participate with PT/OT to facilitate SNF placement. Patient expresses understanding.   Objective:  Vital signs in last 24 hours: Vitals:   05/11/19 2007 05/11/19 2329 05/12/19 0400 05/12/19 0821  BP: (!) 142/81 136/85 129/90 (!) 153/95  Pulse: 76 68 (!) 59 64  Resp: 18 20 18 18   Temp: 98.9 F (37.2 C) 98.9 F (37.2 C) 98.2 F (36.8 C) 98.9 F (37.2 C)  TempSrc: Oral Oral Oral Oral  SpO2: 97% 97% 95% 99%  Weight:      Height:       Physical Exam Physical Exam  Constitutional: He is oriented to person, place, and time and well-developed, well-nourished, and in no distress.  Cardiovascular: Normal rate, regular rhythm, normal heart sounds and intact distal pulses. Exam reveals no gallop and no friction rub.  No murmur heard. Pulmonary/Chest: Effort normal and breath sounds normal. No respiratory distress.  Abdominal: Soft. Bowel sounds are normal. He exhibits no distension. There is no abdominal tenderness.  Neurological: He is alert and oriented to person, place, and time.  Skin: Skin is warm and dry.  Psychiatric: Affect normal.    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 Discussed with patient need for participation with PT/OT to facilitate SNF placement.  -Aspirin 81 mg daily  -Rosuvastatin 40 mg daily  - Continue PT/OT  Depressed mood Improved  this morning. He is interactive with normal affect.  - Palliative care consulted, appreciate their recommendations with ongoing goals of care discussion -Fluoxetine 80 mg daily -Consider adding on Wellbutrin in the future for mood  Hypertension Currently well controlled w/SBP 120-130s.  -Losartan 50 mg daily -Amlodipine 10 mg daily -Metoprolol 25 mg daily -Hydralazine 75 mg daily   FEN/GI -Diet: Regular -Fluids: None -MiraLAX daily -Senokot 2 tablets daily  DVT prophylaxis -Lovenox 40 mg subq injections daily  CODE STATUS: FULL  Dispo Prior to Admission Living Arrangement: Home Anticipated Discharge Location: SNF  Barriers to Discharge: Bed availability, lack of payer source  41, MD Internal Medicine, PGY-1 Pager # [phone] 05/12/19   10:53 AM

## 2019-05-13 NOTE — Plan of Care (Signed)
  Problem: Education: Goal: Knowledge of General Education information will improve Description: Including pain rating scale, medication(s)/side effects and non-pharmacologic comfort measures Outcome: Progressing   Problem: Health Behavior/Discharge Planning: Goal: Ability to manage health-related needs will improve Outcome: Progressing   Problem: Clinical Measurements: Goal: Ability to maintain clinical measurements within normal limits will improve Outcome: Progressing Goal: Will remain free from infection Outcome: Progressing Goal: Diagnostic test results will improve Outcome: Progressing   Problem: Activity: Goal: Risk for activity intolerance will decrease Outcome: Progressing   Problem: Elimination: Goal: Will not experience complications related to urinary retention Outcome: Progressing   Problem: Safety: Goal: Ability to remain free from injury will improve Outcome: Progressing   Problem: Skin Integrity: Goal: Risk for impaired skin integrity will decrease Outcome: Progressing   Problem: Education: Goal: Knowledge of disease or condition will improve Outcome: Progressing Goal: Knowledge of secondary prevention will improve Outcome: Progressing Goal: Knowledge of patient specific risk factors addressed and post discharge goals established will improve Outcome: Progressing Goal: Individualized Educational Video(s) Outcome: Progressing   Problem: Coping: Goal: Will verbalize positive feelings about self Outcome: Progressing Goal: Will identify appropriate support needs Outcome: Progressing   Problem: Health Behavior/Discharge Planning: Goal: Ability to manage health-related needs will improve Outcome: Progressing   Problem: Self-Care: Goal: Ability to participate in self-care as condition permits will improve Outcome: Progressing Goal: Verbalization of feelings and concerns over difficulty with self-care will improve Outcome: Progressing   Problem:  Ischemic Stroke/TIA Tissue Perfusion: Goal: Complications of ischemic stroke/TIA will be minimized Outcome: Progressing   

## 2019-05-13 NOTE — Progress Notes (Signed)
   Subjective: HD#145 Overnight, no acute events reported.   Pt seen at the bedside today. He is resting comfortably in bed and in no acute distress. Discussed need for physical therapy however he reports that he doesn't wish to participate with physical therapy at this time and would like to "do it his own way".   Objective:  Vital signs in last 24 hours: Vitals:   05/12/19 1537 05/12/19 1954 05/13/19 0059 05/13/19 0407  BP: 134/84 129/82 (!) 137/96 (!) 144/96  Pulse: 75 77 63 62  Resp: 16  19 19   Temp: 98.1 F (36.7 C) 98.7 F (37.1 C) 98.5 F (36.9 C) 98 F (36.7 C)  TempSrc: Oral Oral Oral Oral  SpO2: 98% 95% 96% 93%  Weight:      Height:       Physical Exam Physical Exam  Constitutional: He is oriented to person, place, and time and well-developed, well-nourished, and in no distress.  Cardiovascular: Normal rate, regular rhythm, normal heart sounds and intact distal pulses. Exam reveals no gallop and no friction rub.  No murmur heard. Pulmonary/Chest: Effort normal and breath sounds normal. No respiratory distress.  Abdominal: Soft. Bowel sounds are normal. He exhibits no distension. There is no abdominal tenderness.  Neurological: He is alert and oriented to person, place, and time.  Strength 5/5 in BUE Strength 2/5 in RLE, 5/5 in LLE  Skin: Skin is warm and dry.  Psychiatric: Affect normal.    Assessment/Plan: Mr. 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 Discussed with patient need for participation with PT/OT to facilitate SNF placement.  -Aspirin 81 mg daily  -Rosuvastatin 40 mg daily  -  Continue PT/OT  Depressed mood Improved this morning. He is interactive with normal affect.  - Palliative care consulted, appreciate their recommendations with ongoing goals of care discussion -Fluoxetine 80 mg daily -Consider adding on Wellbutrin in the future for mood  Hypertension Currently well controlled w/SBP 120-130s.  -Losartan 50 mg daily -Amlodipine 10 mg daily -Metoprolol 25 mg daily -Hydralazine 75 mg daily   FEN/GI -Diet: Regular -Fluids: None -MiraLAX daily -Senokot 2 tablets daily  DVT prophylaxis -Lovenox 40 mg subq injections daily  CODE STATUS: DNR  Dispo Prior to Admission Living Arrangement: Home Anticipated Discharge Location: SNF  Barriers to Discharge: Bed availability, lack of payer source  41, MD Internal Medicine, PGY-1 Pager # [phone] 05/13/19   6:14 AM

## 2019-05-13 NOTE — Plan of Care (Signed)
  Problem: Activity: Goal: Risk for activity intolerance will decrease Outcome: Progressing   Problem: Safety: Goal: Ability to remain free from injury will improve Outcome: Progressing   

## 2019-05-14 NOTE — Progress Notes (Addendum)
   Subjective: HD#146 Overnight, no acute events reported.   Pt seen at the bedside today. He is doing well and does not have any acute concerns at this time.   Objective:  Vital signs in last 24 hours: Vitals:   05/13/19 2126 05/13/19 2126 05/13/19 2327 05/14/19 0331  BP: (!) 132/100 (!) 132/100 (!) 117/95 (!) 135/96  Pulse: 63 63 64 61  Resp:   18 16  Temp:   98.1 F (36.7 C) 98 F (36.7 C)  TempSrc:      SpO2:  96% 95% 97%  Weight:    123 kg  Height:       Physical Exam Physical Exam  Constitutional: He is oriented to person, place, and time and well-developed, well-nourished, and in no distress.  Cardiovascular: Normal rate, regular rhythm, normal heart sounds and intact distal pulses. Exam reveals no gallop and no friction rub.  No murmur heard. Pulmonary/Chest: Effort normal and breath sounds normal. No respiratory distress.  Abdominal: Soft. Bowel sounds are normal. He exhibits no distension. There is no abdominal tenderness.  Neurological: He is alert and oriented to person, place, and time.  Skin: Skin is warm and dry.  Psychiatric: Affect normal.    Assessment/Plan: Mr. 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 Discussed with patient need for participation with PT/OT to facilitate SNF placement.  - Given that patient is bed bound, nursing to reposition to prevent decubitus ulcers  -Aspirin 81 mg daily  -Rosuvastatin 40 mg daily  - Continue PT/OT  Depressed mood He is interactive with normal affect.  - Palliative care consulted, appreciate their recommendations with ongoing goals of care  discussion -Fluoxetine 80 mg daily - Mirtazapine 7.5mg  nightly   Hypertension Currently well controlled w/SBP 120-130s.  -Metoprolol 25 mg daily -Hydralazine 75 mg daily   Hx of substance and alcohol abuse: - Thiamine 100mg  daily - Folic acid 1mg  daily   FEN/GI -Diet: Regular, Ensure + Prostat -Fluids: None -MiraLAX daily -Senokot 2 tablets daily  DVT prophylaxis -Lovenox 40 mg subq injections daily  CODE STATUS: DNR  Dispo Prior to Admission Living Arrangement: Home Anticipated Discharge Location: SNF  Barriers to Discharge: Bed availability, lack of payer source  , MD Internal Medicine, PGY-1 Pager # [phone] 05/14/19   6:54 AM

## 2019-05-15 NOTE — Progress Notes (Signed)
   Subjective: HD#146 Overnight, no acute events reported.   Patient seen and evaluated at bedside. No new complaints today.  He reports that he tried to on the bed yesterday, states well.  Still if you would be interested in getting PT again, patient declined  Objective:  Vital signs in last 24 hours: Vitals:   05/14/19 1728 05/14/19 2010 05/14/19 2332 05/15/19 0341  BP: 131/77 (!) 142/98 (!) 143/94 135/90  Pulse: 75 66 64 (!) 58  Resp: 17 19 18 17   Temp: (!) 97.5 F (36.4 C) 98 F (36.7 C) 97.9 F (36.6 C) (!) 97.4 F (36.3 C)  TempSrc: Oral Oral Oral Oral  SpO2: 98% 97% 95% 97%  Weight:      Height:       Physical Exam Physical Exam  Constitutional: He is oriented to person, place, and time and well-developed, well-nourished, and in no distress.  Cardiovascular: Normal rate, regular rhythm, normal heart sounds and intact distal pulses. Exam reveals no gallop and no friction rub.  No murmur heard. Pulmonary/Chest: Effort normal and breath sounds normal. No respiratory distress.  Abdominal: Soft. Bowel sounds are normal. He exhibits no distension. There is no abdominal tenderness.  Neurological: He is alert and oriented to person, place, and time.  Right sided 2/4 strength in upper and lower extremities  Skin: Skin is warm and dry.  Psychiatric: Affect normal.    Assessment/Plan: Mr. 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 Discussed with patient need for participation with PT/OT to facilitate SNF placement.  - Given that patient is bed bound, nursing to reposition to prevent decubitus ulcers  - Continue  Aspirin 81 mg daily  -Continue Rosuvastatin 40 mg daily  - Continue PT/OT as patient allows  Depressed mood He is interactive with normal affect.  - Palliative care consulted, appreciate their recommendations with ongoing goals of care discussion -Fluoxetine 80 mg daily - Mirtazapine 7.5mg  nightly   Hypertension Currently well controlled w/SBP 120-130s.  -Metoprolol 25 mg daily -Hydralazine 75 mg daily   Hx of substance and alcohol abuse: - Thiamine 100mg  daily - Folic acid 1mg  daily   FEN/GI -Diet: Regular, Ensure + Prostat -Fluids: None -MiraLAX daily -Senokot 2 tablets daily  DVT prophylaxis -Lovenox 40 mg subq injections daily  CODE STATUS: DNR  Dispo Prior to Admission Living Arrangement: Home Anticipated Discharge Location: SNF  Barriers to Discharge: Bed availability, lack of payer source  41, M.D. PGY2 Pager [phone] 05/15/2019 6:51 AM

## 2019-05-15 NOTE — Plan of Care (Signed)
  Problem: Education: Goal: Knowledge of General Education information will improve Description: Including pain rating scale, medication(s)/side effects and non-pharmacologic comfort measures Outcome: Progressing   Problem: Health Behavior/Discharge Planning: Goal: Ability to manage health-related needs will improve Outcome: Progressing   Problem: Clinical Measurements: Goal: Ability to maintain clinical measurements within normal limits will improve Outcome: Progressing Goal: Will remain free from infection Outcome: Progressing Goal: Diagnostic test results will improve Outcome: Progressing   Problem: Activity: Goal: Risk for activity intolerance will decrease Outcome: Progressing   Problem: Elimination: Goal: Will not experience complications related to urinary retention Outcome: Progressing   Problem: Safety: Goal: Ability to remain free from injury will improve Outcome: Progressing   Problem: Skin Integrity: Goal: Risk for impaired skin integrity will decrease Outcome: Progressing   Problem: Education: Goal: Knowledge of disease or condition will improve Outcome: Progressing Goal: Knowledge of secondary prevention will improve Outcome: Progressing Goal: Knowledge of patient specific risk factors addressed and post discharge goals established will improve Outcome: Progressing Goal: Individualized Educational Video(s) Outcome: Progressing   Problem: Coping: Goal: Will verbalize positive feelings about self Outcome: Progressing Goal: Will identify appropriate support needs Outcome: Progressing   Problem: Health Behavior/Discharge Planning: Goal: Ability to manage health-related needs will improve Outcome: Progressing   Problem: Self-Care: Goal: Ability to participate in self-care as condition permits will improve Outcome: Progressing Goal: Verbalization of feelings and concerns over difficulty with self-care will improve Outcome: Progressing   Problem:  Ischemic Stroke/TIA Tissue Perfusion: Goal: Complications of ischemic stroke/TIA will be minimized Outcome: Progressing   

## 2019-05-16 NOTE — Progress Notes (Signed)
   Subjective: HD#148 Overnight, no acute events reported.   Patient seen and evaluated at bedside. He is resting comfortably and watching TV. He is eating breakfast. No acute concerns this morning. Continuing to decline physical therapy   Objective:  Vital signs in last 24 hours: Vitals:   05/15/19 1948 05/15/19 2203 05/15/19 2329 05/16/19 0338  BP: (!) 141/95 137/90 140/85 (!) 139/95  Pulse: 69 66 62 60  Resp: 15  17 18   Temp: 98.2 F (36.8 C)  97.9 F (36.6 C) 98.1 F (36.7 C)  TempSrc: Oral  Oral Oral  SpO2: 97% 97% 96% 94%  Weight:      Height:       Physical Exam Physical Exam  Constitutional: He is oriented to person, place, and time and well-developed, well-nourished, and in no distress.  Cardiovascular: Normal rate, regular rhythm, normal heart sounds and intact distal pulses. Exam reveals no gallop and no friction rub.  No murmur heard. Pulmonary/Chest: Effort normal and breath sounds normal. No respiratory distress.  Abdominal: Soft. Bowel sounds are normal. He exhibits no distension. There is no abdominal tenderness.  Neurological: He is alert and oriented to person, place, and time.  Right sided 2/4 strength in upper and lower extremities  Skin: Skin is warm and dry.  Psychiatric: Affect normal.    Assessment/Plan: Mr. 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 Discussed with patient need for participation with PT/OT to facilitate SNF placement. He continues to decline participating with physical therapy.  - Given that patient is bed bound, nursing to reposition to prevent decubitus ulcers  -  Continue Aspirin 81 mg daily  -Continue Rosuvastatin 40 mg daily  - Continue PT/OT as patient allows  Depressed mood He is interactive with normal affect.  - Palliative care consulted, appreciate their recommendations with ongoing goals of care discussion -Fluoxetine 80 mg daily - Mirtazapine 7.5mg  nightly   Hypertension Currently well controlled w/SBP 120-130s.  -Metoprolol 25 mg daily -Hydralazine 75 mg daily   Hx of substance and alcohol abuse: - Thiamine 100mg  daily - Folic acid 1mg  daily   FEN/GI -Diet: Regular, Ensure + Prostat -Fluids: None -MiraLAX daily -Senokot 2 tablets daily  DVT prophylaxis -Lovenox 40 mg subq injections daily  CODE STATUS: DNR  Dispo Prior to Admission Living Arrangement: Home Anticipated Discharge Location: SNF  Barriers to Discharge: Bed availability, lack of payer source  41, MD Internal Medicine, PGY-1 Pager # [phone] 05/16/19   11:45 AM

## 2019-05-17 MED ORDER — ENOXAPARIN SODIUM 60 MG/0.6ML ~~LOC~~ SOLN
0.5000 mg/kg | SUBCUTANEOUS | Status: DC
  Administered 2019-05-17 – 2019-06-13 (×27): 60 mg via SUBCUTANEOUS
  Filled 2019-05-17 (×27): qty 0.6

## 2019-05-17 NOTE — Progress Notes (Signed)
   Subjective: HD#149 Overnight, no acute events reported.   Patient seen and evaluated at bedside. He is doing well this morning and in no acute distress. He continues to decline physical therapy and does not wish to get up and sit in the chair. Discussed risk of ulcer formation. Patient expresses understanding.   Objective:  Vital signs in last 24 hours: Vitals:   05/16/19 1948 05/16/19 2318 05/16/19 2321 05/17/19 0316  BP: (!) 154/89 (!) 142/87 (!) 142/87 (!) 128/92  Pulse: 71 64 65 (!) 57  Resp: 18 18 18 17   Temp: 98.6 F (37 C) (!) 97.5 F (36.4 C) (!) 97.5 F (36.4 C) 97.8 F (36.6 C)  TempSrc: Oral Oral Oral Oral  SpO2: 96% 97% 95% 94%  Weight:      Height:       Physical Exam Physical Exam  Constitutional: He is oriented to person, place, and time and well-developed, well-nourished, and in no distress.  Cardiovascular: Normal rate, regular rhythm, normal heart sounds and intact distal pulses. Exam reveals no gallop and no friction rub.  No murmur heard. Pulmonary/Chest: Effort normal and breath sounds normal. No respiratory distress.  Abdominal: Soft. Bowel sounds are normal. He exhibits no distension. There is no abdominal tenderness.  Neurological: He is alert and oriented to person, place, and time.  Right sided 2/4 strength in upper and lower extremities  Skin: Skin is warm and dry.  Psychiatric: Affect normal.    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 Discussed with patient need for participation with PT/OT to facilitate SNF placement. He continues to decline participating  with physical therapy.  - Given that patient is bed bound, nursing to reposition to prevent decubitus ulcers  - Continue Aspirin 81 mg daily  -Continue Rosuvastatin 40 mg daily  - Continue PT/OT as patient allows  Depressed mood He is interactive with normal affect.  - Palliative care consulted, appreciate their recommendations with ongoing goals of care discussion -Fluoxetine 80 mg daily - Mirtazapine 7.5mg  nightly   Hypertension Currently well controlled w/SBP 120-130s.  -Metoprolol 25 mg daily -Hydralazine 75 mg daily   Hx of substance and alcohol abuse: - Thiamine 100mg  daily - Folic acid 1mg  daily   FEN/GI -Diet: Regular, Ensure + Prostat -Fluids: None -MiraLAX daily -Senokot 2 tablets daily  DVT prophylaxis -Lovenox 40 mg subq injections daily  CODE STATUS: DNR  Dispo Prior to Admission Living Arrangement: Home Anticipated Discharge Location: SNF  Barriers to Discharge: Bed availability, lack of payer source  41, MD Internal Medicine, PGY-1 Pager # [phone] 05/17/19   6:26 AM

## 2019-05-18 DIAGNOSIS — M6281 Muscle weakness (generalized): Secondary | ICD-10-CM

## 2019-05-18 NOTE — Progress Notes (Signed)
Patient ID: Philip Vasquez, male   DOB: November 05, 1960, 59 y.o.   MRN: [id number]  This NP visited patient at the bedside as a follow up for palliative medicine needs and emotional support.  On admission, toxicology was positive for cocaine,and  head MRI on 12/19/2018 significant for: IMPRESSION: 1. Widely scattered small acute infarcts in the medial left hemisphere involving left ACA and left ACA/MCA watershed area. No associated hemorrhage or mass effect. 2. 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   He is alert and oriented to person and place and is able to tell me that he has had a stroke.  Philip Vasquez is appropriate but limited.  He continues to decline physical therapy.  Today is day 150 of this hospitalization.   He is stable and awaits bed placement; insurance and nursing care needs are the hurdles.  Palliative will sign of at this time, please re-consult if needs in the future   Discussed with CMRN/ Tresa Endo and LCSW/Philip Vasquez  Total time spent on the unit was 15  minutes  Greater than 50% of the time was spent in counseling and coordination of care  Lorinda Creed NP  Palliative Medicine Team Team Phone # [phone] Pager [phone]

## 2019-05-18 NOTE — Progress Notes (Signed)
   Subjective: HD#150 Overnight, no acute events reported.   Patient seen and evaluated at bedside this morning. He is resting comfortably in bed and watching tv. No acute concerns at this time.   Objective:  Vital signs in last 24 hours: Vitals:   05/17/19 1603 05/17/19 2002 05/17/19 2342 05/18/19 0356  BP: 128/81 130/88 (!) 122/94 (!) 124/93  Pulse: 72 76 64 63  Resp: 19 19 18 19   Temp: 97.8 F (36.6 C) 98.2 F (36.8 C) 98.1 F (36.7 C) 98.2 F (36.8 C)  TempSrc: Oral Oral Oral Oral  SpO2: 100% 94% 94% 98%  Weight:      Height:       Physical Exam Physical Exam  Constitutional: He is oriented to person, place, and time and well-developed, well-nourished, and in no distress.  Cardiovascular: Normal rate, regular rhythm, normal heart sounds and intact distal pulses. Exam reveals no gallop and no friction rub.  No murmur heard. Pulmonary/Chest: Effort normal and breath sounds normal. No respiratory distress.  Abdominal: Soft. Bowel sounds are normal. He exhibits no distension. There is no abdominal tenderness.  Neurological: He is alert and oriented to person, place, and time.  Right sided 2/4 strength in upper and lower extremities  Skin: Skin is warm and dry.  Psychiatric: Affect normal.    Assessment/Plan: Mr. 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 Discussed with patient need for participation with PT/OT to facilitate SNF placement. He continues to decline participating with physical therapy.  - Given that patient is bed bound, nursing to reposition to prevent decubitus ulcers  - Continue Aspirin 81 mg  daily  -Continue Rosuvastatin 40 mg daily  - Continue PT/OT as patient allows  Depressed mood He is interactive with normal affect.  - Palliative care consulted, appreciate their recommendations with ongoing goals of care discussion -Fluoxetine 80 mg daily - Mirtazapine 7.5mg  nightly   Hypertension Currently well controlled w/SBP 120-130s.  -Metoprolol 25 mg daily -Hydralazine 75 mg daily   Hx of substance and alcohol abuse: - Thiamine 100mg  daily - Folic acid 1mg  daily   FEN/GI -Diet: Regular, Ensure + Prostat -Fluids: None -MiraLAX daily -Senokot 2 tablets daily  DVT prophylaxis -Lovenox 40 mg subq injections daily  CODE STATUS: DNR  Dispo Prior to Admission Living Arrangement: Home Anticipated Discharge Location: SNF  Barriers to Discharge: Bed availability, lack of payer source  41, MD Internal Medicine, PGY-1 Pager # [phone] 05/18/19   6:18 AM

## 2019-05-19 NOTE — Progress Notes (Signed)
   Subjective: HD#151 Overnight, no acute events reported.   Patient seen and evaluated at bedside this morning. He is resting comfortably in bed and is in good spirits. Discussed ongoing need to get out of bed and move. Patient expresses some willingness to work with physical therapy, but not today.   Objective:  Vital signs in last 24 hours: Vitals:   05/18/19 1521 05/18/19 2023 05/18/19 2330 05/19/19 0330  BP: (!) 123/95 126/87 122/78 105/82  Pulse: 65 72 61 62  Resp: 19 17 19 18   Temp: 98.3 F (36.8 C) 97.8 F (36.6 C) 97.7 F (36.5 C) 98.1 F (36.7 C)  TempSrc: Oral Oral Oral Oral  SpO2: 97% 97% 97% 97%  Weight:      Height:       Physical Exam Physical Exam  Constitutional: He is oriented to person, place, and time and well-developed, well-nourished, and in no distress.  Cardiovascular: Normal rate, regular rhythm, normal heart sounds and intact distal pulses. Exam reveals no gallop and no friction rub.  No murmur heard. Pulmonary/Chest: Effort normal and breath sounds normal. No respiratory distress.  Abdominal: Soft. Bowel sounds are normal. He exhibits no distension. There is no abdominal tenderness.  Neurological: He is alert and oriented to person, place, and time.  Right sided 2/4 strength in upper and lower extremities  Skin: Skin is warm and dry.  Psychiatric: Affect normal.    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 Discussed with patient need for participation with PT/OT to regain strength. He is more open to the idea today. Will continue to monitor and encourage  participation with PT/OT.  - Given that patient is bed bound, nursing to reposition to prevent decubitus ulcers  - Continue Aspirin 81 mg daily  -Continue Rosuvastatin 40 mg daily   Depressed mood He is interactive with normal affect.  -Fluoxetine 80 mg daily - Mirtazapine 7.5mg  nightly   Hypertension Currently well controlled. -Metoprolol 25 mg daily -Hydralazine 75 mg daily   Hx of substance and alcohol abuse: - Thiamine 100mg  daily - Folic acid 1mg  daily   FEN/GI -Diet: Regular, Ensure + Prostat -Fluids: None -MiraLAX daily -Senokot 2 tablets daily  DVT prophylaxis -Lovenox 40 mg subq injections daily  CODE STATUS: DNR  Dispo Prior to Admission Living Arrangement: Home Anticipated Discharge Location: SNF  Barriers to Discharge: Bed availability, lack of payer source  41, MD Internal Medicine, PGY-1 Pager # [phone] 05/19/19   6:31 AM

## 2019-05-20 NOTE — Progress Notes (Signed)
   Subjective: HD#152 Overnight, no acute events reported.   Patient seen and evaluated at bedside this morning.He is resting comfortably in bed and no acute distress. This morning, patient continuing to decline participating with PT/OT.   Objective:  Vital signs in last 24 hours: Vitals:   05/19/19 1042 05/19/19 1709 05/19/19 2005 05/20/19 0349  BP: (!) 131/92 (!) 146/94 128/86 (!) 143/99  Pulse: 69 68 77 66  Resp: 18 16 18 18   Temp: 98.3 F (36.8 C) 98.2 F (36.8 C) 97.6 F (36.4 C) 98.2 F (36.8 C)  TempSrc: Oral Oral  Oral  SpO2: 97% 97% 96% 96%  Weight:      Height:       Physical Exam Physical Exam  Constitutional: He is oriented to person, place, and time and well-developed, well-nourished, and in no distress.  Cardiovascular: Normal rate, regular rhythm, normal heart sounds and intact distal pulses. Exam reveals no gallop and no friction rub.  No murmur heard. Pulmonary/Chest: Effort normal and breath sounds normal. No respiratory distress.  Abdominal: Soft. Bowel sounds are normal. He exhibits no distension. There is no abdominal tenderness.  Neurological: He is alert and oriented to person, place, and time.  Right sided 2/4 strength in upper and lower extremities  Skin: Skin is warm and dry.  Psychiatric: Affect normal.    Assessment/Plan: Mr. 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 Discussed with patient need for participation with PT/OT to regain strength and SNF placement. He continues to decline PT/OT. Will continue to monitor and encourage PT/OT.   - Given that patient is bed bound, nursing to reposition to prevent decubitus ulcers    - Continue Aspirin 81 mg daily  -Continue Rosuvastatin 40 mg daily   Depressed mood He is interactive with normal affect.  -Fluoxetine 80 mg daily - Mirtazapine 7.5mg  nightly   Hypertension Currently well controlled. -Metoprolol 25 mg daily -Hydralazine 75 mg daily   Hx of substance and alcohol abuse: - Thiamine 100mg  daily - Folic acid 1mg  daily   FEN/GI -Diet: Regular, Ensure + Prostat -Fluids: None -MiraLAX daily -Senokot 2 tablets daily  DVT prophylaxis -Lovenox 40 mg subq injections daily  CODE STATUS: DNR  Dispo Prior to Admission Living Arrangement: Home Anticipated Discharge Location: SNF  Barriers to Discharge: Bed availability, lack of payer source  41, MD Internal Medicine, PGY-1 Pager # [phone] 05/20/19   6:26 AM

## 2019-05-21 LAB — BASIC METABOLIC PANEL
Anion gap: 9 (ref 5–15)
BUN: 26 mg/dL — ABNORMAL HIGH (ref 6–20)
CO2: 25 mmol/L (ref 22–32)
Calcium: 9.5 mg/dL (ref 8.9–10.3)
Chloride: 106 mmol/L (ref 98–111)
Creatinine, Ser: 1.72 mg/dL — ABNORMAL HIGH (ref 0.61–1.24)
GFR calc Af Amer: 50 mL/min — ABNORMAL LOW (ref >60)
GFR calc non Af Amer: 43 mL/min — ABNORMAL LOW (ref >60)
Glucose, Bld: 106 mg/dL — ABNORMAL HIGH (ref 70–99)
Potassium: 4.5 mmol/L (ref 3.5–5.1)
Sodium: 140 mmol/L (ref 135–145)

## 2019-05-21 LAB — CBC
HCT: 40.2 % (ref 39.0–52.0)
Hemoglobin: 12.6 g/dL — ABNORMAL LOW (ref 13.0–17.0)
MCH: 26.9 pg (ref 26.0–34.0)
MCHC: 31.3 g/dL (ref 30.0–36.0)
MCV: 85.7 fL (ref 80.0–100.0)
Platelets: 242 K/uL (ref 150–400)
RBC: 4.69 MIL/uL (ref 4.22–5.81)
RDW: 15.3 % (ref 11.5–15.5)
WBC: 5.7 K/uL (ref 4.0–10.5)
nRBC: 0 % (ref 0.0–0.2)

## 2019-05-21 NOTE — Progress Notes (Signed)
   Subjective: HD#153 Overnight, no acute events reported.   Patient seen and evaluated at bedside. He is resting comfortably and does not have any acute concerns today. Discussed continued need for physical therapy, declined.  Objective:  Vital signs in last 24 hours: Vitals:   05/20/19 1205 05/20/19 1608 05/20/19 2033 05/21/19 0336  BP: (!) 134/91 118/89 131/87 133/89  Pulse: 73 70 71 66  Resp: 18 16 16 18   Temp: 98.7 F (37.1 C) 98.2 F (36.8 C) 98.1 F (36.7 C) 98 F (36.7 C)  TempSrc: Oral Oral Oral Oral  SpO2:  100% 95% 99%  Weight:      Height:       Physical Exam Physical Exam  Constitutional: He is oriented to person, place, and time and well-developed, well-nourished, and in no distress.  Cardiovascular: Normal rate, regular rhythm, normal heart sounds and intact distal pulses. Exam reveals no gallop and no friction rub.  No murmur heard. Pulmonary/Chest: Effort normal and breath sounds normal. No respiratory distress.  Abdominal: Soft. Bowel sounds are normal. He exhibits no distension. There is no abdominal tenderness.  Neurological: He is alert and oriented to person, place, and time.  Right sided 2/4 strength in upper and lower extremities  Skin: Skin is warm and dry.  Psychiatric: Affect normal.    Assessment/Plan: Mr. 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 Ongoing discussions regarding need for PT/OT; however, continues to decline PT/OT. Will continue to monitor and encourage. - Given that patient is bed bound, nursing to reposition to prevent decubitus ulcers  - Continue Aspirin 81 mg daily  -Continue Rosuvastatin 40 mg daily    Depressed mood He is interactive with normal affect.  -Fluoxetine 80 mg daily - Mirtazapine 7.5mg  nightly   Hypertension Currently well controlled. -Metoprolol 25 mg daily -Hydralazine 75 mg daily   Hx of substance and alcohol abuse: - Thiamine 100mg  daily - Folic acid 1mg  daily   FEN/GI -Diet: Regular, Ensure + Prostat -Fluids: None -MiraLAX daily -Senokot 2 tablets daily  DVT prophylaxis -Lovenox 40 mg subq injections daily  CODE STATUS: DNR  Dispo Prior to Admission Living Arrangement: Home Anticipated Discharge Location: SNF  Barriers to Discharge: Bed availability, lack of payer source  41, MD Internal Medicine, PGY-1 Pager # [phone] 05/21/19   6:30 AM

## 2019-05-21 NOTE — TOC Progression Note (Signed)
Transition of Care [facility]) - Progression Note    Patient Details  Name: Philip Vasquez MRN: [id number] Date of Birth: [birth year]  [facility] of [facility]) CM/SW Contact  Kermit Balo, RN Phone Number: 05/21/2019, 3:07 PM  Clinical Narrative:    Continues not to have any bed offers for SNF rehab. TOC following.   Expected Discharge Plan: Skilled Nursing Facility Barriers to Discharge: SNF Pending payor source - LOG, SNF Pending bed offer, Inadequate or no insurance, Active Substance Use - Placement  Expected Discharge Plan and Services Expected Discharge Plan: Skilled Nursing Facility In-house Referral: Clinical Social Work Discharge Planning Services: CM Consult                                           Social Determinants of Health (SDOH) Interventions    Readmission Risk Interventions No flowsheet data found.

## 2019-05-22 NOTE — Progress Notes (Signed)
   Subjective: Patient reports feeling well today, no acute events overnight.  He denies any fevers, chills, chest pain, shortness of breath, or other symptoms overnight.  He has not been up and out of bed recently.  Discussed the importance of restarting physical therapy however patient declined.   Objective:  Vital signs in last 24 hours: Vitals:   05/21/19 1056 05/21/19 1533 05/21/19 2047 05/22/19 0357  BP: 117/86 128/87 126/88 122/87  Pulse:  70 73 71  Resp:  20 16 18   Temp:  98.1 F (36.7 C) 98.2 F (36.8 C) 98.6 F (37 C)  TempSrc:   Oral Oral  SpO2:  96% 94% 95%  Weight:      Height:       General: Middle-aged male, no acute distress, lying in bed Cardiac: Regular rate and rhythm, no murmurs rubs or gallops Pulmonary: CTA BL, no wheezing, rhonchi, rales Neuro: Alert and oriented x3, right side 2/5 strength in upper and lower extremities  Assessment/Plan:  Principal Problem:   Cerebral thrombosis with cerebral infarction Active Problems:   Right sided weakness   Anemia of chronic disease   Urinary tract infection   Palliative care by specialist   Goals of care, counseling/discussion   Depression, recurrent (HCC)   Concerned about having social problem   Muscular weakness   DNR (do not resuscitate)  This is a 59 year old male with a history of substance abuse who was admitted for left ACA/MCA watershed infarct resulting in right sided upper and lower extremity deficits.  Patient is currently medically stable for discharge, awaiting SNF placement, patient is uninsured.   L ACA/MCA watershed infarct w/ R sided deficits   He continues to decline working with PT/OT, discussed the importance of restarting this to decrease muscle strength.  Attempted to encourage patient today.  We will continue to monitor and continue discussion. - Given that patient is bed bound, nursing to reposition to prevent decubitus ulcers  - Continue Aspirin 81 mg daily  -Continue Rosuvastatin 40  mg daily   AoCKD: Creatinine increased to 1.72 from 1.47 about 2 weeks ago.  Patient reports good appetite. Encouraged oral fluid intake, will repeat BMP in 1-2 days.   Depressed mood Stable -Fluoxetine 80 mg daily - Mirtazapine 7.5mg  nightly   Hypertension Currently well controlled. -Metoprolol 25 mg daily -Hydralazine 75 mg daily   Hx of substance and alcohol abuse: - Thiamine 100mg  daily - Folic acid 1mg  daily   FEN/GI -Diet: Regular, Ensure + Prostat -Fluids: None -MiraLAX daily -Senokot 2 tablets daily  DVT prophylaxis -Lovenox 40 mg subq injections daily  CODE STATUS: DNR  Dispo Prior to Admission Living Arrangement: Home Anticipated Discharge Location: SNF  Barriers to Discharge: Bed availability, lack of payer source   41, MD 05/22/2019, 6:51 AM Pager: [phone]

## 2019-05-23 DIAGNOSIS — F1911 Other psychoactive substance abuse, in remission: Secondary | ICD-10-CM

## 2019-05-23 DIAGNOSIS — F1011 Alcohol abuse, in remission: Secondary | ICD-10-CM

## 2019-05-23 NOTE — Progress Notes (Signed)
   Subjective: Patient reports feeling well today, no acute events overnight.  Denies any complaints today.  We discussed restarting physical therapy however patient would not like to do this at this time.  Reports that he is working on his strength himself.  Continue to encourage restarting therapy.  Objective:  Vital signs in last 24 hours: Vitals:   05/22/19 1335 05/22/19 1546 05/22/19 2108 05/23/19 0339  BP: 136/90 134/85 133/81 121/79  Pulse: 68 71 67 62  Resp: 20 18 16 16   Temp: 98.4 F (36.9 C) 98.6 F (37 C) 98.1 F (36.7 C) 98.9 F (37.2 C)  TempSrc: Oral Oral Oral Oral  SpO2: 98% 96% 97% 96%  Weight:      Height:       General: Middle-aged male, no acute distress, lying in bed Cardiac: Regular rate and rhythm, no murmurs rubs or gallops Pulmonary: CTA BL, no wheezing, rhonchi, rales Neuro: Alert and oriented x3, right side 2/5 strength in upper and lower extremities  Assessment/Plan:  Principal Problem:   Cerebral thrombosis with cerebral infarction Active Problems:   Right sided weakness   Anemia of chronic disease   Urinary tract infection   Palliative care by specialist   Goals of care, counseling/discussion   Depression, recurrent (HCC)   Concerned about having social problem   Muscular weakness   DNR (do not resuscitate)  This is a 59 year old male with a history of substance abuse who was admitted for left ACA/MCA watershed infarct resulting in right sided upper and lower extremity deficits.  Patient is currently medically stable for discharge, awaiting SNF placement, patient is uninsured.   L ACA/MCA watershed infarct w/ R sided deficits   Patient continues to decline working with physical therapy and not relational therapy, we will continue to encourage restarting this. We will continue to monitor and continue discussion. -Given that patient is bed bound, nursing to reposition to prevent decubitus ulcers  -Continue Aspirin 81 mg daily  -Continue  Rosuvastatin 40 mg daily   AoCKD: Creatinine increased to 1.72 yesterday from 1.47 about 2 weeks ago.  Patient reports good appetite. Continue to encourage oral fluid intake, repeat BMP in a few days   Depressed mood Stable -Fluoxetine 80 mg daily - Mirtazapine 7.5mg  nightly   Hypertension Currently well controlled. -Metoprolol 25 mg daily -Hydralazine 75 mg daily   Hx of substance and alcohol abuse: - Thiamine 100mg  daily - Folic acid 1mg  daily   FEN/GI -Diet: Regular, Ensure + Prostat -Fluids: None -MiraLAX daily -Senokot 2 tablets daily  DVT prophylaxis -Lovenox 40 mg subq injections daily  CODE STATUS: DNR  Dispo Prior to Admission Living Arrangement: Home Anticipated Discharge Location: SNF  Barriers to Discharge: Bed availability, lack of payer source  41, MD 05/23/2019, 6:31 AM Pager: [phone]

## 2019-05-24 NOTE — Progress Notes (Signed)
   Subjective: HD#156 Overnight, no acute events reported.   Patient seen and evaluated at bedside. He is resting comfortably, NAD. States he had a good weekend. Continues to defer working with physical therapy.   Objective:  Vital signs in last 24 hours: Vitals:   05/23/19 1830 05/23/19 2112 05/23/19 2337 05/24/19 0414  BP: (!) 138/99 (!) 133/92 137/90 131/82  Pulse: 69 74 72 63  Resp: 17 18 19 19   Temp: 97.7 F (36.5 C) 98 F (36.7 C) 98.6 F (37 C) 97.9 F (36.6 C)  TempSrc: Oral Oral Oral Oral  SpO2: 97% 98% 96% 97%  Weight:      Height:       Physical Exam Physical Exam  Constitutional: He is oriented to person, place, and time and well-developed, well-nourished, and in no distress.  Cardiovascular: Normal rate, regular rhythm, normal heart sounds and intact distal pulses. Exam reveals no gallop and no friction rub.  No murmur heard. Pulmonary/Chest: Effort normal and breath sounds normal. No respiratory distress.  Abdominal: Soft. Bowel sounds are normal. He exhibits no distension. There is no abdominal tenderness.  Neurological: He is alert and oriented to person, place, and time.  Right sided 2/4 strength in upper and lower extremities  Skin: Skin is warm and dry.  Psychiatric: Affect normal.    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  L ACA/MCA watershed infarct w/ R sided deficits   Patient continues to decline working with physical and occupational therapy. Will continue to monitor and encourage. - Given that patient is bed bound, nursing to reposition to prevent decubitus ulcers  - Continue Aspirin 81 mg daily  -Continue Rosuvastatin 40 mg daily   Acute on chronic kidney disease:  sCr 1.72 from 1.47 in 2  weeks. Patient has good oral intake.   - BMP in AM  Depressed mood He is interactive with normal affect.  -Fluoxetine 80 mg daily - Mirtazapine 7.5mg  nightly   Hypertension Currently well controlled. -Metoprolol 25 mg daily -Hydralazine 75 mg daily   Hx of substance and alcohol abuse: - Thiamine 100mg  daily - Folic acid 1mg  daily   FEN/GI -Diet: Regular, Ensure + Prostat -Fluids: None -MiraLAX daily -Senokot 2 tablets daily  DVT prophylaxis: Lovenox 40 mg daily  CODE STATUS: DNR  Dispo Prior to Admission Living Arrangement: Home Anticipated Discharge Location: SNF  Barriers to Discharge: Bed availability, lack of payer source  41, MD Internal Medicine, PGY-1 Pager # [phone] 05/24/19   6:10 AM

## 2019-05-25 NOTE — Progress Notes (Addendum)
   Subjective: HD#157 Overnight, no acute events reported.   Patient seen and evaluated at bedside. He is resting comfortably in bed and in no acute distress. He notes that he wants to continue to improve strength on his own and declining physical therapy.   Objective:  Vital signs in last 24 hours: Vitals:   05/24/19 1615 05/24/19 2020 05/25/19 0051 05/25/19 0420  BP: (!) 130/93 134/84 118/87 131/87  Pulse: 71 79 65 65  Resp: 20 18 18 18   Temp: 98 F (36.7 C) 98 F (36.7 C) 98.5 F (36.9 C) 98.3 F (36.8 C)  TempSrc: Oral Oral Oral   SpO2: 95% 95% 95% 99%  Weight:      Height:       Physical Exam Physical Exam  Constitutional: He is oriented to person, place, and time and well-developed, well-nourished, and in no distress.  Cardiovascular: Normal rate, regular rhythm, normal heart sounds and intact distal pulses. Exam reveals no gallop and no friction rub.  No murmur heard. Pulmonary/Chest: Effort normal and breath sounds normal. No respiratory distress.  Abdominal: Soft. Bowel sounds are normal. He exhibits no distension. There is no abdominal tenderness.  Neurological: He is alert and oriented to person, place, and time.  Right sided 2/4 strength in upper and lower extremities  Skin: Skin is warm and dry.  Psychiatric: Affect normal.    Assessment/Plan: Mr. Pam is a 59 year old male with PMHx of substance use who was admitted forL ACA/MCA watershed infarctresulting in right-sided upper and lower extremity deficits.He is currently medically stable for discharge, however due to his uninsured status awaiting SNF placement.  L ACA/MCA watershed infarct w/ R sided deficits   Patient continues to decline working with physical and occupational therapy. Will continue to monitor and encourage. - Given that patient is bed bound, nursing to reposition to prevent decubitus ulcers  - Continue Aspirin 81 mg daily  -Continue Rosuvastatin 40 mg daily   Acute on chronic kidney  disease:  sCr 1.72 from 1.47 in 2 weeks. Patient has good oral intake. BMP not collected this morning.  - BMP in AM  Depressed mood He is interactive with normal affect.  -Fluoxetine 80 mg daily - Mirtazapine 7.5mg  nightly   Hypertension Currently well controlled. -Metoprolol 25 mg daily -Hydralazine 75 mg daily   Hx of substance and alcohol abuse: - Thiamine 100mg  daily - Folic acid 1mg  daily   FEN/GI -Diet: Regular, Ensure + Prostat -Fluids: None -MiraLAX daily -Senokot 2 tablets daily  DVT prophylaxis: Lovenox 40 mg daily  CODE STATUS: DNR  Dispo Prior to Admission Living Arrangement: Home Anticipated Discharge Location: SNF  Barriers to Discharge: Bed availability, lack of payer source  41, MD Internal Medicine, PGY-1 Pager # [phone] 05/25/19   6:43 AM

## 2019-05-26 NOTE — Progress Notes (Signed)
   Subjective: HD#158 Overnight, no acute events reported.   Patient seen and evaluated at bedside. He is resting comfortably in bed and in no acute distress. Patient continues to decline physical therapy despite ongoing discussion and encouragement.   Objective:  Vital signs in last 24 hours: Vitals:   05/25/19 2330 05/26/19 0408 05/26/19 0759 05/26/19 1141  BP: (!) 139/92 (!) 134/91 (!) 152/97 130/86  Pulse: 60 61 64 67  Resp: 18 18 18 18   Temp: 98 F (36.7 C) 97.8 F (36.6 C) 98.2 F (36.8 C) 98.7 F (37.1 C)  TempSrc: Oral Oral Oral Oral  SpO2: 98% 96% 94% 96%  Weight:      Height:       Physical Exam Physical Exam  Constitutional: He is oriented to person, place, and time and well-developed, well-nourished, and in no distress.  Cardiovascular: Normal rate, regular rhythm, normal heart sounds and intact distal pulses. Exam reveals no gallop and no friction rub.  No murmur heard. Pulmonary/Chest: Effort normal and breath sounds normal. No respiratory distress.  Abdominal: Soft. Bowel sounds are normal. He exhibits no distension. There is no abdominal tenderness.  Neurological: He is alert and oriented to person, place, and time.  Right sided 2/4 strength in upper and lower extremities  Skin: Skin is warm and dry.  Psychiatric: Affect normal.    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  L ACA/MCA watershed infarct w/ R sided deficits   Patient continues to decline working with physical and occupational therapy. Will continue to monitor and encourage patient to participate with physical therapy.  - Given that patient is bed bound, nursing to reposition to prevent decubitus ulcers  - Continue Aspirin 81 mg daily  -Continue  Rosuvastatin 40 mg daily   Acute on chronic kidney disease:  sCr 1.72 from 1.47 in 2 weeks. Patient has good oral intake. BMP not collected this morning.  - BMP pending  Depressed mood He is interactive with normal affect.  -Fluoxetine 80 mg daily - Mirtazapine 7.5mg  nightly   Hypertension Currently well controlled. -Metoprolol 25 mg daily -Hydralazine 75 mg daily   Hx of substance and alcohol abuse: - Thiamine 100mg  daily - Folic acid 1mg  daily   FEN/GI -Diet: Regular, Ensure + Prostat -Fluids: None -MiraLAX daily -Senokot 2 tablets daily  DVT prophylaxis: Lovenox 40 mg daily  CODE STATUS: DNR  Dispo Prior to Admission Living Arrangement: Home Anticipated Discharge Location: SNF  Barriers to Discharge: Bed availability, lack of payer source  41, MD Internal Medicine, PGY-1 Pager # [phone] 05/26/19   1:46 PM

## 2019-05-27 NOTE — Progress Notes (Addendum)
   Subjective: HD#159 Overnight, no acute events reported.   Patient seen and evaluated at bedside. He is resting comfortably in bed and no acute distress. Ongoing discussions regarding physical therapy; however, patient still resistant.    Objective:  Vital signs in last 24 hours: Vitals:   05/26/19 1630 05/26/19 1954 05/26/19 2336 05/27/19 0353  BP: 132/86 134/81 (!) 134/94 (!) 123/93  Pulse: 75 82 67 60  Resp: 18 18 18 18   Temp: 98.8 F (37.1 C) 98.5 F (36.9 C) 98.7 F (37.1 C) 98.1 F (36.7 C)  TempSrc: Oral Oral Oral Oral  SpO2: 97% 99%  96%  Weight:      Height:       Physical Exam Physical Exam  Constitutional: He is oriented to person, place, and time and well-developed, well-nourished, and in no distress.  Cardiovascular: Normal rate, regular rhythm, normal heart sounds and intact distal pulses. Exam reveals no gallop and no friction rub.  No murmur heard. Pulmonary/Chest: Effort normal and breath sounds normal. No respiratory distress.  Abdominal: Soft. Bowel sounds are normal. He exhibits no distension. There is no abdominal tenderness.  Neurological: He is alert and oriented to person, place, and time.  Right sided 2/4 strength in upper and lower extremities  Skin: Skin is warm and dry.  Psychiatric: Affect normal.    Assessment/Plan: Mr. Age is a 59 year old male with PMHx of substance use who was admitted forL ACA/MCA watershed infarctresulting in right-sided upper and lower extremity deficits.He is currently medically stable for discharge, however due to his uninsured status awaiting SNF placement.  L ACA/MCA watershed infarct w/ R sided deficits   Patient continues to decline working with physical and occupational therapy. Will continue to monitor and encourage patient to participate with physical therapy.  - Given that patient is bed bound, nursing to reposition to prevent decubitus ulcers  - Continue Aspirin 81 mg daily  -Continue Rosuvastatin 40  mg daily   Acute on chronic kidney disease:  sCr 1.72 from 1.47 in 2 weeks. Patient has good oral intake. BMP not collected this morning as patient refused. Discussed importance of monitoring renal function. Patient currently notes that he does not wish to have any lab draws.  - Continue to monitor and encourage participation with care plan  Depressed mood He is interactive with normal affect.  -Fluoxetine 80 mg daily - Mirtazapine 7.5mg  nightly   Hypertension Currently well controlled. -Metoprolol 25 mg daily -Hydralazine 75 mg daily   Hx of substance and alcohol abuse: - Thiamine 100mg  daily - Folic acid 1mg  daily   FEN/GI -Diet: Regular, Ensure + Prostat -Fluids: None -MiraLAX daily -Senokot 2 tablets daily  DVT prophylaxis: Lovenox 40 mg daily  CODE STATUS: DNR  Dispo Prior to Admission Living Arrangement: Home Anticipated Discharge Location: SNF  Barriers to Discharge: Bed availability, lack of payer source  41, MD Internal Medicine, PGY-1 Pager # [phone] 05/27/19   6:58 AM

## 2019-05-28 LAB — BASIC METABOLIC PANEL
Anion gap: 9 (ref 5–15)
BUN: 24 mg/dL — ABNORMAL HIGH (ref 6–20)
CO2: 24 mmol/L (ref 22–32)
Calcium: 9.3 mg/dL (ref 8.9–10.3)
Chloride: 105 mmol/L (ref 98–111)
Creatinine, Ser: 1.37 mg/dL — ABNORMAL HIGH (ref 0.61–1.24)
GFR calc Af Amer: >60 mL/min (ref >60)
GFR calc non Af Amer: 56 mL/min — ABNORMAL LOW (ref >60)
Glucose, Bld: 132 mg/dL — ABNORMAL HIGH (ref 70–99)
Potassium: 4.3 mmol/L (ref 3.5–5.1)
Sodium: 138 mmol/L (ref 135–145)

## 2019-05-28 NOTE — Progress Notes (Signed)
   Subjective: HD#160 Overnight, no acute events reported.   Patient seen and evaluated at bedside. Patient reports that he is feeling well today, no acute events overnight. No questions today. We discussed that we will be getting labs to check his kidney function, he reports that he is willing to do this.   Objective:  Vital signs in last 24 hours: Vitals:   05/27/19 1609 05/27/19 2023 05/28/19 0034 05/28/19 0504  BP: (!) 154/98 125/76 127/80 (!) 144/94  Pulse: 75 69 (!) 56 (!) 58  Resp: 18 18 18 20   Temp: 98.2 F (36.8 C) 98 F (36.7 C) (!) 97.5 F (36.4 C) 97.6 F (36.4 C)  TempSrc: Oral Oral Oral Oral  SpO2: 98% 97% 97% 99%  Weight:      Height:       Physical Exam Physical Exam  Constitutional: He is oriented to person, place, and time and well-developed, well-nourished, and in no distress.  Cardiovascular: Normal rate, regular rhythm, normal heart sounds and intact distal pulses. Exam reveals no gallop and no friction rub.  No murmur heard. Pulmonary/Chest: Effort normal and breath sounds normal. No respiratory distress.  Abdominal: Soft. Bowel sounds are normal. He exhibits no distension. There is no abdominal tenderness.  Neurological: He is alert and oriented to person, place, and time.  Right sided 2/4 strength in upper and lower extremities  Skin: Skin is warm and dry.  Psychiatric: Affect normal.    Assessment/Plan: Mr. Damore is a 59 year old male with PMHx of substance use who was admitted forL ACA/MCA watershed infarctresulting in right-sided upper and lower extremity deficits.He is currently medically stable for discharge, however due to his uninsured status awaiting SNF placement.  L ACA/MCA watershed infarct w/ R sided deficits   Patient continues to decline working with physical and occupational therapy. Will continue to monitor and encourage patient to participate with care plan.  - Given that patient is bed bound, nursing to reposition to prevent  decubitus ulcers  - Continue Aspirin 81 mg daily  -Continue Rosuvastatin 40 mg daily   Acute on chronic kidney disease:  sCr 1.72 from 1.47 in 2 weeks. Patient has good oral intake.  - BMP today  Depressed mood He is interactive with normal affect.  -Fluoxetine 80 mg daily - Mirtazapine 7.5mg  nightly   Hypertension Currently well controlled. -Metoprolol 25 mg daily -Hydralazine 75 mg daily   Hx of substance and alcohol abuse: - Thiamine 100mg  daily - Folic acid 1mg  daily   FEN/GI -Diet: Regular, Ensure + Prostat -Fluids: None -MiraLAX daily -Senokot 2 tablets daily  DVT prophylaxis: Lovenox 40 mg daily  CODE STATUS: DNR  Dispo Prior to Admission Living Arrangement: Home Anticipated Discharge Location: SNF  Barriers to Discharge: Bed availability, lack of payer source  41, MD Internal Medicine, PGY-1 Pager # [phone] 05/28/19   6:40 AM

## 2019-05-29 NOTE — Progress Notes (Signed)
   Subjective: HD#161 Overnight, no acute events reported.   Patient seen and evaluated at bedside. Patient reports that he is feeling well today. We discussed ongoing need for physical therapy but patient continues to decline.   Objective:  Vital signs in last 24 hours: Vitals:   05/28/19 1635 05/28/19 2031 05/28/19 2117 05/29/19 0024  BP: (!) 127/96 (!) 146/101 (!) 140/94 (!) 137/92  Pulse: 70 71 72 64  Resp: 20  16 17   Temp: 98.5 F (36.9 C) 98.1 F (36.7 C) 98.5 F (36.9 C) (!) 97.5 F (36.4 C)  TempSrc: Oral Oral Oral Oral  SpO2: 98% 97% 97% 93%  Weight:      Height:       Physical Exam Physical Exam  Constitutional: He is oriented to person, place, and time and well-developed, well-nourished, and in no distress.  Cardiovascular: Normal rate, regular rhythm, normal heart sounds and intact distal pulses. Exam reveals no gallop and no friction rub.  No murmur heard. Pulmonary/Chest: Effort normal and breath sounds normal. No respiratory distress.  Abdominal: Soft. Bowel sounds are normal. He exhibits no distension. There is no abdominal tenderness.  Neurological: He is alert and oriented to person, place, and time.  Right sided 2/4 strength in upper and lower extremities  Skin: Skin is warm and dry.  Psychiatric: Affect normal.    Assessment/Plan: Mr. Buffalo is a 59 year old male with PMHx of substance use who was admitted forL ACA/MCA watershed infarctresulting in right-sided upper and lower extremity deficits.He is currently medically stable for discharge, however due to his uninsured status awaiting SNF placement.  L ACA/MCA watershed infarct w/ R sided deficits   Patient continues to decline working with physical and occupational therapy. Will continue to monitor and encourage patient to participate with care plan.  - Given that patient is bed bound, nursing to reposition to prevent decubitus ulcers  - Continue Aspirin 81 mg daily  -Continue Rosuvastatin 40 mg  daily   Acute on chronic kidney disease:  Improved to 1.37 from 1.72. - BMP every 2 weeks  Depressed mood He is interactive with normal affect.  -Fluoxetine 80 mg daily - Mirtazapine 7.5mg  nightly   Hypertension Currently well controlled. -Metoprolol 25 mg daily -Hydralazine 75 mg daily   Hx of substance and alcohol abuse: - Thiamine 100mg  daily - Folic acid 1mg  daily   FEN/GI -Diet: Regular, Ensure + Prostat -Fluids: None -MiraLAX daily -Senokot 2 tablets daily  DVT prophylaxis: Lovenox 40 mg daily  CODE STATUS: DNR  Dispo Prior to Admission Living Arrangement: Home Anticipated Discharge Location: SNF  Barriers to Discharge: Bed availability, lack of payer source  41, MD Internal Medicine, PGY-1 Pager # [phone] 05/29/19   6:25 AM

## 2019-05-30 NOTE — Plan of Care (Signed)
  Problem: Education: Goal: Knowledge of General Education information will improve Description: Including pain rating scale, medication(s)/side effects and non-pharmacologic comfort measures Outcome: Progressing   Problem: Health Behavior/Discharge Planning: Goal: Ability to manage health-related needs will improve Outcome: Progressing   Problem: Clinical Measurements: Goal: Ability to maintain clinical measurements within normal limits will improve Outcome: Progressing Goal: Will remain free from infection Outcome: Progressing Goal: Diagnostic test results will improve Outcome: Progressing   Problem: Activity: Goal: Risk for activity intolerance will decrease Outcome: Progressing   Problem: Elimination: Goal: Will not experience complications related to urinary retention Outcome: Progressing   Problem: Safety: Goal: Ability to remain free from injury will improve Outcome: Progressing   Problem: Skin Integrity: Goal: Risk for impaired skin integrity will decrease Outcome: Progressing   Problem: Education: Goal: Knowledge of disease or condition will improve Outcome: Progressing Goal: Knowledge of secondary prevention will improve Outcome: Progressing Goal: Knowledge of patient specific risk factors addressed and post discharge goals established will improve Outcome: Progressing Goal: Individualized Educational Video(s) Outcome: Progressing   Problem: Coping: Goal: Will verbalize positive feelings about self Outcome: Progressing Goal: Will identify appropriate support needs Outcome: Progressing   Problem: Health Behavior/Discharge Planning: Goal: Ability to manage health-related needs will improve Outcome: Progressing   Problem: Self-Care: Goal: Ability to participate in self-care as condition permits will improve Outcome: Progressing Goal: Verbalization of feelings and concerns over difficulty with self-care will improve Outcome: Progressing   Problem:  Ischemic Stroke/TIA Tissue Perfusion: Goal: Complications of ischemic stroke/TIA will be minimized Outcome: Progressing

## 2019-05-30 NOTE — Progress Notes (Signed)
   Subjective: Patient reports feeling well today, no acute events overnight.  Just ate breakfast. Still declining PT.   Objective:  Vital signs in last 24 hours: Vitals:   05/29/19 1632 05/29/19 2014 05/30/19 0004 05/30/19 0344  BP: (!) 129/101 118/85 (!) 134/94 125/88  Pulse: 61 71 (!) 58 62  Resp: 20 18 18 18   Temp: 98 F (36.7 C) 97.8 F (36.6 C) 97.7 F (36.5 C) 98.1 F (36.7 C)  TempSrc: Oral Oral Oral Oral  SpO2: 96% 95% 93% 94%  Weight:      Height:        General: Middle-aged male, no acute distress, lying in bed Cardiac: RRR, no murmurs rubs or gallops Pulmonary: CTA BL, no wheezing, rhonchi, rales Abdomen: Soft, nontender, normoactive bowel sounds Extremity: No lower extremity edema Neuro: 2/4 strength in right upper and lower extremities, sensation grossly intact    Assessment/Plan:  Principal Problem:   Cerebral thrombosis with cerebral infarction Active Problems:   Right sided weakness   Anemia of chronic disease   Urinary tract infection   Palliative care by specialist   Goals of care, counseling/discussion   Depression, recurrent (HCC)   Concerned about having social problem   Muscular weakness   DNR (do not resuscitate)  Mr. Scalise is a 59 year old male with PMHx of substance use who was admitted forL ACA/MCA watershed infarctresulting in right-sided upper and lower extremity deficits.He is currently medically stable for discharge, however due to his uninsured status awaiting SNF placement.  L ACA/MCA watershed infarct w/ R sided deficits   Patient still declining physical and Occupational Therapy.  Continues to have 2/4 strength in right upper and lower extremity.  We will continue to encourage patient to participate in PT/OT.  No further work-up at this time.  Awaiting SNF placement. - Given that patient is bed bound, nursing to reposition to prevent decubitus ulcers  -Continue Aspirin 81 mg daily  -Continue Rosuvastatin 40 mg daily    Acute on chronic kidney disease:  Improved to 1.37 from 1.72.  No further work-up. - BMP every 2 weeks  Depressed mood He is interactive with normal affect.  -Fluoxetine 80 mg daily - Mirtazapine 7.5mg  nightly   Hypertension Currently well controlled. -Metoprolol 25 mg daily -Hydralazine 75 mg daily   Hx of substance and alcohol abuse: - Thiamine 100mg  daily - Folic acid 1mg  daily   FEN/GI -Diet: Regular, Ensure + Prostat -Fluids: None -MiraLAX daily  -Senokot 2 tablets daily  DVT prophylaxis: Lovenox 40 mg daily  CODE STATUS: DNR  Dispo Prior to Admission Living Arrangement: Home Anticipated Discharge Location: SNF  Barriers to Discharge: Bed availability, lack of payer source   41, MD 05/30/2019, 7:14 AM Pager: [phone]

## 2019-05-31 NOTE — Progress Notes (Signed)
   Subjective: HD#163 Overnight, no acute events reported.   Patient seen and evaluated at bedside. He is resting comfortably in bed and in no acute distress. He notes that he is working on his strength on his own and does not wish to participate with physical therapy at this time.   Objective:  Vital signs in last 24 hours: Vitals:   05/30/19 1602 05/30/19 1945 05/31/19 0032 05/31/19 0354  BP: 130/86 134/85 133/89 139/80  Pulse: 81 82 66 69  Resp: 18 18 18 18   Temp: 97.8 F (36.6 C) 98.4 F (36.9 C) 98.1 F (36.7 C) 98 F (36.7 C)  TempSrc: Oral Oral Oral Oral  SpO2: 96% 96% 99% 98%  Weight:      Height:       Physical Exam Physical Exam  Constitutional: He is oriented to person, place, and time and well-developed, well-nourished, and in no distress.  Cardiovascular: Normal rate, regular rhythm, normal heart sounds and intact distal pulses. Exam reveals no gallop and no friction rub.  No murmur heard. Pulmonary/Chest: Effort normal and breath sounds normal. No respiratory distress.  Abdominal: Soft. Bowel sounds are normal. He exhibits no distension. There is no abdominal tenderness.  Neurological: He is alert and oriented to person, place, and time.  Right sided 2/4 strength in upper and lower extremities  Skin: Skin is warm and dry.  Psychiatric: Affect normal.    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  L ACA/MCA watershed infarct w/ R sided deficits   Patient continues to decline working with physical and occupational therapy. He continues to have 2/4 strength on right side. Will continue to encourage participation with PT/OT. Awaiting SNF placement.  - Given that patient is bed bound, nursing to reposition to prevent  decubitus ulcers  - Continue Aspirin 81 mg daily  -Continue Rosuvastatin 40 mg daily   Acute on chronic kidney disease:  Improved to 1.37 from 1.72. Patient with good PO intake.  - BMP every 2 weeks  Depressed mood He is interactive with normal affect.  -Fluoxetine 80 mg daily - Mirtazapine 7.5mg  nightly   Hypertension Currently well controlled. -Metoprolol 25 mg daily -Hydralazine 75 mg daily   Hx of substance and alcohol abuse: - Thiamine 100mg  daily - Folic acid 1mg  daily   FEN/GI -Diet: Regular, Ensure + Prostat -Fluids: None -MiraLAX daily -Senokot 2 tablets daily  DVT prophylaxis: Lovenox 40 mg daily  CODE STATUS: DNR  Dispo Prior to Admission Living Arrangement: Home Anticipated Discharge Location: SNF  Barriers to Discharge: Bed availability, lack of payer source  41, MD Internal Medicine, PGY-1 Pager # [phone] 05/31/19   6:21 AM

## 2019-05-31 NOTE — Progress Notes (Signed)
Resident checked frequently throughout shift for needs and safety. Bed alarm on and functioning properly. Bed in lowest position with wheels locked and call bell within reach. 

## 2019-06-01 NOTE — Progress Notes (Signed)
   Subjective: HD#164 Overnight, no acute events reported.   Patient seen and evaluated at bedside. He is resting comfortably in bed and in no acute distress. Patient notes that he is depressed and wants to be left alone. Discussed possibly going outside today for which patient was receptive.   Objective:  Vital signs in last 24 hours: Vitals:   05/31/19 2000 05/31/19 2300 06/01/19 0337 06/01/19 0836  BP: (!) 135/97 (!) 149/94 (!) 146/99 (!) 147/94  Pulse: 67 61 60 62  Resp: 19 18 18 19   Temp: 97.7 F (36.5 C) 97.7 F (36.5 C) 98 F (36.7 C) 98.7 F (37.1 C)  TempSrc: Oral Oral Oral Oral  SpO2: 96% 95% 97% 99%  Weight:      Height:       Physical Exam Physical Exam  Constitutional: He is oriented to person, place, and time and well-developed, well-nourished, and in no distress.  Cardiovascular: Normal rate, regular rhythm, normal heart sounds and intact distal pulses. Exam reveals no gallop and no friction rub.  No murmur heard. Pulmonary/Chest: Effort normal and breath sounds normal. No respiratory distress.  Abdominal: Soft. Bowel sounds are normal. He exhibits no distension. There is no abdominal tenderness.  Neurological: He is alert and oriented to person, place, and time.  Right sided 2/4 strength in upper and lower extremities  Skin: Skin is warm and dry.  Psychiatric: Affect normal.    Assessment/Plan: Mr. Tinoco is a 59 year old male with PMHx of substance use who was admitted forL ACA/MCA watershed infarctresulting in right-sided upper and lower extremity deficits.He is currently medically stable for discharge, however due to his uninsured status awaiting SNF placement.  L ACA/MCA watershed infarct w/ R sided deficits   Patient continues to decline working with physical and occupational therapy. He continues to have 2/4 strength on right side. Will continue to encourage participation with PT/OT. Awaiting SNF placement.  - Given that patient is bed bound,  nursing to reposition to prevent decubitus ulcers  - Continue Aspirin 81 mg daily  -Continue Rosuvastatin 40 mg daily   Acute on chronic kidney disease:  Improved to 1.37 from 1.72. Patient with good PO intake.  - BMP every 2 weeks  Depressed mood He is interactive with normal affect. However, notes that he is depressed and wants to be left alone. Expressed that he has given up on trying to get better. We discussed going up on his anti-depressants, patient does not wish to do so at this time. Offered to call his brother and have patient speak with him; he does not wish to do so at this time. Will continue to monitor.  -Fluoxetine 80 mg daily - Mirtazapine 7.5mg  nightly   Hypertension Currently well controlled. -Metoprolol 25 mg daily -Hydralazine 75 mg daily   Hx of substance and alcohol abuse: - Thiamine 100mg  daily - Folic acid 1mg  daily   FEN/GI -Diet: Regular, Ensure + Prostat -Fluids: None -MiraLAX daily -Senokot 2 tablets daily  DVT prophylaxis: Lovenox 40 mg daily  CODE STATUS: DNR  Dispo Prior to Admission Living Arrangement: Home Anticipated Discharge Location: SNF  Barriers to Discharge: Bed availability, lack of payer source  41, MD Internal Medicine, PGY-1 Pager # [phone] 06/01/19   11:37 AM

## 2019-06-02 NOTE — Progress Notes (Signed)
   Subjective: HD#165 Overnight, no acute events reported.   Patient seen and evaluated at bedside. He is resting comfortably in bed and in no acute distress. He does not have any acute concerns at this time.   Objective:  Vital signs in last 24 hours: Vitals:   06/01/19 1546 06/01/19 1944 06/01/19 2326 06/02/19 0334  BP: 129/82 132/85 132/88 (!) 142/94  Pulse: 66 72 64 63  Resp: 18 18 18 19   Temp: 98.5 F (36.9 C) (!) 97.5 F (36.4 C) 98.1 F (36.7 C) (!) 97.3 F (36.3 C)  TempSrc: Oral Oral Oral Oral  SpO2: 97% 96% 94% 96%  Weight:      Height:       Physical Exam Physical Exam  Constitutional: He is oriented to person, place, and time and well-developed, well-nourished, and in no distress.  Cardiovascular: Normal rate, regular rhythm, normal heart sounds and intact distal pulses. Exam reveals no gallop and no friction rub.  No murmur heard. Pulmonary/Chest: Effort normal and breath sounds normal. No respiratory distress.  Abdominal: Soft. Bowel sounds are normal. He exhibits no distension. There is no abdominal tenderness.  Neurological: He is alert and oriented to person, place, and time.  Right sided 2/4 strength in upper and lower extremities  Skin: Skin is warm and dry.  Psychiatric: Affect normal.    Assessment/Plan: Mr. Sheldon is a 59 year old male with PMHx of substance use who was admitted forL ACA/MCA watershed infarctresulting in right-sided upper and lower extremity deficits.He is currently medically stable for discharge, however due to his uninsured status awaiting SNF placement.  L ACA/MCA watershed infarct w/ R sided deficits   Patient continues to decline working with physical and occupational therapy. He continues to have 2/4 strength on right side. Will continue to encourage PT/OT participation. - Given that patient is bed bound, nursing to reposition to prevent decubitus ulcers  - Continue Aspirin 81 mg daily  -Continue Rosuvastatin 40 mg daily    Acute on chronic kidney disease:  Improved to 1.37 from 1.72. Patient with good PO intake.  - BMP every 2 weeks  Depressed mood He is interactive with normal affect but depressed mood. Patient does not wish to speak with anyone or increase medications at this time. Will continue to monitor  -Fluoxetine 80 mg daily - Mirtazapine 7.5mg  nightly   Hypertension Currently well controlled. -Metoprolol 25 mg daily -Hydralazine 75 mg daily   Hx of substance and alcohol abuse: - Thiamine 100mg  daily - Folic acid 1mg  daily   FEN/GI -Diet: Regular, Ensure + Prostat -Fluids: None -MiraLAX daily -Senokot 2 tablets daily  DVT prophylaxis: Lovenox 40 mg daily  CODE STATUS: DNR  Dispo Prior to Admission Living Arrangement: Home Anticipated Discharge Location: SNF  Barriers to Discharge: Bed availability, lack of payer source  41, MD Internal Medicine, PGY-1 Pager # [phone] 06/02/19   6:17 AM

## 2019-06-03 LAB — GLUCOSE, CAPILLARY: Glucose-Capillary: 98 mg/dL (ref 70–99)

## 2019-06-03 MED ORDER — MIRTAZAPINE 15 MG PO TABS
15.0000 mg | ORAL_TABLET | Freq: Every day | ORAL | Status: DC
  Administered 2019-06-03 – 2019-06-13 (×11): 15 mg via ORAL
  Filled 2019-06-03 (×11): qty 1

## 2019-06-03 NOTE — Plan of Care (Signed)
  Problem: Education: Goal: Knowledge of General Education information will improve Description Including pain rating scale, medication(s)/side effects and non-pharmacologic comfort measures Outcome: Progressing   

## 2019-06-03 NOTE — Progress Notes (Signed)
   Subjective: HD#166 Overnight, no acute events reported.   Patient seen and evaluated at bedside. He is resting comfortably in bed and in no acute distress. He does not have any acute concerns today. Extensive discussion regarding hospitalization and prognosis. Patient noted he is depressed and wants to be left alone. Encouraged patient to allow for adjustment of antidepressants.   Objective:  Vital signs in last 24 hours: Vitals:   06/02/19 2338 06/03/19 0354 06/03/19 0809 06/03/19 1200  BP: 129/89 (!) 142/96 (!) 153/94 (!) 140/97  Pulse: 65 63 67 65  Resp: 19 18 16 20   Temp: 97.6 F (36.4 C) 97.6 F (36.4 C) 98.4 F (36.9 C) 98.5 F (36.9 C)  TempSrc: Oral Oral Oral Oral  SpO2: 95% 93% 99% 97%  Weight:      Height:       Physical Exam Physical Exam  Constitutional: He is oriented to person, place, and time and well-developed, well-nourished, and in no distress.  Cardiovascular: Normal rate, regular rhythm, normal heart sounds and intact distal pulses. Exam reveals no gallop and no friction rub.  No murmur heard. Pulmonary/Chest: Effort normal and breath sounds normal. No respiratory distress.  Abdominal: Soft. Bowel sounds are normal. He exhibits no distension. There is no abdominal tenderness.  Neurological: He is alert and oriented to person, place, and time.  Right sided 2/4 strength in upper and lower extremities  Skin: Skin is warm and dry.  Psychiatric: Affect normal.    Assessment/Plan: Mr. Guarisco is a 59 year old male with PMHx of substance use who was admitted forL ACA/MCA watershed infarctresulting in right-sided upper and lower extremity deficits.He is currently medically stable for discharge, however due to his uninsured status awaiting SNF placement.  L ACA/MCA watershed infarct w/ R sided deficits   Patient continues to decline working with physical and occupational therapy. He continues to have 2/4 strength on right side. Will continue encouraging PT/OT  participation; hopeful that he will be agreeable once his mood improves.  - Given that patient is bed bound, nursing to reposition to prevent decubitus ulcers  - Continue Aspirin 81 mg daily  -Continue Rosuvastatin 40 mg daily   Acute on chronic kidney disease:  Improved to 1.37 from 1.72. Patient with good PO intake.  - BMP every 2 weeks  Depressed mood He is interactive with normal affect but depressed mood. After extensive discussion, patient agreed to increase anti-depressant at this time.  -Fluoxetine 80 mg daily - Mirtazapine increased to 15mg  nightly   Hypertension Currently well controlled. -Metoprolol 25 mg daily -Hydralazine 75 mg daily   Hx of substance and alcohol abuse: - Thiamine 100mg  daily - Folic acid 1mg  daily   FEN/GI -Diet: Regular, Ensure + Prostat -Fluids: None -MiraLAX daily -Senokot 2 tablets daily  DVT prophylaxis: Lovenox 40 mg daily  CODE STATUS: DNR  Dispo Prior to Admission Living Arrangement: Home Anticipated Discharge Location: SNF  Barriers to Discharge: Bed availability, lack of payer source  41, MD Internal Medicine, PGY-1 Pager # [phone] 06/03/19   1:01 PM

## 2019-06-04 DIAGNOSIS — F1910 Other psychoactive substance abuse, uncomplicated: Secondary | ICD-10-CM

## 2019-06-04 DIAGNOSIS — I129 Hypertensive chronic kidney disease with stage 1 through stage 4 chronic kidney disease, or unspecified chronic kidney disease: Secondary | ICD-10-CM

## 2019-06-04 DIAGNOSIS — N189 Chronic kidney disease, unspecified: Secondary | ICD-10-CM

## 2019-06-04 NOTE — Progress Notes (Signed)
   Subjective: HD#167 Overnight, no acute events reported.   Patient seen and evaluated at bedside. He is resting comfortably in bed; he just ate breakfast. No acute concerns at this time.   Objective:  Vital signs in last 24 hours: Vitals:   06/03/19 1200 06/03/19 1620 06/03/19 2104 06/04/19 0431  BP: (!) 140/97 (!) 139/111 (!) 138/94 (!) 145/92  Pulse: 65 69 69 63  Resp: 20 17 18 18   Temp: 98.5 F (36.9 C) 98.6 F (37 C) 97.6 F (36.4 C) 97.7 F (36.5 C)  TempSrc: Oral Oral Oral Oral  SpO2: 97% 95% 95% 96%  Weight:      Height:       Physical Exam Physical Exam  Constitutional: He is oriented to person, place, and time and well-developed, well-nourished, and in no distress.  Cardiovascular: Normal rate, regular rhythm, normal heart sounds and intact distal pulses. Exam reveals no gallop and no friction rub.  No murmur heard. Pulmonary/Chest: Effort normal and breath sounds normal. No respiratory distress.  Abdominal: Soft. Bowel sounds are normal. He exhibits no distension. There is no abdominal tenderness.  Neurological: He is alert and oriented to person, place, and time.  Right sided 2/4 strength in upper and lower extremities  Skin: Skin is warm and dry.  Psychiatric: Affect normal.    Assessment/Plan: Mr. Hyle is a 59 year old male with PMHx of substance use who was admitted forL ACA/MCA watershed infarctresulting in right-sided upper and lower extremity deficits.He is currently medically stable for discharge, however due to his uninsured status awaiting SNF placement.  L ACA/MCA watershed infarct w/ R sided deficits   Patient continues to decline working with physical and occupational therapy. He continues to have 2/4 strength on right side.  - Given that patient is bed bound, nursing to reposition to prevent decubitus ulcers  - Continue Aspirin 81 mg daily  -Continue Rosuvastatin 40 mg daily  - Continue to encourage PT/OT participation  Acute on chronic  kidney disease:  Improved to 1.37 from 1.72. Patient with good PO intake.  - BMP on 2/28  Depressed mood He is interactive with normal affect but depressed mood. After extensive discussion, patient agreed to increase anti-depressant on 2/25. -Fluoxetine 80 mg daily - Mirtazapine 15mg  nightly   Hypertension Currently well controlled. -Metoprolol 25 mg daily -Hydralazine 75 mg daily   Hx of substance and alcohol abuse: - Thiamine 100mg  daily - Folic acid 1mg  daily   FEN/GI -Diet: Regular, Ensure + Prostat -Fluids: None -MiraLAX daily -Senokot 2 tablets daily  DVT prophylaxis: Lovenox 40 mg daily  CODE STATUS: DNR  Dispo Prior to Admission Living Arrangement: Home Anticipated Discharge Location: SNF  Barriers to Discharge: Bed availability, lack of payer source  3/25, MD Internal Medicine, PGY-1 Pager # [phone] 06/04/19   6:55 AM

## 2019-06-04 NOTE — TOC Progression Note (Signed)
Transition of Care [facility]) - Progression Note    Patient Details  Name: Philip Vasquez MRN: [id number] Date of Birth: [birth year]  Transition of Care [facility]) CM/SW Contact  Baldemar Lenis, [place] Phone Number: 06/04/2019, 4:33 PM  Clinical Narrative:   CSW received call this morning from [employer] that patient's disability has been approved. CSW assisted Social Security representative with speaking with patient to update him, as well as confirm that his brother, Casimiro Needle, will be his payee. CSW then faxed referral back out to SNFs who would accept patient with long term care Medicaid, including [address], [facility], [place], and [facility]. [facility] and [facility] declined the patient, but [place] and [facility] are still reviewing, unable to offer a bed until next week at the earliest but will update CSW after final review. CSW to continue to follow.     Expected Discharge Plan: Skilled Nursing Facility Barriers to Discharge: SNF Pending bed offer  Expected Discharge Plan and Services Expected Discharge Plan: Skilled Nursing Facility In-house Referral: Clinical Social Work Discharge Planning Services: CM Consult                                           Social Determinants of Health (SDOH) Interventions    Readmission Risk Interventions No flowsheet data found.

## 2019-06-05 NOTE — Progress Notes (Signed)
   Subjective: HD#168 Overnight, no acute events reported.   Patient seen and evaluated at bedside.He is resting comfortably in bed and does not have any acute concerns. Patient aware that disability has been approved and currently pending SNF bed.   Objective:  Vital signs in last 24 hours: Vitals:   06/04/19 1244 06/04/19 1406 06/04/19 1603 06/05/19 0330  BP: 128/89 132/86 132/88 (!) 128/93  Pulse: 64 71 73 65  Resp: 20 18 16 19   Temp: 98 F (36.7 C) 97.7 F (36.5 C) 97.7 F (36.5 C) 98.5 F (36.9 C)  TempSrc: Oral Oral Oral   SpO2: 99% 97% 95% 95%  Weight:      Height:       Physical Exam Physical Exam  Constitutional: He is oriented to person, place, and time and well-developed, well-nourished, and in no distress.  Cardiovascular: Normal rate, regular rhythm, normal heart sounds and intact distal pulses. Exam reveals no gallop and no friction rub.  No murmur heard. Pulmonary/Chest: Effort normal and breath sounds normal. No respiratory distress.  Abdominal: Soft. Bowel sounds are normal. He exhibits no distension. There is no abdominal tenderness.  Neurological: He is alert and oriented to person, place, and time.  Right sided 2/4 strength in upper and lower extremities  Skin: Skin is warm and dry.  Psychiatric: Affect normal.    Assessment/Plan: Mr. Crumby is a 59 year old male with PMHx of substance use who was admitted forL ACA/MCA watershed infarctresulting in right-sided upper and lower extremity deficits.He is currently medically stable for discharge, however due to his uninsured status awaiting SNF placement.  L ACA/MCA watershed infarct w/ R sided deficits   Patient to have 2/4 strength on the right side. Encouraged to participate with PT/OT given that he has potential SNF placement given his Medicaid has been approved. Patient is hesitant but notes that he will think about it.  - Given that patient is bed bound, nursing to reposition to prevent decubitus  ulcers  - Continue Aspirin 81 mg daily  -Continue Rosuvastatin 40 mg daily  - Continue to encourage PT/OT participation  Acute on chronic kidney disease:  Improved to 1.37 from 1.72. Patient with good PO intake.  - BMP in AM  Depressed mood He is interactive with normal affect but depressed mood.  -Fluoxetine 80 mg daily - Mirtazapine 15mg  nightly   Hypertension Currently well controlled. -Metoprolol 25 mg daily -Hydralazine 75 mg daily   Hx of substance and alcohol abuse: - Thiamine 100mg  daily - Folic acid 1mg  daily   FEN/GI -Diet: Regular, Ensure + Prostat -Fluids: None -MiraLAX daily -Senokot 2 tablets daily  DVT prophylaxis: Lovenox 40 mg daily  CODE STATUS: DNR  Dispo Prior to Admission Living Arrangement: Home Anticipated Discharge Location: SNF  Barriers to Discharge: SNF bed availability  41, MD Internal Medicine, PGY-1 Pager # [phone] 06/05/19   6:35 AM

## 2019-06-06 LAB — BASIC METABOLIC PANEL
Anion gap: 10 (ref 5–15)
BUN: 32 mg/dL — ABNORMAL HIGH (ref 6–20)
CO2: 24 mmol/L (ref 22–32)
Calcium: 9.6 mg/dL (ref 8.9–10.3)
Chloride: 104 mmol/L (ref 98–111)
Creatinine, Ser: 1.71 mg/dL — ABNORMAL HIGH (ref 0.61–1.24)
GFR calc Af Amer: 50 mL/min — ABNORMAL LOW (ref >60)
GFR calc non Af Amer: 43 mL/min — ABNORMAL LOW (ref >60)
Glucose, Bld: 101 mg/dL — ABNORMAL HIGH (ref 70–99)
Potassium: 4.7 mmol/L (ref 3.5–5.1)
Sodium: 138 mmol/L (ref 135–145)

## 2019-06-06 NOTE — Progress Notes (Signed)
   Subjective: HD#169 Overnight, no acute events reported.   Patient seen and evaluated at bedside. He is resting comfortably in bed and does not express any acute concerns today.   Objective:  Vital signs in last 24 hours: Vitals:   06/05/19 1126 06/05/19 1512 06/05/19 2021 06/06/19 0332  BP: 119/88 122/70 116/89 (!) 129/98  Pulse: 65 68 72 66  Resp: 18 20 19 16   Temp: 98 F (36.7 C) 98 F (36.7 C) 98.1 F (36.7 C) 98.5 F (36.9 C)  TempSrc: Oral Oral Oral Oral  SpO2: 100% 100% 93% 97%  Weight:      Height:       Physical Exam Physical Exam  Constitutional: He is oriented to person, place, and time and well-developed, well-nourished, and in no distress.  Cardiovascular: Normal rate, regular rhythm, normal heart sounds and intact distal pulses. Exam reveals no gallop and no friction rub.  No murmur heard. Pulmonary/Chest: Effort normal and breath sounds normal. No respiratory distress.  Abdominal: Soft. Bowel sounds are normal. He exhibits no distension. There is no abdominal tenderness.  Neurological: He is alert and oriented to person, place, and time.  Right sided 2/4 strength in upper and lower extremities  Skin: Skin is warm and dry.  Psychiatric: Affect normal.    Assessment/Plan: Mr. Tupper is a 59 year old male with PMHx of substance use who was admitted forL ACA/MCA watershed infarctresulting in right-sided upper and lower extremity deficits.He is currently medically stable for discharge, however due to his uninsured status awaiting SNF placement.  L ACA/MCA watershed infarct w/ R sided deficits   Patient to have 2/4 strength on the right side. Patient continues to be hesitant regarding participation with PT/OT. His Medicaid has been approved and is currently being reviewed by [facility] and [facility].  - Given that patient is bed bound, nursing to reposition to prevent decubitus ulcers  - Continue Aspirin 81 mg daily  -Continue Rosuvastatin 40 mg daily    - Continue to encourage PT/OT participation  Acute on chronic kidney disease:  Improved to 1.37 from 1.72 on 2/19. Patient with good PO intake. BMP ordered but not collected this morning - f/u BMP   Depressed mood He is interactive with normal affect but depressed mood.  -Fluoxetine 80 mg daily - Mirtazapine 15mg  nightly   Hypertension Currently well controlled. -Metoprolol 25 mg daily -Hydralazine 75 mg daily   Hx of substance and alcohol abuse: - Thiamine 100mg  daily - Folic acid 1mg  daily   FEN/GI -Diet: Regular, Ensure + Prostat -Fluids: None -MiraLAX daily -Senokot 2 tablets daily  DVT prophylaxis: Lovenox 40 mg daily  CODE STATUS: DNR  Dispo Prior to Admission Living Arrangement: Home Anticipated Discharge Location: SNF  Barriers to Discharge: SNF bed availability  3/19, MD Internal Medicine, PGY-1 Pager # [phone] 06/06/19   6:29 AM

## 2019-06-07 MED ORDER — SODIUM CHLORIDE 0.9 % IV BOLUS
1000.0000 mL | Freq: Once | INTRAVENOUS | Status: AC
  Administered 2019-06-07: 16:00:00 1000 mL via INTRAVENOUS

## 2019-06-07 NOTE — Plan of Care (Signed)
Plan of care reviewed with pt at bedside. Call bell in reach. Safety measures in place. 1L NS bolus given. Pt denies needs at this time, will continue to monitor.  Problem: Education: Goal: Knowledge of General Education information will improve Description: Including pain rating scale, medication(s)/side effects and non-pharmacologic comfort measures Outcome: Progressing   Problem: Health Behavior/Discharge Planning: Goal: Ability to manage health-related needs will improve Outcome: Progressing   Problem: Clinical Measurements: Goal: Ability to maintain clinical measurements within normal limits will improve Outcome: Progressing Goal: Will remain free from infection Outcome: Progressing Goal: Diagnostic test results will improve Outcome: Progressing   Problem: Activity: Goal: Risk for activity intolerance will decrease Outcome: Progressing   Problem: Elimination: Goal: Will not experience complications related to urinary retention Outcome: Progressing   Problem: Safety: Goal: Ability to remain free from injury will improve Outcome: Progressing   Problem: Skin Integrity: Goal: Risk for impaired skin integrity will decrease Outcome: Progressing   Problem: Education: Goal: Knowledge of disease or condition will improve Outcome: Progressing Goal: Knowledge of secondary prevention will improve Outcome: Progressing Goal: Knowledge of patient specific risk factors addressed and post discharge goals established will improve Outcome: Progressing Goal: Individualized Educational Video(s) Outcome: Progressing   Problem: Coping: Goal: Will verbalize positive feelings about self Outcome: Progressing Goal: Will identify appropriate support needs Outcome: Progressing   Problem: Health Behavior/Discharge Planning: Goal: Ability to manage health-related needs will improve Outcome: Progressing   Problem: Self-Care: Goal: Ability to participate in self-care as condition permits  will improve Outcome: Progressing Goal: Verbalization of feelings and concerns over difficulty with self-care will improve Outcome: Progressing   Problem: Ischemic Stroke/TIA Tissue Perfusion: Goal: Complications of ischemic stroke/TIA will be minimized Outcome: Progressing

## 2019-06-07 NOTE — Progress Notes (Signed)
   NAME:  Philip Vasquez, MRN:  [id number], DOB:  Dec 15, 1960, LOS: 170 ADMISSION DATE:  12/19/2018  Subjective  No complaints this morning.  Objective   Blood pressure 129/90, pulse 63, temperature 97.9 F (36.6 C), temperature source Oral, resp. rate 16, height 5\' 9"  (1.753 m), weight 123 kg, SpO2 98 %.     Intake/Output Summary (Last 24 hours) at 06/07/2019 1049 Last data filed at 06/07/2019 0905 Gross per 24 hour  Intake 350 ml  Output 1500 ml  Net -1150 ml   Filed Weights   03/24/19 0414 03/26/19 0504 05/14/19 0331  Weight: 133 kg 132 kg 123 kg    Examination: GENERAL: in no acute distress HEENT: head atraumatic. No conjunctival injection. Nares patent.  CARDIAC: heart RRR. No peripheral edema.  PULMONARY: acyanotic. Lung sounds clear to auscultation. ABDOMEN: soft. Nontender to palpation.  Nondistended.  NEURO: alert and oriented SKIN: no rash or lesions on limited exam  PSYCH: depressed affect   Summary  Philip Vasquez is a 59 yo male who was admitted on 12/19/18 for left ACA/MCA CVA resulting in motor deficits of the right upper and lower extremity. He has been stable for discharge however requires further rehabilitation and care and was uninsured until recently when he was finally approved for medicaid.  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 Somewhat improving from prior. Continue asprin and statin. Continue PT/OT  Acute on chronic renal injury. Appears pre-renal in nature. Suspect improvement with IVF. Will give one liter bolus and may add on maintenance fluids there  after. Will recheck renal function tomorrow morning.  Major Depressive Disorder. Likely situation as well. Symptoms have greatly improved over the past few months with starting SSRI and mirtazepine. Will continue prozac and mirtazapine.  Chronic Hypertension. Continue metoprolol and hydralazine  Best practice:  CODE STATUS: DNR Diet: regular DVT for prophylaxis: lovenox Dispo: stable for discharge pending placement. Appreciate SW/case management's assistance greatly   02/18/19, MD INTERNAL MEDICINE RESIDENT PGY-1 PAGER #: [phone] 06/07/19 10:49 AM

## 2019-06-08 LAB — BASIC METABOLIC PANEL
Anion gap: 9 (ref 5–15)
BUN: 25 mg/dL — ABNORMAL HIGH (ref 6–20)
CO2: 26 mmol/L (ref 22–32)
Calcium: 9.8 mg/dL (ref 8.9–10.3)
Chloride: 104 mmol/L (ref 98–111)
Creatinine, Ser: 1.54 mg/dL — ABNORMAL HIGH (ref 0.61–1.24)
GFR calc Af Amer: 57 mL/min — ABNORMAL LOW (ref >60)
GFR calc non Af Amer: 49 mL/min — ABNORMAL LOW (ref >60)
Glucose, Bld: 104 mg/dL — ABNORMAL HIGH (ref 70–99)
Potassium: 4.1 mmol/L (ref 3.5–5.1)
Sodium: 139 mmol/L (ref 135–145)

## 2019-06-08 NOTE — Plan of Care (Signed)
  Problem: Education: Goal: Knowledge of General Education information will improve Description: Including pain rating scale, medication(s)/side effects and non-pharmacologic comfort measures Outcome: Progressing   Problem: Health Behavior/Discharge Planning: Goal: Ability to manage health-related needs will improve Outcome: Progressing   Problem: Clinical Measurements: Goal: Ability to maintain clinical measurements within normal limits will improve Outcome: Progressing Goal: Will remain free from infection Outcome: Progressing Goal: Diagnostic test results will improve Outcome: Progressing   Problem: Activity: Goal: Risk for activity intolerance will decrease Outcome: Progressing   Problem: Elimination: Goal: Will not experience complications related to urinary retention Outcome: Progressing   Problem: Safety: Goal: Ability to remain free from injury will improve Outcome: Progressing   Problem: Skin Integrity: Goal: Risk for impaired skin integrity will decrease Outcome: Progressing   Problem: Education: Goal: Knowledge of disease or condition will improve Outcome: Progressing Goal: Knowledge of secondary prevention will improve Outcome: Progressing Goal: Knowledge of patient specific risk factors addressed and post discharge goals established will improve Outcome: Progressing Goal: Individualized Educational Video(s) Outcome: Progressing   Problem: Coping: Goal: Will verbalize positive feelings about self Outcome: Progressing Goal: Will identify appropriate support needs Outcome: Progressing   Problem: Health Behavior/Discharge Planning: Goal: Ability to manage health-related needs will improve Outcome: Progressing   Problem: Self-Care: Goal: Ability to participate in self-care as condition permits will improve Outcome: Progressing Goal: Verbalization of feelings and concerns over difficulty with self-care will improve Outcome: Progressing   Problem:  Ischemic Stroke/TIA Tissue Perfusion: Goal: Complications of ischemic stroke/TIA will be minimized Outcome: Progressing   

## 2019-06-08 NOTE — Progress Notes (Signed)
Noted that pt's morning lab (BMP) had not been collected at 0500. Phlebotomy informed at 0600, per phlebotomy "lab not showing on there en, but will collect".

## 2019-06-08 NOTE — Progress Notes (Signed)
   NAME:  Philip Vasquez, MRN:  [id number], DOB:  February 14, 1961, LOS: 171 ADMISSION DATE:  12/19/2018  Subjective  No overnight events No complaints this morning. We worked on setting up his tablet to use the internet on.  Objective   Blood pressure 129/83, pulse (!) 58, temperature 99.1 F (37.3 C), temperature source Axillary, resp. rate 17, height 5\' 9"  (1.753 m), weight 123 kg, SpO2 97 %.     Intake/Output Summary (Last 24 hours) at 06/08/2019 0546 Last data filed at 06/07/2019 2227 Gross per 24 hour  Intake 2139.33 ml  Output 930 ml  Net 1209.33 ml   Filed Weights   03/24/19 0414 03/26/19 0504 05/14/19 0331  Weight: 133 kg 132 kg 123 kg    Examination: GENERAL: in no acute distress HEENT: head atraumatic. No conjunctival injection. Nares patent.  CARDIAC: heart RRR. No peripheral edema.  PULMONARY: acyanotic. Lung sounds clear to auscultation. ABDOMEN: soft. Nontender to palpation.  Nondistended.  NEURO: alert and oriented SKIN: no rash or lesions on limited exam  PSYCH: depressed affect   Summary  Philip Vasquez is a 59 yo male who was admitted on 12/19/18 for left ACA/MCA CVA resulting in motor deficits of the right upper and lower extremity. He has been stable for discharge however requires further rehabilitation and care and was uninsured until recently when he was finally approved for medicaid.  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 Somewhat improving from prior. Continue asprin and statin. Continue PT/OT  Acute on chronic renal injury. Appears pre-renal in nature.  Suspect improvement with IVF. Will give one liter bolus and may add on maintenance fluids there after. Will recheck renal function tomorrow morning.  Major Depressive Disorder. Likely situation as well. Symptoms have greatly improved over the past few months with starting SSRI and mirtazepine. Will continue prozac and mirtazapine.  Chronic Hypertension. Continue metoprolol and hydralazine  Best practice:  CODE STATUS: DNR Diet: regular DVT for prophylaxis: lovenox Dispo: stable for discharge pending placement. Appreciate SW/case management's assistance greatly   02/18/19, MD INTERNAL MEDICINE RESIDENT PGY-1 PAGER #: [phone] 06/08/19 5:46 AM

## 2019-06-09 NOTE — Progress Notes (Signed)
   NAME:  Philip Vasquez, MRN:  [id number], DOB:  1960-10-13, LOS: 172 ADMISSION DATE:  12/19/2018  Subjective  No complaints this morning.  Objective   Blood pressure (!) 144/97, pulse 62, temperature 98.2 F (36.8 C), temperature source Oral, resp. rate 17, height 5\' 9"  (1.753 m), weight 123 kg, SpO2 97 %.     Intake/Output Summary (Last 24 hours) at 06/09/2019 0831 Last data filed at 06/08/2019 2019 Gross per 24 hour  Intake --  Output 200 ml  Net -200 ml   Filed Weights   03/24/19 0414 03/26/19 0504 05/14/19 0331  Weight: 133 kg 132 kg 123 kg    Examination: GENERAL: in no acute distress CARDIAC: heart RRR. No peripheral edema.  PULMONARY: acyanotic. Lung sounds clear to auscultation. ABDOMEN: soft. Nontender to palpation.  Nondistended.  PSYCH: flat affect  Summary  Philip Vasquez is a 59 yo male who was admitted on 12/19/18 for left ACA/MCA CVA resulting in motor deficits of the right upper and lower extremity. He has been stable for discharge however requires further rehabilitation and care and was uninsured until recently when he was finally approved for medicaid.  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 Continue asprin and statin. Continue PT/OT  Acute on chronic renal injury. Encourage increase PO fluid intake  Major Depressive Disorder. Likely situation as well. Symptoms have greatly improved over the past few months with starting SSRI and mirtazepine. Will continue prozac and mirtazapine.  Chronic Hypertension. Blood pressures stable. Continue metoprolol and  hydralazine  Best practice:  CODE STATUS: DNR Diet: regular DVT for prophylaxis: lovenox Dispo: stable for discharge pending placement. Appreciate SW/case management's assistance greatly   02/18/19, MD INTERNAL MEDICINE RESIDENT PGY-1 PAGER #: [phone] 06/09/19 8:31 AM

## 2019-06-09 NOTE — Progress Notes (Signed)
I concur with the assessment and med administration implemented and entered by [employer], SN.

## 2019-06-10 NOTE — Progress Notes (Signed)
   NAME:  Philip Vasquez, MRN:  [id number], DOB:  1960-04-18, LOS: 173 ADMISSION DATE:  12/19/2018  Subjective  No overnight events. No complaints this morning.  Objective   Blood pressure 129/89, pulse 63, temperature 98 F (36.7 C), temperature source Oral, resp. rate 16, height 5\' 9"  (1.753 m), weight 123 kg, SpO2 97 %.     Intake/Output Summary (Last 24 hours) at 06/10/2019 0950 Last data filed at 06/09/2019 2100 Gross per 24 hour  Intake 720 ml  Output --  Net 720 ml   Filed Weights   03/24/19 0414 03/26/19 0504 05/14/19 0331  Weight: 133 kg 132 kg 123 kg    Examination: GENERAL: in no acute distress CARDIAC: heart RRR.  PULMONARY: lung sounds clear ABDOMEN: soft. Nontender to palpation.  Nondistended.  PSYCH: flat affect  Summary  Philip Vasquez is a 59 yo male who was admitted on 12/19/18 for left ACA/MCA CVA resulting in motor deficits of the right upper and lower extremity. He has been stable for discharge however requires further rehabilitation and care and was uninsured until recently when he was finally approved for medicaid.  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 Continue asprin and statin. Continue PT/OT  Acute on chronic renal injury. Encourage increase PO fluid intake. Will recheck  Major Depressive Disorder. continue prozac and mirtazapine.  Chronic Hypertension. Blood pressures stable. Continue metoprolol and hydralazine  Best practice:  CODE STATUS: DNR Diet: regular DVT for prophylaxis: lovenox Dispo: stable for discharge pending placement. Appreciate  SW/case management's assistance greatly   02/18/19, MD INTERNAL MEDICINE RESIDENT PGY-1 PAGER #: [phone] 06/10/19 9:50 AM

## 2019-06-11 NOTE — Progress Notes (Signed)
   NAME:  Philip Vasquez, MRN:  [id number], DOB:  [birth year], LOS: 174 ADMISSION DATE:  12/19/2018  Subjective  No overnight events. No complaints this morning.  Objective   Blood pressure (!) 125/97, pulse 63, temperature 98.3 F (36.8 C), temperature source Oral, resp. rate 16, height 5\' 9"  (1.753 m), weight 123 kg, SpO2 99 %.     Intake/Output Summary (Last 24 hours) at 06/11/2019 0606 Last data filed at 06/10/2019 2115 Gross per 24 hour  Intake 360 ml  Output 1325 ml  Net -965 ml   Filed Weights   03/24/19 0414 03/26/19 0504 05/14/19 0331  Weight: 133 kg 132 kg 123 kg    Examination: GENERAL: in no acute distress CARDIAC: heart RRR.  PULMONARY: lung sounds clear ABDOMEN: soft. Nontender to palpation.  Nondistended.  PSYCH: flat affect  Summary  Philip Vasquez is a 59 yo male who was admitted on 12/19/18 for left ACA/MCA CVA resulting in motor deficits of the right upper and lower extremity. He has been stable for discharge however requires further rehabilitation and care and was uninsured until recently when he was finally approved for medicaid.  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 Continue asprin and statin. Continue PT/OT  Acute on chronic renal injury. Encourage increase PO fluid intake. Will recheck  Major Depressive Disorder. continue prozac and mirtazapine.  Chronic Hypertension. Blood pressures stable. Continue metoprolol and hydralazine  Best practice:  CODE STATUS: DNR Diet: regular DVT for prophylaxis: lovenox Dispo: stable for discharge pending placement.  Appreciate SW/case management's assistance greatly   02/18/19, MD INTERNAL MEDICINE RESIDENT PGY-1 PAGER #: [phone] 06/11/19 6:06 AM

## 2019-06-12 NOTE — Progress Notes (Signed)
   Subjective:  Feeling well this morning, no concerns. No pain.   Objective:  Vital signs in last 24 hours: Vitals:   06/11/19 1515 06/11/19 1555 06/11/19 2050 06/12/19 0404  BP: 123/73 125/84 127/79 (!) 129/94  Pulse: 68 70 74 (!) 58  Resp: 18 14 16 18   Temp: 98.4 F (36.9 C) 98 F (36.7 C) 98.1 F (36.7 C) 97.7 F (36.5 C)  TempSrc: Oral Oral Oral Oral  SpO2: 98% 97% 96% 95%  Weight:      Height:        Constitution: NAD, appears stated age Cardio: RRR, no m/r/g, no LE edema  Respiratory: CTA, no w/r/r Abdominal: NTTP, soft, non-distended MSK: moving all extremities, strength 3/5 RUE and RLE Neuro: normal affect, a&ox3 Skin: c/d/i   Assessment/Plan:  Principal Problem:   Cerebral thrombosis with cerebral infarction Active Problems:   Right sided weakness   Anemia of chronic disease   Urinary tract infection   Palliative care by specialist   Goals of care, counseling/discussion   Depression, recurrent (HCC)   Concerned about having social problem   Muscular weakness   DNR (do not resuscitate)  Philip Vasquez is a 59 yo male who was admitted on 12/19/18 for left ACA/MCA CVA resulting in motor deficits of the right upper and lower extremity. He has been stable for discharge however requires further rehabilitation and care and was uninsured until recently when he was finally approved for medicaid.  Left ACA/MCA watershed infarcts Strength improving slowly over time. Cont. Asa and statin. Cont. PT/OT. Now has medicaid, follow-up social work for placement  CKD Stable. Monitoring intermittently.   MDD Cont. prozac & mirtazapine  HTN Cont. Metoprolol & hydralazine   VTE: lovenox IVF: none Diet: regular Code: full  Dispo: Anticipated discharge pending SNF placement.   02/18/19 A, DO 06/12/2019, 7:42 AM Pager: [phone]

## 2019-06-13 MED ORDER — BACLOFEN 10 MG PO TABS: 5.0000 mg | ORAL_TABLET | Freq: Two times a day (BID) | ORAL | Status: DC | PRN

## 2019-06-13 NOTE — Progress Notes (Signed)
   NAME:  Philip Vasquez, MRN:  [id number], DOB:  12/16/1960, LOS: 176 ADMISSION DATE:  12/19/2018  Subjective  No complaints. Eating breakfast.  Objective   Blood pressure (!) 140/95, pulse 69, temperature 98.3 F (36.8 C), resp. rate 17, height 5\' 9"  (1.753 m), weight 123 kg, SpO2 96 %.     Intake/Output Summary (Last 24 hours) at 06/13/2019 1222 Last data filed at 06/12/2019 1913 Gross per 24 hour  Intake 100 ml  Output 700 ml  Net -600 ml   Filed Weights   03/24/19 0414 03/26/19 0504 05/14/19 0331  Weight: 133 kg 132 kg 123 kg    Examination: GENERAL: in no acute distress CARDIAC: heart RRR.  PULMONARY: lung sounds clear ABDOMEN: soft. Nontender to palpation.  Nondistended.  PSYCH: flat affect  Summary  Philip Vasquez is a 59 yo male who was admitted on 12/19/18 for left ACA/MCA CVA resulting in motor deficits of the right upper and lower extremity. He has been stable for discharge however requires further rehabilitation and care and was uninsured until recently when he was finally approved for medicaid.  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 Continue asprin and statin. Continue PT/OT  Acute on chronic renal injury. Encourage increase PO fluid intake. Will recheck a BMP later this week.  Major Depressive Disorder. continue prozac and mirtazapine.  Chronic Hypertension. Blood pressures stable. Continue metoprolol and hydralazine  Muscle spasms. Has been receiving baclofen for several months however unclear if it is providing further benefit at this time. Will try to switch  to prn and will continue to monitor symptoms. Best practice:  CODE STATUS: DNR Diet: regular DVT for prophylaxis: lovenox Dispo: stable for discharge pending placement.    02/18/19, MD INTERNAL MEDICINE RESIDENT PGY-1 PAGER #: [phone] 06/13/19 12:22 PM

## 2019-06-14 LAB — SARS CORONAVIRUS 2 BY RT PCR (DIASORIN): SARS Coronavirus 2: NEGATIVE

## 2019-06-14 MED ORDER — LORATADINE 10 MG PO TABS: 10.0000 mg | ORAL_TABLET | Freq: Every day | ORAL | Status: DC

## 2019-06-14 MED ORDER — MIRTAZAPINE 15 MG PO TABS: 15.0000 mg | ORAL_TABLET | Freq: Every day | ORAL | Status: DC

## 2019-06-14 MED ORDER — THIAMINE HCL 100 MG PO TABS: 100.0000 mg | ORAL_TABLET | Freq: Every day | ORAL | Status: AC

## 2019-06-14 MED ORDER — METOPROLOL TARTRATE 25 MG PO TABS: 25.0000 mg | ORAL_TABLET | Freq: Two times a day (BID) | ORAL | Status: DC

## 2019-06-14 MED ORDER — HYDRALAZINE HCL 25 MG PO TABS: 25.0000 mg | ORAL_TABLET | Freq: Three times a day (TID) | ORAL | Status: DC

## 2019-06-14 MED ORDER — SENNOSIDES-DOCUSATE SODIUM 8.6-50 MG PO TABS: 2.0000 | ORAL_TABLET | Freq: Every day | ORAL | Status: AC

## 2019-06-14 MED ORDER — PANTOPRAZOLE SODIUM 40 MG PO TBEC: 40.0000 mg | DELAYED_RELEASE_TABLET | Freq: Every day | ORAL | Status: DC

## 2019-06-14 MED ORDER — ADULT MULTIVITAMIN W/MINERALS CH: 1.0000 | ORAL_TABLET | Freq: Every day | ORAL | Status: AC

## 2019-06-14 MED ORDER — GERHARDT'S BUTT CREAM: 1.0000 "application " | TOPICAL_CREAM | Freq: Two times a day (BID) | CUTANEOUS | Status: DC

## 2019-06-14 MED ORDER — HYDROCERIN EX CREA: 1.0000 "application " | TOPICAL_CREAM | Freq: Two times a day (BID) | CUTANEOUS | 0 refills | Status: DC

## 2019-06-14 MED ORDER — POLYETHYLENE GLYCOL 3350 17 G PO PACK: 17.0000 g | PACK | Freq: Every day | ORAL | 0 refills | Status: AC

## 2019-06-14 MED ORDER — PRO-STAT SUGAR FREE PO LIQD: 30.0000 mL | Freq: Two times a day (BID) | ORAL | 0 refills | Status: DC

## 2019-06-14 MED ORDER — ASPIRIN 81 MG PO TBEC: 81.0000 mg | DELAYED_RELEASE_TABLET | Freq: Every day | ORAL | Status: AC

## 2019-06-14 MED ORDER — FOLIC ACID 1 MG PO TABS: 1.0000 mg | ORAL_TABLET | Freq: Every day | ORAL | Status: AC

## 2019-06-14 MED ORDER — ENSURE ENLIVE PO LIQD: 237.0000 mL | Freq: Every day | ORAL | 12 refills | Status: DC

## 2019-06-14 MED ORDER — FLUOXETINE HCL 40 MG PO CAPS: 80.0000 mg | ORAL_CAPSULE | Freq: Every day | ORAL | 3 refills | Status: DC

## 2019-06-14 MED ORDER — BACLOFEN 5 MG PO TABS: 5.0000 mg | ORAL_TABLET | Freq: Two times a day (BID) | ORAL | 0 refills | Status: DC | PRN

## 2019-06-14 MED ORDER — ROSUVASTATIN CALCIUM 40 MG PO TABS: 40.0000 mg | ORAL_TABLET | Freq: Every day | ORAL | Status: AC

## 2019-06-14 NOTE — Progress Notes (Signed)
Pt. Being discharged to [facility]. Report called. [facility] here to pick up patient. Belongings including [employer] tablet and new bedroom shoes packed up and sent with PTAR.

## 2019-06-14 NOTE — Progress Notes (Signed)
   NAME:  Philip Vasquez, MRN:  [id number], DOB:  November 25, 1960, LOS: 177 ADMISSION DATE:  12/19/2018  Subjective  Received notification of discharge to [facility] today! Philip Vasquez is ecstatic!  Objective   Blood pressure (!) 133/92, pulse 66, temperature 99.1 F (37.3 C), temperature source Oral, resp. rate 18, height 5\' 9"  (1.753 m), weight 123 kg, SpO2 97 %.     Intake/Output Summary (Last 24 hours) at 06/14/2019 1249 Last data filed at 06/13/2019 1747 Gross per 24 hour  Intake --  Output 600 ml  Net -600 ml   Filed Weights   03/24/19 0414 03/26/19 0504 05/14/19 0331  Weight: 133 kg 132 kg 123 kg    Examination: GENERAL: in no acute distress CARDIAC: heart RRR.  PULMONARY: lung sounds clear ABDOMEN: soft. Nontender to palpation.  Nondistended.  PSYCH: flat affect  Summary  Philip Vasquez is a 59 yo male who was admitted on 12/19/18 for left ACA/MCA CVA resulting in motor deficits of the right upper and lower extremity. He has been stable for discharge however requires further rehabilitation and care and was uninsured until recently when he was finally approved for medicaid.  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 Continue asprin and statin.   Acute on chronic renal injury. Encourage increase PO fluid intake. Will recheck a BMP later this week.  Major Depressive Disorder. continue prozac and mirtazapine.  Chronic Hypertension. Blood pressures stable. Continue metoprolol and hydralazine  Muscle spasms. Has been receiving baclofen for several months however unclear if it is  providing further benefit at this time.  Best practice:  CODE STATUS: DNR Diet: regular DVT for prophylaxis: lovenox Dispo: Discharge today!   02/18/19, MD INTERNAL MEDICINE RESIDENT PGY-1 PAGER #: [phone] 06/14/19 12:49 PM

## 2019-06-14 NOTE — Discharge Instructions (Signed)
Kemar will need follow up with neurology for his stroke. He also needs follow up with urology for urinary retention. He also needs follow up with podiatry for foot care.

## 2019-06-14 NOTE — TOC Transition Note (Signed)
Transition of Care [facility]) - CM/SW Discharge Note   Patient Details  Name: Dary Dilauro MRN: [id number] Date of Birth: 06/03/1960  Transition of Care [facility]) CM/SW Contact:  Baldemar Lenis, LCSW Phone Number: 06/14/2019, 2:41 PM   Clinical Narrative:   Nurse to call report to [phone], Room 127.    Final next level of care: Skilled Nursing Facility Barriers to Discharge: Barriers Resolved   Patient Goals and CMS Choice        Discharge Placement              Patient chooses bed at: [facility]) Patient to be transferred to facility by: PTAR Name of family member notified: Self Patient and family notified of of transfer: 06/14/19  Discharge Plan and Services In-house Referral: Clinical Social Work Discharge Planning Services: CM Consult                                 Social Determinants of Health (SDOH) Interventions     Readmission Risk Interventions No flowsheet data found.

## 2019-08-04 ENCOUNTER — Ambulatory Visit: Payer: Medicaid Other | Admitting: Podiatry

## 2021-06-30 IMAGING — CT CT ANGIO NECK
1 of 7 series · 14 of 46 positions shown, 18 images · IV contrast (OMNI)
Comparison: MRI 12/19/2018

CLINICAL DATA: Follow-up stroke. Left hemisphere watershed
infarctions.

EXAM:
CT ANGIOGRAPHY HEAD AND NECK
TECHNIQUE: Multidetector CT imaging of the head and neck was performed using
the standard protocol during bolus administration of intravenous
contrast. Multiplanar CT image reconstructions and MIPs were
obtained to evaluate the vascular anatomy. Carotid stenosis
measurements (when applicable) are obtained utilizing NASCET
criteria, using the distal internal carotid diameter as the
denominator.
CONTRAST:  100mL OMNIPAQUE IOHEXOL 350 MG/ML SOLN

[Series 12: thin · axial · 0.53mm/px · z∈[-305,+28]mm · 14 of 731 slices shown, 18 images]
[im 32/731  soft-tissue]
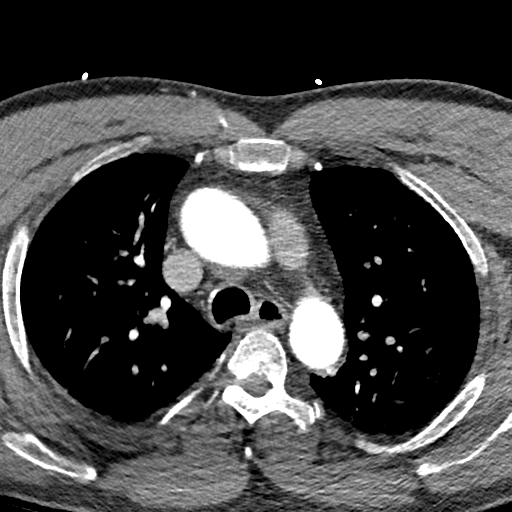
[im 32/731  bone]
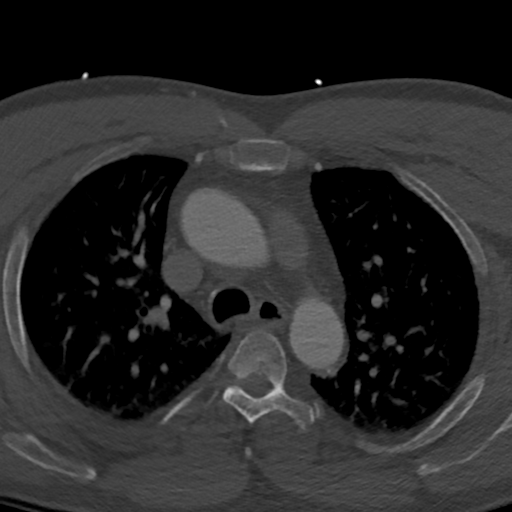
[im 96/731  soft-tissue]
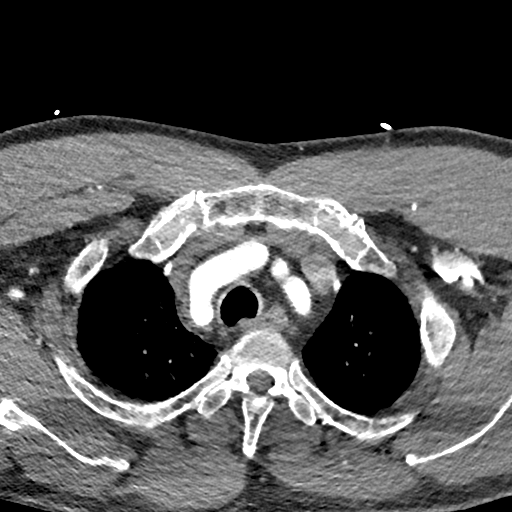
[im 159/731  soft-tissue]
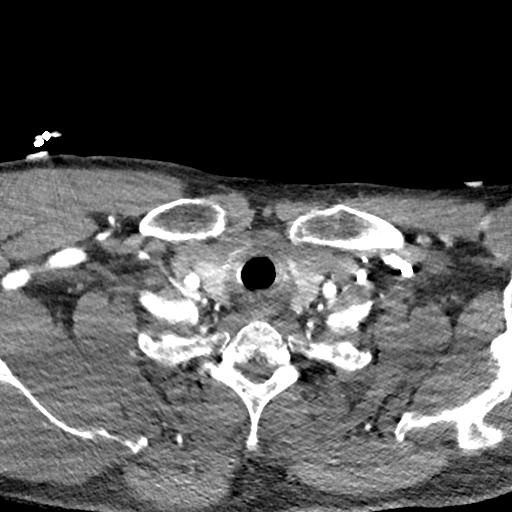
[im 223/731  soft-tissue]
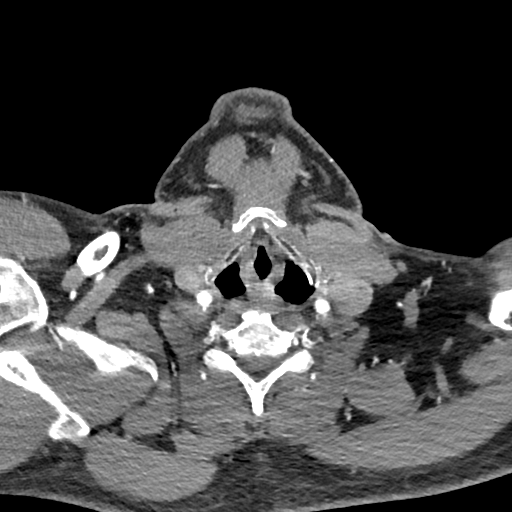
[im 286/731  soft-tissue]
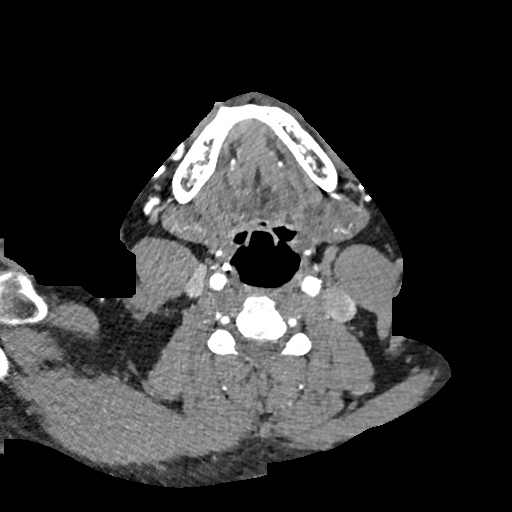
[im 350/731  soft-tissue]
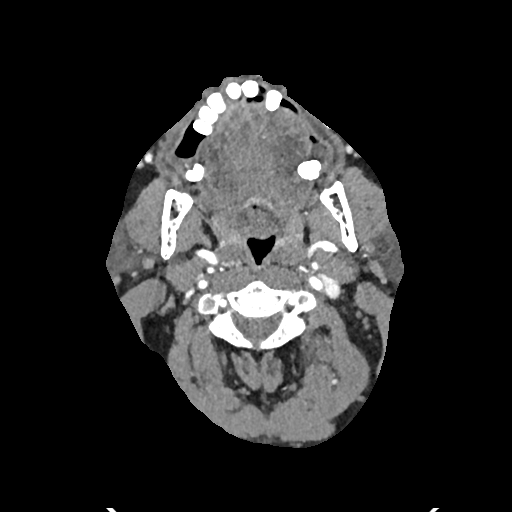
[im 381/731  soft-tissue]
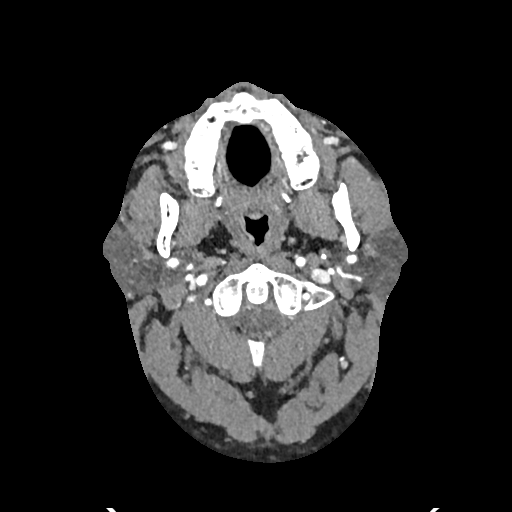
[im 445/731  soft-tissue]
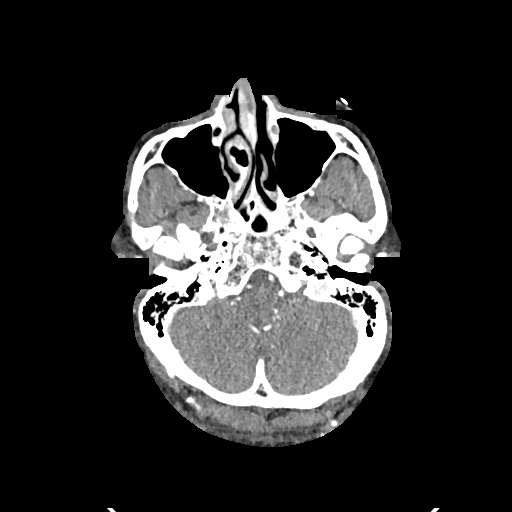
[im 508/731  soft-tissue]
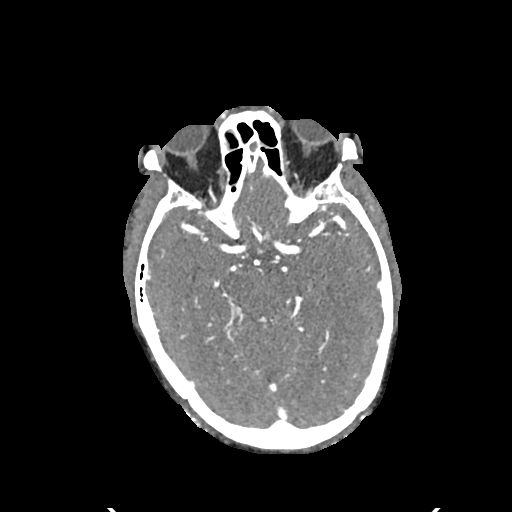
[im 508/731  bone]
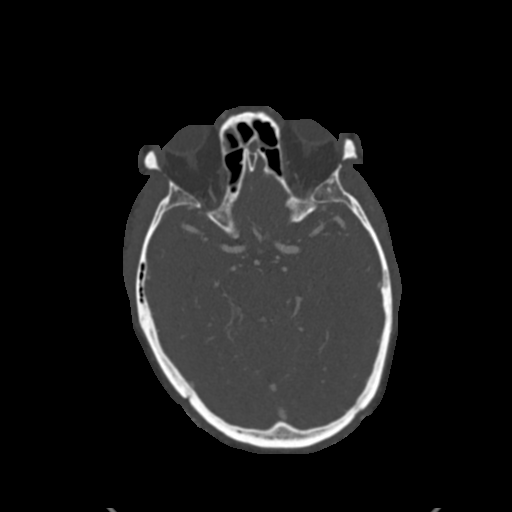
[im 572/731  soft-tissue]
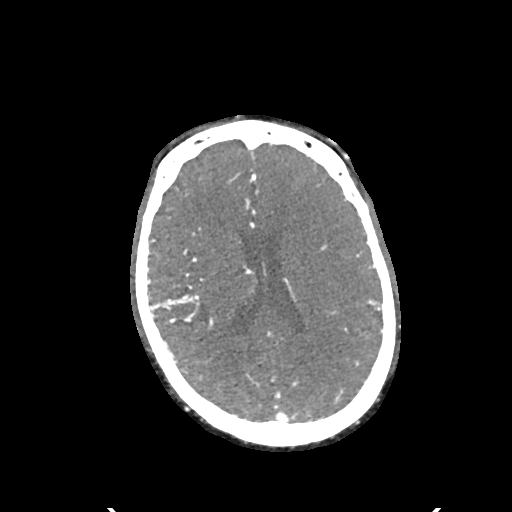
[im 604/731  lung]
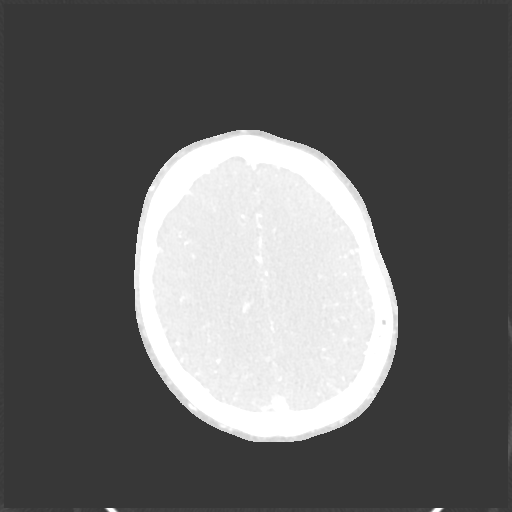
[im 635/731  soft-tissue]
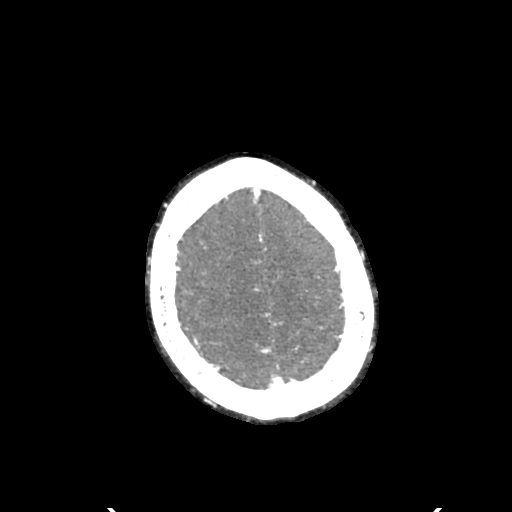
[im 635/731  lung]
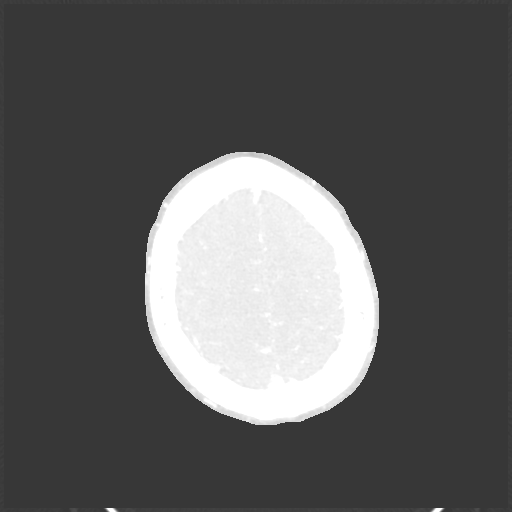
[im 667/731  lung]
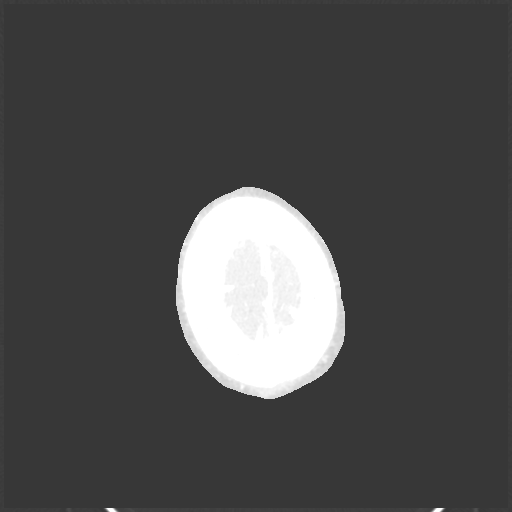
[im 699/731  soft-tissue]
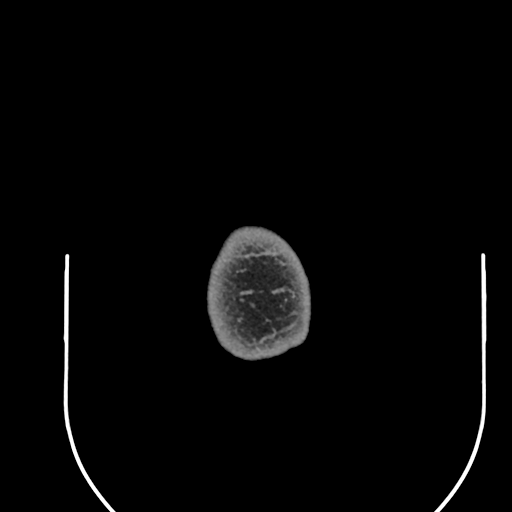
[im 699/731  lung]
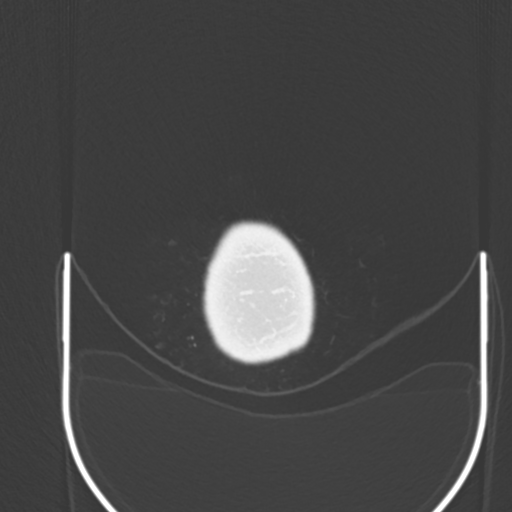

[14 of 46 positions shown; findings below may reference images not displayed]

FINDINGS: CTA NECK FINDINGS

Aortic arch: Mild aortic atherosclerosis. No aneurysm or dissection.
Branching pattern is normal.

Right carotid system: Common carotid artery is widely patent to the
bifurcation. There is calcified plaque at the carotid bifurcation
and proximal ICA bulb but no stenosis compared to the more distal
cervical ICA diameter.

Left carotid system: Common carotid artery is widely patent to the
bifurcation. There is atherosclerotic plaque at the carotid
bifurcation and ICA bulb but no stenosis compared to the more distal
cervical ICA.

Vertebral arteries: Both vertebral artery origins are widely patent.
Both vertebral arteries are widely patent through the cervical
region to the foramen magnum.

Skeleton: Negative

Other neck: No mass or adenopathy.

Upper chest: Normal

Review of the MIP images confirms the above findings

CTA HEAD FINDINGS

Anterior circulation: Both internal carotid arteries are patent
through the skull base and siphon regions. Mild siphon
atherosclerotic calcification but no stenosis. Right anterior and
middle cerebral arteries are patent. Left middle cerebral artery is
patent. Left anterior cerebral artery is occluded 1 cm beyond the
anterior communicating level.

Posterior circulation: Both vertebral arteries are patent at the
foramen magnum. Mild atherosclerotic calcification in the V4
segments but no flow limiting stenosis. Mild atherosclerotic disease
of the basilar but no flow limiting stenosis. Superior cerebellar
and posterior cerebral arteries show flow.

Venous sinuses: Patent and normal.

Anatomic variants: None significant.

Review of the MIP images confirms the above findings
IMPRESSION: Mild atherosclerotic disease at the carotid bifurcation regions but
no stenosis or pronounced irregularity.

Embolic occlusion of the left anterior cerebral artery 1 cm beyond
the anterior communicating level.

## 2022-06-18 ENCOUNTER — Encounter (HOSPITAL_COMMUNITY): Payer: Self-pay

## 2022-06-18 ENCOUNTER — Emergency Department (HOSPITAL_COMMUNITY): Payer: Medicaid Other

## 2022-06-18 ENCOUNTER — Inpatient Hospital Stay (HOSPITAL_COMMUNITY)
Admission: EM | Admit: 2022-06-18 | Discharge: 2022-06-20 | DRG: 683 | Disposition: A | Discharge status: Skilled Nursing Facility | Payer: Medicaid Other | Source: Skilled Nursing Facility | Attending: Internal Medicine | Admitting: Internal Medicine

## 2022-06-18 ENCOUNTER — Other Ambulatory Visit: Payer: Self-pay

## 2022-06-18 DIAGNOSIS — I6930 Unspecified sequelae of cerebral infarction: Secondary | ICD-10-CM | POA: Diagnosis not present

## 2022-06-18 DIAGNOSIS — E860 Dehydration: Secondary | ICD-10-CM | POA: Diagnosis present

## 2022-06-18 DIAGNOSIS — N1831 Chronic kidney disease, stage 3a: Secondary | ICD-10-CM | POA: Diagnosis present

## 2022-06-18 DIAGNOSIS — Z7401 Bed confinement status: Secondary | ICD-10-CM

## 2022-06-18 DIAGNOSIS — N390 Urinary tract infection, site not specified: Secondary | ICD-10-CM | POA: Diagnosis present

## 2022-06-18 DIAGNOSIS — E861 Hypovolemia: Secondary | ICD-10-CM | POA: Diagnosis present

## 2022-06-18 DIAGNOSIS — E872 Acidosis, unspecified: Secondary | ICD-10-CM | POA: Diagnosis present

## 2022-06-18 DIAGNOSIS — I129 Hypertensive chronic kidney disease with stage 1 through stage 4 chronic kidney disease, or unspecified chronic kidney disease: Secondary | ICD-10-CM | POA: Diagnosis present

## 2022-06-18 DIAGNOSIS — N1832 Chronic kidney disease, stage 3b: Secondary | ICD-10-CM | POA: Diagnosis present

## 2022-06-18 DIAGNOSIS — Z993 Dependence on wheelchair: Secondary | ICD-10-CM

## 2022-06-18 DIAGNOSIS — D649 Anemia, unspecified: Secondary | ICD-10-CM | POA: Diagnosis present

## 2022-06-18 DIAGNOSIS — Z5320 Procedure and treatment not carried out because of patient's decision for unspecified reasons: Secondary | ICD-10-CM | POA: Diagnosis present

## 2022-06-18 DIAGNOSIS — I69351 Hemiplegia and hemiparesis following cerebral infarction affecting right dominant side: Secondary | ICD-10-CM

## 2022-06-18 DIAGNOSIS — N189 Chronic kidney disease, unspecified: Secondary | ICD-10-CM | POA: Diagnosis present

## 2022-06-18 DIAGNOSIS — I959 Hypotension, unspecified: Secondary | ICD-10-CM | POA: Diagnosis present

## 2022-06-18 DIAGNOSIS — N3001 Acute cystitis with hematuria: Secondary | ICD-10-CM | POA: Diagnosis not present

## 2022-06-18 DIAGNOSIS — Z7982 Long term (current) use of aspirin: Secondary | ICD-10-CM | POA: Diagnosis not present

## 2022-06-18 DIAGNOSIS — N179 Acute kidney failure, unspecified: Principal | ICD-10-CM | POA: Diagnosis present

## 2022-06-18 DIAGNOSIS — Z79899 Other long term (current) drug therapy: Secondary | ICD-10-CM | POA: Diagnosis not present

## 2022-06-18 HISTORY — DX: Unspecified sequelae of cerebral infarction: I69.30

## 2022-06-18 LAB — BRAIN NATRIURETIC PEPTIDE: B Natriuretic Peptide: 14.6 pg/mL (ref 0.0–100.0)

## 2022-06-18 LAB — URINALYSIS, ROUTINE W REFLEX MICROSCOPIC
Bilirubin Urine: NEGATIVE
Glucose, UA: NEGATIVE mg/dL
Ketones, ur: 5 mg/dL — AB
Nitrite: NEGATIVE
Protein, ur: 100 mg/dL — AB
Specific Gravity, Urine: 1.013 (ref 1.005–1.030)
pH: 5 (ref 5.0–8.0)

## 2022-06-18 LAB — COMPREHENSIVE METABOLIC PANEL
ALT: 16 U/L (ref 0–44)
AST: 13 U/L — ABNORMAL LOW (ref 15–41)
Albumin: 2.9 g/dL — ABNORMAL LOW (ref 3.5–5.0)
Alkaline Phosphatase: 77 U/L (ref 38–126)
Anion gap: 18 — ABNORMAL HIGH (ref 5–15)
BUN: 150 mg/dL — ABNORMAL HIGH (ref 8–23)
CO2: 19 mmol/L — ABNORMAL LOW (ref 22–32)
Calcium: 8.9 mg/dL (ref 8.9–10.3)
Chloride: 97 mmol/L — ABNORMAL LOW (ref 98–111)
Creatinine, Ser: 10.88 mg/dL — ABNORMAL HIGH (ref 0.61–1.24)
GFR, Estimated: 5 mL/min — ABNORMAL LOW (ref >60)
Glucose, Bld: 100 mg/dL — ABNORMAL HIGH (ref 70–99)
Potassium: 4.1 mmol/L (ref 3.5–5.1)
Sodium: 134 mmol/L — ABNORMAL LOW (ref 135–145)
Total Bilirubin: 0.5 mg/dL (ref 0.3–1.2)
Total Protein: 6.3 g/dL — ABNORMAL LOW (ref 6.5–8.1)

## 2022-06-18 LAB — CBC WITH DIFFERENTIAL/PLATELET
Abs Immature Granulocytes: 0.02 10*3/uL (ref 0.00–0.07)
Basophils Absolute: 0 10*3/uL (ref 0.0–0.1)
Basophils Relative: 1 %
Eosinophils Absolute: 0.1 10*3/uL (ref 0.0–0.5)
Eosinophils Relative: 1 %
HCT: 36.1 % — ABNORMAL LOW (ref 39.0–52.0)
Hemoglobin: 11.4 g/dL — ABNORMAL LOW (ref 13.0–17.0)
Immature Granulocytes: 0 %
Lymphocytes Relative: 16 %
Lymphs Abs: 1 10*3/uL (ref 0.7–4.0)
MCH: 27.1 pg (ref 26.0–34.0)
MCHC: 31.6 g/dL (ref 30.0–36.0)
MCV: 85.7 fL (ref 80.0–100.0)
Monocytes Absolute: 0.5 10*3/uL (ref 0.1–1.0)
Monocytes Relative: 7 %
Neutro Abs: 5 10*3/uL (ref 1.7–7.7)
Neutrophils Relative %: 75 %
Platelets: 233 10*3/uL (ref 150–400)
RBC: 4.21 MIL/uL — ABNORMAL LOW (ref 4.22–5.81)
RDW: 15.9 % — ABNORMAL HIGH (ref 11.5–15.5)
WBC: 6.6 10*3/uL (ref 4.0–10.5)
nRBC: 0 % (ref 0.0–0.2)

## 2022-06-18 LAB — CK: Total CK: 120 U/L (ref 49–397)

## 2022-06-18 LAB — LIPASE, BLOOD: Lipase: 75 U/L — ABNORMAL HIGH (ref 11–51)

## 2022-06-18 LAB — LACTIC ACID, PLASMA: Lactic Acid, Venous: 1.2 mmol/L (ref 0.5–1.9)

## 2022-06-18 MED ORDER — LACTATED RINGERS IV BOLUS
1000.0000 mL | Freq: Once | INTRAVENOUS | Status: AC
  Administered 2022-06-18: 1000 mL via INTRAVENOUS

## 2022-06-18 MED ORDER — ACETAMINOPHEN 325 MG PO TABS: 650.0000 mg | ORAL_TABLET | Freq: Four times a day (QID) | ORAL | Status: DC | PRN

## 2022-06-18 MED ORDER — TRIMETHOBENZAMIDE HCL 100 MG/ML IM SOLN: 200.0000 mg | Freq: Four times a day (QID) | INTRAMUSCULAR | Status: DC | PRN

## 2022-06-18 MED ORDER — SODIUM CHLORIDE 0.9 % IV SOLN
1.0000 g | INTRAVENOUS | Status: DC
  Administered 2022-06-19: 1 g via INTRAVENOUS
  Filled 2022-06-18: qty 10

## 2022-06-18 MED ORDER — SODIUM BICARBONATE 8.4 % IV SOLN
50.0000 meq | Freq: Once | INTRAVENOUS | Status: AC
  Administered 2022-06-19: 50 meq via INTRAVENOUS
  Filled 2022-06-18: qty 50

## 2022-06-18 MED ORDER — HEPARIN SODIUM (PORCINE) 5000 UNIT/ML IJ SOLN
5000.0000 [IU] | Freq: Three times a day (TID) | INTRAMUSCULAR | Status: DC
  Administered 2022-06-19 (×3): 5000 [IU] via SUBCUTANEOUS
  Filled 2022-06-18 (×4): qty 1

## 2022-06-18 MED ORDER — ONDANSETRON HCL 4 MG/2ML IJ SOLN: 4.0000 mg | Freq: Four times a day (QID) | INTRAMUSCULAR | Status: DC | PRN

## 2022-06-18 MED ORDER — SODIUM CHLORIDE 0.9 % IV BOLUS
1000.0000 mL | Freq: Once | INTRAVENOUS | Status: AC
  Administered 2022-06-18: 1000 mL via INTRAVENOUS

## 2022-06-18 MED ORDER — ONDANSETRON HCL 4 MG PO TABS: 4.0000 mg | ORAL_TABLET | Freq: Four times a day (QID) | ORAL | Status: DC | PRN

## 2022-06-18 MED ORDER — SODIUM CHLORIDE 0.9 % IV SOLN: INTRAVENOUS | Status: DC

## 2022-06-18 MED ORDER — SODIUM CHLORIDE 0.9% FLUSH
3.0000 mL | Freq: Two times a day (BID) | INTRAVENOUS | Status: DC
  Administered 2022-06-19 – 2022-06-20 (×2): 3 mL via INTRAVENOUS

## 2022-06-18 MED ORDER — ACETAMINOPHEN 650 MG RE SUPP: 650.0000 mg | Freq: Four times a day (QID) | RECTAL | Status: DC | PRN

## 2022-06-18 MED ORDER — ALBUTEROL SULFATE (2.5 MG/3ML) 0.083% IN NEBU: 2.5000 mg | INHALATION_SOLUTION | Freq: Four times a day (QID) | RESPIRATORY_TRACT | Status: DC | PRN

## 2022-06-18 NOTE — H&P (Signed)
History and Physical    Patient: Philip Vasquez [id number] DOB: 18-May-1960 DOA: 06/18/2022 DOS: the patient was seen and examined on 06/18/2022 PCP: Patient, No Pcp Per  Patient coming from: Transfer from [place]  Chief Complaint:  Chief Complaint  Patient presents with   Hypotension   HPI: [name] is a 62 y.o. male with medical history significant of hypertension, CVA with residual right-sided weakness, GI bleed, depression, and prior polysubstance abuse who presented due to low blood pressures and abnormal labs at his facility.  Patient had recently had outpatient lab work done the other day which noted BUN elevated at 127 with creatinine 9.72 with elevated anion gap metabolic acidosis.  Blood pressures were noted to be low and he is on antihypertensive medications at baseline.  He reports still being able to make urine and had complained of some flank pain.  Patient does state that he has not been eating or drinking much.  Denied having any nausea, vomiting, dysuria, or diarrhea symptoms.  At baseline patient is wheelchair-bound due to prior stroke.  In route with EMS patient's blood pressures were noted to be in the 70s.  He had been given 500 mL bolus prior to arrival with blood pressure reported to be 70/40.  In the emergency department patient blood pressure was noted to be 83/54-99/67, with all other vital signs maintained.  Labs significant for hemoglobin 11.4, sodium 134, CO2 19, BUN 150, and creatinine 10.88.  Chest x-ray noted low lung volumes. Urinalysis noted trace leukocytes, rare bacteria, and 6-10 WBCs.  Renal ultrasound was unable to visualize the right kidney and patient was noted to have a slightly increased echogenic left kidney without hydronephrosis or nephrolithiasis appreciated.  Patient had initially been bolused 3 L of normal saline IV fluids.  Review of Systems: As mentioned in the history of present illness. All other systems reviewed and are  negative. Past Medical History:  Diagnosis Date   Cocaine use disorder ([place]) 12/21/2018   History reviewed. No pertinent surgical history. Social History:  reports that he has never smoked. He has never used smokeless tobacco. He reports current alcohol use. He reports that he does not use drugs.  No Known Allergies  History reviewed. No pertinent family history.  Prior to Admission medications   Medication Sig Start Date End Date Taking? Authorizing Provider  Amino Acids-Protein Hydrolys (FEEDING SUPPLEMENT, PRO-STAT SUGAR FREE 64,) LIQD Take 30 mLs by mouth 2 (two) times daily. 06/14/19   Mitzi Hansen, MD  aspirin EC 81 MG EC tablet Take 1 tablet (81 mg total) by mouth daily. 06/15/19   Mitzi Hansen, MD  baclofen 5 MG TABS Take 5 mg by mouth 2 (two) times daily as needed for muscle spasms. 06/14/19   Mitzi Hansen, MD  feeding supplement, ENSURE ENLIVE, (ENSURE ENLIVE) LIQD Take 237 mLs by mouth daily in the afternoon. 06/14/19   Mitzi Hansen, MD  FLUoxetine (PROZAC) 40 MG capsule Take 2 capsules (80 mg total) by mouth daily. 06/15/19   Mitzi Hansen, MD  folic acid (FOLVITE) 1 MG tablet Take 1 tablet (1 mg total) by mouth daily. 06/15/19   Mitzi Hansen, MD  hydrALAZINE (APRESOLINE) 25 MG tablet Take 1 tablet (25 mg total) by mouth 3 (three) times daily. 06/14/19   Mitzi Hansen, MD  hydrocerin (EUCERIN) CREA Apply 1 application topically 2 (two) times daily. 06/14/19   Mitzi Hansen, MD  Hydrocortisone (GERHARDT'S BUTT CREAM) CREA Apply 1 application topically 2 (two) times daily. 06/14/19   Christian,  Rylee, MD  loratadine (CLARITIN) 10 MG tablet Take 1 tablet (10 mg total) by mouth daily. 06/15/19   Mitzi Hansen, MD  metoprolol tartrate (LOPRESSOR) 25 MG tablet Take 1 tablet (25 mg total) by mouth 2 (two) times daily. 06/14/19   Mitzi Hansen, MD  mirtazapine (REMERON) 15 MG tablet Take 1 tablet (15 mg total) by mouth at bedtime. 06/14/19   Mitzi Hansen, MD  Multiple  Vitamin (MULTIVITAMIN WITH MINERALS) TABS tablet Take 1 tablet by mouth daily. 06/15/19   Mitzi Hansen, MD  pantoprazole (PROTONIX) 40 MG tablet Take 1 tablet (40 mg total) by mouth daily. 06/15/19   Christian, Rylee, MD  polyethylene glycol (MIRALAX / GLYCOLAX) 17 g packet Take 17 g by mouth daily. 06/15/19   Mitzi Hansen, MD  rosuvastatin (CRESTOR) 40 MG tablet Take 1 tablet (40 mg total) by mouth daily at 6 PM. 06/14/19   Christian, Rylee, MD  senna-docusate (SENOKOT-S) 8.6-50 MG tablet Take 2 tablets by mouth at bedtime. 06/14/19   Mitzi Hansen, MD  thiamine 100 MG tablet Take 1 tablet (100 mg total) by mouth daily. 06/15/19   Mitzi Hansen, MD    Physical Exam: Vitals:   06/18/22 1231 06/18/22 1330 06/18/22 1415  BP: (!) 83/54 (!) 86/53 (!) 95/57  Pulse: 85 81 78  Resp: 18 (!) 21 (!) 24  Temp: 97.7 F (36.5 C)    TempSrc: Oral    SpO2: 100% 100% 100%    Constitutional: Older adult male who appears to be in no acute distress at this time. Eyes: PERRL, lids and conjunctivae normal ENMT: Mucous membranes are moist.   Neck: normal, supple  Respiratory: clear to auscultation bilaterally, no wheezing, no crackles. Normal respiratory effort.   Cardiovascular: Regular rate and rhythm.  2+ pedal pulses. No carotid bruits.  Abdomen: no tenderness, no masses palpated. Bowel sounds positive.  Musculoskeletal: no clubbing / cyanosis. No joint deformity upper and lower extremities. Good ROM, no contractures. Normal muscle tone.  Skin: no rashes, lesions, ulcers. No induration Neurologic: CN 2-12 grossly intact. Strength 5/5 in all 4.  Psychiatric: Normal judgment and insight. Alert and oriented x 3. Normal mood.   Data Reviewed:  EKG reveals sinus rhythm 82 bpm with QTc 505 with a right bundle branch. Reviewed labs, imaging, and pertinent records as noted above in HPI.  Assessment and Plan:  Acute kidney injury superimposed on chronic kidney disease stage IIIa suspected secondary to  dehydration Patient creatinine elevated up to 10.88 with BUN 150.  Creatinine previoursly had been 1.5. Patient has been bolused at least 3 L of normal saline IV fluids while in the ED.  Renal ultrasound did not visualize the right kidney, noted slightly increased echogenicity of the left kidney, and no signs of obstruction. -Admit to a medical surgical bed -Strict I&Os -Check urine sodium and urine creatinine -Check CK and BNP -Avoid nephrotoxic agents -Continue normal saline IV fluids at 100 mL/h -Continue to monitor kidney function daily -Dr. Posey Pronto of nephrology notified, and they will evaluate in a.m.  Transient hypotension Acute.  Patient's initial blood pressures were 83/54, but improved after IV fluids. -Goal MAP at least 65 -Hold home blood pressure regimen -Adjust IV fluids as needed  Metabolic acidosis with elevated anion gap CO2 was noted to be 19 with anion gap of 18.  Suspect metabolic acidosis with elevated anion gap secondary to uremia -Given amp of sodium bicarb -Continue to monitor   Possible urinary tract infection Acute.  Urinalysis noted small hemoglobin,  trace leukocytes, rare bacteria, and 6-10 WBCs. -Check urine culture -Rocephin  IV  Normocytic anemia Chronic. Hemoglobin noted to be 11.4 g/dL on admission which appears around patient's baseline. -Recheck CBC tomorrow morning  History of CVA with residual deficit Patient has residual right-sided weakness due to watershed infarct back in 2020 thought  related to patient's cocaine use at that time.  He is wheelchair-bound.    DVT prophylaxis: SCDs.  Consider placing on heparin once able to verify patient's hemoglobin is stable Advance Care Planning:   Code Status: Full Code    Consults: Nephrology  Family Communication:  none  Severity of Illness: The appropriate patient status for this patient is INPATIENT. Inpatient status is judged to be reasonable and necessary in order to provide the required  intensity of service to ensure the patient's safety. The patient's presenting symptoms, physical exam findings, and initial radiographic and laboratory data in the context of their chronic comorbidities is felt to place them at high risk for further clinical deterioration. Furthermore, it is not anticipated that the patient will be medically stable for discharge from the hospital within 2 midnights of admission.   * I certify that at the point of admission it is my clinical judgment that the patient will require inpatient hospital care spanning beyond 2 midnights from the point of admission due to high intensity of service, high risk for further deterioration and high frequency of surveillance required.*  Author: Norval Morton, MD 06/18/2022 3:26 PM  For on call review [email or web address].

## 2022-06-18 NOTE — ED Triage Notes (Signed)
From [facility]. Routine labwork showed elevated BUN and creatinine. Patient reports occasional flank pain. Patient also has had increased weakness over the last month. Per EMS patient's SBP in 70's. 70/40 after 551m bolus.

## 2022-06-18 NOTE — ED Notes (Signed)
ED TO INPATIENT HANDOFF REPORT  ED Nurse Name and Phone #: Randall Hiss [phone]  S Name/Age/Gender Philip Vasquez 62 y.o. male Room/Bed: 005C/005C  Code Status   Code Status: Prior  Home/SNF/Other Home Patient oriented to: self Is this baseline? Yes   Triage Complete: Triage complete  Chief Complaint ARF (acute renal failure) ([place]) [N17.9]  Triage Note From [facility]. Routine labwork showed elevated BUN and creatinine. Patient reports occasional flank pain. Patient also has had increased weakness over the last month. Per EMS patient's SBP in 70's. 70/40 after 538m bolus.   Allergies No Known Allergies  Level of Care/Admitting Diagnosis ED Disposition     ED Disposition  Admit   Condition  --   Comment  Hospital Area: [facility][[id number]]  Level of Care: Telemetry Medical [104]  May admit patient to [facility] equivalent level of care is available:: No  Covid Evaluation: Asymptomatic - no recent exposure (last 10 days) testing not required  Diagnosis: ARF (acute renal failure) ([facility] [[id number] Admitting Physician: SNorval Morton[[id number] Attending Physician: SNorval Morton[[id number] Certification:: I certify this patient will need inpatient services for at least 2 midnights          B Medical/Surgery History Past Medical History:  Diagnosis Date   Cocaine use disorder ([place] 12/21/2018   History of CVA with residual deficit    History reviewed. No pertinent surgical history.   A IV Location/Drains/Wounds Patient Lines/Drains/Airways Status     Active Line/Drains/Airways     Name Placement date Placement time Site Days   Peripheral IV 06/18/22 18 G Left Antecubital 06/18/22  --  Antecubital  less than 1   External Urinary Catheter 06/18/22  1334  --  less than 1            Intake/Output Last 24 hours No intake or output data in the 24 hours ending 06/18/22 1958  Labs/Imaging Results for orders placed or  performed during the hospital encounter of 06/18/22 (from the past 48 hour(s))  Lactic acid, plasma     Status: None   Collection Time: 06/18/22 12:55 PM  Result Value Ref Range   Lactic Acid, Venous 1.2 0.5 - 1.9 mmol/L    Comment: Performed at [facility] [address] [place] [address] Comprehensive metabolic panel     Status: Abnormal   Collection Time: 06/18/22 12:55 PM  Result Value Ref Range   Sodium 134 (L) 135 - 145 mmol/L   Potassium 4.1 3.5 - 5.1 mmol/L   Chloride 97 (L) 98 - 111 mmol/L   CO2 19 (L) 22 - 32 mmol/L   Glucose, Bld 100 (H) 70 - 99 mg/dL    Comment: Glucose reference range applies only to samples taken after fasting for at least 8 hours.   BUN 150 (H) 8 - 23 mg/dL   Creatinine, Ser 10.88 (H) 0.61 - 1.24 mg/dL   Calcium 8.9 8.9 - 10.3 mg/dL   Total Protein 6.3 (L) 6.5 - 8.1 g/dL   Albumin 2.9 (L) 3.5 - 5.0 g/dL   AST 13 (L) 15 - 41 U/L   ALT 16 0 - 44 U/L   Alkaline Phosphatase 77 38 - 126 U/L   Total Bilirubin 0.5 0.3 - 1.2 mg/dL   GFR, Estimated 5 (L) >60 mL/min    Comment: (NOTE) Calculated using the CKD-EPI Creatinine Equation (2021)    Anion gap 18 (H) 5 -  15    Comment: Performed at [facility], [place] [address] [place], [place] [address]  CBC with Differential     Status: Abnormal   Collection Time: 06/18/22 12:55 PM  Result Value Ref Range   WBC 6.6 4.0 - 10.5 K/uL   RBC 4.21 (L) 4.22 - 5.81 MIL/uL   Hemoglobin 11.4 (L) 13.0 - 17.0 g/dL   HCT 36.1 (L) 39.0 - 52.0 %   MCV 85.7 80.0 - 100.0 fL   MCH 27.1 26.0 - 34.0 pg   MCHC 31.6 30.0 - 36.0 g/dL   RDW 15.9 (H) 11.5 - 15.5 %   Platelets 233 150 - 400 K/uL   nRBC 0.0 0.0 - 0.2 %   Neutrophils Relative % 75 %   Neutro Abs 5.0 1.7 - 7.7 K/uL   Lymphocytes Relative 16 %   Lymphs Abs 1.0 0.7 - 4.0 K/uL   Monocytes Relative 7 %   Monocytes Absolute 0.5 0.1 - 1.0 K/uL   Eosinophils Relative 1 %   Eosinophils Absolute 0.1 0.0 - 0.5 K/uL   Basophils Relative 1 %    Basophils Absolute 0.0 0.0 - 0.1 K/uL   Immature Granulocytes 0 %   Abs Immature Granulocytes 0.02 0.00 - 0.07 K/uL    Comment: Performed at [facility] [address], [place], [place] [address]  Lipase, blood     Status: Abnormal   Collection Time: 06/18/22 12:55 PM  Result Value Ref Range   Lipase 75 (H) 11 - 51 U/L    Comment: Performed at [facility] [address] [place], [place] [address]  Brain natriuretic peptide     Status: None   Collection Time: 06/18/22 12:55 PM  Result Value Ref Range   B Natriuretic Peptide 14.6 0.0 - 100.0 pg/mL    Comment: Performed at [facility] [address] [place], [place] [address]  CK     Status: None   Collection Time: 06/18/22 12:55 PM  Result Value Ref Range   Total CK 120 49 - 397 U/L    Comment: Performed at [facility], [place] [address], [place], [place] [address]  Urinalysis, Routine w reflex microscopic -Urine, Clean Catch     Status: Abnormal   Collection Time: 06/18/22  2:48 PM  Result Value Ref Range   Color, Urine YELLOW YELLOW   APPearance CLOUDY (A) CLEAR   Specific Gravity, Urine 1.013 1.005 - 1.030   pH 5.0 5.0 - 8.0   Glucose, UA NEGATIVE NEGATIVE mg/dL   Hgb urine dipstick SMALL (A) NEGATIVE   Bilirubin Urine NEGATIVE NEGATIVE   Ketones, ur 5 (A) NEGATIVE mg/dL   Protein, ur 100 (A) NEGATIVE mg/dL   Nitrite NEGATIVE NEGATIVE   Leukocytes,Ua TRACE (A) NEGATIVE   RBC / HPF 0-5 0 - 5 RBC/hpf   WBC, UA 6-10 0 - 5 WBC/hpf   Bacteria, UA RARE (A) NONE SEEN   Squamous Epithelial / HPF 0-5 0 - 5 /HPF   Mucus PRESENT     Comment: Performed at [facility], [address], [place], [place] [address]   US Renal  Result Date: 06/18/2022 CLINICAL DATA:  Acute kidney injury EXAM: RENAL / URINARY TRACT ULTRASOUND COMPLETE COMPARISON:  CT AP 01/18/19 FINDINGS: Right Kidney: Right kidney was not visualized. Left Kidney: Renal measurements: 11.0 x 5.8 x 5.1 cm = volume: 171 mL. Echogenicity is  slightly increased. No mass or hydronephrosis visualized. Bladder: Appears  normal for degree of bladder distention. Other: None. IMPRESSION: 1. Right kidney was not visualized. 2. Slightly increased echogenicity of the left kidney may be seen in medical renal disease. No hydronephrosis or nephrolithiasis. Electronically Signed   By: Marin Roberts M.D.   On: 06/18/2022 15:29   DG Chest Port 1 View  Result Date: 06/18/2022 CLINICAL DATA:  Sepsis EXAM: PORTABLE CHEST 1 VIEW COMPARISON:  CXR 03/29/19 FINDINGS: No pleural effusion. No pneumothorax. Low lung volumes. Unchanged cardiac and mediastinal contours. No focal airspace opacity. No radiographically apparent displaced rib fractures. Visualized upper abdomen is unremarkable. IMPRESSION: Low lung volumes. No acute cardiopulmonary disease. Electronically Signed   By: Marin Roberts M.D.   On: 06/18/2022 14:28    Pending Labs Unresulted Labs (From admission, onward)     Start     Ordered   06/18/22 1245  Lactic acid, plasma  (Septic presentation on arrival (screening labs, nursing and treatment orders for obvious sepsis))  Now then every 2 hours,   R (with STAT occurrences)      06/18/22 1246            Vitals/Pain Today's Vitals   06/18/22 1845 06/18/22 1930 06/18/22 1955 06/18/22 1957  BP: 99/67 106/64    Pulse: 81 85    Resp: (!) 25 (!) 25    Temp:      TempSrc:      SpO2: 100% 100%    Weight:   81.6 kg   Height:   '5\' 9"'$  (1.753 m)   PainSc:    0-No pain    Isolation Precautions No active isolations  Medications Medications  0.9 %  sodium chloride infusion ( Intravenous New Bag/Given 06/18/22 1546)  lactated ringers bolus 1,000 mL (0 mLs Intravenous Stopped 06/18/22 1352)  lactated ringers bolus 1,000 mL (0 mLs Intravenous Stopped 06/18/22 1446)  lactated ringers bolus 1,000 mL (0 mLs Intravenous Stopped 06/18/22 1546)  sodium chloride 0.9 % bolus 1,000 mL (1,000 mLs Intravenous New Bag/Given 06/18/22 1932)  lactated ringers bolus  1,000 mL (1,000 mLs Intravenous New Bag/Given 06/18/22 1933)    Mobility non-ambulatory     Focused Assessments Cardiac Assessment Handoff:    Lab Results  Component Value Date   CKTOTAL 120 06/18/2022   No results found for: "DDIMER" Does the Patient currently have chest pain? No    R Recommendations: See Admitting Provider Note  Report given to:   Additional Notes: non ambulatory, lt arm flaccid prior stroke?

## 2022-06-18 NOTE — ED Provider Notes (Signed)
[facility] Provider Note   CSN: [id number] Arrival date & time: 06/18/22  1228     History  Chief Complaint  Patient presents with   Hypotension    Erek Foth is a 62 y.o. male.  62 year old male with history of stroke (watershed infarct) cocaine use, hypertension, GI bleed, depression, and urinary retention who presents emergency department with abnormal renal function and low blood pressure.  Patient had outpatient labs that were performed on 06/17/2022 which showed a BUN of 127, creatinine of 9.7 and anion gap metabolic acidosis.  This facility was called about the results and they transferred the patient into the emergency department for further evaluation.  Was also noted to be hypotensive at his facility.  They do not believe that he has any history of low blood pressures and is actually prescribed antihypertensives including lisinopril 10 mg daily.  Patient denies any symptoms at this time but did state that he had flank pain several days ago.  His facility says that he had an episode of vomiting.  They deny any melena, fever, dysuria or frequency.  He denies any urinary retention lightheadedness, chest pain.  [place] can be contacted at [phone].       Home Medications Prior to Admission medications   Medication Sig Start Date End Date Taking? Authorizing Provider  Amino Acids-Protein Hydrolys (FEEDING SUPPLEMENT, PRO-STAT SUGAR FREE 64,) LIQD Take 30 mLs by mouth 2 (two) times daily. 06/14/19   Mitzi Hansen, MD  aspirin EC 81 MG EC tablet Take 1 tablet (81 mg total) by mouth daily. 06/15/19   Mitzi Hansen, MD  baclofen 5 MG TABS Take 5 mg by mouth 2 (two) times daily as needed for muscle spasms. 06/14/19   Mitzi Hansen, MD  feeding supplement, ENSURE ENLIVE, (ENSURE ENLIVE) LIQD Take 237 mLs by mouth daily in the afternoon. 06/14/19   Mitzi Hansen, MD  FLUoxetine (PROZAC) 40 MG capsule Take 2 capsules (80 mg  total) by mouth daily. 06/15/19   Mitzi Hansen, MD  folic acid (FOLVITE) 1 MG tablet Take 1 tablet (1 mg total) by mouth daily. 06/15/19   Mitzi Hansen, MD  hydrALAZINE (APRESOLINE) 25 MG tablet Take 1 tablet (25 mg total) by mouth 3 (three) times daily. 06/14/19   Mitzi Hansen, MD  hydrocerin (EUCERIN) CREA Apply 1 application topically 2 (two) times daily. 06/14/19   Mitzi Hansen, MD  Hydrocortisone (GERHARDT'S BUTT CREAM) CREA Apply 1 application topically 2 (two) times daily. 06/14/19   Mitzi Hansen, MD  loratadine (CLARITIN) 10 MG tablet Take 1 tablet (10 mg total) by mouth daily. 06/15/19   Mitzi Hansen, MD  metoprolol tartrate (LOPRESSOR) 25 MG tablet Take 1 tablet (25 mg total) by mouth 2 (two) times daily. 06/14/19   Mitzi Hansen, MD  mirtazapine (REMERON) 15 MG tablet Take 1 tablet (15 mg total) by mouth at bedtime. 06/14/19   Mitzi Hansen, MD  Multiple Vitamin (MULTIVITAMIN WITH MINERALS) TABS tablet Take 1 tablet by mouth daily. 06/15/19   Mitzi Hansen, MD  pantoprazole (PROTONIX) 40 MG tablet Take 1 tablet (40 mg total) by mouth daily. 06/15/19   Christian, Rylee, MD  polyethylene glycol (MIRALAX / GLYCOLAX) 17 g packet Take 17 g by mouth daily. 06/15/19   Mitzi Hansen, MD  rosuvastatin (CRESTOR) 40 MG tablet Take 1 tablet (40 mg total) by mouth daily at 6 PM. 06/14/19   Christian, Rylee, MD  senna-docusate (SENOKOT-S) 8.6-50 MG tablet Take 2 tablets by mouth  at bedtime. 06/14/19   Mitzi Hansen, MD  thiamine 100 MG tablet Take 1 tablet (100 mg total) by mouth daily. 06/15/19   Mitzi Hansen, MD      Allergies    Patient has no known allergies.    Review of Systems   Review of Systems  Physical Exam Updated Vital Signs BP (!) 95/57   Pulse 78   Temp 97.7 F (36.5 C) (Oral)   Resp (!) 24   SpO2 100%  Physical Exam Vitals and nursing note reviewed.  Constitutional:      General: He is not in acute distress.    Appearance: He is well-developed.  HENT:      Head: Normocephalic and atraumatic.     Right Ear: External ear normal.     Left Ear: External ear normal.     Nose: Nose normal.  Eyes:     Extraocular Movements: Extraocular movements intact.     Conjunctiva/sclera: Conjunctivae normal.     Pupils: Pupils are equal, round, and reactive to light.  Cardiovascular:     Rate and Rhythm: Normal rate and regular rhythm.     Heart sounds: Normal heart sounds.  Pulmonary:     Effort: Pulmonary effort is normal. No respiratory distress.     Breath sounds: Normal breath sounds.  Abdominal:     General: There is no distension.     Palpations: Abdomen is soft. There is no mass.     Tenderness: There is no abdominal tenderness. There is no guarding.     Comments: No suprapubic fullness noted  Musculoskeletal:     Cervical back: Normal range of motion and neck supple.     Right lower leg: Edema (1+) present.     Left lower leg: Edema (1+) present.  Skin:    General: Skin is warm and dry.  Neurological:     Mental Status: He is alert. Mental status is at baseline.     ED Results / Procedures / Treatments   Labs (all labs ordered are listed, but only abnormal results are displayed) Labs Reviewed  COMPREHENSIVE METABOLIC PANEL - Abnormal; Notable for the following components:      Result Value   Sodium 134 (*)    Chloride 97 (*)    CO2 19 (*)    Glucose, Bld 100 (*)    BUN 150 (*)    Creatinine, Ser 10.88 (*)    Total Protein 6.3 (*)    Albumin 2.9 (*)    AST 13 (*)    GFR, Estimated 5 (*)    Anion gap 18 (*)    All other components within normal limits  CBC WITH DIFFERENTIAL/PLATELET - Abnormal; Notable for the following components:   RBC 4.21 (*)    Hemoglobin 11.4 (*)    HCT 36.1 (*)    RDW 15.9 (*)    All other components within normal limits  URINALYSIS, ROUTINE W REFLEX MICROSCOPIC - Abnormal; Notable for the following components:   APPearance CLOUDY (*)    Hgb urine dipstick SMALL (*)    Ketones, ur 5 (*)     Protein, ur 100 (*)    Leukocytes,Ua TRACE (*)    Bacteria, UA RARE (*)    All other components within normal limits  LIPASE, BLOOD - Abnormal; Notable for the following components:   Lipase 75 (*)    All other components within normal limits  LACTIC ACID, PLASMA  BRAIN NATRIURETIC PEPTIDE  CK  LACTIC ACID, PLASMA  EKG EKG Interpretation  Date/Time:  Tuesday June 18 2022 12:35:47 EDT Ventricular Rate:  82 PR Interval:  161 QRS Duration: 143 QT Interval:  432 QTC Calculation: 505 R Axis:   145 Text Interpretation: Sinus rhythm Consider left atrial enlargement Right bundle branch block Confirmed by Margaretmary Eddy [phone]) on 06/18/2022 1:30:01 PM  Radiology US Renal  Result Date: 06/18/2022 CLINICAL DATA:  Acute kidney injury EXAM: RENAL / URINARY TRACT ULTRASOUND COMPLETE COMPARISON:  CT AP 01/18/19 FINDINGS: Right Kidney: Right kidney was not visualized. Left Kidney: Renal measurements: 11.0 x 5.8 x 5.1 cm = volume: 171 mL. Echogenicity is slightly increased. No mass or hydronephrosis visualized. Bladder: Appears normal for degree of bladder distention. Other: None. IMPRESSION: 1. Right kidney was not visualized. 2. Slightly increased echogenicity of the left kidney may be seen in medical renal disease. No hydronephrosis or nephrolithiasis. Electronically Signed   By: Marin Roberts M.D.   On: 06/18/2022 15:29   DG Chest Port 1 View  Result Date: 06/18/2022 CLINICAL DATA:  Sepsis EXAM: PORTABLE CHEST 1 VIEW COMPARISON:  CXR 03/29/19 FINDINGS: No pleural effusion. No pneumothorax. Low lung volumes. Unchanged cardiac and mediastinal contours. No focal airspace opacity. No radiographically apparent displaced rib fractures. Visualized upper abdomen is unremarkable. IMPRESSION: Low lung volumes. No acute cardiopulmonary disease. Electronically Signed   By: Marin Roberts M.D.   On: 06/18/2022 14:28    Procedures Procedures   Medications Ordered in ED Medications  0.9 %  sodium  chloride infusion ( Intravenous New Bag/Given 06/18/22 1546)  lactated ringers bolus 1,000 mL (0 mLs Intravenous Stopped 06/18/22 1352)  lactated ringers bolus 1,000 mL (0 mLs Intravenous Stopped 06/18/22 1446)  lactated ringers bolus 1,000 mL (0 mLs Intravenous Stopped 06/18/22 1546)    ED Course/ Medical Decision Making/ A&P Clinical Course as of 06/18/22 1620  Tue Jun 18, 2022  1330 Bladder scan did not show the patient was retaining urine. [RP]  [id number] Dr Tamala Julian from hospitalist to admit the patient.  Awaiting call from nephrology at this time. [RP]    Clinical Course User Index [RP] Fransico Meadow, MD                             Medical Decision Making Amount and/or Complexity of Data Reviewed Labs: ordered. Radiology: ordered. ECG/medicine tests: ordered.  Risk Decision regarding hospitalization.   Lindsey Gruba is a 62 y.o. male with comorbidities that complicate the patient evaluation including watershed stroke and urinary retention who presents emergency department with AKI and hypotension  Initial Ddx:  Hypovolemia, prerenal AKI, urinary obstruction/retention, rhabdomyolysis, shock  MDM:  Patient overall well-appearing at this time.  Feel that he may be hypovolemic leading to his low blood pressure as well as AKI.  Since he does have a history of urinary retention we will obtain bladder scan at this time but he does not report any history of difficulty urinating does not have any suprapubic fullness that I would expect.  Given his overall appearance feel that he is not in shock so we will reassess after he is given some fluids.Since he is bedbound will also send CK.  Plan:  Labs Lactate Urinalysis Bladder scan IV fluids  ED Summary/Re-evaluation:  Patient given IV fluids and pressures remained soft but did improve.  He was found to have severe AKI today.  Nephrology was consulted and we are awaiting their call back at this time.  Bladder scan showed that  he is not  retaining urine.  After 3 L still had not voided so renal ultrasound was obtained which did not show any evidence of obstruction either.  Patient was then admitted to medicine for further management.  This patient presents to the ED for concern of complaints listed in HPI, this involves an extensive number of treatment options, and is a complaint that carries with it a high risk of complications and morbidity. Disposition including potential need for admission considered.   Dispo: Admit to Floor  Additional history obtained from Nursing Home/Care Facility Records reviewed Outpatient Clinic Notes The following labs were independently interpreted: Chemistry and show AKI I independently reviewed the following imaging with scope of interpretation limited to determining acute life threatening conditions related to emergency care: Chest x-ray and agree with the radiologist interpretation with the following exceptions: None I personally reviewed and interpreted cardiac monitoring: normal sinus rhythm  I personally reviewed and interpreted the pt's EKG: see above for interpretation  I have reviewed the patients home medications and made adjustments as needed Consults: Hospitalist and Nephrology Social Determinants of health:  SNF resident  Final Clinical Impression(s) / ED Diagnoses Final diagnoses:  AKI (acute kidney injury) ([place])  Hypovolemia    Rx / DC Orders ED Discharge Orders     None         Fransico Meadow, MD 06/18/22 1620

## 2022-06-19 DIAGNOSIS — N189 Chronic kidney disease, unspecified: Secondary | ICD-10-CM | POA: Diagnosis not present

## 2022-06-19 DIAGNOSIS — N179 Acute kidney failure, unspecified: Secondary | ICD-10-CM | POA: Diagnosis not present

## 2022-06-19 LAB — SODIUM, URINE, RANDOM: Sodium, Ur: 80 mmol/L

## 2022-06-19 LAB — BASIC METABOLIC PANEL
Anion gap: 16 — ABNORMAL HIGH (ref 5–15)
BUN: 122 mg/dL — ABNORMAL HIGH (ref 8–23)
CO2: 16 mmol/L — ABNORMAL LOW (ref 22–32)
Calcium: 8.3 mg/dL — ABNORMAL LOW (ref 8.9–10.3)
Chloride: 107 mmol/L (ref 98–111)
Creatinine, Ser: 7.51 mg/dL — ABNORMAL HIGH (ref 0.61–1.24)
GFR, Estimated: 8 mL/min — ABNORMAL LOW (ref >60)
Glucose, Bld: 98 mg/dL (ref 70–99)
Potassium: 4 mmol/L (ref 3.5–5.1)
Sodium: 139 mmol/L (ref 135–145)

## 2022-06-19 LAB — CBC
HCT: 34.3 % — ABNORMAL LOW (ref 39.0–52.0)
Hemoglobin: 11.1 g/dL — ABNORMAL LOW (ref 13.0–17.0)
MCH: 27.5 pg (ref 26.0–34.0)
MCHC: 32.4 g/dL (ref 30.0–36.0)
MCV: 84.9 fL (ref 80.0–100.0)
Platelets: 162 10*3/uL (ref 150–400)
RBC: 4.04 MIL/uL — ABNORMAL LOW (ref 4.22–5.81)
RDW: 15.8 % — ABNORMAL HIGH (ref 11.5–15.5)
WBC: 6.6 10*3/uL (ref 4.0–10.5)
nRBC: 0 % (ref 0.0–0.2)

## 2022-06-19 LAB — URINE CULTURE: Culture: NO GROWTH

## 2022-06-19 MED ORDER — MIRTAZAPINE 15 MG PO TABS
15.0000 mg | ORAL_TABLET | Freq: Every day | ORAL | Status: DC
  Filled 2022-06-19: qty 1

## 2022-06-19 MED ORDER — FOLIC ACID 1 MG PO TABS
1.0000 mg | ORAL_TABLET | Freq: Every day | ORAL | Status: DC
  Filled 2022-06-19 (×2): qty 1

## 2022-06-19 MED ORDER — VITAMIN B-12 1000 MCG PO TABS
1000.0000 ug | ORAL_TABLET | Freq: Every day | ORAL | Status: DC
  Filled 2022-06-19: qty 1

## 2022-06-19 MED ORDER — ADULT MULTIVITAMIN W/MINERALS CH
1.0000 | ORAL_TABLET | Freq: Every day | ORAL | Status: DC
  Filled 2022-06-19 (×2): qty 1

## 2022-06-19 MED ORDER — FLUOXETINE HCL 20 MG PO CAPS
80.0000 mg | ORAL_CAPSULE | Freq: Every day | ORAL | Status: DC
  Filled 2022-06-19 (×2): qty 4

## 2022-06-19 MED ORDER — ASPIRIN 81 MG PO TBEC
81.0000 mg | DELAYED_RELEASE_TABLET | Freq: Every day | ORAL | Status: DC
  Filled 2022-06-19 (×2): qty 1

## 2022-06-19 MED ORDER — THIAMINE HCL 100 MG PO TABS
100.0000 mg | ORAL_TABLET | Freq: Every day | ORAL | Status: DC
  Filled 2022-06-19 (×3): qty 1

## 2022-06-19 MED ORDER — MELATONIN 3 MG PO TABS
6.0000 mg | ORAL_TABLET | Freq: Every day | ORAL | Status: DC
  Filled 2022-06-19: qty 2

## 2022-06-19 NOTE — TOC Progression Note (Signed)
Transition of Care [facility]) - Initial/Assessment Note    Patient Details  Name: Philip Vasquez MRN: [id number] Date of Birth: 30-Nov-1960  [facility] CM/SW Contact:    Milinda Antis, [place] Phone Number: 06/19/2022, 4:03 PM  Clinical Narrative:                 LCSW contacted admissions at [facility] to inquire as to whether the patient is long term or short term at the facility and is awaiting a response.  TOC following.     Patient Goals and CMS Choice            Expected Discharge Plan and Services                                              Prior Living Arrangements/Services                       Activities of Daily Living   ADL Screening (condition at time of admission) Patient's cognitive ability adequate to safely complete daily activities?: No Is the patient deaf or have difficulty hearing?: No Does the patient have difficulty seeing, even when wearing glasses/contacts?: No Does the patient have difficulty concentrating, remembering, or making decisions?: No Patient able to express need for assistance with ADLs?: Yes Does the patient have difficulty dressing or bathing?: Yes Independently performs ADLs?: No Communication: Independent Dressing (OT): Dependent Is this a change from baseline?: Pre-admission baseline Grooming: Dependent Is this a change from baseline?: Pre-admission baseline Feeding: Needs assistance Is this a change from baseline?: Pre-admission baseline Bathing: Dependent Is this a change from baseline?: Pre-admission baseline Toileting: Dependent Is this a change from baseline?: Pre-admission baseline In/Out Bed: Dependent Is this a change from baseline?: Pre-admission baseline Walks in Home: Dependent Is this a change from baseline?: Pre-admission baseline Does the patient have difficulty walking or climbing stairs?: Yes Weakness of Legs: Both Weakness of Arms/Hands: Both  Permission Sought/Granted                   Emotional Assessment              Admission diagnosis:  Hypovolemia [E86.1] ARF (acute renal failure) ([place]) [N17.9] AKI (acute kidney injury) ([place]) [N17.9] Patient Active Problem List   Diagnosis Date Noted   Acute kidney injury superimposed on chronic kidney disease ([place]) 06/18/2022   Transient hypotension [id number]   Metabolic acidosis with increased anion gap and accumulation of organic acids 06/18/2022   Normocytic anemia 06/18/2022   History of CVA with residual deficit 06/18/2022   DNR (do not resuscitate)    Palliative care by specialist    Goals of care, counseling/discussion    Depression, recurrent ([place])    Concerned about having social problem    Muscular weakness    Urinary tract infection 03/31/2019   Anemia of chronic disease 01/27/2019   Depression    Hypertension 12/21/2018   Hyperlipidemia 12/21/2018   Cerebral thrombosis with cerebral infarction 12/20/2018   Right sided weakness 12/19/2018   PCP:  Patient, No Pcp Per Pharmacy:   [facility], [place] - [address] [address] [place] [address] Phone: [phone] Fax: [phone]  [place], [place] - [address] [place] [address] Phone: [phone] Fax: [phone]  Social Determinants of Health (SDOH) Social History: SDOH Screenings   Tobacco Use: Low Risk  (06/18/2022)   SDOH Interventions:     Readmission Risk Interventions     No data to display

## 2022-06-19 NOTE — Progress Notes (Signed)
New Admission Note:    Arrival Method: ED stretcher Mental Orientation: AAOx3 Telemetry: 5M14 Assessment: Completed Skin: See flowsheet IV: LAC Pain: 0/10 Tubes: n/a Safety Measures: Safety Fall Prevention Plan has been given, discussed and signed Admission: Completed [facility] Orientation: Patient has been orientated to the room, unit and staff.  Family: none at bedside   Orders have been reviewed and implemented. Will continue to monitor the patient. Call light has been placed within reach and bed alarm has been activated.

## 2022-06-19 NOTE — Progress Notes (Signed)
Philip Vasquez  [id number] DOB: 1960/09/28 DOA: 06/18/2022 PCP: Patient, No Pcp Per    Brief Narrative:  62 year old SNF resident with a history of HTN, CVA with residual right-sided weakness/wheelchair-bound, GIB, depression, CKD stage IIIa, and prior polysubstance abuse who was sent to the ER from his SNF due to low blood pressure and acute renal failure with creatinine 9.72 and BUN of 127.  EMS found his blood pressure to be in the 70s.  Renal ultrasound noted no hydronephrosis of the left kidney and the right kidney was not able to be visualized.  Consultants:  None  Goals of Care:  Code Status: Full Code   DVT prophylaxis: SCDs  Interim Hx: Afebrile.  Vital signs stable.  Renal function rapidly improving with simple volume resuscitation.  Denies complaints.  Reports good appetite.  Is alert and conversant.  Assessment & Plan:  Acute kidney failure on CKD stage IIIA Appears to be simple prerenal state/dehydration -baseline creatinine approximately 1.5 -no evidence of obstruction on renal ultrasound but the right kidney was not able to be imaged -creatinine rapidly improving with volume -continue to monitor  Recent Labs  Lab 06/18/22 1255 06/19/22 0307  CREATININE 10.88* 7.51*    Acute hypotension Likely due to simple dehydration in the setting of blood pressure medication utilization -continue to hydrate and hold BP meds  Metabolic acidosis with elevated anion gap Due to acute kidney failure -improving  Possible UTI While his UA is abnormal this is not surprising in the setting of severe acute kidney failure -no other evidence to suggest acute infection -hold further antibiotics and follow  Chronic normocytic anemia Hemoglobin stable at this time  History of CVA 2020 with residual right-sided weakness Felt to have been related to cocaine abuse - is wheelchair-bound at baseline - SNF resident    Family Communication: No family present at time of visit Disposition:  Anticipate return to SNF when renal function improves further, likely in 3-4 days   Objective: Blood pressure 109/73, pulse 92, temperature 98.2 F (36.8 C), temperature source Oral, resp. rate 18, height '5\' 9"'$  (1.753 m), weight 81.6 kg, SpO2 100 %.  Intake/Output Summary (Last 24 hours) at 06/19/2022 1602 Last data filed at 06/19/2022 1516 Gross per 24 hour  Intake 392.86 ml  Output 1700 ml  Net -1307.14 ml   Filed Weights   06/18/22 1955  Weight: 81.6 kg    Examination: General: No acute respiratory distress Lungs: Clear to auscultation bilaterally without wheezes or crackles Cardiovascular: Regular rate and rhythm without murmur gallop or rub normal S1 and S2 Abdomen: Nontender, nondistended, soft, bowel sounds positive, no rebound, no ascites, no appreciable mass Extremities: No significant cyanosis, clubbing, or edema bilateral lower extremities  CBC: Recent Labs  Lab 06/18/22 1255 06/19/22 0307  WBC 6.6 6.6  NEUTROABS 5.0  --   HGB 11.4* 11.1*  HCT 36.1* 34.3*  MCV 85.7 84.9  PLT 233 0000000   Basic Metabolic Panel: Recent Labs  Lab 06/18/22 1255 06/19/22 0307  NA 134* 139  K 4.1 4.0  CL 97* 107  CO2 19* 16*  GLUCOSE 100* 98  BUN 150* 122*  CREATININE 10.88* 7.51*  CALCIUM 8.9 8.3*   GFR: Estimated Creatinine Clearance: 10.3 mL/min (A) (by C-G formula based on SCr of 7.51 mg/dL (H)).   Scheduled Meds:  aspirin EC  81 mg Oral Daily   FLUoxetine  80 mg Oral Daily   folic acid  1 mg Oral Daily   heparin  5,000 Units  Subcutaneous Q8H   mirtazapine  15 mg Oral QHS   multivitamin with minerals  1 tablet Oral Daily   sodium chloride flush  3 mL Intravenous Q12H   thiamine  100 mg Oral Daily   Continuous Infusions:  sodium chloride 100 mL/hr at 06/19/22 1100     LOS: 1 day   Cherene Altes, MD [facility] Office  [phone] Pager - Text Page per Shea Evans  If 7PM-7AM, please contact night-coverage per Amion 06/19/2022, 4:02 PM

## 2022-06-20 DIAGNOSIS — N179 Acute kidney failure, unspecified: Secondary | ICD-10-CM | POA: Diagnosis not present

## 2022-06-20 DIAGNOSIS — N189 Chronic kidney disease, unspecified: Secondary | ICD-10-CM | POA: Diagnosis not present

## 2022-06-20 LAB — RENAL FUNCTION PANEL
Albumin: 2.6 g/dL — ABNORMAL LOW (ref 3.5–5.0)
Anion gap: 11 (ref 5–15)
BUN: 78 mg/dL — ABNORMAL HIGH (ref 8–23)
CO2: 21 mmol/L — ABNORMAL LOW (ref 22–32)
Calcium: 8.5 mg/dL — ABNORMAL LOW (ref 8.9–10.3)
Chloride: 114 mmol/L — ABNORMAL HIGH (ref 98–111)
Creatinine, Ser: 4.26 mg/dL — ABNORMAL HIGH (ref 0.61–1.24)
GFR, Estimated: 15 mL/min — ABNORMAL LOW (ref >60)
Glucose, Bld: 91 mg/dL (ref 70–99)
Phosphorus: 3.7 mg/dL (ref 2.5–4.6)
Potassium: 4.1 mmol/L (ref 3.5–5.1)
Sodium: 146 mmol/L — ABNORMAL HIGH (ref 135–145)

## 2022-06-20 LAB — GLUCOSE, CAPILLARY: Glucose-Capillary: 73 mg/dL (ref 70–99)

## 2022-06-20 LAB — CBC
HCT: 34 % — ABNORMAL LOW (ref 39.0–52.0)
Hemoglobin: 11.1 g/dL — ABNORMAL LOW (ref 13.0–17.0)
MCH: 27.7 pg (ref 26.0–34.0)
MCHC: 32.6 g/dL (ref 30.0–36.0)
MCV: 84.8 fL (ref 80.0–100.0)
Platelets: 211 10*3/uL (ref 150–400)
RBC: 4.01 MIL/uL — ABNORMAL LOW (ref 4.22–5.81)
RDW: 16 % — ABNORMAL HIGH (ref 11.5–15.5)
WBC: 5.6 10*3/uL (ref 4.0–10.5)
nRBC: 0 % (ref 0.0–0.2)

## 2022-06-20 LAB — HIV ANTIBODY (ROUTINE TESTING W REFLEX): HIV Screen 4th Generation wRfx: NONREACTIVE

## 2022-06-20 MED ORDER — SODIUM CHLORIDE 0.45 % IV BOLUS
1000.0000 mL | Freq: Once | INTRAVENOUS | Status: AC
  Administered 2022-06-20: 1000 mL via INTRAVENOUS

## 2022-06-20 MED ORDER — ACETAMINOPHEN 325 MG PO TABS: 650.0000 mg | ORAL_TABLET | Freq: Four times a day (QID) | ORAL | Status: AC | PRN

## 2022-06-20 NOTE — TOC Transition Note (Signed)
Transition of Care [facility]) - CM/SW Discharge Note   Patient Details  Name: Wadsworth Urbanek MRN: [id number] Date of Birth: June 09, 1960  Transition of Care [facility]) CM/SW Contact:  Milinda Antis, LCSWA Phone Number: 06/20/2022, 11:34 AM   Clinical Narrative:    Patient will DC to: [facility] Anticipated DC date:  06/20/2022 Family notified: patient alert and oriented Transport by: Corey Harold   Per MD patient ready for DC to SNF. RN to call report prior to discharge ([phone]). RN, patient, and facility notified of DC. Discharge Summary sent to facility. DC packet on chart. Ambulance transport will be requested for patient.   CSW will sign off for now as social work intervention is no longer needed. Please consult [place] again if new needs arise.    Final next level of care: Skilled Nursing Facility Barriers to Discharge: No Barriers Identified   Patient Goals and CMS Choice CMS Medicare.gov Compare Post Acute Care list provided to:: Patient Choice offered to / list presented to : Patient  Discharge Placement                Patient chooses bed at:  [facility]) Patient to be transferred to facility by: [place] Name of family member notified: alert and oriented Patient and family notified of of transfer: 06/20/22  Discharge Plan and Services Additional resources added to the After Visit Summary for                                       Social Determinants of Health (SDOH) Interventions SDOH Screenings   Tobacco Use: Low Risk  (06/18/2022)     Readmission Risk Interventions     No data to display

## 2022-06-20 NOTE — Progress Notes (Signed)
Attempted numerous times to call report over ~ 30 minute time period unsuccessfully. Would be transferred by receptionist and would end up on voice mail. Did leave message with my call back number. No HIPAA information was left on voicemail only my name, impending transfer and phone number. Will attempt to call again before transfer.

## 2022-06-20 NOTE — TOC Progression Note (Signed)
Transition of Care [facility]) - Initial/Assessment Note    Patient Details  Name: Philip Vasquez MRN: [id number] Date of Birth: 1961/03/19  Transition of Care [facility]) CM/SW Contact:    Milinda Antis, LCSWA Phone Number: 06/20/2022, 10:07 AM  Clinical Narrative:                 LCSW contacted admissions at [facility].  The patient is long term at the facility and can return when medically ready.  TOC following.   Patient Goals and CMS Choice            Expected Discharge Plan and Services                                              Prior Living Arrangements/Services                       Activities of Daily Living Home Assistive Devices/Equipment: Wheelchair ADL Screening (condition at time of admission) Patient's cognitive ability adequate to safely complete daily activities?: No Is the patient deaf or have difficulty hearing?: No Does the patient have difficulty seeing, even when wearing glasses/contacts?: No Does the patient have difficulty concentrating, remembering, or making decisions?: No Patient able to express need for assistance with ADLs?: Yes Does the patient have difficulty dressing or bathing?: Yes Independently performs ADLs?: No Communication: Independent Dressing (OT): Dependent Is this a change from baseline?: Pre-admission baseline Grooming: Dependent Is this a change from baseline?: Pre-admission baseline Feeding: Needs assistance Is this a change from baseline?: Pre-admission baseline Bathing: Dependent Is this a change from baseline?: Pre-admission baseline Toileting: Dependent Is this a change from baseline?: Pre-admission baseline In/Out Bed: Dependent Is this a change from baseline?: Pre-admission baseline Walks in Home: Dependent Is this a change from baseline?: Pre-admission baseline Does the patient have difficulty walking or climbing stairs?: Yes Weakness of Legs: Both Weakness of Arms/Hands: Both  Permission  Sought/Granted                  Emotional Assessment              Admission diagnosis:  Hypovolemia [E86.1] ARF (acute renal failure) (HCC) [N17.9] AKI (acute kidney injury) ([place]) [N17.9] Patient Active Problem List   Diagnosis Date Noted   Acute kidney injury superimposed on chronic kidney disease ([place]) 06/18/2022   Transient hypotension [id number]   Metabolic acidosis with increased anion gap and accumulation of organic acids 06/18/2022   Normocytic anemia 06/18/2022   History of CVA with residual deficit 06/18/2022   DNR (do not resuscitate)    Palliative care by specialist    Goals of care, counseling/discussion    Depression, recurrent ([place])    Concerned about having social problem    Muscular weakness    Urinary tract infection 03/31/2019   Anemia of chronic disease 01/27/2019   Depression    Hypertension 12/21/2018   Hyperlipidemia 12/21/2018   Cerebral thrombosis with cerebral infarction 12/20/2018   Right sided weakness 12/19/2018   PCP:  Patient, No Pcp Per Pharmacy:   [facility] [place] [facility] [address] [place] [address] Phone: [phone] Fax: [phone]  [place], [place] - [address] [place] [address] Phone: [phone] Fax: [phone]  [facility]  [address] [place] [address] Phone: [phone] Fax: [phone]     Social Determinants of Health (SDOH) Social History: SDOH Screenings   Tobacco Use: Low Risk  (06/18/2022)   SDOH Interventions:     Readmission Risk Interventions     No data to display

## 2022-06-20 NOTE — Discharge Summary (Signed)
DISCHARGE SUMMARY  Philip Vasquez  MR#: [id number]  DOB:March 07, 1961  Date of Admission: 06/18/2022 Date of Discharge: 06/20/2022  Attending Physician:Careem Yasui Hennie Duos, MD  Patient's BP:[id number], No Pcp Per  Consults: none  Disposition: D/C to SNF (where he resided prior to admit)  Follow-up Appts:  Follow-up Information     Attending of Record at your SNF Follow up.   Why: Your ongoing care will be provided by the attending of record at your SNF.                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 EMS found his blood pressure to be in the 70s. Renal ultrasound noted no hydronephrosis of the left kidney and the right kidney was not able to be visualized.   Hospital Course:  Acute kidney failure on CKD stage IIIA Appears to have been simple prerenal state/dehydration - baseline creatinine approximately 1.5 - no evidence of obstruction on renal US but the right kidney was not able to be imaged - nonetheless, crt has rapidly improved with IVF resuscitation  Refusal of meds/diet During the patient's hospital stay he often refused to eat,  and frequently would refuse medication as well -it was felt this likely represented a poor reaction to the hospital environment v/s effects of uremia, and it is hoped that returning to his usual environment as well as declining BUN will correct this behavior - his intake of food and liquid will need to be followed closely at his skilled nursing facility - if he continues to refuse to eat consideration will need to be given to giving him IV fluid in the skilled nursing facility and also consulting Psychiatry  Acute hypotension due to simple dehydration in the setting of blood pressure medication utilization and poor intake -resolved with hydration -discontinue blood pressure meds until clear that patient's intake will be consistent and blood pressure rebounds consistently   Metabolic acidosis with elevated anion gap Due to acute kidney failure - resolved   Possible UTI - ruled out While his UA is abnormal this is not surprising in the setting of severe acute kidney failure - no other evidence to suggest acute infection -was dosed with ceftriaxone x 1 with no further antibiotic required -urine culture with no growth   Chronic normocytic anemia Hemoglobin stable during this admission   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   aspirin EC 81 MG tablet Take 1 tablet (81 mg  total) by mouth daily.   Cholecalciferol 25 MCG (1000 UT) Tbdp Take 1,000 Units by mouth every morning.   cyanocobalamin 1000 MCG tablet Commonly known as: VITAMIN B12 Take 1,000 mcg by mouth daily.   FLUoxetine 40 MG capsule Commonly known as: PROZAC Take 2 capsules (80 mg total) by mouth daily.   folic acid 1 MG tablet Commonly known as: FOLVITE Take 1 tablet (1 mg total) by mouth daily.   hydrocerin Crea Apply 1 application topically 2 (two) times daily.   melatonin 3 MG Tabs tablet Take 6 mg by mouth at bedtime.   mirtazapine 15 MG tablet Commonly known as: REMERON Take 1 tablet (15 mg total) by mouth at bedtime.   multivitamin with minerals Tabs tablet Take 1 tablet by mouth daily.   omeprazole 20 MG capsule Commonly known as: PRILOSEC Take 20 mg by mouth every morning.   ondansetron 4 MG tablet Commonly known as: ZOFRAN Take 4 mg by mouth every 8 (eight) hours as needed for nausea or vomiting.   polyethylene glycol 17 g packet Commonly known as: MIRALAX / GLYCOLAX Take 17 g by mouth daily.   rosuvastatin 40 MG tablet Commonly known as: CRESTOR Take 1 tablet (40 mg total) by mouth daily at 6 PM.   senna-docusate 8.6-50 MG tablet Commonly known as: Senokot-S Take 2 tablets by mouth at bedtime.   thiamine 100 MG tablet Commonly known as: VITAMIN B1 Take 1 tablet (100 mg total) by mouth daily.   urea 20 % cream Commonly known as: CARMOL Apply 1 application  topically daily. Apply to feet topically every day shift for dryness.        Day of Discharge BP 101/69 (BP Location: Right Arm)   Pulse 92   Temp 97.6 F (36.4 C) (Oral)   Resp 18   Ht '5\' 9"'$  (1.753 m)   Wt 81.1 kg   SpO2 100%   BMI 26.40 kg/m   Physical Exam: General: No acute respiratory distress Lungs: Clear to auscultation bilaterally without wheezes or crackles Cardiovascular: Regular rate and rhythm without murmur  Abdomen: Nontender, nondistended, soft, bowel sounds positive,  no mass Extremities: No significant cyanosis, clubbing, or edema bilateral lower extremities  Basic Metabolic Panel: Recent Labs  Lab 06/18/22 1255 06/19/22 0307 06/20/22 0813  NA 134* 139 146*  K 4.1 4.0 4.1  CL 97* 107 114*  CO2 19* 16* 21*  GLUCOSE 100* 98 91  BUN 150* 122* 78*  CREATININE 10.88* 7.51* 4.26*  CALCIUM 8.9 8.3* 8.5*  PHOS  --   --  3.7   CBC: Recent Labs  Lab 06/18/22 1255 06/19/22 0307 06/20/22 0813  WBC 6.6 6.6 5.6  NEUTROABS 5.0  --   --   HGB 11.4* 11.1* 11.1*  HCT 36.1* 34.3* 34.0*  MCV 85.7 84.9 84.8  PLT 233 162 211   Recent Results (from the past 240 hour(s))  Urine Culture (for pregnant, neutropenic or urologic patients or patients with an indwelling urinary catheter)     Status: None   Collection Time: 06/18/22  9:42 PM  Specimen: Urine, Clean Catch  Result Value Ref Range Status   Specimen Description URINE, CLEAN CATCH  Final   Special Requests NONE  Final   Culture   Final    NO GROWTH Performed at [facility], [address] [place], [place] [address]    Report Status 06/19/2022 FINAL  Final      Time spent in discharge (includes decision making & examination of pt): 35 minutes  06/20/2022, 10:45 AM   Cherene Altes, MD [facility] Office  [phone]

## 2022-06-20 NOTE — Progress Notes (Incomplete)
Philip Vasquez  [id number] DOB: [birth year] DOA: 06/18/2022 PCP: Patient, No Pcp Per    Brief Narrative:  62 year old SNF resident with a history of HTN, CVA with residual right-sided weakness/wheelchair-bound, GIB, depression, CKD stage IIIa, and prior polysubstance abuse who was sent to the ER from his SNF due to low blood pressure and acute renal failure with creatinine 9.72 and BUN of 127.  EMS found his blood pressure to be in the 70s.  Renal ultrasound noted no hydronephrosis of the left kidney and the right kidney was not able to be visualized.  Consultants:  None  Goals of Care:  Code Status: Full Code   DVT prophylaxis: SCDs  Interim Hx: The patient has been refusing to eat or take his medications.  When asked why he simply says "I do not want to."  Assessment & Plan:  Acute kidney failure on CKD stage IIIA Appears to be simple prerenal state/dehydration - baseline creatinine approximately 1.5 - no evidence of obstruction on renal US but the right kidney was not able to be imaged -creatinine rapidly improving with volume -continue to monitor  Recent Labs  Lab 06/18/22 1255 06/19/22 0307  CREATININE 10.88* 7.51*     Acute hypotension Likely due to simple dehydration in the setting of blood pressure medication utilization and poor intake - continue to hydrate - hold BP meds  Metabolic acidosis with elevated anion gap Due to acute kidney failure - improving  Possible UTI While his UA is abnormal this is not surprising in the setting of severe acute kidney failure - no other evidence to suggest acute infection -was dosed with ceftriaxone x 1 with no further antibiotic required -urine culture with no growth  Chronic normocytic anemia Hemoglobin stable during this admission  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 rate 20, height '5\' 9"'$  (1.753 m), weight 81.1 kg, SpO2 100 %.  Intake/Output Summary (Last 24 hours) at 06/20/2022 0933 Last data filed at 06/20/2022 0449 Gross per 24 hour  Intake 1455.67 ml  Output 1900 ml  Net -444.33 ml    Filed Weights   06/18/22 1955 06/20/22 0448  Weight: 81.6 kg 81.1 kg    Examination: General: No acute respiratory distress Lungs: Clear to auscultation bilaterally without wheezes or crackles Cardiovascular: Regular rate and rhythm without murmur gallop or rub normal S1 and S2 Abdomen: Nontender, nondistended, soft, bowel sounds positive, no rebound, no ascites, no appreciable mass Extremities: No significant cyanosis, clubbing, or edema bilateral lower extremities  CBC: Recent Labs  Lab 06/18/22 1255 06/19/22 0307 06/20/22 0813  WBC 6.6 6.6 5.6  NEUTROABS 5.0  --   --   HGB 11.4* 11.1* 11.1*  HCT 36.1* 34.3* 34.0*  MCV 85.7 84.9 84.8  PLT 233 162 [id number]    Basic Metabolic Panel: Recent Labs  Lab 06/18/22 1255 06/19/22 0307  NA 134* 139  K 4.1 4.0  CL 97* 107  CO2 19* 16*  GLUCOSE 100* 98  BUN 150* 122*  CREATININE 10.88* 7.51*  CALCIUM 8.9 8.3*    GFR: Estimated Creatinine Clearance: 10.3 mL/min (A) (by C-G formula based on SCr of 7.51 mg/dL (H)).   Scheduled Meds:  aspirin EC  81  mg Oral Daily   cyanocobalamin  1,000 mcg Oral Daily   FLUoxetine  80 mg Oral Daily   folic acid  1 mg Oral Daily   heparin  5,000 Units Subcutaneous Q8H   melatonin  6 mg Oral QHS   mirtazapine  15 mg Oral QHS   multivitamin with minerals  1 tablet Oral Daily   sodium chloride flush  3 mL Intravenous Q12H   thiamine  100 mg Oral Daily   Continuous Infusions:  sodium chloride 80 mL/hr at 06/20/22 0648     LOS: 2 days   Cherene Altes, MD [facility] Office   [phone] Pager - Text Page per Shea Evans  If 7PM-7AM, please contact night-coverage per Amion 06/20/2022, 9:33 AM

## 2022-06-20 NOTE — Progress Notes (Addendum)
Spoke with "Philip Vasquez" at [facility]. She was familiar with him and reason he was transferred to [facility]. Gave update especially about kidney function and fluid given. Also informed her that he has been refusing meds and food for which reportedly he had been refusing meds there also. Awaiting [facility] ambulance for transport.

## 2022-06-20 NOTE — Progress Notes (Signed)
PT refusing all meds and has been refusing to eat even when a new lunch tray yesterday was ordered per his request. This morning he did not want breakfast either. PT is awake and alert and just states ":I don't want it". I tried explaining the importance and what meds I had for him but he still refused meds.

## 2022-06-21 LAB — UREA NITROGEN, URINE: Urea Nitrogen, Ur: 500 mg/dL

## 2022-06-23 ENCOUNTER — Inpatient Hospital Stay (HOSPITAL_COMMUNITY)
Admission: EM | Admit: 2022-06-23 | Discharge: 2022-06-24 | DRG: 378 | Disposition: A | Discharge status: Skilled Nursing Facility | Payer: Medicaid Other | Source: Skilled Nursing Facility | Attending: Internal Medicine | Admitting: Internal Medicine

## 2022-06-23 ENCOUNTER — Other Ambulatory Visit: Payer: Self-pay

## 2022-06-23 ENCOUNTER — Encounter (HOSPITAL_COMMUNITY): Payer: Self-pay | Admitting: Internal Medicine

## 2022-06-23 DIAGNOSIS — K921 Melena: Secondary | ICD-10-CM

## 2022-06-23 DIAGNOSIS — D649 Anemia, unspecified: Secondary | ICD-10-CM | POA: Diagnosis not present

## 2022-06-23 DIAGNOSIS — R5383 Other fatigue: Secondary | ICD-10-CM | POA: Diagnosis present

## 2022-06-23 DIAGNOSIS — N179 Acute kidney failure, unspecified: Secondary | ICD-10-CM | POA: Diagnosis present

## 2022-06-23 DIAGNOSIS — K254 Chronic or unspecified gastric ulcer with hemorrhage: Secondary | ICD-10-CM | POA: Diagnosis present

## 2022-06-23 DIAGNOSIS — K920 Hematemesis: Secondary | ICD-10-CM

## 2022-06-23 DIAGNOSIS — I6930 Unspecified sequelae of cerebral infarction: Secondary | ICD-10-CM | POA: Diagnosis not present

## 2022-06-23 DIAGNOSIS — I10 Essential (primary) hypertension: Secondary | ICD-10-CM | POA: Diagnosis present

## 2022-06-23 DIAGNOSIS — F32A Depression, unspecified: Secondary | ICD-10-CM | POA: Diagnosis present

## 2022-06-23 DIAGNOSIS — D696 Thrombocytopenia, unspecified: Secondary | ICD-10-CM | POA: Insufficient documentation

## 2022-06-23 DIAGNOSIS — N1832 Chronic kidney disease, stage 3b: Secondary | ICD-10-CM | POA: Diagnosis not present

## 2022-06-23 DIAGNOSIS — R4589 Other symptoms and signs involving emotional state: Secondary | ICD-10-CM

## 2022-06-23 DIAGNOSIS — K2971 Gastritis, unspecified, with bleeding: Secondary | ICD-10-CM | POA: Diagnosis present

## 2022-06-23 DIAGNOSIS — K264 Chronic or unspecified duodenal ulcer with hemorrhage: Secondary | ICD-10-CM | POA: Diagnosis present

## 2022-06-23 DIAGNOSIS — Z7982 Long term (current) use of aspirin: Secondary | ICD-10-CM | POA: Diagnosis not present

## 2022-06-23 DIAGNOSIS — K922 Gastrointestinal hemorrhage, unspecified: Secondary | ICD-10-CM | POA: Diagnosis not present

## 2022-06-23 DIAGNOSIS — I69351 Hemiplegia and hemiparesis following cerebral infarction affecting right dominant side: Secondary | ICD-10-CM

## 2022-06-23 DIAGNOSIS — Z8673 Personal history of transient ischemic attack (TIA), and cerebral infarction without residual deficits: Secondary | ICD-10-CM | POA: Diagnosis not present

## 2022-06-23 DIAGNOSIS — B9681 Helicobacter pylori [H. pylori] as the cause of diseases classified elsewhere: Secondary | ICD-10-CM | POA: Diagnosis present

## 2022-06-23 DIAGNOSIS — I129 Hypertensive chronic kidney disease with stage 1 through stage 4 chronic kidney disease, or unspecified chronic kidney disease: Secondary | ICD-10-CM | POA: Diagnosis present

## 2022-06-23 DIAGNOSIS — Z79899 Other long term (current) drug therapy: Secondary | ICD-10-CM

## 2022-06-23 DIAGNOSIS — D62 Acute posthemorrhagic anemia: Secondary | ICD-10-CM | POA: Diagnosis present

## 2022-06-23 DIAGNOSIS — E785 Hyperlipidemia, unspecified: Secondary | ICD-10-CM | POA: Diagnosis present

## 2022-06-23 DIAGNOSIS — N184 Chronic kidney disease, stage 4 (severe): Secondary | ICD-10-CM

## 2022-06-23 HISTORY — DX: Chronic kidney disease, stage 3a: N18.31

## 2022-06-23 HISTORY — DX: Anemia, unspecified: D64.9

## 2022-06-23 LAB — COMPREHENSIVE METABOLIC PANEL
ALT: 27 U/L (ref 0–44)
AST: 28 U/L (ref 15–41)
Albumin: 2.9 g/dL — ABNORMAL LOW (ref 3.5–5.0)
Alkaline Phosphatase: 66 U/L (ref 38–126)
Anion gap: 3 — ABNORMAL LOW (ref 5–15)
BUN: 46 mg/dL — ABNORMAL HIGH (ref 8–23)
CO2: 25 mmol/L (ref 22–32)
Calcium: 8.3 mg/dL — ABNORMAL LOW (ref 8.9–10.3)
Chloride: 118 mmol/L — ABNORMAL HIGH (ref 98–111)
Creatinine, Ser: 2.17 mg/dL — ABNORMAL HIGH (ref 0.61–1.24)
GFR, Estimated: 34 mL/min — ABNORMAL LOW (ref >60)
Glucose, Bld: 111 mg/dL — ABNORMAL HIGH (ref 70–99)
Potassium: 4.1 mmol/L (ref 3.5–5.1)
Sodium: 146 mmol/L — ABNORMAL HIGH (ref 135–145)
Total Bilirubin: 0.5 mg/dL (ref 0.3–1.2)
Total Protein: 6.2 g/dL — ABNORMAL LOW (ref 6.5–8.1)

## 2022-06-23 LAB — PROTIME-INR
INR: 1.1 (ref 0.8–1.2)
Prothrombin Time: 13.8 seconds (ref 11.4–15.2)

## 2022-06-23 LAB — CBC
HCT: 23.8 % — ABNORMAL LOW (ref 39.0–52.0)
Hemoglobin: 7.2 g/dL — ABNORMAL LOW (ref 13.0–17.0)
MCH: 27.6 pg (ref 26.0–34.0)
MCHC: 30.3 g/dL (ref 30.0–36.0)
MCV: 91.2 fL (ref 80.0–100.0)
Platelets: 131 10*3/uL — ABNORMAL LOW (ref 150–400)
RBC: 2.61 MIL/uL — ABNORMAL LOW (ref 4.22–5.81)
RDW: 16.7 % — ABNORMAL HIGH (ref 11.5–15.5)
WBC: 5.6 10*3/uL (ref 4.0–10.5)
nRBC: 0 % (ref 0.0–0.2)

## 2022-06-23 LAB — POC OCCULT BLOOD, ED: Fecal Occult Bld: POSITIVE — AB

## 2022-06-23 LAB — PREPARE RBC (CROSSMATCH)

## 2022-06-23 MED ORDER — MIRTAZAPINE 15 MG PO TABS
15.0000 mg | ORAL_TABLET | Freq: Every day | ORAL | Status: DC
  Administered 2022-06-23: 15 mg via ORAL
  Filled 2022-06-23: qty 1

## 2022-06-23 MED ORDER — PANTOPRAZOLE 80MG IVPB - SIMPLE MED
80.0000 mg | Freq: Once | INTRAVENOUS | Status: AC
  Administered 2022-06-23: 80 mg via INTRAVENOUS
  Filled 2022-06-23: qty 80

## 2022-06-23 MED ORDER — ACETAMINOPHEN 325 MG PO TABS: 650.0000 mg | ORAL_TABLET | Freq: Four times a day (QID) | ORAL | Status: DC | PRN

## 2022-06-23 MED ORDER — SODIUM CHLORIDE 0.9% IV SOLUTION: Freq: Once | INTRAVENOUS | Status: AC

## 2022-06-23 MED ORDER — PANTOPRAZOLE INFUSION (NEW) - SIMPLE MED
8.0000 mg/h | INTRAVENOUS | Status: DC
  Administered 2022-06-23 – 2022-06-24 (×3): 8 mg/h via INTRAVENOUS
  Filled 2022-06-23 (×2): qty 80
  Filled 2022-06-23 (×2): qty 100

## 2022-06-23 MED ORDER — PANTOPRAZOLE SODIUM 40 MG IV SOLR: 40.0000 mg | Freq: Two times a day (BID) | INTRAVENOUS | Status: DC

## 2022-06-23 MED ORDER — FLUOXETINE HCL 20 MG PO CAPS
80.0000 mg | ORAL_CAPSULE | Freq: Every day | ORAL | Status: DC
  Filled 2022-06-23: qty 4

## 2022-06-23 MED ORDER — LACTATED RINGERS IV SOLN: INTRAVENOUS | Status: DC

## 2022-06-23 MED ORDER — ACETAMINOPHEN 650 MG RE SUPP: 650.0000 mg | Freq: Four times a day (QID) | RECTAL | Status: DC | PRN

## 2022-06-23 MED ORDER — SODIUM CHLORIDE 0.9% FLUSH
3.0000 mL | Freq: Two times a day (BID) | INTRAVENOUS | Status: DC
  Administered 2022-06-23: 3 mL via INTRAVENOUS

## 2022-06-23 NOTE — Hospital Course (Addendum)
Philip Vasquez is a 62 year old male with PMH CVA with residual right side weakness, CKD3a, normocytic anemia who presented after developing multiple episodes of hematemesis and dark stools today.  He was feeling in his normal state of health yesterday.  He denied any nausea, only vomiting symptoms.  He also denies any diarrhea.  He has no prior history of similar. His affect is odd in the ER and he admits to drinking "a lot" of beer but would not quantify however patient does reside at [facility] (will attempt to clarify if he does infact drink beer there). He denied any tobacco use.   Of note, he was recently hospitalized from  3/12 to 3/14 for dehydration resulting in acute on chronic renal failure (creat 10.88 on admission, discharged with creat 4.26).  Prior to discharge, he was found to be intermittently refusing to eat or drink.  He was recommended to have psychiatry evaluation if this were to recur.  He underwent further workup in the ER regarding suspected GI bleed.  BP 116/78.  Mild tachycardia, 111. Hgb 7.2 g/dL and he was discharged with hemoglobin 11.1 g/dL on 06/20/2022. Renal function showed further improvement, creatinine on workup 2.17 and was previously 4.26 on 06/20/2022. He was having no further hematemesis or dark stools during workup in the ER therefore no further imaging was pursued. GI was consulted from the ER and patient being admitted for further workup.  As noted above, his affect was very odd.  At first he did not want to answer many of my questions but then began doing so. He stated "I asked a lot of personal questions." He also did not allow physical exam initially but as I was leaving room changed his mind.  He underwent EGD on 3/18 with GI. See results below. Recommended to continue on protonix 40 mg BID for 1 month then daily thereafter.

## 2022-06-23 NOTE — Assessment & Plan Note (Signed)
-   See GI bleed above.  Baseline hemoglobin around 11 g/dL typically

## 2022-06-23 NOTE — ED Triage Notes (Signed)
Patient brought in by EMS from [facility] for GI bleed. Per EMS patient has had ABD pain since breakfast this morning, dark stools and had vomited coffee grind vomit. He is A&O, refuses to talk to staff per EMS patient stated he was done answering questions. HX of stroke with R side weakness. EMS started 20g IV in L AC and gave 375ml of NS, patient denied nausea.  CBG:127 118/82 98% RA

## 2022-06-23 NOTE — Assessment & Plan Note (Signed)
-   No home meds noted.  Hold regardless in setting of GI bleed

## 2022-06-23 NOTE — Assessment & Plan Note (Addendum)
-   Patient endorsing approximately 3 episodes of vomiting "red" blood.  No nausea prior.  Appetite has been okay.  Patient does endorse questionable excessive alcohol use. Patient denied OTC NASIDs and none listed on med rec aside from aspirin use -Baseline hemoglobin appears to be around 11 g/dL which he was discharged at on 06/20/2022.  He presents with hemoglobin 7.2 g/dL - Currently blood pressure stable - Trend hemoglobin; ER has ordered 2 units PRBC - Hgb improved to 11.5 and stable after transfusions - s/p EGD on 3/18: multiple non bleeding superficial gastric ulcers and 2 non bleeding duodenal ulcers with clean base. Recommended to continue BID protonix x 2 months then daily per GI

## 2022-06-23 NOTE — Assessment & Plan Note (Addendum)
-   Patient displaying odd affect during assessment and ER.  He was noted to also be refusing to eat previously at his SNF and also during prior hospitalization.  Psychiatry evaluation was recommended if continued -remains with odd affect; still consider psych workup at discharge if does deny treatments etc - patient compliant in hospital

## 2022-06-23 NOTE — H&P (Signed)
History and Physical    Philip Vasquez  [id number]  DOB: October 26, 1960  DOA: 06/23/2022  PCP: Patient, No Pcp Per Patient coming from: [facility] Complaint: hematemesis, dark stools  HPI:  Philip Vasquez is a 62 year old male with PMH CVA with residual right side weakness, CKD3a, normocytic anemia who presented after developing multiple episodes of hematemesis and dark stools today.  He was feeling in his normal state of health yesterday.  He denied any nausea, only vomiting symptoms.  He also denies any diarrhea.  He has no prior history of similar. His affect is odd in the ER and he admits to drinking "a lot" of beer but would not quantify however patient does reside at [facility] (will attempt to clarify if he does infact drink beer there). He denied any tobacco use.   Of note, he was recently hospitalized from  3/12 to 3/14 for dehydration resulting in acute on chronic renal failure (creat 10.88 on admission, discharged with creat 4.26).  Prior to discharge, he was found to be intermittently refusing to eat or drink.  He was recommended to have psychiatry evaluation if this were to recur.  He underwent further workup in the ER regarding suspected GI bleed.  BP 116/78.  Mild tachycardia, 111. Hgb 7.2 g/dL and he was discharged with hemoglobin 11.1 g/dL on 06/20/2022. Renal function showed further improvement, creatinine on workup 2.17 and was previously 4.26 on 06/20/2022. He was having no further hematemesis or dark stools during workup in the ER therefore no further imaging was pursued. GI was consulted from the ER and patient being admitted for further workup.  As noted above, his affect was very odd.  At first he did not want to answer many of my questions but then began doing so. He stated "I asked a lot of personal questions." He also did not allow physical exam initially but as I was leaving room changed his mind.  I have personally briefly reviewed patient's old medical  records in [facility] and discussed patient with the ER provider when appropriate/indicated.  Assessment and Plan: * GIB (gastrointestinal bleeding) - Patient endorsing approximately 3 episodes of vomiting "red" blood.  No nausea prior.  Appetite has been okay.  Patient does endorse questionable excessive alcohol use but nursing home unable to be reached (voicemail multiple times). Patient denied OTC NASIDs and none listed on med rec aside from aspirin use -Baseline hemoglobin appears to be around 11 g/dL which he was discharged at on 06/20/2022.  He presents with hemoglobin 7.2 g/dL - Currently blood pressure stable - Trend hemoglobin; ER has ordered 2 units PRBC - Follow-up GI evaluation - Continue fluids - Continue Protonix drip - CLD okay for now per GI then NPO at [facility] (as per ER note)  Acute renal failure superimposed on stage 3b chronic kidney disease ([place]) - patient has history of CKD3b. Baseline creat ~ 1.5 - 1.7, eGFR~ 43 - patient presents with increase in creat >0.3 mg/dL above baseline, creat increase >1.5x baseline presumed to have occurred within past 7 days PTA -Patient continues to recover from prior renal injury - Creatinine 2.17 on admission which is improved from 4.26 on 06/20/2022 - Continue fluids - Continue trending BMP   Disturbance in affect - Patient displaying odd affect during assessment and ER.  He was noted to also be refusing to eat previously at his SNF and also during prior hospitalization.  Psychiatry evaluation was recommended if continued -Overall, low threshold for involving psychiatry  during hospitalization if he does start to refuse workup etc.  - so far patient is amenable to workup   Normocytic anemia - See GI bleed above.  Baseline hemoglobin around 11 g/dL typically  History of CVA with residual deficit - Residual right-sided weakness -Hold aspirin  Thrombocytopenia (HCC) - Prior platelet count normal.  Suspect acutely low in setting of  acute illness - Trend CBC  Depression - Continue Prozac and Remeron  Hyperlipidemia - Hold statin for now  Hypertension - No home meds noted.  Hold regardless in setting of GI bleed    Code Status:     Code Status: Full Code  DVT Prophylaxis: SCD     Anticipated disposition is to: SNF  History: Past Medical History:  Diagnosis Date   Chronic kidney disease, stage 3a ([place])    Cocaine use disorder ([place]) 12/21/2018   History of CVA with residual deficit    residual right side weakness   Normocytic anemia     No past surgical history on file.   reports that he has never smoked. He has never used smokeless tobacco. He reports current alcohol use. He reports that he does not use drugs.  No Known Allergies  No family history on file.  Home Medications: Prior to Admission medications   Medication Sig Start Date End Date Taking? Authorizing Provider  acetaminophen (TYLENOL) 325 MG tablet Take 2 tablets (650 mg total) by mouth every 6 (six) hours as needed for mild pain (or Fever >/= 101). 06/20/22   Cherene Altes, MD  aspirin EC 81 MG EC tablet Take 1 tablet (81 mg total) by mouth daily. 06/15/19   Mitzi Hansen, MD  Cholecalciferol 25 MCG (1000 UT) TBDP Take 1,000 Units by mouth every morning.    [provider]  cyanocobalamin (VITAMIN B12) 1000 MCG tablet Take 1,000 mcg by mouth daily.    [provider]  FLUoxetine (PROZAC) 40 MG capsule Take 2 capsules (80 mg total) by mouth daily. 06/15/19   Mitzi Hansen, MD  folic acid (FOLVITE) 1 MG tablet Take 1 tablet (1 mg total) by mouth daily. 06/15/19   Mitzi Hansen, MD  hydrocerin (EUCERIN) CREA Apply 1 application topically 2 (two) times daily. 06/14/19   Mitzi Hansen, MD  melatonin 3 MG TABS tablet Take 6 mg by mouth at bedtime.    [provider]  mirtazapine (REMERON) 15 MG tablet Take 1 tablet (15 mg total) by mouth at bedtime. Patient not taking: Reported on 06/19/2022 06/14/19    Mitzi Hansen, MD  Multiple Vitamin (MULTIVITAMIN WITH MINERALS) TABS tablet Take 1 tablet by mouth daily. 06/15/19   Mitzi Hansen, MD  omeprazole (PRILOSEC) 20 MG capsule Take 20 mg by mouth every morning.    [provider]  ondansetron (ZOFRAN) 4 MG tablet Take 4 mg by mouth every 8 (eight) hours as needed for nausea or vomiting.    [provider]  polyethylene glycol (MIRALAX / GLYCOLAX) 17 g packet Take 17 g by mouth daily. 06/15/19   Mitzi Hansen, MD  rosuvastatin (CRESTOR) 40 MG tablet Take 1 tablet (40 mg total) by mouth daily at 6 PM. 06/14/19   Christian, Rylee, MD  senna-docusate (SENOKOT-S) 8.6-50 MG tablet Take 2 tablets by mouth at bedtime. 06/14/19   Mitzi Hansen, MD  thiamine 100 MG tablet Take 1 tablet (100 mg total) by mouth daily. 06/15/19   Christian, Rylee, MD  urea (CARMOL) 20 % cream Apply 1 application  topically daily. Apply to  feet topically every day shift for dryness.    [provider]    Review of Systems:  Review of Systems  Constitutional:  Negative for chills, fever and malaise/fatigue.  HENT: Negative.    Eyes: Negative.   Respiratory: Negative.    Cardiovascular: Negative.   Gastrointestinal:  Positive for blood in stool (dark stool reported) and vomiting. Negative for abdominal pain, constipation and diarrhea.  Genitourinary: Negative.   Musculoskeletal: Negative.   Skin: Negative.   Neurological: Negative.   Endo/Heme/Allergies: Negative.   Psychiatric/Behavioral: Negative.      Physical Exam:  Vitals:   06/23/22 1448 06/23/22 1455 06/23/22 1507 06/23/22 1701  BP:   116/78 122/71  Pulse:   (!) 111 (!) 113  Resp:    (!) 32  Temp:   99.2 F (37.3 C) 98.8 F (37.1 C)  TempSrc:   Oral Oral  SpO2: 98%  97%   Weight:  82 kg    Height:  5\' 9"  (1.753 m)     Physical Exam Constitutional:      General: He is not in acute distress.    Appearance: He is not ill-appearing.  HENT:     Head: Normocephalic and  atraumatic.     Mouth/Throat:     Mouth: Mucous membranes are moist.  Eyes:     Extraocular Movements: Extraocular movements intact.  Cardiovascular:     Rate and Rhythm: Normal rate and regular rhythm.  Pulmonary:     Effort: Pulmonary effort is normal. No respiratory distress.     Breath sounds: Normal breath sounds. No wheezing.  Abdominal:     General: Bowel sounds are normal. There is no distension.     Palpations: Abdomen is soft.     Tenderness: There is no abdominal tenderness.  Musculoskeletal:        General: Swelling (trace LE edema) present. Normal range of motion.     Cervical back: Normal range of motion and neck supple.  Skin:    General: Skin is warm and dry.  Neurological:     Mental Status: He is alert.     Comments: Patient noncooperative with neuro exam  Psychiatric:     Comments: Odd affect      Labs on Admission:  I have personally reviewed following labs and imaging studies Results for orders placed or performed during the hospital encounter of 06/23/22 (from the past 24 hour(s))  CBC     Status: Abnormal   Collection Time: 06/23/22  3:17 PM  Result Value Ref Range   WBC 5.6 4.0 - 10.5 K/uL   RBC 2.61 (L) 4.22 - 5.81 MIL/uL   Hemoglobin 7.2 (L) 13.0 - 17.0 g/dL   HCT 23.8 (L) 39.0 - 52.0 %   MCV 91.2 80.0 - 100.0 fL   MCH 27.6 26.0 - 34.0 pg   MCHC 30.3 30.0 - 36.0 g/dL   RDW 16.7 (H) 11.5 - 15.5 %   Platelets 131 (L) 150 - 400 K/uL   nRBC 0.0 0.0 - 0.2 %  Comprehensive metabolic panel     Status: Abnormal   Collection Time: 06/23/22  3:17 PM  Result Value Ref Range   Sodium 146 (H) 135 - 145 mmol/L   Potassium 4.1 3.5 - 5.1 mmol/L   Chloride 118 (H) 98 - 111 mmol/L   CO2 25 22 - 32 mmol/L   Glucose, Bld 111 (H) 70 - 99 mg/dL   BUN 46 (H) 8 - 23 mg/dL   Creatinine, Ser  2.17 (H) 0.61 - 1.24 mg/dL   Calcium 8.3 (L) 8.9 - 10.3 mg/dL   Total Protein 6.2 (L) 6.5 - 8.1 g/dL   Albumin 2.9 (L) 3.5 - 5.0 g/dL   AST 28 15 - 41 U/L   ALT 27 0 - 44  U/L   Alkaline Phosphatase 66 38 - 126 U/L   Total Bilirubin 0.5 0.3 - 1.2 mg/dL   GFR, Estimated 34 (L) >60 mL/min   Anion gap 3 (L) 5 - 15  Protime-INR     Status: None   Collection Time: 06/23/22  3:17 PM  Result Value Ref Range   Prothrombin Time 13.8 11.4 - 15.2 seconds   INR 1.1 0.8 - 1.2  Type and screen     Status: None (Preliminary result)   Collection Time: 06/23/22  3:17 PM  Result Value Ref Range   ABO/RH(D) O POS    Antibody Screen NEG    Sample Expiration 06/26/2022,2359    Unit Number [id number]    Blood Component Type RBC LR PHER1    Unit division 00    Status of Unit ALLOCATED    Transfusion Status OK TO TRANSFUSE    Crossmatch Result Compatible    Unit Number [id number]    Blood Component Type RED CELLS,LR    Unit division 00    Status of Unit ISSUED    Transfusion Status OK TO TRANSFUSE    Crossmatch Result      Compatible Performed at [facility], [place] [address], [place], [place] [address]   Prepare RBC     Status: None   Collection Time: 06/23/22  3:59 PM  Result Value Ref Range   Order Confirmation      ORDER PROCESSED BY BLOOD BANK Performed at [facility] [address], [place], [place] [address]   POC occult blood, ED Provider will collect     Status: Abnormal   Collection Time: 06/23/22  4:50 PM  Result Value Ref Range   Fecal Occult Bld POSITIVE (A) NEGATIVE     Radiological Exams on Admission: No results found. No orders to display    Consults called:  GI   EKG: Independently reviewed. Sinus tach   Dwyane Dee, MD [facility] 06/23/2022, 5:08 PM

## 2022-06-23 NOTE — Assessment & Plan Note (Signed)
-   patient has history of CKD3b. Baseline creat ~ 1.5 - 1.7, eGFR~ 43 - patient presents with increase in creat >0.3 mg/dL above baseline, creat increase >1.5x baseline presumed to have occurred within past 7 days PTA -Patient continues to recover from prior renal injury - Creatinine 2.17 on admission which is improved from 4.26 on 06/20/2022 - continue diet

## 2022-06-23 NOTE — Assessment & Plan Note (Signed)
-   resume statin  °

## 2022-06-23 NOTE — Assessment & Plan Note (Signed)
-   Prior platelet count normal.  Suspect acutely low in setting of acute illness - Trend CBC

## 2022-06-23 NOTE — Assessment & Plan Note (Addendum)
-   Residual right-sided weakness -Hold aspirin

## 2022-06-23 NOTE — ED Provider Notes (Addendum)
[facility] AT [facility] Provider Note   CSN: [id number] Arrival date & time: 06/23/22  1435     History  Chief Complaint  Patient presents with   GI Bleeding    Brenten Huitron is a 62 y.o. male.  Pt with dark/black stool and episode coffee ground emesis today. Pt limited historian/level 5 caveat. Pt with hx prior cva with right weakness. Baby asa q day. No nsaid or other anticoagulant use. Pt denies hx pud. No abd pain. No fever or chills.no faintness.   The history is provided by the patient, medical records and the EMS personnel. The history is limited by the condition of the patient.       Home Medications Prior to Admission medications   Medication Sig Start Date End Date Taking? Authorizing Provider  acetaminophen (TYLENOL) 325 MG tablet Take 2 tablets (650 mg total) by mouth every 6 (six) hours as needed for mild pain (or Fever >/= 101). 06/20/22   Cherene Altes, MD  aspirin EC 81 MG EC tablet Take 1 tablet (81 mg total) by mouth daily. 06/15/19   Mitzi Hansen, MD  Cholecalciferol 25 MCG (1000 UT) TBDP Take 1,000 Units by mouth every morning.    [provider]  cyanocobalamin (VITAMIN B12) 1000 MCG tablet Take 1,000 mcg by mouth daily.    [provider]  FLUoxetine (PROZAC) 40 MG capsule Take 2 capsules (80 mg total) by mouth daily. 06/15/19   Mitzi Hansen, MD  folic acid (FOLVITE) 1 MG tablet Take 1 tablet (1 mg total) by mouth daily. 06/15/19   Mitzi Hansen, MD  hydrocerin (EUCERIN) CREA Apply 1 application topically 2 (two) times daily. 06/14/19   Mitzi Hansen, MD  melatonin 3 MG TABS tablet Take 6 mg by mouth at bedtime.    [provider]  mirtazapine (REMERON) 15 MG tablet Take 1 tablet (15 mg total) by mouth at bedtime. Patient not taking: Reported on 06/19/2022 06/14/19   Mitzi Hansen, MD  Multiple Vitamin (MULTIVITAMIN WITH MINERALS) TABS tablet Take 1 tablet by mouth daily. 06/15/19    Mitzi Hansen, MD  omeprazole (PRILOSEC) 20 MG capsule Take 20 mg by mouth every morning.    [provider]  ondansetron (ZOFRAN) 4 MG tablet Take 4 mg by mouth every 8 (eight) hours as needed for nausea or vomiting.    [provider]  polyethylene glycol (MIRALAX / GLYCOLAX) 17 g packet Take 17 g by mouth daily. 06/15/19   Mitzi Hansen, MD  rosuvastatin (CRESTOR) 40 MG tablet Take 1 tablet (40 mg total) by mouth daily at 6 PM. 06/14/19   Christian, Rylee, MD  senna-docusate (SENOKOT-S) 8.6-50 MG tablet Take 2 tablets by mouth at bedtime. 06/14/19   Mitzi Hansen, MD  thiamine 100 MG tablet Take 1 tablet (100 mg total) by mouth daily. 06/15/19   Christian, Rylee, MD  urea (CARMOL) 20 % cream Apply 1 application  topically daily. Apply to feet topically every day shift for dryness.    [provider]      Allergies    Patient has no known allergies.    Review of Systems   Review of Systems  Constitutional:  Negative for fever.  HENT:  Negative for nosebleeds.   Respiratory:  Negative for shortness of breath.   Cardiovascular:  Negative for chest pain.  Gastrointestinal:  Negative for abdominal pain.  Genitourinary:  Negative for flank pain.  Musculoskeletal:  Negative for back pain and neck pain.  Skin:  Negative for rash.  Neurological:  Negative for headaches.  Hematological:  Does not bruise/bleed easily.  Psychiatric/Behavioral:  Negative for confusion.     Physical Exam Updated Vital Signs BP 116/78 (BP Location: Right Arm)   Pulse (!) 111   Temp 99.2 F (37.3 C) (Oral)   Ht 1.753 m (5\' 9" )   Wt 82 kg   SpO2 97%   BMI 26.70 kg/m  Physical Exam Vitals and nursing note reviewed.  Constitutional:      Appearance: Normal appearance. He is well-developed.  HENT:     Head: Atraumatic.     Nose: Nose normal.     Mouth/Throat:     Mouth: Mucous membranes are moist.     Pharynx: Oropharynx is clear.  Eyes:     General: No scleral  icterus. Neck:     Trachea: No tracheal deviation.  Cardiovascular:     Rate and Rhythm: Regular rhythm. Tachycardia present.     Pulses: Normal pulses.     Heart sounds: Normal heart sounds. No murmur heard.    No friction rub. No gallop.  Pulmonary:     Effort: Pulmonary effort is normal. No accessory muscle usage or respiratory distress.     Breath sounds: Normal breath sounds.  Abdominal:     General: Bowel sounds are normal. There is no distension.     Palpations: Abdomen is soft. There is no mass.     Tenderness: There is no abdominal tenderness. There is no guarding.  Genitourinary:    Comments: Black stool sent for hemoccult.  Musculoskeletal:        General: No swelling.     Cervical back: Normal range of motion and neck supple. No rigidity.  Skin:    General: Skin is warm and dry.     Findings: No rash.  Neurological:     Mental Status: He is alert.     Comments: Alert, speech clear. Right weakness.   Psychiatric:        Mood and Affect: Mood normal.     ED Results / Procedures / Treatments   Labs (all labs ordered are listed, but only abnormal results are displayed) Results for orders placed or performed during the hospital encounter of 06/23/22  CBC  Result Value Ref Range   WBC 5.6 4.0 - 10.5 K/uL   RBC 2.61 (L) 4.22 - 5.81 MIL/uL   Hemoglobin 7.2 (L) 13.0 - 17.0 g/dL   HCT 23.8 (L) 39.0 - 52.0 %   MCV 91.2 80.0 - 100.0 fL   MCH 27.6 26.0 - 34.0 pg   MCHC 30.3 30.0 - 36.0 g/dL   RDW 16.7 (H) 11.5 - 15.5 %   Platelets 131 (L) 150 - 400 K/uL   nRBC 0.0 0.0 - 0.2 %  Comprehensive metabolic panel  Result Value Ref Range   Sodium 146 (H) 135 - 145 mmol/L   Potassium 4.1 3.5 - 5.1 mmol/L   Chloride 118 (H) 98 - 111 mmol/L   CO2 25 22 - 32 mmol/L   Glucose, Bld 111 (H) 70 - 99 mg/dL   BUN 46 (H) 8 - 23 mg/dL   Creatinine, Ser 2.17 (H) 0.61 - 1.24 mg/dL   Calcium 8.3 (L) 8.9 - 10.3 mg/dL   Total Protein 6.2 (L) 6.5 - 8.1 g/dL   Albumin 2.9 (L) 3.5 - 5.0  g/dL   AST 28 15 - 41 U/L   ALT 27 0 - 44 U/L   Alkaline Phosphatase  66 38 - 126 U/L   Total Bilirubin 0.5 0.3 - 1.2 mg/dL   GFR, Estimated 34 (L) >60 mL/min   Anion gap 3 (L) 5 - 15  Protime-INR  Result Value Ref Range   Prothrombin Time 13.8 11.4 - 15.2 seconds   INR 1.1 0.8 - 1.2  Type and screen  Result Value Ref Range   ABO/RH(D) O POS    Antibody Screen NEG    Sample Expiration      06/26/2022,2359 Performed at [facility], [place] [address], [place], [place] [address]    Unit Number [id number]    Blood Component Type RBC LR PHER1    Unit division 00    Status of Unit ALLOCATED    Transfusion Status OK TO TRANSFUSE    Crossmatch Result Compatible    Unit Number [id number]    Blood Component Type RED CELLS,LR    Unit division 00    Status of Unit ALLOCATED    Transfusion Status OK TO TRANSFUSE    Crossmatch Result Compatible   Prepare RBC  Result Value Ref Range   Order Confirmation      ORDER PROCESSED BY BLOOD BANK Performed at [facility], [place] [address], [place], [place] [address]   BPAM [facility]  Result Value Ref Range   Blood Product Unit Number [id number]    PRODUCT CODE [id number]    Unit Type and Rh 5100    Blood Product Expiration Date R1568964    Blood Product Unit Number [id number]    PRODUCT CODE [id number]    Unit Type and Rh 5100    Blood Product Expiration Date R1568964    US Renal  Result Date: 06/18/2022 CLINICAL DATA:  Acute kidney injury EXAM: RENAL / URINARY TRACT ULTRASOUND COMPLETE COMPARISON:  CT AP 01/18/19 FINDINGS: Right Kidney: Right kidney was not visualized. Left Kidney: Renal measurements: 11.0 x 5.8 x 5.1 cm = volume: 171 mL. Echogenicity is slightly increased. No mass or hydronephrosis visualized. Bladder: Appears normal for degree of bladder distention. Other: None. IMPRESSION: 1. Right kidney was not visualized. 2. Slightly increased echogenicity of the left kidney may be seen in  medical renal disease. No hydronephrosis or nephrolithiasis. Electronically Signed   By: Marin Roberts M.D.   On: 06/18/2022 15:29   DG Chest Port 1 View  Result Date: 06/18/2022 CLINICAL DATA:  Sepsis EXAM: PORTABLE CHEST 1 VIEW COMPARISON:  CXR 03/29/19 FINDINGS: No pleural effusion. No pneumothorax. Low lung volumes. Unchanged cardiac and mediastinal contours. No focal airspace opacity. No radiographically apparent displaced rib fractures. Visualized upper abdomen is unremarkable. IMPRESSION: Low lung volumes. No acute cardiopulmonary disease. Electronically Signed   By: Marin Roberts M.D.   On: 06/18/2022 14:28     EKG EKG Interpretation  Date/Time:  Sunday June 23 2022 15:51:24 EDT Ventricular Rate:  110 PR Interval:  145 QRS Duration: 122 QT Interval:  368 QTC Calculation: 498 R Axis:   216 Text Interpretation: Sinus tachycardia Right bundle branch block Non-specific ST-t changes Confirmed by Lajean Saver [phone]) on 06/23/2022 3:58:01 PM  Radiology No results found.  Procedures Procedures    Medications Ordered in ED Medications  pantoprozole (PROTONIX) 80 mg /NS 100 mL infusion (0 mg/hr Intravenous Stopped 06/23/22 1559)  pantoprazole (PROTONIX) injection 40 mg (has no administration in time range)  0.9 %  sodium chloride infusion (Manually program via Guardrails IV Fluids) (has no administration in time range)  pantoprazole (PROTONIX) 80 mg /NS 100 mL IVPB (0  mg Intravenous Stopped 06/23/22 1602)    ED Course/ Medical Decision Making/ A&P                             Medical Decision Making Problems Addressed: Acute GI bleeding: acute illness or injury with systemic symptoms that poses a threat to life or bodily functions Coffee ground emesis: acute illness or injury with systemic symptoms that poses a threat to life or bodily functions Melena: acute illness or injury Stage 4 chronic kidney disease ([place]): chronic illness or injury with exacerbation, progression, or  side effects of treatment that poses a threat to life or bodily functions Symptomatic anemia: acute illness or injury with systemic symptoms that poses a threat to life or bodily functions  Amount and/or Complexity of Data Reviewed Independent Historian: EMS    Details: hx External Data Reviewed: labs and notes. Labs: ordered. Decision-making details documented in ED Course. Radiology: ordered and independent interpretation performed. Decision-making details documented in ED Course. ECG/medicine tests: ordered and independent interpretation performed. Decision-making details documented in ED Course. Discussion of management or test interpretation with external provider(s): Hosp, gi  Risk Prescription drug management. Decision regarding hospitalization.   Iv ns. Continuous pulse ox and cardiac monitoring. Labs ordered/sent.   Differential diagnosis includes gi bleeding, pud, symptomatic anemia, etc . Dispo decision including potential need for admission considered - will get labs and reassess.   Reviewed nursing notes and prior charts for additional history. External reports reviewed. Additional history from: EMS.   Cardiac monitor: sinus rhythm, rate 110.  Labs reviewed/interpreted by me - hgb 7, ~ 4 gm drop as compared to recent labs.   Protonix bolus/gtt. Prbc transfusion ordered.  Hospitalists consulted for admission. GI consulted. Discussed pt, they will see - they indicate clears today, npo past MN.  CRITICAL CARE RE Acute gi bleeding w tachycardia, faintness, requiring emergent prbc transfusion.  Performed by: Mirna Mires Total critical care time: 110 minutes Critical care time was exclusive of separately billable procedures and treating other patients. Critical care was necessary to treat or prevent imminent or life-threatening deterioration. Critical care was time spent personally by me on the following activities: development of treatment plan with patient and/or  surrogate as well as nursing, discussions with consultants, evaluation of patient's response to treatment, examination of patient, obtaining history from patient or surrogate, ordering and performing treatments and interventions, ordering and review of laboratory studies, ordering and review of radiographic studies, pulse oximetry and re-evaluation of patient's condition.  Recheck, pt awake and alert. Tachycardia mildly improved. Abd soft nt.   Hospitalists and gi coming to see.           Final Clinical Impression(s) / ED Diagnoses Final diagnoses:  Acute GI bleeding  Stage 4 chronic kidney disease (HCC)  Melena  Symptomatic anemia  Coffee ground emesis    Rx / DC Orders ED Discharge Orders     None          Lajean Saver, MD 06/23/22 1646

## 2022-06-23 NOTE — ED Notes (Signed)
ED TO INPATIENT HANDOFF REPORT  ED Nurse Name and Phone #: Tia, RN  S Name/Age/Gender Philip Vasquez 62 y.o. male Room/Bed: WA25/WA25  Code Status   Code Status: Full Code  Home/SNF/Other Skilled nursing facility Patient oriented to: self, place, time, and situation Is this baseline? Yes   Triage Complete: Triage complete  Chief Complaint GIB (gastrointestinal bleeding) [K92.2]  Triage Note Patient brought in by EMS from [facility] for GI bleed. Per EMS patient has had ABD pain since breakfast this morning, dark stools and had vomited coffee grind vomit. He is A&O, refuses to talk to staff per EMS patient stated he was done answering questions. HX of stroke with R side weakness. EMS started 20g IV in L AC and gave 364ml of NS, patient denied nausea.  CBG:127 118/82 98% RA   Allergies No Known Allergies  Level of Care/Admitting Diagnosis ED Disposition     ED Disposition  Admit   Condition  --   Comment  Hospital Area: [facility] [[id number]]  Level of Care: Progressive [102]  Admit to Progressive based on following criteria: GI, ENDOCRINE disease patients with GI bleeding, acute liver failure or pancreatitis, stable with diabetic ketoacidosis or thyrotoxicosis (hypothyroid) state.  May admit patient to [facility] or [facility] if equivalent level of care is available:: No  Covid Evaluation: Asymptomatic - no recent exposure (last 10 days) testing not required  Diagnosis: GIB (gastrointestinal bleeding) [id number]  Admitting Physician: Dwyane Dee [phone]  Attending Physician: Dwyane Dee [id number]  Certification:: I certify this patient will need inpatient services for at least 2 midnights  Estimated Length of Stay: 2          B Medical/Surgery History Past Medical History:  Diagnosis Date   Chronic kidney disease, stage 3a ([place])    Cocaine use disorder ([place]) 12/21/2018   History of CVA with residual deficit    residual  right side weakness   Normocytic anemia    No past surgical history on file.   A IV Location/Drains/Wounds Patient Lines/Drains/Airways Status     Active Line/Drains/Airways     Name Placement date Placement time Site Days   Peripheral IV 06/23/22 20 G 1" Left Antecubital 06/23/22  --  Antecubital  less than 1   External Urinary Catheter 06/18/22  1334  --  5   Pressure Injury 06/18/22 Buttocks Right Stage 2 -  Partial thickness loss of dermis presenting as a shallow open injury with a red, pink wound bed without slough. 06/18/22  2230  -- 5            Intake/Output Last 24 hours  Intake/Output Summary (Last 24 hours) at 06/23/2022 1710 Last data filed at 06/23/2022 1602 Gross per 24 hour  Intake 187.7 ml  Output --  Net 187.7 ml    Labs/Imaging Results for orders placed or performed during the hospital encounter of 06/23/22 (from the past 48 hour(s))  CBC     Status: Abnormal   Collection Time: 06/23/22  3:17 PM  Result Value Ref Range   WBC 5.6 4.0 - 10.5 K/uL   RBC 2.61 (L) 4.22 - 5.81 MIL/uL   Hemoglobin 7.2 (L) 13.0 - 17.0 g/dL   HCT 23.8 (L) 39.0 - 52.0 %   MCV 91.2 80.0 - 100.0 fL   MCH 27.6 26.0 - 34.0 pg   MCHC 30.3 30.0 - 36.0 g/dL   RDW 16.7 (H) 11.5 - 15.5 %   Platelets 131 (L) 150 -  400 K/uL   nRBC 0.0 0.0 - 0.2 %    Comment: Performed at [facility], [place] [address] [place], [place] [address]  Comprehensive metabolic panel     Status: Abnormal   Collection Time: 06/23/22  3:17 PM  Result Value Ref Range   Sodium 146 (H) 135 - 145 mmol/L    Comment: ELECTROLYTES REPEATED TO VERIFY   Potassium 4.1 3.5 - 5.1 mmol/L   Chloride 118 (H) 98 - 111 mmol/L    Comment: ELECTROLYTES REPEATED TO VERIFY   CO2 25 22 - 32 mmol/L    Comment: ELECTROLYTES REPEATED TO VERIFY   Glucose, Bld 111 (H) 70 - 99 mg/dL    Comment: Glucose reference range applies only to samples taken after fasting for at least 8 hours.   BUN 46 (H) 8 - 23 mg/dL    Creatinine, Ser 2.17 (H) 0.61 - 1.24 mg/dL   Calcium 8.3 (L) 8.9 - 10.3 mg/dL   Total Protein 6.2 (L) 6.5 - 8.1 g/dL   Albumin 2.9 (L) 3.5 - 5.0 g/dL   AST 28 15 - 41 U/L   ALT 27 0 - 44 U/L   Alkaline Phosphatase 66 38 - 126 U/L   Total Bilirubin 0.5 0.3 - 1.2 mg/dL   GFR, Estimated 34 (L) >60 mL/min    Comment: (NOTE) Calculated using the CKD-EPI Creatinine Equation (2021)    Anion gap 3 (L) 5 - 15    Comment: ELECTROLYTES REPEATED TO VERIFY Performed at [place] [address] [place], [place] [address]   Protime-INR     Status: None   Collection Time: 06/23/22  3:17 PM  Result Value Ref Range   Prothrombin Time 13.8 11.4 - 15.2 seconds   INR 1.1 0.8 - 1.2    Comment: (NOTE) INR goal varies based on device and disease states. Performed at [facility], [place] [address], [place], [place] [address]   Type and screen     Status: None (Preliminary result)   Collection Time: 06/23/22  3:17 PM  Result Value Ref Range   ABO/RH(D) O POS    Antibody Screen NEG    Sample Expiration 06/26/2022,2359    Unit Number [id number]    Blood Component Type RBC LR PHER1    Unit division 00    Status of Unit ALLOCATED    Transfusion Status OK TO TRANSFUSE    Crossmatch Result Compatible    Unit Number [id number]    Blood Component Type RED CELLS,LR    Unit division 00    Status of Unit ISSUED    Transfusion Status OK TO TRANSFUSE    Crossmatch Result      Compatible Performed at [facility], [place] [address], [place], [place] [address]   Prepare RBC     Status: None   Collection Time: 06/23/22  3:59 PM  Result Value Ref Range   Order Confirmation      ORDER PROCESSED BY BLOOD BANK Performed at [place] [address] [place], [place] [address]   POC occult blood, ED Provider will collect     Status: Abnormal   Collection Time: 06/23/22  4:50 PM  Result Value Ref Range   Fecal Occult Bld  POSITIVE (A) NEGATIVE   No results found.  Pending Labs Unresulted Labs (From admission, onward)     Start     Ordered   06/23/22 2000  Hemoglobin and hematocrit, blood  Now  then every 8 hours,   R (with TIMED occurrences)      06/23/22 1658            Vitals/Pain Today's Vitals   06/23/22 1448 06/23/22 1455 06/23/22 1507 06/23/22 1701  BP:   116/78 122/71  Pulse:   (!) 111 (!) 113  Resp:    (!) 32  Temp:   99.2 F (37.3 C) 98.8 F (37.1 C)  TempSrc:   Oral Oral  SpO2: 98%  97%   Weight:  82 kg    Height:  5\' 9"  (1.753 m)    PainSc:  0-No pain 0-No pain     Isolation Precautions No active isolations  Medications Medications  pantoprozole (PROTONIX) 80 mg /NS 100 mL infusion (0 mg/hr Intravenous Stopped 06/23/22 1559)  pantoprazole (PROTONIX) injection 40 mg (has no administration in time range)  pantoprazole (PROTONIX) 80 mg /NS 100 mL IVPB (0 mg Intravenous Stopped 06/23/22 1602)  0.9 %  sodium chloride infusion (Manually program via Guardrails IV Fluids) ( Intravenous New Bag/Given 06/23/22 1659)    Mobility non-ambulatory     Focused Assessments Neuro Assessment Handoff:  Swallow screen pass?  N/A Cardiac Rhythm: Normal sinus rhythm       Neuro Assessment:   Neuro Checks:      Has TPA been given? No If patient is a Neuro Trauma and patient is going to OR before floor call report to [place] nurse: [phone] or [phone]  ,    R Recommendations: See Admitting Provider Note  Report given to:   Additional Notes: Patient told EMS he was not going to answer anymore questions he has not answered any for me just nod his head yes or no the only word he said was. "Turn on my TV."

## 2022-06-23 NOTE — Assessment & Plan Note (Signed)
-   Continue home regimen 

## 2022-06-24 ENCOUNTER — Encounter (HOSPITAL_COMMUNITY): Payer: Self-pay | Admitting: Internal Medicine

## 2022-06-24 ENCOUNTER — Inpatient Hospital Stay (HOSPITAL_COMMUNITY): Payer: Medicaid Other | Admitting: Certified Registered"

## 2022-06-24 ENCOUNTER — Encounter (HOSPITAL_COMMUNITY)
Admission: EM | Disposition: A | Discharge status: Skilled Nursing Facility | Payer: Self-pay | Source: Skilled Nursing Facility | Attending: Internal Medicine

## 2022-06-24 DIAGNOSIS — I10 Essential (primary) hypertension: Secondary | ICD-10-CM

## 2022-06-24 DIAGNOSIS — D649 Anemia, unspecified: Secondary | ICD-10-CM

## 2022-06-24 DIAGNOSIS — K922 Gastrointestinal hemorrhage, unspecified: Secondary | ICD-10-CM

## 2022-06-24 DIAGNOSIS — K2971 Gastritis, unspecified, with bleeding: Secondary | ICD-10-CM | POA: Diagnosis not present

## 2022-06-24 DIAGNOSIS — R4589 Other symptoms and signs involving emotional state: Secondary | ICD-10-CM | POA: Diagnosis not present

## 2022-06-24 DIAGNOSIS — Z8673 Personal history of transient ischemic attack (TIA), and cerebral infarction without residual deficits: Secondary | ICD-10-CM

## 2022-06-24 DIAGNOSIS — N179 Acute kidney failure, unspecified: Secondary | ICD-10-CM | POA: Diagnosis not present

## 2022-06-24 DIAGNOSIS — K264 Chronic or unspecified duodenal ulcer with hemorrhage: Secondary | ICD-10-CM | POA: Diagnosis not present

## 2022-06-24 DIAGNOSIS — N1832 Chronic kidney disease, stage 3b: Secondary | ICD-10-CM | POA: Diagnosis not present

## 2022-06-24 HISTORY — PX: ESOPHAGOGASTRODUODENOSCOPY (EGD) WITH PROPOFOL: SHX5813

## 2022-06-24 HISTORY — PX: BIOPSY: SHX5522

## 2022-06-24 LAB — HEMOGLOBIN AND HEMATOCRIT, BLOOD
HCT: 37.6 % — ABNORMAL LOW (ref 39.0–52.0)
HCT: 38 % — ABNORMAL LOW (ref 39.0–52.0)
HCT: 36.8 % — ABNORMAL LOW (ref 39.0–52.0)
Hemoglobin: 11.7 g/dL — ABNORMAL LOW (ref 13.0–17.0)
Hemoglobin: 11.5 g/dL — ABNORMAL LOW (ref 13.0–17.0)
Hemoglobin: 11.2 g/dL — ABNORMAL LOW (ref 13.0–17.0)

## 2022-06-24 LAB — TYPE AND SCREEN
ABO/RH(D): O POS
Antibody Screen: NEGATIVE
Unit division: 0
Unit division: 0

## 2022-06-24 LAB — BPAM RBC
Blood Product Expiration Date: 202404192359
Blood Product Expiration Date: 202404192359
ISSUE DATE / TIME: 202403171653
ISSUE DATE / TIME: 202403172021
Unit Type and Rh: 5100
Unit Type and Rh: 5100

## 2022-06-24 LAB — BASIC METABOLIC PANEL
Anion gap: 10 (ref 5–15)
BUN: 37 mg/dL — ABNORMAL HIGH (ref 8–23)
CO2: 23 mmol/L (ref 22–32)
Calcium: 8.3 mg/dL — ABNORMAL LOW (ref 8.9–10.3)
Chloride: 112 mmol/L — ABNORMAL HIGH (ref 98–111)
Creatinine, Ser: 2.24 mg/dL — ABNORMAL HIGH (ref 0.61–1.24)
GFR, Estimated: 33 mL/min — ABNORMAL LOW (ref >60)
Glucose, Bld: 78 mg/dL (ref 70–99)
Potassium: 3.5 mmol/L (ref 3.5–5.1)
Sodium: 145 mmol/L (ref 135–145)

## 2022-06-24 LAB — MAGNESIUM: Magnesium: 1.8 mg/dL (ref 1.7–2.4)

## 2022-06-24 SURGERY — ESOPHAGOGASTRODUODENOSCOPY (EGD) WITH PROPOFOL
Anesthesia: Monitor Anesthesia Care

## 2022-06-24 MED ORDER — LIDOCAINE 2% (20 MG/ML) 5 ML SYRINGE
INTRAMUSCULAR | Status: DC | PRN
  Administered 2022-06-24: 100 mg via INTRAVENOUS

## 2022-06-24 MED ORDER — PROPOFOL 10 MG/ML IV BOLUS
INTRAVENOUS | Status: DC | PRN
  Administered 2022-06-24: 20 mg via INTRAVENOUS

## 2022-06-24 MED ORDER — ESMOLOL HCL 100 MG/10ML IV SOLN
INTRAVENOUS | Status: DC | PRN
  Administered 2022-06-24: 30 mg via INTRAVENOUS

## 2022-06-24 MED ORDER — SODIUM CHLORIDE 0.9 % IV SOLN: INTRAVENOUS | Status: DC

## 2022-06-24 MED ORDER — PROPOFOL 10 MG/ML IV BOLUS
INTRAVENOUS | Status: AC
  Filled 2022-06-24: qty 20

## 2022-06-24 MED ORDER — PANTOPRAZOLE SODIUM 40 MG PO TBEC: DELAYED_RELEASE_TABLET | ORAL | 1 refills | Status: AC

## 2022-06-24 MED ORDER — PROPOFOL 500 MG/50ML IV EMUL
INTRAVENOUS | Status: DC | PRN
  Administered 2022-06-24: 140 ug/kg/min via INTRAVENOUS

## 2022-06-24 SURGICAL SUPPLY — 15 items

## 2022-06-24 NOTE — Op Note (Signed)
[facility] Patient Name: Philip Vasquez Procedure Date: 06/24/2022 MRN: [id number] Attending MD: Ronnette Juniper , MD, [id number] Date of Birth: 1960/06/22 CSN: [id number] Age: 62 Admit Type: Inpatient Procedure:                Upper GI endoscopy Indications:              Acute post hemorrhagic anemia, Hematemesis, Melena Providers:                Ronnette Juniper, MD, Doristine Johns, RN, William Dalton, Technician Referring MD:             [facility] Medicines:                Monitored Anesthesia Care Complications:            No immediate complications. Estimated blood loss:                            Minimal. Estimated Blood Loss:     Estimated blood loss was minimal. Procedure:                Pre-Anesthesia Assessment:                           - Prior to the procedure, a History and Physical                            was performed, and patient medications and                            allergies were reviewed. The patient's tolerance of                            previous anesthesia was also reviewed. The risks                            and benefits of the procedure and the sedation                            options and risks were discussed with the patient.                            All questions were answered, and informed consent                            was obtained. Prior Anticoagulants: The patient has                            taken no anticoagulant or antiplatelet agents                            except for aspirin. ASA Grade Assessment: III - A  patient with severe systemic disease. After                            reviewing the risks and benefits, the patient was                            deemed in satisfactory condition to undergo the                            procedure.                           After obtaining informed consent, the endoscope was                            passed under direct  vision. Throughout the                            procedure, the patient's blood pressure, pulse, and                            oxygen saturations were monitored continuously. The                            [id number]) Olympus endoscope was introduced                            through the mouth, and advanced to the second part                            of duodenum. The upper GI endoscopy was                            accomplished without difficulty. The patient                            tolerated the procedure well. Scope In: Scope Out: Findings:      The examined esophagus was normal.      The Z-line was regular and was found 40 cm from the incisors.      Many non-bleeding superficial gastric ulcers with a clean ulcer base       (Forrest Class III) were found in the gastric body and in the gastric       antrum. The largest lesion was 4 mm in largest dimension. Biopsies were       taken with a cold forceps for Helicobacter pylori testing.      The cardia and gastric fundus were normal on retroflexion.      Two non-bleeding cratered duodenal ulcers with a clean ulcer base       (Forrest Class III) were found in the duodenal bulb. The largest lesion       was 10 mm in largest dimension. Impression:               - Normal esophagus.                           -  Z-line regular, 40 cm from the incisors.                           - Non-bleeding gastric ulcers with a clean ulcer                            base (Forrest Class III). Biopsied.                           - Non-bleeding duodenal ulcers with a clean ulcer                            base (Forrest Class III). Moderate Sedation:      Patient did not receive moderate sedation for this procedure, but       instead received monitored anesthesia care. Recommendation:           - Resume regular diet.                           - Continue present medications.                           - Await pathology results. Treat H pylori if  found.                           - PPI BID for 2 months and then once a day                            indefinitely as patient needs to be on ASA 81 mg a                            day. Procedure Code(s):        --- Professional ---                           [phone], Esophagogastroduodenoscopy, flexible,                            transoral; with biopsy, single or multiple Diagnosis Code(s):        --- Professional ---                           K25.9, Gastric ulcer, unspecified as acute or                            chronic, without hemorrhage or perforation                           K26.9, Duodenal ulcer, unspecified as acute or                            chronic, without hemorrhage or perforation                           D62, Acute posthemorrhagic anemia  K92.0, Hematemesis                           K92.1, Melena (includes Hematochezia) CPT copyright 2022 American [employer] Association. All rights reserved. The codes documented in this report are preliminary and upon coder review may  be revised to meet current compliance requirements. Ronnette Juniper, MD 06/24/2022 8:47:50 AM This report has been signed electronically. Number of Addenda: 0

## 2022-06-24 NOTE — Discharge Summary (Signed)
Physician Discharge Summary   Philip Vasquez [id number] DOB: 1960/12/17 DOA: 06/23/2022  PCP: Patient, No Pcp Per  Admit date: 06/23/2022 Discharge date: 06/24/2022  Admitted From: SNF Disposition:  SNF Discharging physician: Dwyane Dee, MD Barriers to discharge: none  Recommendations for Outpatient Follow-up:  Continue BID protonix x 2 months then daily Consider psych referral if refuses treatments   Discharge Condition: stable CODE STATUS: Full Diet recommendation:  Diet Orders (From admission, onward)     Start     Ordered   06/24/22 0949  Diet regular Fluid consistency: Thin  Diet effective now       Question:  Fluid consistency:  Answer:  Thin   06/24/22 0948   06/24/22 0000  Diet general        06/24/22 1141            Hospital Course: Philip Vasquez is a 62 year old male with PMH CVA with residual right side weakness, CKD3a, normocytic anemia who presented after developing multiple episodes of hematemesis and dark stools today.  He was feeling in his normal state of health yesterday.  He denied any nausea, only vomiting symptoms.  He also denies any diarrhea.  He has no prior history of similar. His affect is odd in the ER and he admits to drinking "a lot" of beer but would not quantify however patient does reside at [facility] (will attempt to clarify if he does infact drink beer there). He denied any tobacco use.   Of note, he was recently hospitalized from  3/12 to 3/14 for dehydration resulting in acute on chronic renal failure (creat 10.88 on admission, discharged with creat 4.26).  Prior to discharge, he was found to be intermittently refusing to eat or drink.  He was recommended to have psychiatry evaluation if this were to recur.  He underwent further workup in the ER regarding suspected GI bleed.  BP 116/78.  Mild tachycardia, 111. Hgb 7.2 g/dL and he was discharged with hemoglobin 11.1 g/dL on 06/20/2022. Renal function showed further improvement,  creatinine on workup 2.17 and was previously 4.26 on 06/20/2022. He was having no further hematemesis or dark stools during workup in the ER therefore no further imaging was pursued. GI was consulted from the ER and patient being admitted for further workup.  As noted above, his affect was very odd.  At first he did not want to answer many of my questions but then began doing so. He stated "I asked a lot of personal questions." He also did not allow physical exam initially but as I was leaving room changed his mind.  He underwent EGD on 3/18 with GI. See results below. Recommended to continue on protonix 40 mg BID for 1 month then daily thereafter.   Assessment and Plan: * GIB (gastrointestinal bleeding) - Patient endorsing approximately 3 episodes of vomiting "red" blood.  No nausea prior.  Appetite has been okay.  Patient does endorse questionable excessive alcohol use. Patient denied OTC NASIDs and none listed on med rec aside from aspirin use -Baseline hemoglobin appears to be around 11 g/dL which he was discharged at on 06/20/2022.  He presents with hemoglobin 7.2 g/dL - Currently blood pressure stable - Trend hemoglobin; ER has ordered 2 units PRBC - Hgb improved to 11.5 and stable after transfusions - s/p EGD on 3/18: multiple non bleeding superficial gastric ulcers and 2 non bleeding duodenal ulcers with clean base. Recommended to continue BID protonix x 2 months then daily per GI  Acute renal  failure superimposed on stage 3b chronic kidney disease ([place]) - patient has history of CKD3b. Baseline creat ~ 1.5 - 1.7, eGFR~ 43 - patient presents with increase in creat >0.3 mg/dL above baseline, creat increase >1.5x baseline presumed to have occurred within past 7 days PTA -Patient continues to recover from prior renal injury - Creatinine 2.17 on admission which is improved from 4.26 on 06/20/2022 - continue diet   Disturbance in affect - Patient displaying odd affect during assessment and  ER.  He was noted to also be refusing to eat previously at his SNF and also during prior hospitalization.  Psychiatry evaluation was recommended if continued -remains with odd affect; still consider psych workup at discharge if does deny treatments etc - patient compliant in hospital   Normocytic anemia - See GI bleed above.  Baseline hemoglobin around 11 g/dL typically  History of CVA with residual deficit - Residual right-sided weakness - asa resumed s/p EGD  Thrombocytopenia (HCC) - Prior platelet count normal.  Suspect acutely low in setting of acute illness - Trend CBC  Depression - Continue home regimen  Hyperlipidemia - resume statin  Hypertension - No home meds noted.  Hold regardless in setting of GI bleed    Principal Diagnosis: GIB (gastrointestinal bleeding)  Discharge Diagnoses: Active Hospital Problems   Diagnosis Date Noted   GIB (gastrointestinal bleeding) 06/23/2022    Priority: 1.   Acute renal failure superimposed on stage 3b chronic kidney disease ([place]) 06/18/2022    Priority: 2.   Disturbance in affect 06/23/2022    Priority: 3.   Normocytic anemia 06/18/2022    Priority: 4.   History of CVA with residual deficit 06/18/2022    Priority: 6.   Thrombocytopenia ([place]) 06/23/2022   Depression    Hyperlipidemia 12/21/2018   Hypertension 12/21/2018    Resolved Hospital Problems  No resolved problems to display.     Discharge Instructions     Diet general   Complete by: As directed    Increase activity slowly   Complete by: As directed    No wound care   Complete by: As directed       Allergies as of 06/24/2022   No Known Allergies      Medication List     STOP taking these medications    omeprazole 20 MG capsule Commonly known as: PRILOSEC       TAKE these medications    acetaminophen 325 MG tablet Commonly known as: TYLENOL Take 2 tablets (650 mg total) by mouth every 6 (six) hours as needed for mild pain (or Fever >/=  101).   aspirin EC 81 MG tablet Take 1 tablet (81 mg total) by mouth daily.   Cholecalciferol 25 MCG (1000 UT) Tbdp Take 1,000 Units by mouth every morning.   cyanocobalamin 1000 MCG tablet Commonly known as: VITAMIN B12 Take 1,000 mcg by mouth daily.   FLUoxetine 40 MG capsule Commonly known as: PROZAC Take 2 capsules (80 mg total) by mouth daily.   folic acid 1 MG tablet Commonly known as: FOLVITE Take 1 tablet (1 mg total) by mouth daily.   hydrocerin Crea Apply 1 application topically 2 (two) times daily.   melatonin 3 MG Tabs tablet Take 6 mg by mouth at bedtime.   mirtazapine 15 MG tablet Commonly known as: REMERON Take 1 tablet (15 mg total) by mouth at bedtime.   multivitamin with minerals Tabs tablet Take 1 tablet by mouth daily.   ondansetron 4 MG tablet Commonly  known as: ZOFRAN Take 4 mg by mouth every 8 (eight) hours as needed for nausea or vomiting.   pantoprazole 40 MG tablet Commonly known as: Protonix 1 tablet BID for 2 months then daily indefinitely   polyethylene glycol 17 g packet Commonly known as: MIRALAX / GLYCOLAX Take 17 g by mouth daily.   rosuvastatin 40 MG tablet Commonly known as: CRESTOR Take 1 tablet (40 mg total) by mouth daily at 6 PM.   senna-docusate 8.6-50 MG tablet Commonly known as: Senokot-S Take 2 tablets by mouth at bedtime.   thiamine 100 MG tablet Commonly known as: VITAMIN B1 Take 1 tablet (100 mg total) by mouth daily.   urea 20 % cream Commonly known as: CARMOL Apply 1 application  topically daily. Apply to feet topically every day shift for dryness.        No Known Allergies  Consultations: GI  Procedures: 3/18: EGD  Discharge Exam: BP 129/87 (BP Location: Left Wrist)   Pulse 88   Temp 98 F (36.7 C) (Oral)   Resp 16   Ht 5\' 9"  (1.753 m)   Wt 81.9 kg   SpO2 100%   BMI 26.66 kg/m  Physical Exam Constitutional:      Appearance: Normal appearance.  HENT:     Head: Normocephalic and  atraumatic.     Mouth/Throat:     Mouth: Mucous membranes are moist.  Eyes:     Extraocular Movements: Extraocular movements intact.  Cardiovascular:     Rate and Rhythm: Normal rate and regular rhythm.  Pulmonary:     Effort: Pulmonary effort is normal.     Breath sounds: Normal breath sounds.  Abdominal:     General: Bowel sounds are normal. There is no distension.     Palpations: Abdomen is soft.     Tenderness: There is no abdominal tenderness.  Musculoskeletal:     Cervical back: Normal range of motion and neck supple.     Comments: Trace LE edema  Skin:    General: Skin is warm.  Neurological:     Mental Status: He is alert.     Comments: Not allowing neuro exam still  Psychiatric:     Comments: Odd affect      The results of significant diagnostics from this hospitalization (including imaging, microbiology, ancillary and laboratory) are listed below for reference.   Microbiology: Recent Results (from the past 240 hour(s))  Urine Culture (for pregnant, neutropenic or urologic patients or patients with an indwelling urinary catheter)     Status: None   Collection Time: 06/18/22  9:42 PM   Specimen: Urine, Clean Catch  Result Value Ref Range Status   Specimen Description URINE, CLEAN CATCH  Final   Special Requests NONE  Final   Culture   Final    NO GROWTH Performed at [facility], [place] [address], [place], [place] [address]    Report Status 06/19/2022 FINAL  Final     Labs: BNP (last 3 results) Recent Labs    06/18/22 1255  BNP [id number]   Basic Metabolic Panel: Recent Labs  Lab 06/18/22 1255 06/19/22 0307 06/20/22 0813 06/23/22 1517 06/24/22 0507  NA 134* 139 146* 146* 145  K 4.1 4.0 4.1 4.1 3.5  CL 97* 107 114* 118* 112*  CO2 19* 16* 21* 25 23  GLUCOSE 100* 98 91 111* 78  BUN 150* 122* 78* 46* 37*  CREATININE 10.88* 7.51* 4.26* 2.17* 2.24*  CALCIUM 8.9 8.3* 8.5* 8.3* 8.3*  MG  --   --   --   --  1.8  PHOS  --   --  3.7  --   --    Liver  Function Tests: Recent Labs  Lab 06/18/22 1255 06/20/22 0813 06/23/22 1517  AST 13*  --  28  ALT 16  --  27  ALKPHOS 77  --  66  BILITOT 0.5  --  0.5  PROT 6.3*  --  6.2*  ALBUMIN 2.9* 2.6* 2.9*   Recent Labs  Lab 06/18/22 1255  LIPASE 75*   No results for input(s): "AMMONIA" in the last 168 hours. CBC: Recent Labs  Lab 06/18/22 1255 06/19/22 0307 06/20/22 0813 06/23/22 1517 06/24/22 0131 06/24/22 0957  WBC 6.6 6.6 5.6 5.6  --   --   NEUTROABS 5.0  --   --   --   --   --   HGB 11.4* 11.1* 11.1* 7.2* 11.7* 11.5*  HCT 36.1* 34.3* 34.0* 23.8* 37.6* 38.0*  MCV 85.7 84.9 84.8 91.2  --   --   PLT 233 162 211 131*  --   --    Cardiac Enzymes: Recent Labs  Lab 06/18/22 1255  CKTOTAL 120   BNP: Invalid input(s): "POCBNP" CBG: Recent Labs  Lab 06/20/22 0725  GLUCAP 73   D-Dimer No results for input(s): "DDIMER" in the last 72 hours. Hgb A1c No results for input(s): "HGBA1C" in the last 72 hours. Lipid Profile No results for input(s): "CHOL", "HDL", "LDLCALC", "TRIG", "CHOLHDL", "LDLDIRECT" in the last 72 hours. Thyroid function studies No results for input(s): "TSH", "T4TOTAL", "T3FREE", "THYROIDAB" in the last 72 hours.  Invalid input(s): "FREET3" Anemia work up No results for input(s): "VITAMINB12", "FOLATE", "FERRITIN", "TIBC", "IRON", "RETICCTPCT" in the last 72 hours. Urinalysis    Component Value Date/Time   COLORURINE YELLOW 06/18/2022 1448   APPEARANCEUR CLOUDY (A) 06/18/2022 1448   LABSPEC 1.013 06/18/2022 1448   PHURINE 5.0 06/18/2022 1448   GLUCOSEU NEGATIVE 06/18/2022 1448   HGBUR SMALL (A) 06/18/2022 1448   BILIRUBINUR NEGATIVE 06/18/2022 1448   KETONESUR 5 (A) 06/18/2022 1448   PROTEINUR 100 (A) 06/18/2022 1448   NITRITE NEGATIVE 06/18/2022 1448   LEUKOCYTESUR TRACE (A) 06/18/2022 1448   Sepsis Labs Recent Labs  Lab 06/18/22 1255 06/19/22 0307 06/20/22 0813 06/23/22 1517  WBC 6.6 6.6 5.6 5.6   Microbiology Recent Results (from  the past 240 hour(s))  Urine Culture (for pregnant, neutropenic or urologic patients or patients with an indwelling urinary catheter)     Status: None   Collection Time: 06/18/22  9:42 PM   Specimen: Urine, Clean Catch  Result Value Ref Range Status   Specimen Description URINE, CLEAN CATCH  Final   Special Requests NONE  Final   Culture   Final    NO GROWTH Performed at [facility], [place] [address] [place], [place] [address]    Report Status 06/19/2022 FINAL  Final    Procedures/Studies: US Renal  Result Date: 06/18/2022 CLINICAL DATA:  Acute kidney injury EXAM: RENAL / URINARY TRACT ULTRASOUND COMPLETE COMPARISON:  CT AP 01/18/19 FINDINGS: Right Kidney: Right kidney was not visualized. Left Kidney: Renal measurements: 11.0 x 5.8 x 5.1 cm = volume: 171 mL. Echogenicity is slightly increased. No mass or hydronephrosis visualized. Bladder: Appears normal for degree of bladder distention. Other: None. IMPRESSION: 1. Right kidney was not visualized. 2. Slightly increased echogenicity of the left kidney may be seen in medical renal disease. No hydronephrosis or  nephrolithiasis. Electronically Signed   By: Marin Roberts M.D.   On: 06/18/2022 15:29   DG Chest Port 1 View  Result Date: 06/18/2022 CLINICAL DATA:  Sepsis EXAM: PORTABLE CHEST 1 VIEW COMPARISON:  CXR 03/29/19 FINDINGS: No pleural effusion. No pneumothorax. Low lung volumes. Unchanged cardiac and mediastinal contours. No focal airspace opacity. No radiographically apparent displaced rib fractures. Visualized upper abdomen is unremarkable. IMPRESSION: Low lung volumes. No acute cardiopulmonary disease. Electronically Signed   By: Marin Roberts M.D.   On: 06/18/2022 14:28     Time coordinating discharge: Over 30 minutes    Dwyane Dee, MD  [facility] 06/24/2022, 11:46 AM

## 2022-06-24 NOTE — Consult Note (Signed)
[facility] Consult  Referring Provider: [facility] hospitalist Primary Care Physician:  Patient, No Pcp Per Primary Gastroenterologist: Althia Forts  Reason for Consultation: Hematemesis and dark stools  HPI: Philip Vasquez is a 62 y.o. male, a resident at [facility], admitted yesterday with multiple episodes of hematemesis and dark stools. Patient appeared lethargic, however was appropriately able to tell me the time place and person/orientation questions. He denies any abdominal pain, currently does not have any nausea or vomiting, last bowel movement was yesterday, described as black.  In the ER he was found to have a drop in his blood count from 11.1-7.2. He was recently admitted between 3/12 to 3/14 for dehydration, acute on chronic renal failure.  No prior EGD or colonoscopy documented. Home medication list baby aspirin, no other blood thinners/antiplatelets/anticoagulation.   Past Medical History:  Diagnosis Date   Chronic kidney disease, stage 3a ([place])    Cocaine use disorder ([place]) 12/21/2018   History of CVA with residual deficit    residual right side weakness   Normocytic anemia     No past surgical history on file.  Prior to Admission medications   Medication Sig Start Date End Date Taking? Authorizing Provider  acetaminophen (TYLENOL) 325 MG tablet Take 2 tablets (650 mg total) by mouth every 6 (six) hours as needed for mild pain (or Fever >/= 101). 06/20/22   Cherene Altes, MD  aspirin EC 81 MG EC tablet Take 1 tablet (81 mg total) by mouth daily. 06/15/19   Mitzi Hansen, MD  Cholecalciferol 25 MCG (1000 UT) TBDP Take 1,000 Units by mouth every morning.    [provider]  cyanocobalamin (VITAMIN B12) 1000 MCG tablet Take 1,000 mcg by mouth daily.    [provider]  FLUoxetine (PROZAC) 40 MG capsule Take 2 capsules (80 mg total) by mouth daily. 06/15/19   Mitzi Hansen, MD  folic acid (FOLVITE) 1 MG tablet Take 1 tablet (1 mg total)  by mouth daily. 06/15/19   Mitzi Hansen, MD  hydrocerin (EUCERIN) CREA Apply 1 application topically 2 (two) times daily. 06/14/19   Mitzi Hansen, MD  melatonin 3 MG TABS tablet Take 6 mg by mouth at bedtime.    [provider]  mirtazapine (REMERON) 15 MG tablet Take 1 tablet (15 mg total) by mouth at bedtime. Patient not taking: Reported on 06/19/2022 06/14/19   Mitzi Hansen, MD  Multiple Vitamin (MULTIVITAMIN WITH MINERALS) TABS tablet Take 1 tablet by mouth daily. 06/15/19   Mitzi Hansen, MD  omeprazole (PRILOSEC) 20 MG capsule Take 20 mg by mouth every morning.    [provider]  ondansetron (ZOFRAN) 4 MG tablet Take 4 mg by mouth every 8 (eight) hours as needed for nausea or vomiting.    [provider]  polyethylene glycol (MIRALAX / GLYCOLAX) 17 g packet Take 17 g by mouth daily. 06/15/19   Mitzi Hansen, MD  rosuvastatin (CRESTOR) 40 MG tablet Take 1 tablet (40 mg total) by mouth daily at 6 PM. 06/14/19   Christian, Rylee, MD  senna-docusate (SENOKOT-S) 8.6-50 MG tablet Take 2 tablets by mouth at bedtime. 06/14/19   Mitzi Hansen, MD  thiamine 100 MG tablet Take 1 tablet (100 mg total) by mouth daily. 06/15/19   Christian, Rylee, MD  urea (CARMOL) 20 % cream Apply 1 application  topically daily. Apply to feet topically every day shift for dryness.    [provider]    Current Facility-Administered Medications  Medication Dose Route Frequency Provider Last Rate  Last Admin   acetaminophen (TYLENOL) tablet 650 mg  650 mg Oral Q6H PRN Dwyane Dee, MD       Or   acetaminophen (TYLENOL) suppository 650 mg  650 mg Rectal Q6H PRN Dwyane Dee, MD       FLUoxetine (PROZAC) capsule 80 mg  80 mg Oral Daily Dwyane Dee, MD       lactated ringers infusion   Intravenous Continuous Dwyane Dee, MD 75 mL/hr at 06/23/22 2238 New Bag at 06/23/22 2238   mirtazapine (REMERON) tablet 15 mg  15 mg Oral Standley Brooking, MD   15 mg at 06/23/22 2232    [START ON 06/27/2022] pantoprazole (PROTONIX) injection 40 mg  40 mg Intravenous Q12H Dwyane Dee, MD       pantoprozole (PROTONIX) 80 mg /NS 100 mL infusion  8 mg/hr Intravenous Continuous Dwyane Dee, MD 10 mL/hr at 06/24/22 0600 8 mg/hr at 06/24/22 0600   sodium chloride flush (NS) 0.9 % injection 3 mL  3 mL Intravenous Eddie Candle, MD   3 mL at 06/23/22 2154    Allergies as of 06/23/2022   (No Known Allergies)    No family history on file.  Social History   Socioeconomic History   Marital status: Single    Spouse name: Not on file   Number of children: Not on file   Years of education: Not on file   Highest education level: Not on file  Occupational History   Not on file  Tobacco Use   Smoking status: Never   Smokeless tobacco: Never  Substance and Sexual Activity   Alcohol use: Yes    Comment: 2 cans of beer (natural lite)/day   Drug use: Never   Sexual activity: Not on file  Other Topics Concern   Not on file  Social History Narrative   Not on file   Social Determinants of Health   Financial Resource Strain: Not on file  Food Insecurity: Not on file  Transportation Needs: Not on file  Physical Activity: Not on file  Stress: Not on file  Social Connections: Not on file  Intimate Partner Violence: Not on file    Review of Systems: As per HPI  Physical Exam: Vital signs in last 24 hours: Temp:  [98.2 F (36.8 C)-100 F (37.8 C)] 98.2 F (36.8 C) (03/18 0436) Pulse Rate:  [90-113] 90 (03/18 0436) Resp:  [18-32] 18 (03/18 0436) BP: (111-134)/(71-90) 126/88 (03/18 0436) SpO2:  [97 %-100 %] 100 % (03/18 0436) Weight:  [81.9 kg-82 kg] 81.9 kg (03/18 0436) Last BM Date :  (unsure per pt)  General:   Alert,  Well-developed, well-nourished,  NAD Head:  Normocephalic and atraumatic. Eyes:  Sclera clear, no icterus.   Mild pallor Ears:  Normal auditory acuity. Nose:  No deformity, discharge,  or lesions. Mouth:  No deformity or lesions.   Oropharynx pink & moist. Neck:  Supple; no masses or thyromegaly. Lungs:  Clear throughout to auscultation.   No wheezes, crackles, or rhonchi. No acute distress. Heart:  Regular rate and rhythm; no murmurs, clicks, rubs,  or gallops. Extremities: Right-sided weakness Neurologic: Lethargic but follows commands and and  oriented x4 Skin:  Intact without significant lesions or rashes. Psych:  Alert and cooperative. Normal mood and affect. Abdomen:  Soft, nontender and nondistended. No masses, hepatosplenomegaly or hernias noted. Normal bowel sounds, without guarding, and without rebound.         Lab Results: Recent Labs    06/23/22 1517  06/24/22 0131  WBC 5.6  --   HGB 7.2* 11.7*  HCT 23.8* 37.6*  PLT 131*  --    BMET Recent Labs    06/23/22 1517 06/24/22 0507  NA 146* 145  K 4.1 3.5  CL 118* 112*  CO2 25 23  GLUCOSE 111* 78  BUN 46* 37*  CREATININE 2.17* 2.24*  CALCIUM 8.3* 8.3*   LFT Recent Labs    06/23/22 1517  PROT 6.2*  ALBUMIN 2.9*  AST 28  ALT 27  ALKPHOS 66  BILITOT 0.5   PT/INR Recent Labs    06/23/22 1517  LABPROT 13.8  INR 1.1    Studies/Results: No results found.  Impression: Hematemesis, dark stool, drop in hemoglobin, elevated BUN, FOBT positive stool Status post 2 unit PRBC transfusion Hemodynamically stable, blood pressure 134/81, pulse rate 77, respiratory 20, saturating 99% on room air Has received Protonix 80 mg IV x 1 dose yesterday and is on Protonix drip at 8 mg/h IV   Multiple comorbidities-CVA with residual right-sided weakness, chronic kidney disease, normocytic anemia, depression, dyslipidemia, history of substance abuse(cocaine positive in 2020)  Plan: Diagnostic EGD now. The risks and the benefits of the procedure were discussed with the patient in details. He understands and verbalizes consent.   LOS: 1 day   Ronnette Juniper, MD  06/24/2022, 7:35 AM

## 2022-06-24 NOTE — Anesthesia Procedure Notes (Signed)
Procedure Name: MAC Date/Time: 06/24/2022 8:30 AM  Performed by: Eben Burow, CRNAPre-anesthesia Checklist: Patient identified, Emergency Drugs available, Suction available, Patient being monitored and Timeout performed Oxygen Delivery Method: Simple face mask Placement Confirmation: positive ETCO2

## 2022-06-24 NOTE — TOC Transition Note (Addendum)
Transition of Care [facility]) - CM/SW Discharge Note   Patient Details  Name: Philip Vasquez MRN: [id number] Date of Birth: February 23, 1961  Transition of Care [facility]) CM/SW Contact:  Dessa Phi, RN Phone Number: 06/24/2022, 10:35 AM   Clinical Narrative: from [facility] confirmed;can accept back today, no fl2 needed. D/c summary to be sent prior rm#,report tel#. [facility] for transport.No further CM needs.  - 12:55p-Returning back to [facility] aware.d/c summary sent.going to rm#[id number],report tel#[phone]. [facility] called. No further CM needs.    Final next level of care: Long Term Nursing Home Barriers to Discharge: No Barriers Identified   Patient Goals and CMS Choice CMS Medicare.gov Compare Post Acute Care list provided to:: Patient Choice offered to / list presented to : Patient  Discharge Placement                         Discharge Plan and Services Additional resources added to the After Visit Summary for     Discharge Planning Services: CM Consult Post Acute Care Choice: Nursing Home                               Social Determinants of Health (SDOH) Interventions SDOH Screenings   Tobacco Use: Low Risk  (06/24/2022)     Readmission Risk Interventions     No data to display

## 2022-06-24 NOTE — Anesthesia Preprocedure Evaluation (Addendum)
Anesthesia Evaluation  Patient identified by MRN, date of birth, ID band Patient confused    Reviewed: Allergy & Precautions, NPO status , Patient's Chart, lab work & pertinent test results  Airway Mallampati: II  TM Distance: >3 FB Neck ROM: Full    Dental  (+) Dental Advisory Given, Poor Dentition, Missing, Loose   Pulmonary neg pulmonary ROS   Pulmonary exam normal breath sounds clear to auscultation       Cardiovascular hypertension, Normal cardiovascular exam Rhythm:Regular Rate:Normal     Neuro/Psych  PSYCHIATRIC DISORDERS  Depression    CVA, Residual Symptoms    GI/Hepatic ,GERD  Medicated,,(+)     substance abuse  cocaine use  Endo/Other  negative endocrine ROS    Renal/GU Renal InsufficiencyRenal disease     Musculoskeletal negative musculoskeletal ROS (+)    Abdominal   Peds  Hematology  (+) Blood dyscrasia (Thrombocytopenia), anemia   Anesthesia Other Findings Day of surgery medications reviewed with the patient.  Reproductive/Obstetrics                             Anesthesia Physical Anesthesia Plan  ASA: 3  Anesthesia Plan: MAC   Post-op Pain Management: Minimal or no pain anticipated   Induction: Intravenous  PONV Risk Score and Plan: 1 and TIVA and Treatment may vary due to age or medical condition  Airway Management Planned: Natural Airway and Simple Face Mask  Additional Equipment:   Intra-op Plan:   Post-operative Plan:   Informed Consent: I have reviewed the patients History and Physical, chart, labs and discussed the procedure including the risks, benefits and alternatives for the proposed anesthesia with the patient or authorized representative who has indicated his/her understanding and acceptance.     Dental advisory given  Plan Discussed with: CRNA  Anesthesia Plan Comments:        Anesthesia Quick Evaluation

## 2022-06-24 NOTE — Transfer of Care (Signed)
Immediate Anesthesia Transfer of Care Note  Patient: Philip Vasquez  Procedure(s) Performed: ESOPHAGOGASTRODUODENOSCOPY (EGD) WITH PROPOFOL BIOPSY  Patient Location: PACU  Anesthesia Type:MAC  Level of Consciousness: drowsy and patient cooperative  Airway & Oxygen Therapy: Patient Spontanous Breathing and Patient connected to face mask oxygen  Post-op Assessment: Report given to RN and Post -op Vital signs reviewed and stable  Post vital signs: Reviewed and stable  Last Vitals:  Vitals Value Taken Time  BP 108/83 06/24/22 0851  Temp    Pulse 88 06/24/22 0853  Resp 15 06/24/22 0853  SpO2 100 % 06/24/22 0853  Vitals shown include unvalidated device data.  Last Pain:  Vitals:   06/24/22 0806  TempSrc: Temporal  PainSc: Asleep         Complications: No notable events documented.

## 2022-06-24 NOTE — Progress Notes (Signed)
RN asked questions to pt when assessed, but pt mostly tended to nod or shake head instead of answering the questions. Unable to complete all admission questions.

## 2022-06-25 ENCOUNTER — Encounter (HOSPITAL_COMMUNITY): Payer: Self-pay | Admitting: Gastroenterology

## 2022-06-25 NOTE — Anesthesia Postprocedure Evaluation (Signed)
Anesthesia Post Note  Patient: Philip Vasquez  Procedure(s) Performed: ESOPHAGOGASTRODUODENOSCOPY (EGD) WITH PROPOFOL BIOPSY     Patient location during evaluation: Endoscopy Anesthesia Type: MAC Level of consciousness: oriented, awake and alert and awake Pain management: pain level controlled Vital Signs Assessment: post-procedure vital signs reviewed and stable Respiratory status: spontaneous breathing, nonlabored ventilation, respiratory function stable and patient connected to nasal cannula oxygen Cardiovascular status: blood pressure returned to baseline and stable Postop Assessment: no headache, no backache and no apparent nausea or vomiting Anesthetic complications: no   No notable events documented.  Last Vitals:  Vitals:   06/24/22 0932 06/24/22 1336  BP: 129/87 115/80  Pulse: 88 96  Resp: 16 18  Temp: 36.7 C 36.8 C  SpO2: 100% 100%    Last Pain:  Vitals:   06/24/22 1003  TempSrc:   PainSc: 0-No pain   Pain Goal:                   Santa Lighter

## 2022-06-27 LAB — SURGICAL PATHOLOGY

## 2022-08-14 ENCOUNTER — Non-Acute Institutional Stay: Payer: Medicaid Other | Admitting: Family Medicine

## 2022-08-14 ENCOUNTER — Encounter: Payer: Self-pay | Admitting: Family Medicine

## 2022-08-14 VITALS — BP 150/80 | HR 79 | Temp 98.0°F | Resp 16

## 2022-08-14 DIAGNOSIS — Z91148 Patient's other noncompliance with medication regimen for other reason: Secondary | ICD-10-CM

## 2022-08-14 DIAGNOSIS — F339 Major depressive disorder, recurrent, unspecified: Secondary | ICD-10-CM

## 2022-08-14 DIAGNOSIS — N183 Chronic kidney disease, stage 3 unspecified: Secondary | ICD-10-CM

## 2022-08-14 DIAGNOSIS — I10 Essential (primary) hypertension: Secondary | ICD-10-CM

## 2022-08-14 DIAGNOSIS — E8809 Other disorders of plasma-protein metabolism, not elsewhere classified: Secondary | ICD-10-CM

## 2022-08-14 NOTE — Progress Notes (Signed)
Therapist, nutritional Palliative Care Consult Note Telephone: [phone]  Fax: [phone]   Date of encounter: 08/14/22 12:06 PM PATIENT NAME: Philip Vasquez [address] [place] [address]   [phone] (home)  DOB: Nov 07, 1960 MRN: [id number] PRIMARY CARE PROVIDER:    Patient, No Pcp Per,  No address on file None  REFERRING PROVIDER:   No referring provider defined for this encounter. N/A  Emergency Contact:    Contact Information     Name Relation Home Work Philip Vasquez [phone]     Philip Vasquez   [phone]       Health Care Agent-pt verbalizes his brother   I met face to face with patient in [facility] facility. Palliative Care was asked to follow this patient by consultation request of No ref. provider found to address advance care planning and complex medical decision making. This is an initial visit.  ADVANCE CARE PLANNING/GOALS OF CARE:  PRESENT FOR DISCUSSION: patient GOALS: "I want to walk." BARRIERS: Pt has continued incongruent affect stating he is doing well but conversation is stilted, clipped and primarily yes or no answers. Says he is doing well but continues not to eat well and refuse some care per facility staff.    CODE STATUS: Currently full code  ASSESSMENT AND / RECOMMENDATIONS:  PPS: 30%  Recurrent depression Resistive to care, therapy and medication. Question if pt has capacity, think that he does but is refusing care. Question need for involuntary commitment if he meets criteria for commitment and may need. Expressed goal of full code incompatible with refusing to eat and drink, take meds. Noted self neglect behaviors which may constitute self harm. Has Prozac 80 mg ordered. Reported continued alcohol intake but has not been given any alcohol in facility and doesn't get OOB.  No tox screen done in ED (last tox screen 2021 was positive for cocaine).   Primary  Hypertension Has no meds for hypertension but currently refusing all meds. May benefit from low dose CCB   Hypoalbuminemia due to protein calorie        Malnutrition Last albumin 2.6-2.9 on 3/14 & 17/24. Has refused Mirtazapine previously which would help with appetite stimulation and depression.  4.   Stage 3 CKD Last Cr improved to 2.24. Encouraged to increase fluid intake to at least 32-48 oz daily and educated that this would be 3.5-4 of current cups Recommend optimizing BP control if pt cooperative.  5.   Noncompliance with diet and medication         regimen Related to number 1   Follow up Palliative Care Visit:  Palliative Care continuing to follow up by monitoring for changes in appetite, weight, functional and cognitive status for chronic disease progression and management in agreement with patient's stated goals of care. Next visit in 3-4 weeks or prn.  This visit was coded based on medical decision making (MDM).  Chief Complaint  [facility] received a referral to follow up with patient for chronic disease management in setting of recent acute on chronic renal failure due to refusing to eat and drink.  Hx of CVA with right sided residual deficit.  Palliative Care is also following to assist with advance directive planning and defining/refining goals of care.   HISTORY OF PRESENT ILLNESS: Philip Vasquez is a 62 y.o. year old male with hx of CVA with residual right sided deficit who has self reported hx of cocaine and alcohol use.  Approximately 5-6  weeks ago pt was admitted to the hospital with acute GI bleed, vomiting blood 3-4 times prior to admission.  At that time he was residing at [facility] and reported to ED provider significant alcohol intake. He was transfused and noted to have multiple shallow gastric ulcers and 2 duodenal shallow ulcers. He was started on Protonix BID.  He was transfused.  Just prior to that he was noted to be refusing  food and fluids with a significant kidney injury and rise in his Cr from baseline 1.5-1.7 to 10.88.  He was treated with IV fluids and was cooperative while inpatient but ED provider documented "odd affect".  Staff cannot say if he is currently drinking but he has been refusing some care. Pt denies nausea/vomiting, hematemesis, bright red or dark, tarry stools.  Denies constipation.  Says "I want to get out of this bed."  States having very little appetite, eating approximately 25% of 2 meals, drinking about 24 oz of fluid daily.  Denies weight loss, indigestion and reports compliance with Protonix.  Reports having no indigestion/heartburn prior to GI bleed either.  Denies feeling down, depressed or hopeless. Denies pain, falls.  Facility nurse states that pt refuses blood pressure medication, refuses to eat and although his brother brings him something to eat daily he refuses to eat that as well.  She states PT tried to work with him but he refused to get OOB with them.  He also refused to work with Psychiatry.  Previously he was taking medications crushed, now he won't even take that per facility staff.                                                                                                                                                                                                                                     ACTIVITIES OF DAILY LIVING: CONTINENT OF BLADDER/BOWEL      No BATHING/DRESSING/FEEDING: total care per facility staff with bathing and dressing, independent with feeding  MOBILITY: Wheelchair  APPETITE? poor WEIGHT: 180 lbs 8.9 oz on 06/24/22, Prior weight on 06/15/19 was 270.6  CURRENT PROBLEM LIST:  Patient Active Problem List   Diagnosis Date Noted   GIB (gastrointestinal bleeding) 06/23/2022   Thrombocytopenia (HCC) 06/23/2022   Disturbance in affect 06/23/2022   Acute renal failure superimposed on stage 3b chronic kidney disease (HCC) 06/18/2022   Transient hypotension  06/18/2022   Metabolic acidosis with  increased anion gap and accumulation of organic acids 06/18/2022   Normocytic anemia 06/18/2022   History of CVA with residual deficit 06/18/2022   DNR (do not resuscitate)    Palliative care by specialist    Goals of care, counseling/discussion    Depression, recurrent (HCC)    Concerned about having social problem    Muscular weakness    Urinary tract infection 03/31/2019   Anemia of chronic disease 01/27/2019   Depression    Hypertension 12/21/2018   Hyperlipidemia 12/21/2018   Cerebral thrombosis with cerebral infarction 12/20/2018   Right sided weakness 12/19/2018   PAST MEDICAL HISTORY:  Active Ambulatory Problems    Diagnosis Date Noted   Right sided weakness 12/19/2018   Cerebral thrombosis with cerebral infarction 12/20/2018   Hypertension 12/21/2018   Hyperlipidemia 12/21/2018   Depression    Anemia of chronic disease 01/27/2019   Urinary tract infection 03/31/2019   Palliative care by specialist    Goals of care, counseling/discussion    Depression, recurrent (HCC)    Concerned about having social problem    Muscular weakness    DNR (do not resuscitate)    Acute renal failure superimposed on stage 3b chronic kidney disease (HCC) 06/18/2022   Transient hypotension 06/18/2022   Metabolic acidosis with increased anion gap and accumulation of organic acids 06/18/2022   Normocytic anemia 06/18/2022   History of CVA with residual deficit 06/18/2022   GIB (gastrointestinal bleeding) 06/23/2022   Thrombocytopenia (HCC) 06/23/2022   Disturbance in affect 06/23/2022   Resolved Ambulatory Problems    Diagnosis Date Noted   Cocaine use disorder (HCC) 12/21/2018   Ulcer of penis    Urinary retention    Generalized abdominal pain    Hematuria    Iron deficiency anemia 01/27/2019   Acute blood loss anemia    Past Medical History:  Diagnosis Date   Chronic kidney disease, stage 3a (HCC)    SOCIAL HX:  Social History   Tobacco  Use   Smoking status: Never   Smokeless tobacco: Never  Substance Use Topics   Alcohol use: Yes    Comment: 2 cans of beer (natural lite)/day   FAMILY HX: No family history on file.     Preferred Pharmacy: ALLERGIES: No Known Allergies   PERTINENT MEDICATIONS:  Outpatient Encounter Medications as of 08/14/2022  Medication Sig   acetaminophen (TYLENOL) 325 MG tablet Take 2 tablets (650 mg total) by mouth every 6 (six) hours as needed for mild pain (or Fever >/= 101).   aspirin EC 81 MG EC tablet Take 1 tablet (81 mg total) by mouth daily.   Cholecalciferol 25 MCG (1000 UT) TBDP Take 1,000 Units by mouth every morning.   cyanocobalamin (VITAMIN B12) 1000 MCG tablet Take 1,000 mcg by mouth daily.   FLUoxetine (PROZAC) 40 MG capsule Take 2 capsules (80 mg total) by mouth daily.   folic acid (FOLVITE) 1 MG tablet Take 1 tablet (1 mg total) by mouth daily.   hydrocerin (EUCERIN) CREA Apply 1 application topically 2 (two) times daily.   melatonin 3 MG TABS tablet Take 6 mg by mouth at bedtime.   mirtazapine (REMERON) 15 MG tablet Take 1 tablet (15 mg total) by mouth at bedtime. (Patient not taking: Reported on 06/19/2022)   Multiple Vitamin (MULTIVITAMIN WITH MINERALS) TABS tablet Take 1 tablet by mouth daily.   ondansetron (ZOFRAN) 4 MG tablet Take 4 mg by mouth every 8 (eight) hours as needed for nausea or vomiting.   pantoprazole (  PROTONIX) 40 MG tablet 1 tablet BID for 2 months then daily indefinitely   polyethylene glycol (MIRALAX / GLYCOLAX) 17 g packet Take 17 g by mouth daily.   rosuvastatin (CRESTOR) 40 MG tablet Take 1 tablet (40 mg total) by mouth daily at 6 PM.   senna-docusate (SENOKOT-S) 8.6-50 MG tablet Take 2 tablets by mouth at bedtime.   thiamine 100 MG tablet Take 1 tablet (100 mg total) by mouth daily.   urea (CARMOL) 20 % cream Apply 1 application  topically daily. Apply to feet topically every day shift for dryness.   No facility-administered encounter medications on file  as of 08/14/2022.    History obtained from review of EMR, discussion with facility staff/caregiver and/or patient.   CBC    Component Value Date/Time   WBC 5.6 06/23/2022 1517   RBC 2.61 (L) 06/23/2022 1517   HGB 11.2 (L) 06/24/2022 1220   HCT 36.8 (L) 06/24/2022 1220   PLT 131 (L) 06/23/2022 1517   MCV 91.2 06/23/2022 1517   MCH 27.6 06/23/2022 1517   MCHC 30.3 06/23/2022 1517   RDW 16.7 (H) 06/23/2022 1517   LYMPHSABS 1.0 06/18/2022 1255   MONOABS 0.5 06/18/2022 1255   EOSABS 0.1 06/18/2022 1255   BASOSABS 0.0 06/18/2022 1255    CMP    Latest Ref Rng & Units 06/24/2022    5:07 AM 06/23/2022    3:17 PM 06/20/2022    8:13 AM  CMP  Glucose 70 - 99 mg/dL 78  161  91   BUN 8 - 23 mg/dL 37  46  78   Creatinine 0.61 - 1.24 mg/dL 0.96  0.45  4.09   Sodium 135 - 145 mmol/L 145  146  146   Potassium 3.5 - 5.1 mmol/L 3.5  4.1  4.1   Chloride 98 - 111 mmol/L 112  118  114   CO2 22 - 32 mmol/L 23  25  21    Calcium 8.9 - 10.3 mg/dL 8.3  8.3  8.5   Total Protein 6.5 - 8.1 g/dL  6.2    Total Bilirubin 0.3 - 1.2 mg/dL  0.5    Alkaline Phos 38 - 126 U/L  66    AST 15 - 41 U/L  28    ALT 0 - 44 U/L  27      LFTs    Latest Ref Rng & Units 06/23/2022    3:17 PM 06/20/2022    8:13 AM 06/18/2022   12:55 PM  Hepatic Function  Total Protein 6.5 - 8.1 g/dL 6.2   6.3   Albumin 3.5 - 5.0 g/dL 2.9  2.6  2.9   AST 15 - 41 U/L 28   13   ALT 0 - 44 U/L 27   16   Alk Phosphatase 38 - 126 U/L 66   77   Total Bilirubin 0.3 - 1.2 mg/dL 0.5   0.5     Urinalysis    Component Value Date/Time   COLORURINE YELLOW 06/18/2022 1448   APPEARANCEUR CLOUDY (A) 06/18/2022 1448   LABSPEC 1.013 06/18/2022 1448   PHURINE 5.0 06/18/2022 1448   GLUCOSEU NEGATIVE 06/18/2022 1448   HGBUR SMALL (A) 06/18/2022 1448   BILIRUBINUR NEGATIVE 06/18/2022 1448   KETONESUR 5 (A) 06/18/2022 1448   PROTEINUR 100 (A) 06/18/2022 1448   NITRITE NEGATIVE 06/18/2022 1448   LEUKOCYTESUR TRACE (A) 06/18/2022 1448    BNP  (last 3 results) Recent Labs    06/18/22 1255  BNP 14.6  I reviewed available labs, medications, imaging, studies and related documents from the EMR. Records reviewed and summarized above.   Physical Exam: GENERAL: NAD LUNGS: CTAB, no increased work of breathing, room air CARDIAC:  S1S2, RRR with no MRG, no edema/cyanosis ABD:  Normo-active BS x 4 quads, soft, non-tender EXTREMITIES: No deformity, No muscle atrophy/subcutaneous fat loss SKIN:  Dry heels with significant flaking skin NEURO:  No cognitive impairment, Right sided weakness PSYCH:  Blunted affect, A & O x 3  Thank you for the opportunity to participate in the care of [employer]. Please call our main office at [phone] if we can be of additional assistance.    Joycelyn Man FNP-C  [email or web address] .[facility]  Phone:  [phone]
